# Patient Record
Sex: Male | Born: 1965 | Race: Black or African American | Hispanic: No | Marital: Single | Smoking: Former smoker
Health system: Southern US, Community
[De-identification: ages and names within clinical notes are randomized; demographics above are authoritative.]

## PROBLEM LIST (undated history)

## (undated) DIAGNOSIS — N189 Chronic kidney disease, unspecified: Secondary | ICD-10-CM

## (undated) DIAGNOSIS — F329 Major depressive disorder, single episode, unspecified: Secondary | ICD-10-CM

## (undated) DIAGNOSIS — I499 Cardiac arrhythmia, unspecified: Secondary | ICD-10-CM

## (undated) DIAGNOSIS — F419 Anxiety disorder, unspecified: Secondary | ICD-10-CM

## (undated) DIAGNOSIS — E349 Endocrine disorder, unspecified: Secondary | ICD-10-CM

## (undated) DIAGNOSIS — IMO0001 Reserved for inherently not codable concepts without codable children: Secondary | ICD-10-CM

## (undated) DIAGNOSIS — M1990 Unspecified osteoarthritis, unspecified site: Secondary | ICD-10-CM

## (undated) DIAGNOSIS — I519 Heart disease, unspecified: Secondary | ICD-10-CM

## (undated) DIAGNOSIS — K759 Inflammatory liver disease, unspecified: Secondary | ICD-10-CM

## (undated) DIAGNOSIS — G4730 Sleep apnea, unspecified: Secondary | ICD-10-CM

## (undated) DIAGNOSIS — R569 Unspecified convulsions: Secondary | ICD-10-CM

## (undated) DIAGNOSIS — G5600 Carpal tunnel syndrome, unspecified upper limb: Secondary | ICD-10-CM

## (undated) DIAGNOSIS — F32A Depression, unspecified: Secondary | ICD-10-CM

## (undated) DIAGNOSIS — E785 Hyperlipidemia, unspecified: Secondary | ICD-10-CM

## (undated) DIAGNOSIS — Z973 Presence of spectacles and contact lenses: Secondary | ICD-10-CM

## (undated) DIAGNOSIS — K219 Gastro-esophageal reflux disease without esophagitis: Secondary | ICD-10-CM

## (undated) DIAGNOSIS — G63 Polyneuropathy in diseases classified elsewhere: Secondary | ICD-10-CM

## (undated) DIAGNOSIS — K5900 Constipation, unspecified: Secondary | ICD-10-CM

## (undated) DIAGNOSIS — R011 Cardiac murmur, unspecified: Secondary | ICD-10-CM

## (undated) DIAGNOSIS — I10 Essential (primary) hypertension: Secondary | ICD-10-CM

## (undated) HISTORY — DX: Hyperlipidemia, unspecified: E78.5

## (undated) HISTORY — PX: COLONOSCOPY: SHX174

## (undated) HISTORY — PX: CATARACT EXTRACTION: SUR2

## (undated) HISTORY — PX: PROSTATE BIOPSY: SHX241

## (undated) HISTORY — DX: Chronic kidney disease, unspecified: N18.9

---

## 1998-04-06 HISTORY — PX: KNEE ARTHROSCOPY W/ MENISCAL REPAIR: SHX1877

## 1999-08-25 ENCOUNTER — Ambulatory Visit (HOSPITAL_COMMUNITY): Admission: RE | Admit: 1999-08-25 | Discharge: 1999-08-25 | Payer: Self-pay | Admitting: Neurosurgery

## 1999-08-25 ENCOUNTER — Encounter: Payer: Self-pay | Admitting: Neurosurgery

## 1999-09-08 ENCOUNTER — Encounter: Payer: Self-pay | Admitting: Neurosurgery

## 1999-09-08 ENCOUNTER — Ambulatory Visit (HOSPITAL_COMMUNITY): Admission: RE | Admit: 1999-09-08 | Discharge: 1999-09-08 | Payer: Self-pay | Admitting: Neurosurgery

## 1999-11-10 ENCOUNTER — Encounter: Payer: Self-pay | Admitting: Neurosurgery

## 1999-11-12 ENCOUNTER — Observation Stay (HOSPITAL_COMMUNITY): Admission: RE | Admit: 1999-11-12 | Discharge: 1999-11-13 | Payer: Self-pay | Admitting: Neurosurgery

## 1999-11-12 ENCOUNTER — Encounter: Payer: Self-pay | Admitting: Neurosurgery

## 2000-04-06 HISTORY — PX: LUMBAR LAMINECTOMY: SHX95

## 2002-12-04 ENCOUNTER — Encounter: Admission: RE | Admit: 2002-12-04 | Discharge: 2003-03-04 | Payer: Self-pay | Admitting: Family Medicine

## 2003-04-04 ENCOUNTER — Encounter (INDEPENDENT_AMBULATORY_CARE_PROVIDER_SITE_OTHER): Payer: Self-pay | Admitting: Specialist

## 2003-04-04 ENCOUNTER — Ambulatory Visit (HOSPITAL_COMMUNITY): Admission: RE | Admit: 2003-04-04 | Discharge: 2003-04-04 | Payer: Self-pay | Admitting: Internal Medicine

## 2003-12-28 ENCOUNTER — Ambulatory Visit: Payer: Self-pay | Admitting: *Deleted

## 2007-04-18 ENCOUNTER — Ambulatory Visit: Payer: Self-pay | Admitting: Gastroenterology

## 2007-06-02 ENCOUNTER — Encounter: Admission: RE | Admit: 2007-06-02 | Discharge: 2007-06-02 | Payer: Self-pay | Admitting: Orthopedic Surgery

## 2007-06-22 ENCOUNTER — Ambulatory Visit (HOSPITAL_COMMUNITY): Admission: RE | Admit: 2007-06-22 | Discharge: 2007-06-22 | Payer: Self-pay | Admitting: Orthopedic Surgery

## 2007-07-12 ENCOUNTER — Encounter: Admission: RE | Admit: 2007-07-12 | Discharge: 2007-07-28 | Payer: Self-pay | Admitting: Orthopedic Surgery

## 2008-11-08 ENCOUNTER — Ambulatory Visit: Payer: Self-pay | Admitting: Gastroenterology

## 2008-11-14 ENCOUNTER — Ambulatory Visit (HOSPITAL_COMMUNITY): Admission: RE | Admit: 2008-11-14 | Discharge: 2008-11-14 | Payer: Self-pay | Admitting: Gastroenterology

## 2008-12-11 ENCOUNTER — Ambulatory Visit: Payer: Self-pay | Admitting: Gastroenterology

## 2009-05-14 ENCOUNTER — Ambulatory Visit: Payer: Self-pay | Admitting: Psychiatry

## 2009-09-17 ENCOUNTER — Encounter: Admission: RE | Admit: 2009-09-17 | Discharge: 2009-09-17 | Payer: Self-pay | Admitting: Gastroenterology

## 2010-07-12 LAB — CREATININE, SERUM
Creatinine, Ser: 0.66 mg/dL (ref 0.4–1.5)
GFR calc Af Amer: >60 mL/min (ref >60)
GFR calc non Af Amer: >60 mL/min (ref >60)

## 2010-07-12 LAB — BUN: BUN: 8 mg/dL (ref 6–23)

## 2010-08-19 NOTE — Op Note (Signed)
NAME:  [name], [name]               ACCOUNT NO.:  [id number]   MEDICAL RECORD NO.:  [id number]          PATIENT TYPE:  AMB   LOCATION:  SDS                          FACILITY:  [facility]   PHYSICIAN:  Marily Memos, MD      DATE OF BIRTH:  June 08, 1965   DATE OF PROCEDURE:  06/22/2007  DATE OF DISCHARGE:                               OPERATIVE REPORT   PREOPERATIVE DIAGNOSIS:  Internal derangement left knee.   POSTOPERATIVE DIAGNOSIS:  Loose bodies left knee, chondral defect medial  and lateral femoral condyle, fraying of lateral meniscus and chronic  synovitis with a patella plica.   SURGEON:  Marily Memos, MD   ANESTHESIA:  General.   PROCEDURE:  Arthroscopic complete synovectomy and excision of plica,  chondroplasty of the lateral femoral condyle and medial femoral condyle  and removal of loose bodies.   PROCEDURE IN DETAIL:  The patient was taken to the operating room after  given adequate preop medications and given general anesthesia,  intubated.  The left knee was prepped with DuraPrep and draped in a  sterile manner.  Tourniquet used for hemostasis.  One half inch puncture  wound made along the anterior medial and lateral joint lines.  Inflow  was through the medial and suprapatellar pouch area.  Inspection of the  joint revealed a hyperemic synovial lining both medial and lateral  compartment with large plica anterior.  Chondral defect involving the  medial border to medial femoral condyle and the patellofemoral surface  of the femur about the lateral condyle.  The common defect in the  lateral femoral condyle was approximately the size of a 25 cent piece.  With the synovial shaver complete synovectomy was done with excision of  the patellar plica.  Loose bodies about the lateral compartment was also  identified and removed.  Inspection of the medial meniscus revealed it  was well-preserved.  There was a tear of the medial border of the medial  femoral condyle.  This was  shaved and then smoothed down.  Inspection of  the lateral meniscus only revealed fraying about the peripheral edge of  the medial border of the lateral meniscus.  ACL was intact.  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  Chondroplasty was done of the lateral femoral condylar defect as  well as the medial femoral condylar defect.  Further inspection did not  reveal any other loose fragments.  The joint was then closed with 4-0  nylon and 20 mL of quarter percent Marcaine was placed into the joint.  Compressive dressing was applied.  The patient tolerated the procedure  quite well and went to the recovery room on Percocet one to two q. 4  p.r.n. for pain and partial weightbearing left knee with use of  crutches, ice packs and elevation.  Return to the office in 1 week.  The  patient is being discharged in stable and satisfactory condition.      Marily Memos, MD  Electronically Signed     AC/MEDQ  D:  06/22/2007  T:  06/22/2007  Job:  [id number] 

## 2010-08-22 NOTE — H&P (Signed)
[place]. [facility]  Patient:    [name], [name]                      MRN: [id number] Adm. Date:  [id number] Attending:  Ashok Pall                         History and Physical  ADMISSION DIAGNOSIS:  Left lateral disk herniation L5-S1.  HISTORY OF PRESENT ILLNESS:  [name] is a 45 year old gentleman who while bending over the cash register July 14, 1999, had extreme and acute pain in the left lower extremity and buttocks. The pain is still quite significant. He initially presented to my office Aug 07, 1999. The pain feels more like a cramp. He had already been treated with a prednisone taper, hydrocodone, and Skelaxin. An MRI was done which showed a lateral disk herniation at L5-S1 on the left. [name] decided at that point in time to undergo epidural steroid injections as a first attempt to treat the pain. They, unfortunately, were of no benefit. He was taking 30 mg of OxyContin a day, and I felt that that was not a tenable solution. At that point in time, I recommended and he agreed to undergo a lumbar laminectomy and diskectomy at L5-S1 for a lateral disk.  PAST MEDICAL HISTORY:  Excellent.  REVIEW OF SYSTEMS:  Positive for eye glasses, leg pain, constipation, leg weakness, difficulty concentrating. He denies constitutional, cardiovascular, respiratory, gastrointestinal, genitourinary, skin, neurological, psychiatric, endocrine, or hematologic problems.  FAMILY HISTORY:  Father age 35 is deceased. He did have heart disease, emphysema, asthma, an enlarged liver, and an enlarged heart. Mother has diabetes and Addisons disease.  SOCIAL HISTORY:  He smokes one pack of cigarettes a day. Drinks alcohol socially.  PHYSICAL EXAMINATION:  GENERAL:  He is alert and oriented x 4 and answers all questions appropriately.  VITAL SIGNS:  He is 5 feet 11 inches tall, weighs 290 pounds.  NEUROLOGICAL:  Deep tendon reflexes 2+ at the knees, 1+ at the  ankles bilaterally. Positive straight leg raising on the left side. Negative Patricks maneuver. Pinprick decreased on the left L5 distribution.  EXTREMITIES:  Good strength in the lower extremities. He could toe walk, heel walk, do deep knee bends.  IMPRESSION:  Displaced disk L5-S1.  PLAN:  Risks of the procedure including bleeding, infection, no pain relief, worsened pain, need for fusion, leg weakness, CSF leak, recurrent disk work were explained. He understands and wishes to proceed.DD:  11/12/99 TD:  11/12/99 Job: [id number]

## 2010-08-22 NOTE — Op Note (Signed)
[place]. [facility], [place]  Patient:    JAHMAIR, CARRAHER                      MRN: [id number] Proc. Date: 11/12/99 Adm. Date:  [id number] Attending:  Ashok Pall                           Operative Report  PREOPERATIVE DIAGNOSES: 1. Lateral displaced disk, left, L5-S1. 2. Left L5 radiculopathy.  POSTOPERATIVE DIAGNOSES: 1. Lateral displaced disk, left, L5-S1. 2. Left L5 radiculopathy.  PROCEDURE:  Far lateral diskectomy L5-S1 with superior facetectomy L5-S1 and microdissection.  SURGEON:  Kyle L. Christella Noa, M.D.  ASSISTANT:  Hosie Spangle, M.D.  COMPLICATIONS:  None.  ANESTHESIA:  General endotracheal.  INDICATIONS FOR PROCEDURE:  Dwayne Faye is a 45 year old gentleman who has had pain in his left lower extremity since July 14, 1999.  The pain did not subside with conservative treatment, including epidural steroids.  I recommended and he agreed to undergo lumbar laminectomy, diskectomy at L5-S1 on the left side for far lateral disk herniation.  DESCRIPTION OF PROCEDURE:  Mr. Hepner is brought to the operating room, intubated, placed under general anesthesia without difficulty.  I placed a needle prior to prepping to localize my skin incision with an x-ray.  Using that as a guide, the patient was then prepped and draped in a sterile fashion.  I made a skin incision with a #10 blade and took this down through the subcutaneous fascia, and a significant amount of subcutaneous fat.  Then proceeded to identify the spinous processes and reflected the paraspinous musculature off of what I believed to be the L5 lamina.  I took another x-ray after placing a double-ended ganglion knife inferior to the lamina of L5. X-ray showed I was at the correct position.  I then moved just anterior to the facet of L5-S1, and identified the pars interarticularis.  Doing that, I was able to place a retractor and drill out the lateral aspect of the pars.  Did not broach  the pedicle, and performed a superior facectomy at L5-S1.  I was able to then get through the soft tissue and identify the left L5 nerve root. With Dr. Carrie Mew assistance we were able to remove more bone on the facet and found a large piece of disk which was medial, which was inferior to the nerve root.  We were able to remove that without difficulty.  I then inspected the disk, the area around the nerve root, and felt that there were no more free pieces.  Did not explore the disk space with the pituitary rongeurs.  The disk was desiccated and this was in the far lateral position, and did not want to place the rongeur beyond our line of vision.  Therefore, irrigated the wound.  Placed Depo-Medrol over the resection site. Then closed the wound in layer fashion, reapproximated the thoracolumbar fascia initially.  Then the subcutaneous tissue, then finally the skin. Placed Steri-Strips, sterile dressing.  The patient was then rolled supine and was extubated, moving all extremities. DD:  11/12/99 TD:  11/13/99 Job: [id number]

## 2010-10-17 ENCOUNTER — Encounter: Payer: Managed Care, Other (non HMO) | Attending: Neurosurgery | Admitting: Neurosurgery

## 2010-10-17 DIAGNOSIS — G8929 Other chronic pain: Secondary | ICD-10-CM | POA: Insufficient documentation

## 2010-10-17 DIAGNOSIS — M545 Low back pain, unspecified: Secondary | ICD-10-CM | POA: Insufficient documentation

## 2010-10-17 DIAGNOSIS — E119 Type 2 diabetes mellitus without complications: Secondary | ICD-10-CM | POA: Insufficient documentation

## 2010-10-17 DIAGNOSIS — I10 Essential (primary) hypertension: Secondary | ICD-10-CM | POA: Insufficient documentation

## 2010-10-17 DIAGNOSIS — M961 Postlaminectomy syndrome, not elsewhere classified: Secondary | ICD-10-CM

## 2010-10-17 DIAGNOSIS — Z79899 Other long term (current) drug therapy: Secondary | ICD-10-CM | POA: Insufficient documentation

## 2010-10-17 DIAGNOSIS — M129 Arthropathy, unspecified: Secondary | ICD-10-CM | POA: Insufficient documentation

## 2010-10-17 DIAGNOSIS — R52 Pain, unspecified: Secondary | ICD-10-CM | POA: Insufficient documentation

## 2010-10-17 DIAGNOSIS — M5430 Sciatica, unspecified side: Secondary | ICD-10-CM

## 2010-10-18 NOTE — Progress Notes (Signed)
Account [id number].  [name] is the patient referred here by his primary care doctor, Dr. Iona Beard.  The patient has complaints mainly of low back pain with some left radicular symptoms.  He states that he had back surgery with Dr. Ashok Pall in 2002, he had an L3 herniated disk at that time and has had little problems with it until late February, he was moving and he feels like at that time he might have injured himself and the pain has progressively gotten worse.  He rates his pain as 7, it is a sharp pain with stabbing sensation.  He also has a paresthesia-like feeling in his left leg.  General activity is 5-8, and pain is worse at night, sleep patterns are poor.  Sitting and inactivity aggravate.  Medication tends to help.  He can walk without assistance and he can walk about 15 minutes and he climb steps, drives.  He does work 40 hours a week in [employer], children[employer].  REVIEW OF SYSTEMS:  Notable for those difficulties described above as well as some dizziness, anxiety, depression.  No suicidal thoughts. Night sweats, blood sugar fluctuations, nausea, constipation, abdominal pain, poor appetite.  He does see Dr. Iona Beard as well as Dr. Carol Ada.  PAST MEDICAL HISTORY:  Significant for hypertension, arthritis, diabetes, liver disease, stomach, and intestinal problems.  He had knee surgery in 2008 and back surgery in 2002.  SOCIAL HISTORY:  He is single, lives with his partner and 2 sons.  FAMILY HISTORY:  Significant heart disease, lung disease, diabetes, hypertension, alcohol abuse, drug abuse, and disability.  PHYSICAL EXAMINATION:  VITAL SIGNS:  His blood pressure is 98/58, pulse 135, respirations 20, O2 sats 98 on room air.  His strength 5/5 in the upper and lower extremities including deltoid, biceps, triceps, intrinsics, grip, iliopsoas, quadriceps, dorsiflexors, and plantar flexors.  His sensation is intact to pinprick in the upper and lower extremities.  He has positive  straight leg raise on the left. His deep tendon reflexes are minimal in the upper extremities including the deltoid, biceps, triceps, biceps, and brachioradialis.  He has trace at the quadriceps, absent at the gastrocs.  MEDICATIONS:  His current medication list: 1. Diovan 1 mL/12.5 daily. 2. Gabapentin 100 mg daily. 3. Tramadol 50 mg twice a day. 4. Metformin 1000 mg twice a day. 5. Cymbalta 30 mg daily. 6. Fish oil 1000 units twice daily.  ASSESSMENT: 1. Acute on chronic low back pain. 2. Postlaminectomy syndrome. 3. Question herniated nucleus pulposus in the L3-4 dermatome, possibly     L5-S1.  He cannot really isolate the pain in his leg to one     particular area.  PLAN:  I am going to place him on Mobic 15 mg a day and a Medrol Dosepak 6-day dose pack 10 mg.  He is unable to urinate and he did not complete his urine drug screen and he will return Monday first thing in the morning to pick up prescriptions and do the drug screen.  We are going to obtain an MRI of the lumbar spine with and without gadolinium to rule out the need for possible referral back to Dr. Christella Noa.  He is in agreement with the plan.  His questions were encouraged and answered.  We will see him Monday morning for his drug screen.     Anija Brickner L. Blenda Nicely Electronically Signed    RLW/MedQ D:  10/17/2010 15:15:10  T:  10/18/2010 02:51:51  Job #:  [id number]

## 2010-10-20 ENCOUNTER — Other Ambulatory Visit: Payer: Self-pay | Admitting: Physical Medicine & Rehabilitation

## 2010-10-20 DIAGNOSIS — M545 Low back pain, unspecified: Secondary | ICD-10-CM

## 2010-10-20 DIAGNOSIS — M5410 Radiculopathy, site unspecified: Secondary | ICD-10-CM

## 2010-10-21 ENCOUNTER — Inpatient Hospital Stay (HOSPITAL_COMMUNITY)
Admission: EM | Admit: 2010-10-21 | Discharge: 2010-10-24 | DRG: 312 | Disposition: A | Discharge status: Home / Self Care | Payer: Managed Care, Other (non HMO) | Attending: Internal Medicine | Admitting: Internal Medicine

## 2010-10-21 ENCOUNTER — Encounter (HOSPITAL_COMMUNITY): Payer: Self-pay | Admitting: Radiology

## 2010-10-21 ENCOUNTER — Emergency Department (HOSPITAL_COMMUNITY): Payer: Managed Care, Other (non HMO)

## 2010-10-21 DIAGNOSIS — E872 Acidosis, unspecified: Secondary | ICD-10-CM | POA: Diagnosis present

## 2010-10-21 DIAGNOSIS — I951 Orthostatic hypotension: Principal | ICD-10-CM | POA: Diagnosis present

## 2010-10-21 DIAGNOSIS — E1149 Type 2 diabetes mellitus with other diabetic neurological complication: Secondary | ICD-10-CM | POA: Diagnosis present

## 2010-10-21 DIAGNOSIS — E785 Hyperlipidemia, unspecified: Secondary | ICD-10-CM | POA: Diagnosis present

## 2010-10-21 DIAGNOSIS — Z794 Long term (current) use of insulin: Secondary | ICD-10-CM

## 2010-10-21 DIAGNOSIS — Z79899 Other long term (current) drug therapy: Secondary | ICD-10-CM

## 2010-10-21 DIAGNOSIS — I10 Essential (primary) hypertension: Secondary | ICD-10-CM | POA: Diagnosis present

## 2010-10-21 DIAGNOSIS — E860 Dehydration: Secondary | ICD-10-CM | POA: Diagnosis present

## 2010-10-21 DIAGNOSIS — I498 Other specified cardiac arrhythmias: Secondary | ICD-10-CM | POA: Diagnosis present

## 2010-10-21 DIAGNOSIS — E1142 Type 2 diabetes mellitus with diabetic polyneuropathy: Secondary | ICD-10-CM | POA: Diagnosis present

## 2010-10-21 HISTORY — DX: Essential (primary) hypertension: I10

## 2010-10-21 LAB — URINALYSIS, ROUTINE W REFLEX MICROSCOPIC
Hgb urine dipstick: NEGATIVE
Ketones, ur: 15 mg/dL — AB
Specific Gravity, Urine: 1.017 (ref 1.005–1.030)
Urobilinogen, UA: 1 mg/dL (ref 0.0–1.0)
pH: 6 (ref 5.0–8.0)

## 2010-10-21 LAB — COMPREHENSIVE METABOLIC PANEL
AST: 27 U/L (ref 0–37)
Albumin: 3.5 g/dL (ref 3.5–5.2)
Alkaline Phosphatase: 58 U/L (ref 39–117)
Calcium: 9.4 mg/dL (ref 8.4–10.5)
Creatinine, Ser: 1.07 mg/dL (ref 0.50–1.35)
GFR calc non Af Amer: >60 mL/min (ref >60)
Glucose, Bld: 251 mg/dL — ABNORMAL HIGH (ref 70–99)
Potassium: 4.7 mEq/L (ref 3.5–5.1)
Sodium: 131 mEq/L — ABNORMAL LOW (ref 135–145)
Total Protein: 8.1 g/dL (ref 6.0–8.3)

## 2010-10-21 LAB — DIFFERENTIAL
Basophils Absolute: 0 10*3/uL (ref 0.0–0.1)
Basophils Relative: 0 % (ref 0–1)
Eosinophils Absolute: 0 10*3/uL (ref 0.0–0.7)
Eosinophils Relative: 0 % (ref 0–5)
Lymphocytes Relative: 58 % — ABNORMAL HIGH (ref 12–46)
Lymphs Abs: 7 10*3/uL — ABNORMAL HIGH (ref 0.7–4.0)
Monocytes Absolute: 0.7 10*3/uL (ref 0.1–1.0)
Monocytes Relative: 6 % (ref 3–12)
Neutrophils Relative %: 36 % — ABNORMAL LOW (ref 43–77)

## 2010-10-21 LAB — CBC
HCT: 48.5 % (ref 39.0–52.0)
MCHC: 36.5 g/dL — ABNORMAL HIGH (ref 30.0–36.0)
MCV: 82.5 fL (ref 78.0–100.0)
Platelets: 191 10*3/uL (ref 150–400)
RBC: 5.88 MIL/uL — ABNORMAL HIGH (ref 4.22–5.81)
RDW: 12.5 % (ref 11.5–15.5)
WBC: 12 10*3/uL — ABNORMAL HIGH (ref 4.0–10.5)

## 2010-10-21 LAB — TYPE AND SCREEN: ABO/RH(D): A POS

## 2010-10-21 LAB — TROPONIN I: Troponin I: <0.3 ng/mL (ref <0.30)

## 2010-10-21 LAB — CK TOTAL AND CKMB (NOT AT ARMC)
CK, MB: 1.5 ng/mL (ref 0.3–4.0)
Relative Index: INVALID (ref 0.0–2.5)
Total CK: 39 U/L (ref 7–232)

## 2010-10-21 LAB — OCCULT BLOOD, POC DEVICE: Fecal Occult Bld: NEGATIVE

## 2010-10-21 LAB — GLUCOSE, CAPILLARY: Glucose-Capillary: 267 mg/dL — ABNORMAL HIGH (ref 70–99)

## 2010-10-21 LAB — ABO/RH: ABO/RH(D): A POS

## 2010-10-21 MED ORDER — IOHEXOL 350 MG/ML SOLN
150.0000 mL | Freq: Once | INTRAVENOUS | Status: AC | PRN
  Administered 2010-10-21: 150 mL via INTRAVENOUS

## 2010-10-22 DIAGNOSIS — I517 Cardiomegaly: Secondary | ICD-10-CM

## 2010-10-22 LAB — T4, FREE: Free T4: 1.4 ng/dL (ref 0.80–1.80)

## 2010-10-22 LAB — GLUCOSE, CAPILLARY: Glucose-Capillary: 218 mg/dL — ABNORMAL HIGH (ref 70–99)

## 2010-10-22 LAB — CBC
HCT: 42.6 % (ref 39.0–52.0)
Hemoglobin: 15.4 g/dL (ref 13.0–17.0)
MCH: 29.7 pg (ref 26.0–34.0)
MCHC: 36.2 g/dL — ABNORMAL HIGH (ref 30.0–36.0)
MCV: 82.2 fL (ref 78.0–100.0)

## 2010-10-22 LAB — CARDIAC PANEL(CRET KIN+CKTOT+MB+TROPI)
CK, MB: 1.9 ng/mL (ref 0.3–4.0)
CK, MB: 1.9 ng/mL (ref 0.3–4.0)
Relative Index: INVALID (ref 0.0–2.5)
Total CK: 55 U/L (ref 7–232)
Total CK: 59 U/L (ref 7–232)
Troponin I: <0.3 ng/mL (ref <0.30)

## 2010-10-22 LAB — CORTISOL: Cortisol, Plasma: 5.6 ug/dL

## 2010-10-22 LAB — LACTIC ACID, PLASMA: Lactic Acid, Venous: 2.2 mmol/L (ref 0.5–2.2)

## 2010-10-22 LAB — LIPID PANEL
Total CHOL/HDL Ratio: 4.9 RATIO
VLDL: 15 mg/dL (ref 0–40)

## 2010-10-22 LAB — DIFFERENTIAL
Basophils Absolute: 0 10*3/uL (ref 0.0–0.1)
Basophils Relative: 0 % (ref 0–1)
Eosinophils Relative: 0 % (ref 0–5)
Lymphocytes Relative: 50 % — ABNORMAL HIGH (ref 12–46)
Monocytes Absolute: 0.5 10*3/uL (ref 0.1–1.0)
Neutro Abs: 4 10*3/uL (ref 1.7–7.7)

## 2010-10-22 LAB — VITAMIN B12: Vitamin B-12: 1631 pg/mL — ABNORMAL HIGH (ref 211–911)

## 2010-10-22 LAB — FOLATE: Folate: 13.8 ng/mL

## 2010-10-22 LAB — TSH: TSH: 1.16 u[IU]/mL (ref 0.350–4.500)

## 2010-10-23 ENCOUNTER — Inpatient Hospital Stay (HOSPITAL_COMMUNITY): Payer: Managed Care, Other (non HMO)

## 2010-10-23 LAB — GLUCOSE, CAPILLARY: Glucose-Capillary: 241 mg/dL — ABNORMAL HIGH (ref 70–99)

## 2010-10-23 LAB — CBC
HCT: 38.5 % — ABNORMAL LOW (ref 39.0–52.0)
MCHC: 35.3 g/dL (ref 30.0–36.0)
RDW: 12.1 % (ref 11.5–15.5)

## 2010-10-23 LAB — BASIC METABOLIC PANEL
CO2: 26 mEq/L (ref 19–32)
Calcium: 9 mg/dL (ref 8.4–10.5)
Creatinine, Ser: 0.5 mg/dL (ref 0.50–1.35)
Potassium: 3.3 mEq/L — ABNORMAL LOW (ref 3.5–5.1)
Sodium: 132 mEq/L — ABNORMAL LOW (ref 135–145)

## 2010-10-23 LAB — C-REACTIVE PROTEIN: CRP: 0.6 mg/dL — ABNORMAL HIGH (ref <0.6)

## 2010-10-23 NOTE — H&P (Signed)
[name], [name]               ACCOUNT NO.:  [id number]  MEDICAL RECORD NO.:  [id number]  LOCATION:  [facility]                         FACILITY:  [facility]  PHYSICIAN:  Thornton Dales, MD   DATE OF BIRTH:  [birth year]  DATE OF ADMISSION:  10/21/2010 DATE OF DISCHARGE:                             HISTORY & PHYSICAL   PRIMARY CARE PHYSICIAN:  Dr. Kennon Holter.  CHIEF COMPLAINT:  Dizziness and weakness.  HISTORY OF PRESENT ILLNESS:  This is a 45 year old African American gentleman who has been feeling dizzy and weak for the past one week accompanied with nausea and generalized malaise.  He denied any fever, chest pain, cough, or shortness of breath.  He had some palpitations accompanying with his dizziness prompting him to go to his primary care physician's office today.  While at his doctor's office, he was found to be tachycardic with heart rate of about 140 and also borderline hypertensive with systolic blood pressure 0000000.  The patient was subsequently sent to the emergency room where on arrival his blood pressure was 90/57.  His heart rate was, however, 138.  He got some fluids which improved his blood pressure, but he remained tachycardic. The patient does not have any abdominal pain or urinary symptoms.  His workup in the emergency room showed mild leukocytosis with mild lactic acidosis of 2.9, otherwise workup was unremarkable.  PAST MEDICAL HISTORY:  High blood pressure, type 2 diabetes mellitus, also has a history of neuropathy.  MEDICATION:  Metformin, gabapentin, tramadol, Diovan, and Cymbalta.  ALLERGIES:  None.  SOCIAL HISTORY:  No smoking, alcohol, or drugs.  FAMILY HISTORY:  Mother died of pancreatic cancer 2 years ago.  REVIEW OF SYSTEMS:  Ten-point review of systems negative except as described above.  PHYSICAL EXAMINATION:  GENERAL:  The patient is awake, alert, oriented, in no distress. VITAL SIGNS:  Blood pressure currently 140/86, pulse 120-126, respirations  20, and temperature 98.6. HEAD:  Atraumatic. NECK:  Supple.  No JVD, adenopathy, or thyromegaly. MOUTH:  Moist. LUNGS:  Clear breath sounds bilaterally to auscultation.  No wheezing or crackles. HEART:  S1 and S2.  No murmurs, rubs, or gallops. ABDOMEN:  Full, soft, nontender.  Bowel sounds present.  No masses. EXTREMITIES:  No edema, clubbing, or cyanosis. LYMPHATICS:  No lingular swelling. PSYCHIATRIC:  Normal mood and affect. SKIN:  No rash or lesion.  LABORATORY:  White count 12.2, troponin 0 with no left shift, hemoglobin 17.7, and platelet count is 191.  Chemistry, sodium is 131, potassium 4.7, BUN 18, and creatinine of 1.07.  Liver enzymes are normal.  Cardiac enzymes x1 is normal.  Urinalysis unremarkable.  The patient had imaging workup including CT angiography of the chest with contrast which showed no evidence of pulmonary embolism.  Evidence of LAD, calcified coronary arteriosclerosis to the left upper lobe.  CT of the abdomen was negative for any acute findings.  EKG shows sinus tachycardia about _________  ASSESSMENT:  This is a 45 year old man presenting with weakness, dizziness, borderline hypotension, sinus tachycardia.  In view of borderline leukocytosis, mild lactic acidosis, low-grade sepsis is a possible differential. 1. Wishes to rule out thyroid disease. 2. Palpation with sinus tachycardia. 3. Diabetes  mellitus, currently controlled.  PLAN:  Admit as observation to telemetry.  Send blood cultures.  Start empiric antibiotics.  Followup lactic acidosis.  Check hemoglobin A1c. Give gentle IV fluids.  Check thyroid function tests including TSH and total T4.  The patient can be discharged once vital signs remain stable and cultures are negative.     Thornton Dales, MD     FA/MEDQ  D:  10/22/2010  T:  10/22/2010  Job:  [id number]  Electronically Signed by Thornton Dales  on 10/23/2010 02:46:07 AM

## 2010-10-24 ENCOUNTER — Other Ambulatory Visit: Payer: Managed Care, Other (non HMO)

## 2010-10-24 LAB — CBC
HCT: 37.5 % — ABNORMAL LOW (ref 39.0–52.0)
Hemoglobin: 13.1 g/dL (ref 13.0–17.0)
MCH: 29 pg (ref 26.0–34.0)
MCV: 83 fL (ref 78.0–100.0)
Platelets: 112 10*3/uL — ABNORMAL LOW (ref 150–400)
RBC: 4.52 MIL/uL (ref 4.22–5.81)
WBC: 6.4 10*3/uL (ref 4.0–10.5)

## 2010-10-24 LAB — BASIC METABOLIC PANEL
BUN: 9 mg/dL (ref 6–23)
CO2: 24 mEq/L (ref 19–32)
Chloride: 102 mEq/L (ref 96–112)
Creatinine, Ser: 0.6 mg/dL (ref 0.50–1.35)
GFR calc Af Amer: >60 mL/min (ref >60)
Glucose, Bld: 228 mg/dL — ABNORMAL HIGH (ref 70–99)
Potassium: 4.3 mEq/L (ref 3.5–5.1)

## 2010-10-24 LAB — GLUCOSE, CAPILLARY: Glucose-Capillary: 181 mg/dL — ABNORMAL HIGH (ref 70–99)

## 2010-10-28 LAB — CULTURE, BLOOD (ROUTINE X 2)
Culture  Setup Time: 201207181252
Culture: NO GROWTH
Culture: NO GROWTH

## 2010-10-29 NOTE — Discharge Summary (Signed)
[name], [name]               ACCOUNT NO.:  [id number]  MEDICAL RECORD NO.:  [id number]  LOCATION:  [id number]                         FACILITY:  [facility]  PHYSICIAN:  Estill Cotta, MD       DATE OF BIRTH:  April 14, 1965  DATE OF ADMISSION:  10/21/2010 DATE OF DISCHARGE:                        DISCHARGE SUMMARY - REFERRING   PRIMARY CARE PHYSICIAN:  Barrie Folk. Hill, MD  DISCHARGE DIAGNOSES: 1. Near syncope likely secondary to orthostatic hypotension. 2. Sinus tachycardia likely due to orthostasis. 3. Diabetes mellitus, uncontrolled. 4. Hypertension. 5. Diabetic neuropathy.  CONSULTATIONS:  Cardiology Service, [facility] Dr. Quay Burow.  DISCHARGE MEDICATIONS: 1. Diovan 160 mg p.o. daily. 2. Lantus 33 units subcu daily at bedtime. 3. Metoprolol 25 mg p.o. b.i.d. 4. Crestor 20 mg p.o. daily. 5. Gabapentin 300 mg p.o. at bedtime. 6. Cymbalta 30 mg p.o. daily. 7. Fish oil 1000 mg p.o. b.i.d. 8. Metformin 1000 mg p.o. b.i.d. 9. Tramadol 50 mg 2 tablets p.o. three times daily as needed for pain.  BRIEF HISTORY OF PRESENT ILLNESS:  At the time of admission, [name] is a 45 year old male with history of hypertension, diabetes, history of neuropathy who was feeling dizzy and weak for past one week accompanying with nausea and generalized malaise.  The patient otherwise denied any fever, chest pain, cough or shortness of breath.  He had some palpitations, accompanying with his dizziness, which prompted him to go to his primary care physician.  He was found to have a systolic blood pressure in 85s to 90s and heart rate of 140s in his doctor's office. The patient was subsequently sent to the emergency room where blood pressure was 90/57 and heart rate was 138.  The patient also had some mild lactic acidosis of 2.9.  The patient was admitted for observation and further workup.  RADIOLOGICAL DATA:  Chest x-ray on October 21, 2010, no active cardiopulmonary disease.  CT abdomen and  pelvis with contrast on October 21, 2010 once suboptimal pulmonary artery contrast follows timing despite two attempts, no central or lumbar or pulmonary embolus. 1. More distal branches appeared clear, but are not optimally     evaluated if suspicion persists to be V/Q scan recommended. 2. Evidence of LAD calcified coronary atherosclerosis, negative aorta. 3. No acute pulmonary process, 2 mm left upper lobe subpleural nodule     likely benign and no follow up necessity unless the patient is a     smoker or has other risk factors following cancer. Abdomen and pelvis findings below.  CT of the abdomen and pelvis was negative.  MRI showed no acute abnormality.  MRA of the head showed atherosclerotic disease in the cavernous carotid left greater than right without significant intracranial stenosis.  A 2-D echo showed EF of 60% to 65% normal wall motion.  No regional wall motion abnormalities, great on diastolic dysfunction.  PERTINENT LAB DIAGNOSTIC DATA:  Occult blood negative.  CBC showed white count of 12.0, hemoglobin of 17.7, hematocrit 48.5, platelets 191 at the time of admission, lactic acid 2.9, troponin is negative for any acute ACS.  UA negative for any UTI.  TSH 1.16.  Repeat lactic acid on October 22, 2010 was 2.2  within normal limits.  Lipid profile cholesterol 201, LDL 145, HDL 41.  ESR 45.  HbA1c 13.2 with a mean plasma glucose of 332. Random cortisol level 5.6.  Blood cultures remained negative till date.  BRIEF HOSPITALIZATION COURSE:  [name] is a 45 year old male with history of hypertension, diabetes, poorly controlled presented with weakness, dizziness, hypotension with orthostasis.  The patient was found to have sinus tachycardia in 120s to 130s with hypotension at the time of admission.  Initially, the patient was thought to have SIRS. However, blood cultures and urine cultures remained negative.  He did not have any fevers or infectious source. 1. Orthostatic  hypotension, likely due to dehydration.  The patient's     hemoglobin was also 17 at the time of the admission.  He was     hydrated with IV fluids.  The patient was on Diovan and HCTZ.  HCTZ     was discontinued from his medication profile.  He underwent MRI,     MRA which did not show any stroke.  A 2-D echo was essentially     unremarkable as well.  Due to the patient's continued symptoms,     Cardiology Service was also consulted.  Per Cardiology most likely     his hypotension and tachycardia is related to orthostasis with     dehydration.  He is cleared for discharge to home and recommended     outpatient Myoview stress test.  The patient will follow up with     Dr. Quay Burow in next 1 to 2 weeks of discharge, this was     clearly explained to the patient in great detail. 2. Hyperlipidemia.  The patient was started on Crestor for     hyperlipidemia.  His LDL goal is less than 100. 3. Diabetes mellitus, uncontrolled.  The patient states his HbA1c is     actually better than previously with his primary care physician.     He was already started on Lantus insulin by his primary care     physician which he did not relay at the time of admission.  The     patient will continue Lantus insulin with metformin and follow up     with Dr. Berdine Addison within next week for further adjustments.  PHYSICAL EXAMINATION AT THE TIME OF DISCHARGE:  VITAL SIGNS: Temperature 97.9, pulse 74, respirations 18, blood pressure 113/68, O2 sats 98% on room air. GENERAL:  The patient is alert, awake and oriented x3, not in acute distress. Anicteric sclerae.  Conjunctivae clear.  Pupils are reactive to light and accommodation.  EOMI. NECK:  Supple without lymphopathy. CVS:  S1, S2 clear.  Regular rate and rhythm. CHEST:  Clear to auscultation bilaterally. ABDOMEN:  Soft, nontender, nondistended.  Normal bowel sounds. EXTREMITIES:  No cyanosis, clubbing or edema noted in upper or lower extremities  bilaterally.  DISCHARGE FOLLOW-UP:  With Dr. Quay Burow in 1-2 weeks.  The patient will call for an appointment with Dr. Iona Beard in next 7 to 10 days for hospital followup.  DISCHARGE TIME:  35 minutes.     Estill Cotta, MD     RR/MEDQ  D:  10/24/2010  T:  10/24/2010  Job:  [id number]  cc:   Quay Burow, M.D. Barrie Folk. Berdine Addison, MD  Electronically Signed by Nira Conn Itzae Mccurdy  on 10/29/2010 12:59:18 PM

## 2010-11-21 ENCOUNTER — Encounter
Payer: Managed Care, Other (non HMO) | Attending: Physical Medicine & Rehabilitation | Admitting: Physical Medicine & Rehabilitation

## 2010-11-21 DIAGNOSIS — G8929 Other chronic pain: Secondary | ICD-10-CM | POA: Insufficient documentation

## 2010-11-21 DIAGNOSIS — E860 Dehydration: Secondary | ICD-10-CM | POA: Insufficient documentation

## 2010-11-21 DIAGNOSIS — M545 Low back pain, unspecified: Secondary | ICD-10-CM | POA: Insufficient documentation

## 2010-11-21 DIAGNOSIS — E119 Type 2 diabetes mellitus without complications: Secondary | ICD-10-CM | POA: Insufficient documentation

## 2010-11-21 NOTE — Assessment & Plan Note (Signed)
[name] is back regarding his low back pain.  My nurse practitioner saw him a month ago.  He ordered an MRI, but the patient was admitted to the hospital with tachycardia and low blood pressures.  Apparently, he was found to have some autonomic dysfunction.  He is on a beta-blocker now and doing better.  He is also a bit dehydrated.  His urine drug screen came back positive for marijuana and no further medications were prescribed from our office until follow up here other than Mobic and a steroid Dosepak.  His Neurontin was recently increased to 300 mg at bedtime and he found some relief with this.  Pain averages about 5-6/10 and described as sharp, stabbing, tingling, and aching.  Pain seems to be most prominent over the anterior medial thigh into the leg but also he has some symptoms over to the top of the foot.  Symptoms are less prominent on the posterior leg.  Sleep is poor.  He has some back tightness but not significant pain.  REVIEW OF SYSTEMS:  Notable for depression, spasms, numbness, tingling, and weakness.  Other pertinent positives above and full 12-point review is in the written health and history section of the chart.  SOCIAL HISTORY:  Unchanged.  His is single, living with partner and two teens.  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  PHYSICAL EXAMINATION:  VITAL SIGNS:  Blood pressure is 103/69, pulse 77, respiratory rate 14, and he is saturating 100% on room air. GENERAL:  The patient is pleasant, alert, and oriented x3. NEUROLOGIC:  He is sitting with good posture.  He ambulated for me today and has fair gait with no limp or foot drop that I could see.  Sensory exam was diminished over the L3 and L4 dermatomes of the left leg but inconsistently.  On manual muscle testing, he had some mild weakness which I graded at 4/5 at the left hip abductors, 4-/5 with knee extension, and 4+/5 with ankle dorsiflexion.  Remainder of his  lower extremities exam was 5/5.  I ranged his back today and he had some mild tenderness over the mid lumbar spine, worse slightly with flexion than extension but not severe.  Straight leg testing was equivocal to negative on the left side except for some hamstring tightness. Cognitively, he is alert and appropriate.  Behavior was appropriate. HEART:  Regular. CHEST:  Clear. ABDOMEN:  Soft and nontender.  ASSESSMENT: 1. Chronic low back pain with likely left L3-L4 radiculopathy.  The     patient has a history of a L3 diskectomy by Dr. Christella Noa in 2002.  I     am concerned that he is having further problems in the same area     especially with his weakness. 2. History of diabetes and autonomic dysfunction. 3. Marijuana use.  The patient states that this was only secondhand     smoke and his test was only minimally positive, so this may     corroborate that story.  PLAN: 1. The patient will go for his MRI this week.  He will call the     facility today and have this done as soon as possible.  Once we     have the results, we will discuss future planning. 2. In the meantime, we will increase Neurontin to 300 mg t.i.d. over     the next 4 days.  He will continue with his Mobic for now as well. 3. I told  the patient give him a month to clear out from respect of     his urine test and we will recheck his urine in     about a month.  Any further positive test with marijuana will mean     non-medicinal treatment here in this office.  He claims that he     does not smoke marijuana and this is only secondhand.     Meredith Staggers, M.D. Electronically Signed    ZTS/MedQ D:  11/21/2010 11:15:00  T:  11/21/2010 12:55:11  Job #:  [id number]  cc:   Barrie Folk. Berdine Addison, MD Fax: [phone]

## 2010-12-29 LAB — URINALYSIS, ROUTINE W REFLEX MICROSCOPIC
Hgb urine dipstick: NEGATIVE
Ketones, ur: NEGATIVE
Nitrite: NEGATIVE
Protein, ur: NEGATIVE
Specific Gravity, Urine: 1.022
Urobilinogen, UA: 1

## 2010-12-29 LAB — COMPREHENSIVE METABOLIC PANEL
AST: 111 — ABNORMAL HIGH
CO2: 27
Calcium: 9.5
Chloride: 104
Creatinine, Ser: 0.86
GFR calc Af Amer: >60
GFR calc non Af Amer: >60
Glucose, Bld: 58 — ABNORMAL LOW
Total Bilirubin: 0.9

## 2010-12-29 LAB — CBC
HCT: 48.2
Hemoglobin: 16.6
MCHC: 34.4
MCV: 91.5
Platelets: 128 — ABNORMAL LOW
RBC: 5.26
RDW: 13.6
WBC: 4.9

## 2012-09-19 IMAGING — CT CT ANGIO CHEST
3 of 16 series · 13 of 37 positions shown · IV contrast (CONTRAST)
Comparison: Chest radiograph from the same day and earlier.
Abdominal ultrasound 09/17/2009.

CT ANGIOGRAPHY CHEST WITH CONTRAST

CLINICAL DATA: 45-year-old male with dizziness, weakness
palpitations, shortness of breath.

Contrast:  150 ml Omnipaque (to attempt at PE chest contrast bolus
timing)
TECHNIQUE: Multidetector CT imaging of the chest was performed
using the standard protocol during bolus administration of
intravenous contrast.  Multiplanar CT image reconstructions
including MIPs were obtained to evaluate the vascular anatomy.
TECHNIQUE: Multidetector CT imaging of the abdomen and pelvis was
performed using the standard protocol following bolus
administration of intravenous contrast.

[Series 5: pe thins · axial · 0.80mm/px · z∈[-247,-44]mm · 5 of 305 slices shown (1 of 2)]
[im 51/305  lung]
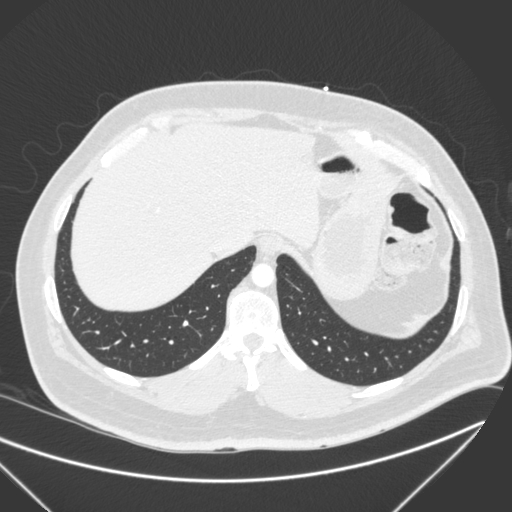
[im 102/305  mediastinal]
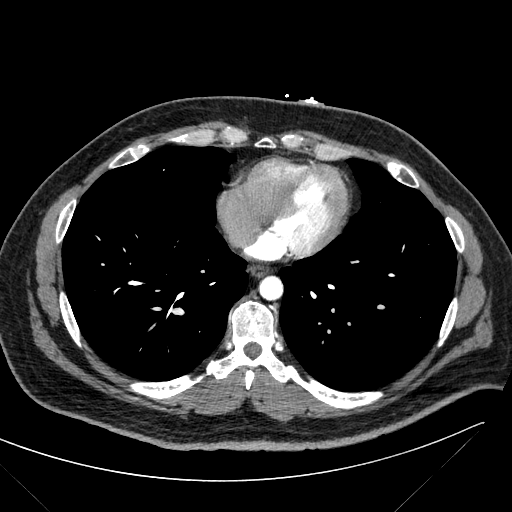
[im 153/305  lung]
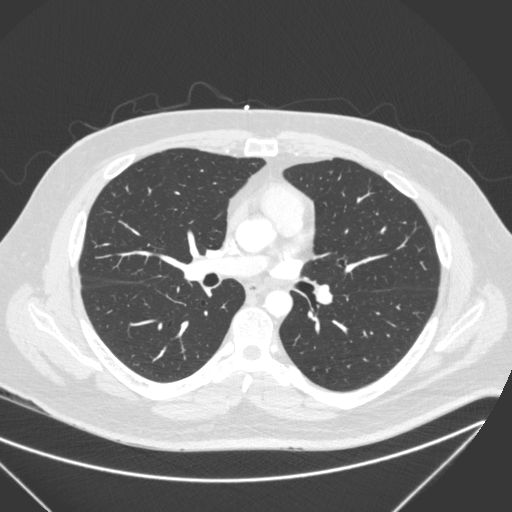
[im 203/305  mediastinal]
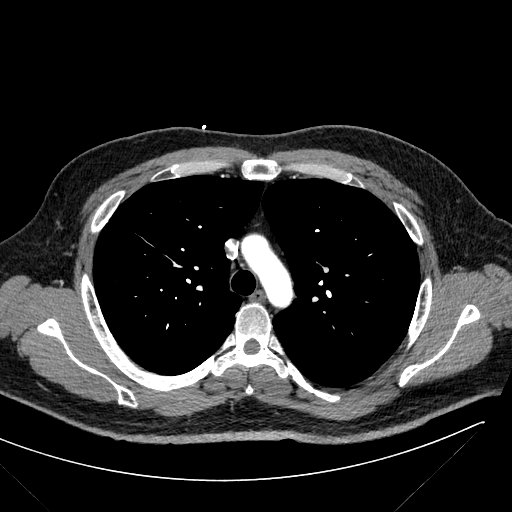
[im 254/305  lung]
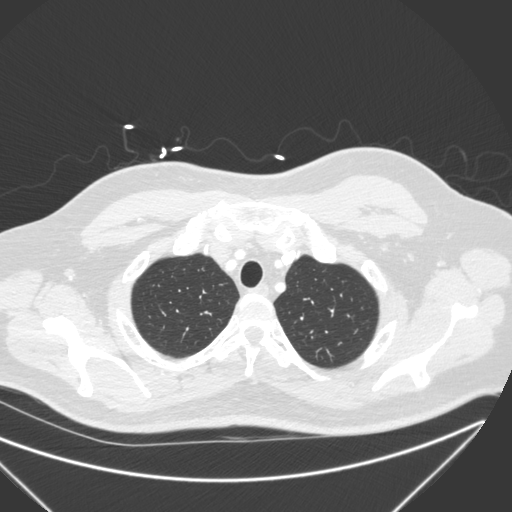

[Series 16: pe · axial · 0.82mm/px · z∈[-193,-97]mm · 2 of 146 slices shown]
[im 49/146  lung]
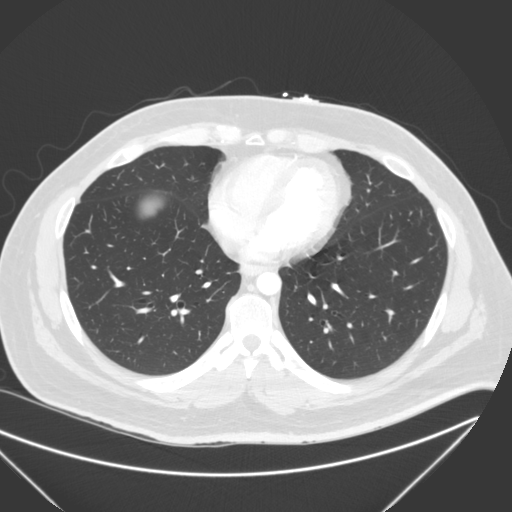
[im 97/146  lung]
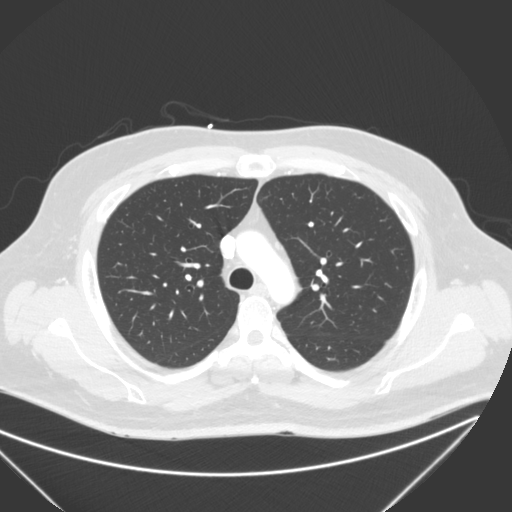

[Series 17: pe thins · axial · 0.82mm/px · z∈[-248,-41]mm · 6 of 291 slices shown (2 of 2)]
[im 42/291  lung]
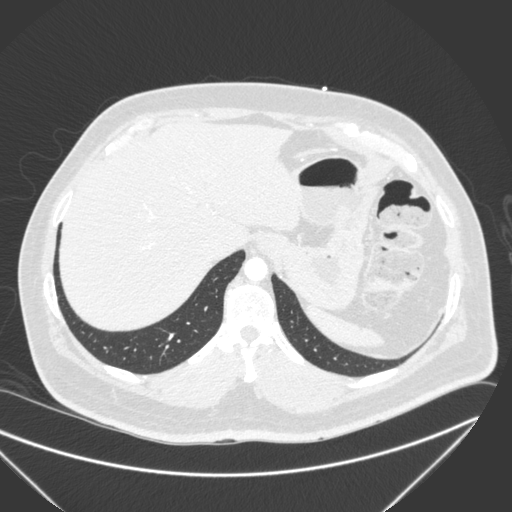
[im 83/291  lung]
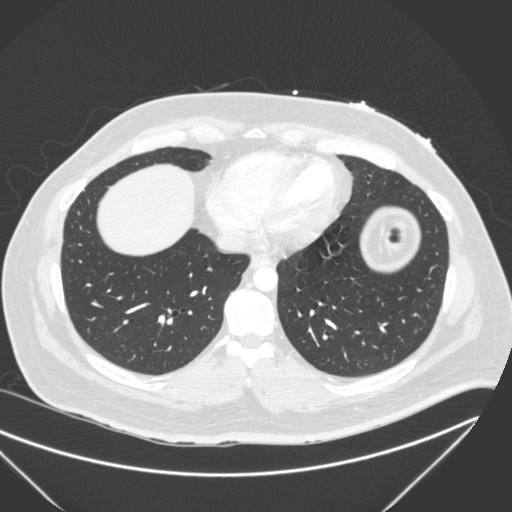
[im 125/291  lung]
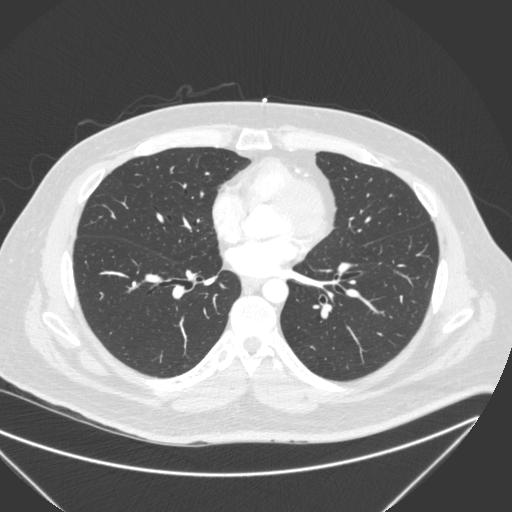
[im 166/291  lung]
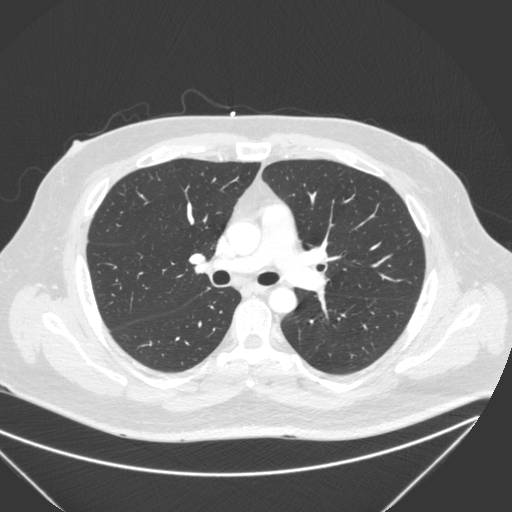
[im 208/291  lung]
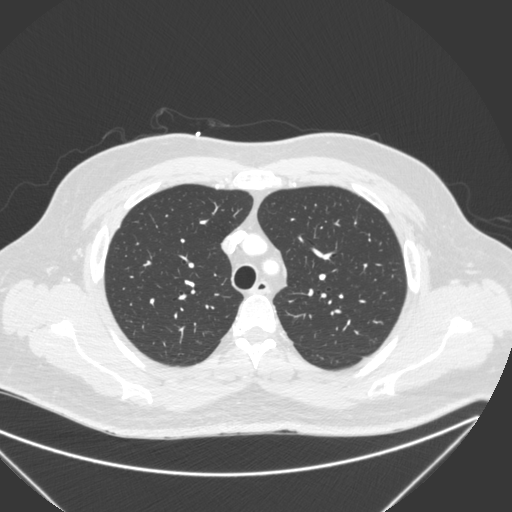
[im 249/291  lung]
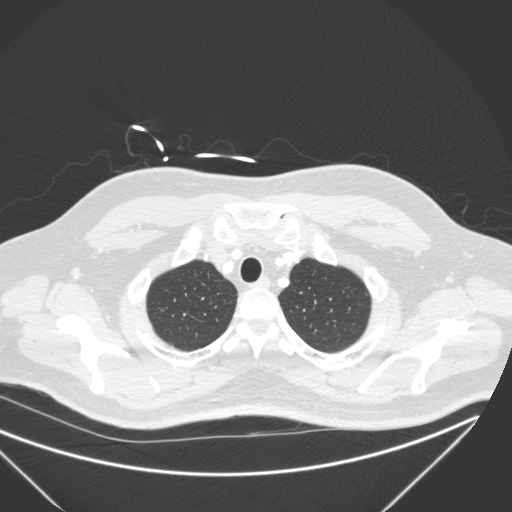

[13 of 37 positions shown; findings below may reference images not displayed]

FINDINGS: Suboptimal contrast bolus timing in the pulmonary
arterial tree, despite to injection attempts.  Central pulmonary
Hounsfield units of 150 or so.  No central pulmonary embolus.  No
main or lobar are pulmonary artery filling defect.  Enhancement of
the more distal pulmonary tree appears within normal limits, but
sensitivity is diminished.

Normal great vessels and visualized aorta.  Suggestion of LAD
coronary atherosclerosis (series 4 image 80).  No cardiomegaly.  No
pericardial or pleural effusion.  No mediastinal lymphadenopathy.
Negative thoracic inlet.  Abdomen and pelvis are described below.

Major airways are patent.  2 mm subpleural nodule in the left upper
lobe on series 6 image 86.  Lung parenchyma otherwise clear.

No acute osseous abnormality identified.

Review of the MIP images confirms the above findings.
IMPRESSION: 1.  Suboptimal pulmonary artery contrast bolus timing despite two
attempts.  No central or lumbar pulmonary embolus.  More distal
branches appear clear but are not optimally evaluated.  If
suspicion persists, VQ scan recommended.
2.  Evidence of LAD calcified coronary atherosclerosis. Negative
aorta.
3.  No acute pulmonary process.
2 mm left upper lobe subpleural nodule, likely benign and no follow-
up necessary unless the patient is a smoker or has other risk
factors for lung cancer (1 year follow up indicated in that case).
4.  Abdomen and pelvis findings below.

CT ABDOMEN AND PELVIS WITH CONTRAST
FINDINGS: Lower lumbar disc bulges. No acute osseous abnormality
identified.  No pelvic free fluid.  Gas in the rectosigmoid colon.
Bladder unremarkable.  Retained stool throughout the remaining
colon.  Normal appendix.  Distal small bowel within normal limits.
No dilated small bowel.  Stomach, duodenum, liver, gallbladder,
spleen, pancreas, adrenal glands, kidneys, and portal venous system
are within normal limits.  Major arterial structures are patent.
There is mild calcified atherosclerosis at the aorto iliac
bifurcation and extending into the iliac arteries bilaterally.  No
lymphadenopathy.  No abdominal free fluid.
IMPRESSION: Negative CT of the abdomen and pelvis.

## 2013-10-26 ENCOUNTER — Other Ambulatory Visit: Payer: Self-pay | Admitting: Orthopedic Surgery

## 2013-10-27 ENCOUNTER — Encounter (HOSPITAL_BASED_OUTPATIENT_CLINIC_OR_DEPARTMENT_OTHER): Payer: Self-pay | Admitting: *Deleted

## 2013-10-27 NOTE — Progress Notes (Signed)
10/27/13 1318  OBSTRUCTIVE SLEEP APNEA  Have you ever been diagnosed with sleep apnea through a sleep study? Yes  If yes, do you have and use a CPAP or BPAP machine every night? 0 (lost 100 lb)  Do you snore loudly (loud enough to be heard through closed doors)?  1  Do you often feel tired, fatigued, or sleepy during the daytime? 0  Has anyone observed you stop breathing during your sleep? 0  Do you have, or are you being treated for high blood pressure? 1  BMI more than 35 kg/m2? 1  Age over 20 years old? 0  Neck circumference greater than 40 cm/16 inches? 1  Gender: 1  Obstructive Sleep Apnea Score 5  Score 4 or greater  Results sent to PCP

## 2013-10-27 NOTE — Progress Notes (Signed)
Pt to come in for bmet-ekg-sees dr berry for tachy hr-diabetic-better controlled-lost 100lb-does not need cpap anymore

## 2013-11-01 ENCOUNTER — Encounter (HOSPITAL_BASED_OUTPATIENT_CLINIC_OR_DEPARTMENT_OTHER): Payer: Self-pay | Admitting: Orthopedic Surgery

## 2013-11-01 ENCOUNTER — Ambulatory Visit (HOSPITAL_BASED_OUTPATIENT_CLINIC_OR_DEPARTMENT_OTHER)
Admission: RE | Admit: 2013-11-01 | Payer: Managed Care, Other (non HMO) | Source: Ambulatory Visit | Admitting: Orthopedic Surgery

## 2013-11-01 ENCOUNTER — Encounter (HOSPITAL_BASED_OUTPATIENT_CLINIC_OR_DEPARTMENT_OTHER): Payer: Self-pay | Admitting: Certified Registered"

## 2013-11-01 HISTORY — DX: Carpal tunnel syndrome, unspecified upper limb: G56.00

## 2013-11-01 HISTORY — DX: Presence of spectacles and contact lenses: Z97.3

## 2013-11-01 HISTORY — DX: Cardiac arrhythmia, unspecified: I49.9

## 2013-11-01 HISTORY — DX: Sleep apnea, unspecified: G47.30

## 2013-11-01 SURGERY — CARPAL TUNNEL RELEASE
Anesthesia: Choice | Site: Wrist | Laterality: Bilateral

## 2013-11-01 MED ORDER — MIDAZOLAM HCL 2 MG/2ML IJ SOLN
INTRAMUSCULAR | Status: AC
  Filled 2013-11-01: qty 2

## 2013-11-01 MED ORDER — BUPIVACAINE HCL (PF) 0.25 % IJ SOLN
INTRAMUSCULAR | Status: AC
  Filled 2013-11-01: qty 30

## 2013-11-01 MED ORDER — FENTANYL CITRATE 0.05 MG/ML IJ SOLN
INTRAMUSCULAR | Status: AC
  Filled 2013-11-01: qty 6

## 2014-07-27 ENCOUNTER — Other Ambulatory Visit (HOSPITAL_COMMUNITY): Payer: Self-pay | Admitting: Neurosurgery

## 2014-08-14 ENCOUNTER — Other Ambulatory Visit (HOSPITAL_COMMUNITY): Payer: Self-pay | Admitting: *Deleted

## 2014-08-14 ENCOUNTER — Encounter (HOSPITAL_COMMUNITY)
Admission: RE | Admit: 2014-08-14 | Discharge: 2014-08-14 | Disposition: A | Discharge status: Home / Self Care | Payer: BLUE CROSS/BLUE SHIELD | Source: Ambulatory Visit | Attending: Neurosurgery | Admitting: Neurosurgery

## 2014-08-14 ENCOUNTER — Encounter (HOSPITAL_COMMUNITY): Payer: Self-pay

## 2014-08-14 DIAGNOSIS — E119 Type 2 diabetes mellitus without complications: Secondary | ICD-10-CM | POA: Insufficient documentation

## 2014-08-14 DIAGNOSIS — Z01810 Encounter for preprocedural cardiovascular examination: Secondary | ICD-10-CM | POA: Insufficient documentation

## 2014-08-14 DIAGNOSIS — Z01812 Encounter for preprocedural laboratory examination: Secondary | ICD-10-CM | POA: Insufficient documentation

## 2014-08-14 DIAGNOSIS — I10 Essential (primary) hypertension: Secondary | ICD-10-CM | POA: Insufficient documentation

## 2014-08-14 HISTORY — DX: Polyneuropathy in diseases classified elsewhere: G63

## 2014-08-14 HISTORY — DX: Depression, unspecified: F32.A

## 2014-08-14 HISTORY — DX: Anxiety disorder, unspecified: F41.9

## 2014-08-14 HISTORY — DX: Reserved for inherently not codable concepts without codable children: IMO0001

## 2014-08-14 HISTORY — DX: Constipation, unspecified: K59.00

## 2014-08-14 HISTORY — DX: Unspecified osteoarthritis, unspecified site: M19.90

## 2014-08-14 HISTORY — DX: Unspecified convulsions: R56.9

## 2014-08-14 HISTORY — DX: Inflammatory liver disease, unspecified: K75.9

## 2014-08-14 HISTORY — DX: Endocrine disorder, unspecified: E34.9

## 2014-08-14 HISTORY — DX: Major depressive disorder, single episode, unspecified: F32.9

## 2014-08-14 HISTORY — DX: Gastro-esophageal reflux disease without esophagitis: K21.9

## 2014-08-14 LAB — SURGICAL PCR SCREEN
MRSA, PCR: NEGATIVE
STAPHYLOCOCCUS AUREUS: NEGATIVE

## 2014-08-14 NOTE — Pre-Procedure Instructions (Signed)
[name]  08/14/2014   Your procedure is scheduled on:  Wednesday, May 25.  Report to [facility] Admitting at 9:15AM.   Call this number if you have problems the morning of surgery: [phone]                For any other questions, please call [phone], Monday - Friday 8 AM - 4 PM.   Remember:   Do not eat food or drink liquids after midnight Tuesday, May 24.   Take these medicines the morning of surgery with A SIP OF WATER: DULoxetine (CYMBALTA), gabapentin (NEURONTIN),  metoprolol tartrate (LOPRESSOR).               May take HYDROcodone-acetaminophen [facility]) if needed.               Stop taking Aspirin, Coumadin, Plavix, Effient and Herbal medications.  Don not take any NSAIDs ie: Ibuprofen,  Advil,Naproxen or any medication containing Aspirin.   Do not wear jewelry, make-up or nail polish.  Do not wear lotions, powders, or perfumes.              Men may shave face and neck.  Do not bring valuables to the hospital.              [facility] is not responsible for any belongings or valuables.               Contacts, dentures or bridgework may not be worn into surgery.  Leave suitcase in the car. After surgery it may be brought to your room.  For patients admitted to the hospital, discharge time is determined by your treatment team.        Special Instructions: Review  [facility] - Preparing For Surgery.   Please read over the following fact sheets that you were given: Pain Booklet, Coughing and Deep Breathing, Blood Transfusion Information and Surgical Site Infection Prevention

## 2014-08-14 NOTE — Pre-Procedure Instructions (Signed)
[name]  08/14/2014   Your procedure is scheduled on:  Wednesday, May 25.  Report to [facility] Admitting at 10:00AM.   Call this number if you have problems the morning of surgery: [phone]                For any other questions, please call [phone], Monday - Friday 8 AM - 4 PM.   Remember:   Do not eat food or drink liquids after midnight Tuesday, May 24.   Take these medicines the morning of surgery with A SIP OF WATER: DULoxetine (CYMBALTA), gabapentin (NEURONTIN),  metoprolol tartrate (LOPRESSOR).               May take HYDROcodone-acetaminophen [facility]) if needed.               Stop taking Aspirin, Coumadin, Plavix, Effient and Herbal medications.  Don not take any NSAIDs ie: Ibuprofen,  Advil,Naproxen or any medication containing Aspirin.   Do not wear jewelry, make-up or nail polish.  Do not wear lotions, powders, or perfumes.              Men may shave face and neck.  Do not bring valuables to the hospital.              [facility] is not responsible for any belongings or valuables.               Contacts, dentures or bridgework may not be worn into surgery.  Leave suitcase in the car. After surgery it may be brought to your room.  For patients admitted to the hospital, discharge time is determined by your treatment team.        Special Instructions: Review  [facility] - Preparing For Surgery.   Please read over the following fact sheets that you were given: Pain Booklet, Coughing and Deep Breathing, Blood Transfusion Information and Surgical Site Infection Prevention

## 2014-08-14 NOTE — Pre-Procedure Instructions (Signed)
[name]  08/14/2014   Your procedure is scheduled on:  Wednesday, May 25.  Report to [facility] Admitting at 10:00AM.   Call this number if you have problems the morning of surgery: [phone]                For any other questions, please call [phone], Monday - Friday 8 AM - 4 PM.   Remember:   Do not eat food or drink liquids after midnight Tuesday, May 24.   Take these medicines the morning of surgery with A SIP OF WATER: DULoxetine (CYMBALTA), gabapentin (NEURONTIN),  metoprolol tartrate (LOPRESSOR).               May take HYDROcodone-acetaminophen [facility]) if needed.                Keep Insulin Pump at Basal Rate               Stop taking Aspirin, Coumadin, Plavix, Effient and Herbal medications.  Don not take any NSAIDs ie: Ibuprofen,  Advil,Naproxen or any medication containing Aspirin.   Do not wear jewelry, make-up or nail polish.  Do not wear lotions, powders, or perfumes.              Men may shave face and neck.  Do not bring valuables to the hospital.              [facility] is not responsible for any belongings or valuables.               Contacts, dentures or bridgework may not be worn into surgery.  Leave suitcase in the car. After surgery it may be brought to your room.  For patients admitted to the hospital, discharge time is determined by your treatment team.        Special Instructions: Review  [place] [facility] - Preparing For Surgery.   Please read over the following fact sheets that you were given: Pain Booklet, Coughing and Deep Breathing, Blood Transfusion Information and Surgical Site Infection Prevention

## 2014-08-14 NOTE — Progress Notes (Addendum)
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  Patient started with Insulin pump about a month ago.. "CBG has dropped form 300's to 89 in am. "  "Last A1C was 10 something." A1C has not been rechecked since being on Insulin pump.  Patients diabetes is managed by [facility].  I have requested labs and last office notes.    [name] repeated to me that Insulin Pump will remain on basal rate after mn on 08/28/14 anto bring equipment to hospital with him.  I informed patient that Pump will be stopped during surgery, but when he is able to manage pump that it can be continued after surgery.  Patient denies chest pain or shortness of breath. Patient states that he was diagnosed with Sleep apnea"years ago', but since he lost weight (down from 350) that he does not have it.  Stop bang score 4.  Patient's surgery is 08/29/14, he will return on 08/26/24 for lab draw.

## 2014-08-27 ENCOUNTER — Encounter (HOSPITAL_COMMUNITY)
Admission: RE | Admit: 2014-08-27 | Discharge: 2014-08-27 | Disposition: A | Discharge status: Home / Self Care | Payer: BLUE CROSS/BLUE SHIELD | Source: Ambulatory Visit | Attending: Neurosurgery | Admitting: Neurosurgery

## 2014-08-27 LAB — COMPREHENSIVE METABOLIC PANEL
ALT: 62 U/L (ref 17–63)
AST: 49 U/L — ABNORMAL HIGH (ref 15–41)
Albumin: 3.3 g/dL — ABNORMAL LOW (ref 3.5–5.0)
Alkaline Phosphatase: 53 U/L (ref 38–126)
Anion gap: 6 (ref 5–15)
BILIRUBIN TOTAL: 0.5 mg/dL (ref 0.3–1.2)
BUN: 7 mg/dL (ref 6–20)
CALCIUM: 8.8 mg/dL — AB (ref 8.9–10.3)
CO2: 30 mmol/L (ref 22–32)
Chloride: 102 mmol/L (ref 101–111)
Creatinine, Ser: 0.67 mg/dL (ref 0.61–1.24)
GFR calc Af Amer: >60 mL/min (ref >60)
GFR calc non Af Amer: >60 mL/min (ref >60)
Glucose, Bld: 66 mg/dL (ref 65–99)
Potassium: 4.3 mmol/L (ref 3.5–5.1)
Sodium: 138 mmol/L (ref 135–145)
Total Protein: 7.4 g/dL (ref 6.5–8.1)

## 2014-08-27 LAB — CBC
HCT: 43.6 % (ref 39.0–52.0)
HEMOGLOBIN: 14.3 g/dL (ref 13.0–17.0)
MCH: 28.3 pg (ref 26.0–34.0)
MCHC: 32.8 g/dL (ref 30.0–36.0)
MCV: 86.2 fL (ref 78.0–100.0)
Platelets: 157 10*3/uL (ref 150–400)
RBC: 5.06 MIL/uL (ref 4.22–5.81)
RDW: 15 % (ref 11.5–15.5)
WBC: 5.1 10*3/uL (ref 4.0–10.5)

## 2014-08-27 LAB — TYPE AND SCREEN
ABO/RH(D): A POS
Antibody Screen: NEGATIVE

## 2014-08-27 LAB — GLUCOSE, CAPILLARY
GLUCOSE-CAPILLARY: 68 mg/dL (ref 65–99)
GLUCOSE-CAPILLARY: 75 mg/dL (ref 65–99)
Glucose-Capillary: 60 mg/dL — ABNORMAL LOW (ref 65–99)

## 2014-08-27 NOTE — Progress Notes (Addendum)
CBG on arrival for blood draw for surgery was 68.  Patient drank 186ml of gingerale. Patient reported that he did not really eat today. "I picked up as small snack on the way out the door, a few wheat crackers."  Patient also reported that he had administered 75unit bolus, CBG prior to bolus was "144".  Patient drank an additional 120 ml of gingerale. At 1400, patient's CBG was 75.  Patient states he feels good.  Patient going home to eat lunch.

## 2014-08-28 LAB — HEMOGLOBIN A1C
HEMOGLOBIN A1C: 8.3 % — AB (ref 4.8–5.6)
MEAN PLASMA GLUCOSE: 192 mg/dL

## 2014-08-28 MED ORDER — DEXTROSE 5 % IV SOLN
3.0000 g | INTRAVENOUS | Status: DC
  Filled 2014-08-28: qty 3000

## 2014-08-28 MED ORDER — DEXTROSE 5 % IV SOLN: 3.0000 g | INTRAVENOUS | Status: DC

## 2014-08-28 NOTE — Anesthesia Preprocedure Evaluation (Signed)
Anesthesia Evaluation  Patient identified by MRN, date of birth, ID band Patient awake    Reviewed: Allergy & Precautions, NPO status , Patient's Chart, lab work & pertinent test results  History of Anesthesia Complications Negative for: history of anesthetic complications  Airway Mallampati: II  TM Distance: >3 FB Neck ROM: Full    Dental no notable dental hx. (+) Dental Advisory Given   Pulmonary sleep apnea , former smoker,  breath sounds clear to auscultation  Pulmonary exam normal       Cardiovascular hypertension, Pt. on medications and Pt. on home beta blockers Normal cardiovascular examRhythm:Regular Rate:Normal     Neuro/Psych Seizures -,  PSYCHIATRIC DISORDERS Anxiety Depression    GI/Hepatic Neg liver ROS, GERD-  Medicated and Controlled,  Endo/Other  diabetes, Type 2, Oral Hypoglycemic AgentsMorbid obesity  Renal/GU negative Renal ROS  negative genitourinary   Musculoskeletal  (+) Arthritis -, Osteoarthritis,    Abdominal   Peds negative pediatric ROS (+)  Hematology negative hematology ROS (+)   Anesthesia Other Findings   Reproductive/Obstetrics negative OB ROS                             Anesthesia Physical Anesthesia Plan  ASA: III  Anesthesia Plan: General   Post-op Pain Management:    Induction: Intravenous  Airway Management Planned: Oral ETT  Additional Equipment:   Intra-op Plan:   Post-operative Plan: Extubation in OR  Informed Consent: I have reviewed the patients History and Physical, chart, labs and discussed the procedure including the risks, benefits and alternatives for the proposed anesthesia with the patient or authorized representative who has indicated his/her understanding and acceptance.   Dental advisory given  Plan Discussed with: CRNA  Anesthesia Plan Comments:         Anesthesia Quick Evaluation

## 2014-08-29 ENCOUNTER — Encounter (HOSPITAL_COMMUNITY): Payer: Self-pay | Admitting: *Deleted

## 2014-08-29 ENCOUNTER — Inpatient Hospital Stay (HOSPITAL_COMMUNITY): Payer: BLUE CROSS/BLUE SHIELD

## 2014-08-29 ENCOUNTER — Inpatient Hospital Stay (HOSPITAL_COMMUNITY): Payer: BLUE CROSS/BLUE SHIELD | Admitting: Anesthesiology

## 2014-08-29 ENCOUNTER — Inpatient Hospital Stay (HOSPITAL_COMMUNITY)
Admission: RE | Admit: 2014-08-29 | Discharge: 2014-09-01 | DRG: 460 | Disposition: A | Discharge status: Home Health | Payer: BLUE CROSS/BLUE SHIELD | Source: Ambulatory Visit | Attending: Neurosurgery | Admitting: Neurosurgery

## 2014-08-29 ENCOUNTER — Encounter (HOSPITAL_COMMUNITY)
Admission: RE | Disposition: A | Discharge status: Home Health | Payer: Self-pay | Source: Ambulatory Visit | Attending: Neurosurgery

## 2014-08-29 DIAGNOSIS — I10 Essential (primary) hypertension: Secondary | ICD-10-CM | POA: Diagnosis present

## 2014-08-29 DIAGNOSIS — E119 Type 2 diabetes mellitus without complications: Secondary | ICD-10-CM | POA: Diagnosis present

## 2014-08-29 DIAGNOSIS — M4726 Other spondylosis with radiculopathy, lumbar region: Principal | ICD-10-CM | POA: Diagnosis present

## 2014-08-29 DIAGNOSIS — M5126 Other intervertebral disc displacement, lumbar region: Secondary | ICD-10-CM

## 2014-08-29 DIAGNOSIS — M47816 Spondylosis without myelopathy or radiculopathy, lumbar region: Secondary | ICD-10-CM | POA: Diagnosis present

## 2014-08-29 DIAGNOSIS — Z981 Arthrodesis status: Secondary | ICD-10-CM

## 2014-08-29 DIAGNOSIS — M549 Dorsalgia, unspecified: Secondary | ICD-10-CM | POA: Diagnosis present

## 2014-08-29 LAB — GLUCOSE, CAPILLARY
GLUCOSE-CAPILLARY: 119 mg/dL — AB (ref 65–99)
GLUCOSE-CAPILLARY: 114 mg/dL — AB (ref 65–99)
Glucose-Capillary: 97 mg/dL (ref 65–99)
Glucose-Capillary: 99 mg/dL (ref 65–99)
Glucose-Capillary: 92 mg/dL (ref 65–99)
Glucose-Capillary: 78 mg/dL (ref 65–99)
Glucose-Capillary: 81 mg/dL (ref 65–99)
Glucose-Capillary: 110 mg/dL — ABNORMAL HIGH (ref 65–99)
Glucose-Capillary: 103 mg/dL — ABNORMAL HIGH (ref 65–99)
Glucose-Capillary: 103 mg/dL — ABNORMAL HIGH (ref 65–99)

## 2014-08-29 SURGERY — POSTERIOR LUMBAR FUSION 1 LEVEL
Anesthesia: General | Laterality: Left

## 2014-08-29 MED ORDER — MAGNESIUM CITRATE PO SOLN
1.0000 | Freq: Once | ORAL | Status: AC | PRN
  Filled 2014-08-29: qty 296

## 2014-08-29 MED ORDER — INSULIN PUMP: 75.0000 | Freq: Three times a day (TID) | SUBCUTANEOUS | Status: DC

## 2014-08-29 MED ORDER — ACETAMINOPHEN 650 MG RE SUPP: 650.0000 mg | RECTAL | Status: DC | PRN

## 2014-08-29 MED ORDER — PROPOFOL 10 MG/ML IV BOLUS
INTRAVENOUS | Status: AC
  Filled 2014-08-29: qty 20

## 2014-08-29 MED ORDER — POTASSIUM CHLORIDE IN NACL 20-0.9 MEQ/L-% IV SOLN
INTRAVENOUS | Status: DC
  Administered 2014-08-29: 21:00:00 via INTRAVENOUS
  Filled 2014-08-29: qty 1000

## 2014-08-29 MED ORDER — SODIUM CHLORIDE 0.9 % IJ SOLN: 3.0000 mL | INTRAMUSCULAR | Status: DC | PRN

## 2014-08-29 MED ORDER — THROMBIN 20000 UNITS EX SOLR
CUTANEOUS | Status: DC | PRN
  Administered 2014-08-29: 20 mL via TOPICAL

## 2014-08-29 MED ORDER — FENTANYL CITRATE (PF) 100 MCG/2ML IJ SOLN
INTRAMUSCULAR | Status: AC
  Filled 2014-08-29: qty 2

## 2014-08-29 MED ORDER — ONDANSETRON HCL 4 MG/2ML IJ SOLN
INTRAMUSCULAR | Status: AC
  Filled 2014-08-29: qty 2

## 2014-08-29 MED ORDER — PHENOL 1.4 % MT LIQD: 1.0000 | OROMUCOSAL | Status: DC | PRN

## 2014-08-29 MED ORDER — SODIUM CHLORIDE 0.9 % IV SOLN: 250.0000 mL | INTRAVENOUS | Status: DC

## 2014-08-29 MED ORDER — SITAGLIPTIN PHOS-METFORMIN HCL 50-1000 MG PO TABS: 1.0000 | ORAL_TABLET | ORAL | Status: DC

## 2014-08-29 MED ORDER — VECURONIUM BROMIDE 10 MG IV SOLR
INTRAVENOUS | Status: DC | PRN
  Administered 2014-08-29: 2 mg via INTRAVENOUS
  Administered 2014-08-29 (×3): 1 mg via INTRAVENOUS

## 2014-08-29 MED ORDER — METOPROLOL TARTRATE 25 MG PO TABS
25.0000 mg | ORAL_TABLET | Freq: Two times a day (BID) | ORAL | Status: DC
  Administered 2014-08-29 – 2014-08-31 (×5): 25 mg via ORAL
  Filled 2014-08-29 (×6): qty 1

## 2014-08-29 MED ORDER — MENTHOL 3 MG MT LOZG: 1.0000 | LOZENGE | OROMUCOSAL | Status: DC | PRN

## 2014-08-29 MED ORDER — DIAZEPAM 5 MG PO TABS
5.0000 mg | ORAL_TABLET | Freq: Four times a day (QID) | ORAL | Status: DC | PRN
  Administered 2014-08-31 – 2014-09-01 (×4): 5 mg via ORAL
  Filled 2014-08-29 (×4): qty 1

## 2014-08-29 MED ORDER — ROCURONIUM BROMIDE 50 MG/5ML IV SOLN
INTRAVENOUS | Status: AC
Start: 2014-08-29 — End: 2014-08-29
  Filled 2014-08-29: qty 2

## 2014-08-29 MED ORDER — PNEUMOCOCCAL VAC POLYVALENT 25 MCG/0.5ML IJ INJ
0.5000 mL | INJECTION | INTRAMUSCULAR | Status: AC
  Administered 2014-08-30: 0.5 mL via INTRAMUSCULAR

## 2014-08-29 MED ORDER — PHENYLEPHRINE 40 MCG/ML (10ML) SYRINGE FOR IV PUSH (FOR BLOOD PRESSURE SUPPORT)
PREFILLED_SYRINGE | INTRAVENOUS | Status: AC
  Filled 2014-08-29: qty 20

## 2014-08-29 MED ORDER — ONDANSETRON HCL 4 MG/2ML IJ SOLN: 4.0000 mg | Freq: Once | INTRAMUSCULAR | Status: DC | PRN

## 2014-08-29 MED ORDER — SODIUM CHLORIDE 0.9 % IJ SOLN
3.0000 mL | Freq: Two times a day (BID) | INTRAMUSCULAR | Status: DC
  Administered 2014-08-29 – 2014-08-31 (×4): 3 mL via INTRAVENOUS

## 2014-08-29 MED ORDER — LIDOCAINE-EPINEPHRINE 0.5 %-1:200000 IJ SOLN
INTRAMUSCULAR | Status: DC | PRN
  Administered 2014-08-29: 10 mL

## 2014-08-29 MED ORDER — MIDAZOLAM HCL 5 MG/5ML IJ SOLN
INTRAMUSCULAR | Status: DC | PRN
  Administered 2014-08-29: 2 mg via INTRAVENOUS

## 2014-08-29 MED ORDER — HYDROMORPHONE HCL 1 MG/ML IJ SOLN
0.5000 mg | INTRAMUSCULAR | Status: DC | PRN
  Administered 2014-08-29 – 2014-08-31 (×7): 1 mg via INTRAVENOUS
  Filled 2014-08-29 (×6): qty 1

## 2014-08-29 MED ORDER — ROCURONIUM BROMIDE 50 MG/5ML IV SOLN
INTRAVENOUS | Status: AC
  Filled 2014-08-29: qty 1

## 2014-08-29 MED ORDER — OXYCODONE-ACETAMINOPHEN 5-325 MG PO TABS
1.0000 | ORAL_TABLET | ORAL | Status: DC | PRN
  Administered 2014-08-29 – 2014-09-01 (×12): 2 via ORAL
  Filled 2014-08-29 (×12): qty 2

## 2014-08-29 MED ORDER — ONDANSETRON HCL 4 MG/2ML IJ SOLN
INTRAMUSCULAR | Status: DC | PRN
  Administered 2014-08-29: 4 mg via INTRAVENOUS

## 2014-08-29 MED ORDER — SUCCINYLCHOLINE CHLORIDE 20 MG/ML IJ SOLN
INTRAMUSCULAR | Status: AC
  Filled 2014-08-29: qty 1

## 2014-08-29 MED ORDER — LIDOCAINE HCL (CARDIAC) 20 MG/ML IV SOLN
INTRAVENOUS | Status: DC | PRN
  Administered 2014-08-29: 100 mg via INTRAVENOUS

## 2014-08-29 MED ORDER — NEOSTIGMINE METHYLSULFATE 10 MG/10ML IV SOLN
INTRAVENOUS | Status: DC | PRN
  Administered 2014-08-29: 5 mg via INTRAVENOUS

## 2014-08-29 MED ORDER — EPHEDRINE SULFATE 50 MG/ML IJ SOLN
INTRAMUSCULAR | Status: AC
  Filled 2014-08-29: qty 2

## 2014-08-29 MED ORDER — MIDAZOLAM HCL 2 MG/2ML IJ SOLN
INTRAMUSCULAR | Status: AC
  Filled 2014-08-29: qty 2

## 2014-08-29 MED ORDER — FENTANYL CITRATE (PF) 250 MCG/5ML IJ SOLN
INTRAMUSCULAR | Status: AC
  Filled 2014-08-29: qty 5

## 2014-08-29 MED ORDER — SENNOSIDES-DOCUSATE SODIUM 8.6-50 MG PO TABS: 1.0000 | ORAL_TABLET | Freq: Every evening | ORAL | Status: DC | PRN

## 2014-08-29 MED ORDER — GABAPENTIN 300 MG PO CAPS
300.0000 mg | ORAL_CAPSULE | Freq: Four times a day (QID) | ORAL | Status: DC
  Administered 2014-08-29 – 2014-09-01 (×10): 300 mg via ORAL
  Filled 2014-08-29 (×10): qty 1

## 2014-08-29 MED ORDER — LIDOCAINE HCL (CARDIAC) 20 MG/ML IV SOLN
INTRAVENOUS | Status: AC
  Filled 2014-08-29: qty 5

## 2014-08-29 MED ORDER — ONDANSETRON HCL 4 MG/2ML IJ SOLN: 4.0000 mg | INTRAMUSCULAR | Status: DC | PRN

## 2014-08-29 MED ORDER — BISACODYL 5 MG PO TBEC: 5.0000 mg | DELAYED_RELEASE_TABLET | Freq: Every day | ORAL | Status: DC | PRN

## 2014-08-29 MED ORDER — 0.9 % SODIUM CHLORIDE (POUR BTL) OPTIME
TOPICAL | Status: DC | PRN
  Administered 2014-08-29: 1000 mL

## 2014-08-29 MED ORDER — GLYCOPYRROLATE 0.2 MG/ML IJ SOLN
INTRAMUSCULAR | Status: DC | PRN
  Administered 2014-08-29: .8 mg via INTRAVENOUS

## 2014-08-29 MED ORDER — STERILE WATER FOR INJECTION IJ SOLN
INTRAMUSCULAR | Status: AC
  Filled 2014-08-29: qty 10

## 2014-08-29 MED ORDER — IRBESARTAN 150 MG PO TABS
150.0000 mg | ORAL_TABLET | Freq: Every day | ORAL | Status: DC
  Administered 2014-08-29 – 2014-09-01 (×4): 150 mg via ORAL
  Filled 2014-08-29 (×4): qty 1

## 2014-08-29 MED ORDER — HYDROMORPHONE HCL 1 MG/ML IJ SOLN
INTRAMUSCULAR | Status: AC
  Filled 2014-08-29: qty 1

## 2014-08-29 MED ORDER — METFORMIN HCL ER 500 MG PO TB24
1000.0000 mg | ORAL_TABLET | Freq: Every day | ORAL | Status: DC
  Administered 2014-08-29 – 2014-08-31 (×3): 1000 mg via ORAL
  Filled 2014-08-29 (×3): qty 2

## 2014-08-29 MED ORDER — HYDROCODONE-ACETAMINOPHEN 5-325 MG PO TABS: 1.0000 | ORAL_TABLET | ORAL | Status: DC | PRN

## 2014-08-29 MED ORDER — CLONAZEPAM 1 MG PO TABS
2.0000 mg | ORAL_TABLET | Freq: Every day | ORAL | Status: DC
  Administered 2014-08-29 – 2014-08-31 (×3): 2 mg via ORAL
  Filled 2014-08-29 (×3): qty 2

## 2014-08-29 MED ORDER — DULOXETINE HCL 60 MG PO CPEP
60.0000 mg | ORAL_CAPSULE | Freq: Every day | ORAL | Status: DC
  Administered 2014-08-30 – 2014-09-01 (×3): 60 mg via ORAL
  Filled 2014-08-29 (×3): qty 1

## 2014-08-29 MED ORDER — SODIUM CHLORIDE 0.9 % IV SOLN
INTRAVENOUS | Status: DC
  Filled 2014-08-29: qty 2.5

## 2014-08-29 MED ORDER — DOCUSATE SODIUM 100 MG PO CAPS
100.0000 mg | ORAL_CAPSULE | Freq: Two times a day (BID) | ORAL | Status: DC
  Administered 2014-08-29 – 2014-09-01 (×6): 100 mg via ORAL
  Filled 2014-08-29 (×6): qty 1

## 2014-08-29 MED ORDER — VECURONIUM BROMIDE 10 MG IV SOLR
INTRAVENOUS | Status: AC
  Filled 2014-08-29: qty 20

## 2014-08-29 MED ORDER — LINAGLIPTIN 5 MG PO TABS
5.0000 mg | ORAL_TABLET | Freq: Every day | ORAL | Status: DC
  Administered 2014-08-30 – 2014-09-01 (×3): 5 mg via ORAL
  Filled 2014-08-29 (×3): qty 1

## 2014-08-29 MED ORDER — GLYCOPYRROLATE 0.2 MG/ML IJ SOLN
INTRAMUSCULAR | Status: AC
  Filled 2014-08-29: qty 7

## 2014-08-29 MED ORDER — FENTANYL CITRATE (PF) 100 MCG/2ML IJ SOLN
25.0000 ug | INTRAMUSCULAR | Status: DC | PRN
  Administered 2014-08-29 (×2): 50 ug via INTRAVENOUS

## 2014-08-29 MED ORDER — ALUM & MAG HYDROXIDE-SIMETH 200-200-20 MG/5ML PO SUSP: 30.0000 mL | Freq: Four times a day (QID) | ORAL | Status: DC | PRN

## 2014-08-29 MED ORDER — SODIUM CHLORIDE 0.9 % IJ SOLN
INTRAMUSCULAR | Status: AC
  Filled 2014-08-29: qty 20

## 2014-08-29 MED ORDER — ACETAMINOPHEN 325 MG PO TABS: 650.0000 mg | ORAL_TABLET | ORAL | Status: DC | PRN

## 2014-08-29 MED ORDER — DEXTROSE 5 % IV SOLN
3.0000 g | INTRAVENOUS | Status: DC | PRN
  Administered 2014-08-29: 3 g via INTRAVENOUS

## 2014-08-29 MED ORDER — PROPOFOL 10 MG/ML IV BOLUS
INTRAVENOUS | Status: DC | PRN
Start: 2014-08-29 — End: 2014-08-29
  Administered 2014-08-29: 270 mg via INTRAVENOUS

## 2014-08-29 MED ORDER — INSULIN REGULAR HUMAN 100 UNIT/ML IJ SOLN
250.0000 [IU] | INTRAMUSCULAR | Status: DC | PRN
  Administered 2014-08-29: .6 [IU]/h via INTRAVENOUS

## 2014-08-29 MED ORDER — ROCURONIUM BROMIDE 100 MG/10ML IV SOLN
INTRAVENOUS | Status: DC | PRN
  Administered 2014-08-29: 50 mg via INTRAVENOUS

## 2014-08-29 MED ORDER — METFORMIN HCL 500 MG PO TABS
1000.0000 mg | ORAL_TABLET | Freq: Every day | ORAL | Status: DC
  Administered 2014-08-30 – 2014-09-01 (×3): 1000 mg via ORAL
  Filled 2014-08-29 (×3): qty 2

## 2014-08-29 MED ORDER — LACTATED RINGERS IV SOLN
INTRAVENOUS | Status: DC
  Administered 2014-08-29 (×3): via INTRAVENOUS

## 2014-08-29 MED ORDER — INSULIN ASPART 100 UNIT/ML ~~LOC~~ SOLN
0.0000 [IU] | Freq: Three times a day (TID) | SUBCUTANEOUS | Status: DC
  Administered 2014-08-30 – 2014-09-01 (×6): 3 [IU] via SUBCUTANEOUS

## 2014-08-29 MED ORDER — FENTANYL CITRATE (PF) 100 MCG/2ML IJ SOLN
INTRAMUSCULAR | Status: DC | PRN
  Administered 2014-08-29: 150 ug via INTRAVENOUS
  Administered 2014-08-29 (×5): 50 ug via INTRAVENOUS

## 2014-08-29 SURGICAL SUPPLY — 67 items
BENZOIN TINCTURE PRP APPL 2/3 (GAUZE/BANDAGES/DRESSINGS) IMPLANT
BLADE CLIPPER SURG (BLADE) ×3 IMPLANT
BUR MATCHSTICK NEURO 3.0 LAGG (BURR) ×3 IMPLANT
CANISTER SUCT 3000ML PPV (MISCELLANEOUS) ×3 IMPLANT
CLOSURE WOUND 1/2 X4 (GAUZE/BANDAGES/DRESSINGS)
CONT SPEC 4OZ CLIKSEAL STRL BL (MISCELLANEOUS) ×3 IMPLANT
COVER BACK TABLE 60X90IN (DRAPES) ×3 IMPLANT
DECANTER SPIKE VIAL GLASS SM (MISCELLANEOUS) ×3 IMPLANT
DERMABOND ADVANCED (GAUZE/BANDAGES/DRESSINGS) ×2
DERMABOND ADVANCED .7 DNX12 (GAUZE/BANDAGES/DRESSINGS) ×1 IMPLANT
DRAPE C-ARM 42X72 X-RAY (DRAPES) ×3 IMPLANT
DRAPE C-ARMOR (DRAPES) ×3 IMPLANT
DRAPE LAPAROTOMY 100X72X124 (DRAPES) ×3 IMPLANT
DRAPE POUCH INSTRU U-SHP 10X18 (DRAPES) ×3 IMPLANT
DRAPE SURG 17X23 STRL (DRAPES) ×3 IMPLANT
DRSG OPSITE POSTOP 4X6 (GAUZE/BANDAGES/DRESSINGS) ×3 IMPLANT
DRSG TELFA 3X8 NADH (GAUZE/BANDAGES/DRESSINGS) IMPLANT
DURAPREP 26ML APPLICATOR (WOUND CARE) ×3 IMPLANT
ELECT BLADE 6.5 EXT (BLADE) ×3 IMPLANT
ELECT REM PT RETURN 9FT ADLT (ELECTROSURGICAL) ×3
ELECTRODE REM PT RTRN 9FT ADLT (ELECTROSURGICAL) ×1 IMPLANT
GAUZE SPONGE 4X4 12PLY STRL (GAUZE/BANDAGES/DRESSINGS) IMPLANT
GAUZE SPONGE 4X4 16PLY XRAY LF (GAUZE/BANDAGES/DRESSINGS) IMPLANT
GLOVE BIO SURGEON STRL SZ7 (GLOVE) ×6 IMPLANT
GLOVE BIOGEL PI IND STRL 7.5 (GLOVE) ×2 IMPLANT
GLOVE BIOGEL PI INDICATOR 7.5 (GLOVE) ×4
GLOVE ECLIPSE 6.5 STRL STRAW (GLOVE) ×3 IMPLANT
GLOVE EXAM NITRILE LRG STRL (GLOVE) IMPLANT
GLOVE EXAM NITRILE MD LF STRL (GLOVE) IMPLANT
GLOVE EXAM NITRILE XL STR (GLOVE) IMPLANT
GLOVE EXAM NITRILE XS STR PU (GLOVE) IMPLANT
GOWN STRL REUS W/ TWL LRG LVL3 (GOWN DISPOSABLE) ×2 IMPLANT
GOWN STRL REUS W/ TWL XL LVL3 (GOWN DISPOSABLE) ×1 IMPLANT
GOWN STRL REUS W/TWL 2XL LVL3 (GOWN DISPOSABLE) IMPLANT
GOWN STRL REUS W/TWL LRG LVL3 (GOWN DISPOSABLE) ×4
GOWN STRL REUS W/TWL XL LVL3 (GOWN DISPOSABLE) ×2
GRAFT BN 5X1XSPNE CVD POST DBM (Bone Implant) ×1 IMPLANT
GRAFT BONE MAGNIFUSE 1X5CM (Bone Implant) ×2 IMPLANT
KIT BASIN OR (CUSTOM PROCEDURE TRAY) ×3 IMPLANT
KIT POSITION SURG JACKSON T1 (MISCELLANEOUS) ×3 IMPLANT
KIT ROOM TURNOVER OR (KITS) ×3 IMPLANT
LIQUID BAND (GAUZE/BANDAGES/DRESSINGS) IMPLANT
NEEDLE HYPO 25X1 1.5 SAFETY (NEEDLE) ×3 IMPLANT
NEEDLE SPNL 18GX3.5 QUINCKE PK (NEEDLE) IMPLANT
NS IRRIG 1000ML POUR BTL (IV SOLUTION) ×3 IMPLANT
PACK LAMINECTOMY NEURO (CUSTOM PROCEDURE TRAY) ×3 IMPLANT
PAD ARMBOARD 7.5X6 YLW CONV (MISCELLANEOUS) ×6 IMPLANT
SCREW 5.0X30 (Screw) ×3 IMPLANT
SCREW LOCK (Screw) ×4 IMPLANT
SCREW LOCK FXNS SPNE MAS PL (Screw) ×2 IMPLANT
SCREW SHANK 6.5X65 (Screw) ×3 IMPLANT
SCREW SHANKS 5.5X35 (Screw) ×3 IMPLANT
SCREW TULIP 5.5 (Screw) ×6 IMPLANT
SPACER PLIF 12MM (Bone Implant) ×3 IMPLANT
SPONGE LAP 4X18 X RAY DECT (DISPOSABLE) IMPLANT
SPONGE SURGIFOAM ABS GEL 100 (HEMOSTASIS) ×3 IMPLANT
STRIP CLOSURE SKIN 1/2X4 (GAUZE/BANDAGES/DRESSINGS) IMPLANT
SUT PROLENE 6 0 BV (SUTURE) IMPLANT
SUT VIC AB 0 CT1 18XCR BRD8 (SUTURE) ×1 IMPLANT
SUT VIC AB 0 CT1 8-18 (SUTURE) ×2
SUT VIC AB 2-0 CT1 18 (SUTURE) ×3 IMPLANT
SUT VIC AB 3-0 SH 8-18 (SUTURE) ×3 IMPLANT
SYR 20ML ECCENTRIC (SYRINGE) ×3 IMPLANT
TOWEL OR 17X24 6PK STRL BLUE (TOWEL DISPOSABLE) ×3 IMPLANT
TOWEL OR 17X26 10 PK STRL BLUE (TOWEL DISPOSABLE) ×3 IMPLANT
TRAY FOLEY W/METER SILVER 14FR (SET/KITS/TRAYS/PACK) ×3 IMPLANT
WATER STERILE IRR 1000ML POUR (IV SOLUTION) ×3 IMPLANT

## 2014-08-29 NOTE — Anesthesia Postprocedure Evaluation (Signed)
  Anesthesia Post-op Note  Patient: [name]  Procedure(s) Performed: Procedure(s) (LRB): POSTERIOR LUMBAR FUSION 1 LEVEL (Left)  Patient Location: PACU  Anesthesia Type: General  Level of Consciousness: awake and alert   Airway and Oxygen Therapy: Patient Spontanous Breathing  Post-op Pain: mild  Post-op Assessment: Post-op Vital signs reviewed, Patient's Cardiovascular Status Stable, Respiratory Function Stable, Patent Airway and No signs of Nausea or vomiting  Last Vitals:  Filed Vitals:   08/29/14 1715  BP: 136/70  Pulse: 86  Temp:   Resp: 13    Post-op Vital Signs: stable   Complications: No apparent anesthesia complications

## 2014-08-29 NOTE — Progress Notes (Signed)
Pt admitted from PACU at [facility], c/o of slight pain, pt settled in bed, alert and oriented,call light at bedside, ortho tech paged for brace order, will however continue to monitor. [name], [name]

## 2014-08-29 NOTE — H&P (Signed)
BP 160/100 mmHg  Pulse 72  Temp(Src) 98.1 F (36.7 C) (Oral)  Resp 20  Ht 5\' 10"  (1.778 m)  Wt 126.1 kg (278 lb)  BMI 39.89 kg/m2  SpO2 100% [name] returns today. He is a patient of mine in 2001 in August where he underwent far lateral lumbar discectomy on the left side at L5-S1. He had done well since that time with regards to his lumbar region until approximately two to three weeks ago when he was bending over, picking something up and he felt an immediate pain. Pain then started to radiate into and down the left lower extremity and he felt very much like he had done for 10 years prior. He is 49 years of age. 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 REVIEW OF SYSTEMS: Positive for back pain, left lower extremity pain, leg pain with walking and at rest, arthritis, neck pain, leg weakness, night sweats, eye glasses, hypertension, arrhythmia, hypercholesterolemia, liver disease, change in his bowel habits, seizure disorder, memory problems, difficulty with speech, difficulty concentrating, anxiety, depression, and diabetes. He denies allergic, hematologic, skin, respiratory, ears, nose, throat and mouth problems. On his pain chart, [name] lists pain only in his left lower extremity along with some numbness there. It extends across the top of the buttock on the left side. He said lying down will sometimes alleviate the pain. PAST MEDICAL HISTORY:   Current Medical Conditions: Significant for hypertension and diabetes. [name] weakness in his legs and his back and the pain is in his left lower extremity.  Prior Operations: He had knee surgery also in 2008.  Medications and Allergies: No known drug allergies. He is taking clonazepam, Cymbalta, Diovan, gabapentin, hydroxyzine, Janumet, Lantus Solostar, metformin, metoprolol, tramadol, valsartan. FAMILY HISTORY: Mother is deceased. She had pancreatic cancer. Father died at age 5 secondary to a myocardial  infarction. Diabetes, hypertension, colon cancer, stomach cancer, prostate cancer, degenerative spine disorder has been present in the family history. SOCIAL HISTORY: He does not smoke. He does drink alcohol socially. He does not use illicit drugs. PHYSICAL EXAMINATION: Vital signs today, height is 71 inches, weight 277.2 pounds, and BMI is 38.66. Blood pressure 129/78, pulse is 83, respiratory rate is 16, and temperature is 96.3 Fahrenheit. Pain is [name] as being 6/10. On examination, he is alert and oriented x4 and answers all questions appropriately. Memory, language, attention span and fund of knowledge is normal. Pupils are equal, round and reactive to light. Full extraocular movements. Full visual fields. He has 5/5 strength in both upper and lower extremities. He can toe walk and heel walk. There was some difficulty on his left side. Reflexes are 2+ biceps, triceps, brachioradialis, 2+ at the knees and 1+ at the ankles. Proprioception is normal in the lower extremities. Toes are downgoing to plantar stimulation. No clonus is appreciated. DIAGNOSTIC STUDIES: MRI of the lumbar spine is reviewed. It was done without contrast. What it shows is a hump eccentric to the left side at L5-S1. I looked back at a CT of the abdomen done in 2011 and it appears that theirs certainly some calcium where this lesion is, because it doesn't appear to be an acute disc at all. [name] returns today and he has undergone the epidural injections. He has had no improvement. At this point in time he has stated that he is just going to have to figure out what he can do. His brother recently died within the last month, and  he says that is a very much a shock to the system. So for right now, he is going to ponder the thought.   IMPRESSION/PLAN: I will proceed with a decompression of the L5, and S1 roots. would be a left-sided fusion at L5-S1 with a take-down of the left 5-1 facet, and removal of that large osteophyte.

## 2014-08-29 NOTE — Transfer of Care (Signed)
Immediate Anesthesia Transfer of Care Note  Patient: [name]  Procedure(s) Performed: Procedure(s) with comments: POSTERIOR LUMBAR FUSION 1 LEVEL (Left) - Left L5S1 posterior lumbar interbody fusion with interbody prosthesis posterior lateral arthrodesis and posterior nonsegmental instrumentation  Patient Location: PACU  Anesthesia Type:General  Level of Consciousness: awake, alert  and oriented  Airway & Oxygen Therapy: Patient Spontanous Breathing and Patient connected to face mask oxygen  Post-op Assessment: Report given to RN and Post -op Vital signs reviewed and stable  Post vital signs: Reviewed and stable  Last Vitals:  Filed Vitals:   08/29/14 1021  BP: 160/100  Pulse: 72  Temp: 36.7 C  Resp: 20    Complications: No apparent anesthesia complications

## 2014-08-29 NOTE — Anesthesia Procedure Notes (Signed)
Procedure Name: Intubation Date/Time: 08/29/2014 12:51 PM Performed by: Talbot Grumbling Pre-anesthesia Checklist: Patient identified, Emergency Drugs available, Suction available and Patient being monitored Patient Re-evaluated:Patient Re-evaluated prior to inductionOxygen Delivery Method: Circle system utilized Preoxygenation: Pre-oxygenation with 100% oxygen Intubation Type: IV induction Ventilation: Oral airway inserted - appropriate to patient size and Mask ventilation without difficulty Laryngoscope Size: Mac and 4 Grade View: Grade II Tube type: Oral Tube size: 7.5 mm Number of attempts: 1 Airway Equipment and Method: Stylet Placement Confirmation: ETT inserted through vocal cords under direct vision,  positive ETCO2 and breath sounds checked- equal and bilateral Secured at: 23 cm Tube secured with: Tape Dental Injury: Teeth and Oropharynx as per pre-operative assessment

## 2014-08-29 NOTE — Op Note (Addendum)
08/29/2014  5:18 PM  PATIENT:  [name]  49 y.o. male with pain in the left lower extremity and facet arthropathy causing left lower extremity pain.  PRE-OPERATIVE DIAGNOSIS:  Lumbar osteoarthritis with radiculopathy, L5 left  POST-OPERATIVE DIAGNOSIS: Lumbar osteoarthritis with radiculopathy, L5 left  PROCEDURE:  Procedure(s): POSTERIOR LUMBAR interbody arthrodesis, structural allograft 36mm(synthes)  Posterolateral arthrodesis L5/S1 morselized allograft and autograft Non segmental pedicle screw fixAtion on the left Nuvasive hardware  SURGEON:  Surgeon(s): Ashok Pall, MD Eustace Moore, MD  ASSISTANTS:Jones, Shanon Brow  ANESTHESIA:   general  EBL:  Total I/O In: 1500 [I.V.:1500] Out: 500 [Urine:300; Blood:200]  BLOOD ADMINISTERED:none  CELL SAVER GIVEN:none  COUNT:per nursing  DRAINS: none   SPECIMEN:  No Specimen  DICTATION: [name] is a 49 y.o. male whom was taken to the operating room intubated, and placed under a general anesthetic without difficulty. A foley catheter was placed under sterile conditions. He was positioned prone on a Jackson stable with all pressure points properly padded.  His lumbar region was prepped and draped in a sterile manner. I infiltrated 10cc's 1/2%lidocaine/1:2000,000 strength epinephrine into the planned incision. I opened the skin with a 10 blade and took the incision down to the thoracolumbar fascia. I exposed the lamina of L4,5, and S1 in a subperiosteal fashion bilaterally. I confirmed my location with an intraoperative xray.  I placed self retaining retractors and started the decompression.  I decompressed the spinal canal and the L5 root on the left via an inferior facetectomy of L5 on the left, and partial superior facetectomy of the sacrum on the left. The nerve root had free egress with removal of the ligament and bone.  A PLIF was performed at L5/S1 in the same fashion. I opened the disc space with a 15 blade then used a  variety of instruments to remove the disc and prepare the space for the arthrodesis. I used curettes, rongeurs, punches, shavers for the disc space, and rasps in the discetomy. I measured the disc space and placed one 89mm structural allograft(Synthes) i nto the disc space(s).  I decorticated the lateral bone at L5.and the sacrum. I then placed morselized allograft and autograft on the decorticated surfaces to complete the posterolateral arthrodesis. I also placed bone on the lamina of L5 and the sacrum on the right side.  I placed pedicle screws at L5, and S1, using fluoroscopic guidance. I drilled a pilot hole, then cannulated the pedicle with a drill, and bone probe at each site. I then tapped each pedicle, assessing each site for pedicle violations. No cutouts were appreciated. Screws Harlin Heys) were then placed at each site without difficulty. Final films were performed and all screws appeared to be in good position.  We closed the wound in a layered fashion. We approximated the thoracolumbar fascia, subcutaneous, and subcuticular planes with vicryl sutures. I used dermabond, and an occlusive bandage for a sterile dressing.     PLAN OF CARE: Admit to inpatient   PATIENT DISPOSITION:  PACU - hemodynamically stable.   Delay start of Pharmacological VTE agent (>24hrs) due to surgical blood loss or risk of bleeding:  yes

## 2014-08-29 NOTE — Progress Notes (Signed)
Insulin pump stopped 11:15.  Blood sugar @ 11:30am is  99.  Insulin to be sent up to neuro.  Have been touching base with Dr. Jillyn Hidden.  DA

## 2014-08-30 LAB — GLUCOSE, CAPILLARY
GLUCOSE-CAPILLARY: 132 mg/dL — AB (ref 65–99)
GLUCOSE-CAPILLARY: 117 mg/dL — AB (ref 65–99)
Glucose-Capillary: 139 mg/dL — ABNORMAL HIGH (ref 65–99)
Glucose-Capillary: 144 mg/dL — ABNORMAL HIGH (ref 65–99)

## 2014-08-30 LAB — HEMOGLOBIN A1C
Hgb A1c MFr Bld: 7.9 % — ABNORMAL HIGH (ref 4.8–5.6)
Mean Plasma Glucose: 180 mg/dL

## 2014-08-30 MED ORDER — INSULIN PUMP
Freq: Three times a day (TID) | SUBCUTANEOUS | Status: DC
  Filled 2014-08-30: qty 1

## 2014-08-30 NOTE — Progress Notes (Signed)
Orthopedic Tech Progress Note Patient Details:  [name] [birth year] [id number] [employer] called to place brace order Patient ID: [name], male   DOB: [birth year], 49 y.o.   MRN: [id number]   Fenton Foy 08/30/2014, 9:16 AM

## 2014-08-30 NOTE — Evaluation (Signed)
Physical Therapy Evaluation Patient Details Name: [name] MRN: [id number] DOB: Sep 08, 1965 Today's Date: 08/30/2014   History of Present Illness  pt presents with L5-S1 PLIF.    Clinical Impression  Pt very motivated to improve overall mobility and activity.  Pt demonstrates good recall and follow-through on back precautions and safety education.  Pt will have good support from friends and family at D/C and anticipate he will be able to return to home with HHPT.  Will continue to follow while on acute.      Follow Up Recommendations Home health PT;Supervision - Intermittent    Equipment Recommendations  Rolling walker with 5" wheels    Recommendations for Other Services       Precautions / Restrictions Precautions Precautions: Fall;Back Precaution Booklet Issued: Yes (comment) Required Braces or Orthoses: Spinal Brace Spinal Brace: Lumbar corset;Applied in sitting position Restrictions Weight Bearing Restrictions: No      Mobility  Bed Mobility Overal bed mobility: Needs Assistance Bed Mobility: Rolling;Sidelying to Sit Rolling: Supervision Sidelying to sit: Min guard       General bed mobility comments: cues for log roll technique and sequencing with coming to sitting.  No physical A needed.    Transfers Overall transfer level: Needs assistance Equipment used: Rolling walker (2 wheeled) Transfers: Sit to/from Stand Sit to Stand: Min guard         General transfer comment: cues for UE use and scooting to EOB prior to coming to stand.    Ambulation/Gait Ambulation/Gait assistance: Min guard Ambulation Distance (Feet): 300 Feet Assistive device: Rolling walker (2 wheeled) Gait Pattern/deviations: Step-through pattern;Decreased stride length     General Gait Details: cues for more upright psoture, decreased reliance on UEs, and more relaxed posture.    Stairs            Wheelchair Mobility    Modified Rankin (Stroke Patients Only)        Balance Overall balance assessment: No apparent balance deficits (not formally assessed)                                           Pertinent Vitals/Pain Pain Assessment: 0-10 Pain Score: 6  Pain Location: Back Pain Descriptors / Indicators: Aching Pain Intervention(s): Monitored during session;Premedicated before session;Repositioned    Home Living Family/patient expects to be discharged to:: Private residence Living Arrangements: Alone Available Help at Discharge: Friend(s);Available 24 hours/day Type of Home: House Home Access: Level entry     Home Layout: Two level;1/2 bath on main level;Able to live on main level with bedroom/bathroom Home Equipment: None      Prior Function Level of Independence: Independent               Hand Dominance   Dominant Hand: Right    Extremity/Trunk Assessment   Upper Extremity Assessment: Defer to OT evaluation           Lower Extremity Assessment: Overall WFL for tasks assessed      Cervical / Trunk Assessment: Normal  Communication   Communication: No difficulties  Cognition Arousal/Alertness: Awake/alert Behavior During Therapy: WFL for tasks assessed/performed Overall Cognitive Status: Within Functional Limits for tasks assessed                      General Comments      Exercises        Assessment/Plan  PT Assessment Patient needs continued PT services  PT Diagnosis Difficulty walking   PT Problem List Decreased activity tolerance;Decreased balance;Decreased mobility;Decreased knowledge of use of DME;Decreased knowledge of precautions;Pain  PT Treatment Interventions DME instruction;Gait training;Functional mobility training;Therapeutic activities;Therapeutic exercise;Balance training;Patient/family education   PT Goals (Current goals can be found in the Care Plan section) Acute Rehab PT Goals Patient Stated Goal: Walk normal again.   PT Goal Formulation: With  patient Time For Goal Achievement: 09/06/14 Potential to Achieve Goals: Good    Frequency Min 5X/week   Barriers to discharge        Co-evaluation               End of Session Equipment Utilized During Treatment: Gait belt;Back brace Activity Tolerance: Patient tolerated treatment well Patient left:  (Up with OT.) Nurse Communication: Mobility status         Time: [id number] PT Time Calculation (min) (ACUTE ONLY): 24 min   Charges:   PT Evaluation $Initial PT Evaluation Tier I: 1 Procedure PT Treatments $Gait Training: 8-22 mins   PT G CodesCatarina Hartshorn, [place] 08/30/2014, 1:58 PM

## 2014-08-30 NOTE — Progress Notes (Signed)
OT NOTE  Order in order set for brace currently. NO brace present in room. Please follow order set instructions to call ortho tech [phone])  to order brace. OT to return to complete evaluation upon brace arrival.  Ot provided back handout pending brace arrival   Jeri Modena   OTR/L Pager: [phone] Office: [phone] .  \

## 2014-08-30 NOTE — Evaluation (Signed)
Occupational Therapy Evaluation Patient Details Name: [name] MRN: [id number] DOB: Aug 21, 1965 Today's Date: 08/30/2014    History of Present Illness pt presents with L5-S1 PLIF.     Clinical Impression   Patient is s/p L5-S1 PLIF surgery resulting in functional limitations due to the deficits listed below (see OT problem list). Education provided on precautions and maintaining precautions during ADLs. Pt. Reports friend will A with ADLs upon return home. Pt. Able to perform grooming activities standing at sink with cuing to maintain precautions. Pt. requires A with LB dressing and bathing, would benefit from AE for LB dressing. Patient will benefit from skilled OT acutely to increase independence and safety with ADLS to allow discharge home.     Follow Up Recommendations  Home health OT    Equipment Recommendations  3 in 1 bedside comode    Recommendations for Other Services       Precautions / Restrictions Precautions Precautions: Fall;Back Precaution Booklet Issued: Yes (comment) Required Braces or Orthoses: Spinal Brace Spinal Brace: Lumbar corset;Applied in sitting position Restrictions Weight Bearing Restrictions: No      Mobility Bed Mobility Overal bed mobility: Needs Assistance Bed Mobility: Rolling;Sidelying to Sit Rolling: Supervision Sidelying to sit: Min guard       General bed mobility comments: cues for log roll technique and sequencing with coming to sitting.  No physical A needed.    Transfers Overall transfer level: Needs assistance Equipment used: Rolling walker (2 wheeled) Transfers: Sit to/from Stand Sit to Stand: Min guard         General transfer comment: cues for UE use and scooting to EOB prior to coming to stand.      Balance Overall balance assessment: No apparent balance deficits (not formally assessed)                                          ADL Overall ADL's : Needs assistance/impaired Eating/Feeding:  Independent   Grooming: Wash/dry hands;Wash/dry face;Oral care;Supervision/safety;Cueing for safety;Standing   Upper Body Bathing: Minimal assitance;Sitting Upper Body Bathing Details (indicate cue type and reason): pt. reports they will sponge bathe upon d/c due to full bath location on second floor of home Lower Body Bathing: Minimal assistance;With adaptive equipment;Adhering to back precautions;Sit to/from stand Lower Body Bathing Details (indicate cue type and reason): pt. reports friend will A with LB bathing Upper Body Dressing : Supervision/safety;Sitting (don brace)   Lower Body Dressing: Minimal assistance;With adaptive equipment;Sit to/from stand;Cueing for safety   Toilet Transfer: Min guard;BSC;RW   Toileting- Clothing Manipulation and Hygiene: Supervision/safety;Sit to/from stand   Tub/ Shower Transfer: Tub transfer;Adhering to back precautions;Rolling walker;Ambulation;Shower Scientist, research (medical) Details (indicate cue type and reason): tub/shower located on second floor of pt's home, pt reports will not use at this time Functional mobility during ADLs: Min guard;Rolling walker General ADL Comments: Pt. lives alone, but reports friend will move in to assist with ADLs at this time. Pt. reports they will use half bath on main level and sponge bathe upon return home.     Vision     Perception     Praxis      Pertinent Vitals/Pain Pain Assessment: 0-10 Pain Score: 6  Pain Location: Back Pain Descriptors / Indicators: Aching Pain Intervention(s): Monitored during session;Premedicated before session;Repositioned     Hand Dominance Right   Extremity/Trunk Assessment Upper Extremity Assessment Upper Extremity Assessment: Defer to  OT evaluation   Lower Extremity Assessment Lower Extremity Assessment: Overall WFL for tasks assessed   Cervical / Trunk Assessment Cervical / Trunk Assessment: Normal   Communication Communication Communication: No difficulties    Cognition Arousal/Alertness: Awake/alert Behavior During Therapy: WFL for tasks assessed/performed Overall Cognitive Status: Within Functional Limits for tasks assessed                     General Comments       Exercises       Shoulder Instructions      Home Living Family/patient expects to be discharged to:: Private residence Living Arrangements: Alone Available Help at Discharge: Friend(s);Available 24 hours/day Type of Home: House Home Access: Level entry     Home Layout: Two level;1/2 bath on main level;Able to live on main level with bedroom/bathroom     Bathroom Shower/Tub: Tub/shower unit;Curtain (pt. reports plan to use half bath on main level upon d/c) Shower/tub characteristics: Curtain Biochemist, clinical: Standard (pt. reports toilet is low)     Home Equipment: None          Prior Functioning/Environment Level of Independence: Independent             OT Diagnosis: Acute pain;Generalized weakness   OT Problem List: Decreased strength;Decreased knowledge of use of DME or AE;Decreased knowledge of precautions;Obesity;Pain   OT Treatment/Interventions: Self-care/ADL training;Therapeutic exercise;DME and/or AE instruction;Therapeutic activities;Patient/family education    OT Goals(Current goals can be found in the care plan section) Acute Rehab OT Goals Patient Stated Goal: to sit in chair OT Goal Formulation: With patient Time For Goal Achievement: 09/13/14 Potential to Achieve Goals: Good ADL Goals Pt Will Perform Lower Body Dressing: with min assist;with adaptive equipment;sit to/from stand Pt Will Transfer to Toilet: with modified independence;ambulating;bedside commode Additional ADL Goal #1: Pt. will verbalize 3/3 precautions in preparation for ADLs  OT Frequency: Min 2X/week   Barriers to D/C:            Co-evaluation              End of Session Equipment Utilized During Treatment: Gait belt;Rolling walker;Back  brace  Activity Tolerance: Patient tolerated treatment well;No increased pain Patient left: in chair;with call bell/phone within reach   Time: 1130-1200 OT Time Calculation (min): 30 min Charges:  OT General Charges $OT Visit: 1 Procedure OT Evaluation $Initial OT Evaluation Tier I: 1 Procedure OT Treatments $Self Care/Home Management : 8-22 mins G-Codes:    Forest Gleason 08/30/2014, 1:57 PM

## 2014-08-30 NOTE — Progress Notes (Signed)
Patient ID: [name], male   DOB: June 29, 1965, 49 y.o.   MRN: [id number] BP 133/64 mmHg  Pulse 99  Temp(Src) 99.8 F (37.7 C) (Oral)  Resp 18  Ht 5\' 11"  (1.803 m)  Wt 126.1 kg (278 lb)  BMI 38.79 kg/m2  SpO2 98% Alert and oriented x 4 Moving all extremities well Wound is clean, dry, and without signs of infection Doing very well.  Continue PT

## 2014-08-30 NOTE — Progress Notes (Signed)
PT Cancellation Note  Patient Details Name: [name] MRN: [id number] DOB: 1965-05-30   Cancelled Treatment:    Reason Eval/Treat Not Completed: Other (comment)  Awaiting brace to be delivered this am.  Will f/u once pt has brace.     [name], Thornton Papas 08/30/2014, 8:10 AM

## 2014-08-30 NOTE — Progress Notes (Signed)
Inpatient Diabetes Program Recommendations  AACE/ADA: New Consensus Statement on Inpatient Glycemic Control (2013)  Target Ranges:  Prepandial:   less than 140 mg/dL      Peak postprandial:   less than 180 mg/dL (1-2 hours)      Critically ill patients:  140 - 180 mg/dL   Reason for Visit: Diabetes Consult  Diabetes history: DM2  Outpatient Diabetes medications: insulin pump Current orders for Inpatient glycemic control: insulin pump + Novolog resistant tidwc  49 year old male admitted for spinal fusion. Recently started on insulin pump per Dr. Criss Rosales. States blood sugars are much improved in the past 6 weeks. CBGs ranging from 69-120s. Has pump on but in suspend mode. Blood sugars very well controlled with Novolog resistant correction. Pt agrees to keep pump in suspend and continue with correction insulin since blood sugars are so well controlled. Continues to work on diet, exercise and stress management.  Very motivated to make lifestyle changes to control blood sugars. Sees Dr. Criss Rosales every 3 months for diabetes management.   Results for [name], [name] (MRN [id number]) as of 08/30/2014 16:43  Ref. Range 08/29/2014 19:22 08/29/2014 23:03 08/30/2014 06:50 08/30/2014 12:07 08/30/2014 16:31  Glucose-Capillary Latest Ref Range: 65-99 mg/dL 103 (H) 114 (H) 132 (H) 139 (H) 144 (H)   Results for [name], [name] (MRN [id number]) as of 08/30/2014 16:43  Ref. Range 08/29/2014 20:41  Hemoglobin A1C Latest Ref Range: 4.8-5.6 % 7.9 (H)   Improving HgbA1C. Expect good compliance with blood sugar control.  Thank you. Lorenda Peck, RD, LDN, CDE Inpatient Diabetes Coordinator [phone]

## 2014-08-30 NOTE — Clinical Social Work Note (Addendum)
CSW Consult Acknowledged:   CSW received a consult for SNF placement. CSW awaiting PT/OT evaluation to determine the appropriate level of care.    Addendum: Per PT's note the appropriate level of care is [place] will sign off.     [place], MSW, [place]

## 2014-08-31 LAB — GLUCOSE, CAPILLARY
GLUCOSE-CAPILLARY: 135 mg/dL — AB (ref 65–99)
Glucose-Capillary: 149 mg/dL — ABNORMAL HIGH (ref 65–99)
Glucose-Capillary: 117 mg/dL — ABNORMAL HIGH (ref 65–99)
Glucose-Capillary: 145 mg/dL — ABNORMAL HIGH (ref 65–99)

## 2014-08-31 NOTE — Progress Notes (Signed)
Patient ID: [name], male   DOB: [birth year], 49 y.o.   MRN: [id number] BP 119/58 mmHg  Pulse 95  Temp(Src) 99.5 F (37.5 C) (Oral)  Resp 20  Ht 5\' 11"  (1.803 m)  Wt 126.1 kg (278 lb)  BMI 38.79 kg/m2  SpO2 98% Alert and oriented x 4, speech is clear and fluent Moving all extremities well Possible discharge tomorrow.

## 2014-08-31 NOTE — Progress Notes (Signed)
Occupational Therapy Treatment Patient Details Name: [name] MRN: [id number] DOB: [birth year] Today's Date: 08/31/2014    History of present illness pt presents with L5-S1 PLIF.     OT comments  Pt. Complains of feeling slower today with increased pain, but is doing well maintaining precautions during functional activities.  Education provided on use of AE for LB dressing, and bed mobility to decrease pain when rolling.  Pt. Asking about shower precautions. Shower at home is located on second floor, with narrow steps. Pt would benefit from practicing tub/shower transfer is able to maneuver steps with PT.  Follow Up Recommendations  Home health OT    Equipment Recommendations  3 in 1 bedside comode    Recommendations for Other Services      Precautions / Restrictions Precautions Precautions: Fall;Back Precaution Comments: pt verbalized 3/3 precautions Required Braces or Orthoses: Spinal Brace Spinal Brace: Lumbar corset;Applied in sitting position Restrictions Weight Bearing Restrictions: No       Mobility Bed Mobility Overal bed mobility: Modified Independent Bed Mobility: Rolling;Sidelying to Sit Rolling: Modified independent (Device/Increase time) Sidelying to sit: Modified independent (Device/Increase time)       General bed mobility comments: pt. reports pain when rolling to side.  education and practice with rolling completed during session.  Transfers Overall transfer level: Needs assistance Equipment used: Rolling walker (2 wheeled) Transfers: Sit to/from Stand Sit to Stand: Min guard              Balance Overall balance assessment: No apparent balance deficits (not formally assessed)                                 ADL Overall ADL's : Needs assistance/impaired     Grooming: Wash/dry hands;Wash/dry face;Oral care;Supervision/safety;Standing Grooming Details (indicate cue type and reason): pt maintained precautions during task          Upper Body Dressing : Supervision/safety;Sitting (don brace)   Lower Body Dressing: With adaptive equipment;Adhering to back precautions;Sit to/from stand;Min guard Management consultant)   Toilet Transfer: Min guard;BSC;RW   Toileting- Clothing Manipulation and Hygiene: Supervision/safety;Sit to/from stand       Functional mobility during ADLs: Min guard;Rolling walker General ADL Comments: Pt. educated on AE for LB dressing. Pt. asking about shower precautions. Shower at home is upstairs, will ask PT about stair practice.       Vision                     Perception     Praxis      Cognition   Behavior During Therapy: WFL for tasks assessed/performed Overall Cognitive Status: Within Functional Limits for tasks assessed                       Extremity/Trunk Assessment               Exercises     Shoulder Instructions       General Comments      Pertinent Vitals/ Pain       Pain Assessment: 0-10 Pain Score: 10-Worst pain ever Pain Location: back Pain Descriptors / Indicators: Aching Pain Intervention(s): Monitored during session;Repositioned;RN gave pain meds during session  Home Living  Prior Functioning/Environment              Frequency Min 2X/week     Progress Toward Goals  OT Goals(current goals can now be found in the care plan section)  Progress towards OT goals: Progressing toward goals  Acute Rehab OT Goals Patient Stated Goal: to go home OT Goal Formulation: With patient Potential to Achieve Goals: Good ADL Goals Pt Will Perform Lower Body Dressing: with min assist;with adaptive equipment;sit to/from stand Pt Will Transfer to Toilet: with modified independence;ambulating;bedside commode Additional ADL Goal #1: Pt. will verbalize 3/3 precautions in preparation for ADLs  Plan Discharge plan remains appropriate    Co-evaluation                 End of  Session Equipment Utilized During Treatment: Gait belt;Rolling walker;Back brace   Activity Tolerance Patient tolerated treatment well;No increased pain   Patient Left in chair;with call bell/phone within reach   Nurse Communication          Time: [id number] OT Time Calculation (min): 37 min  Charges: OT General Charges $OT Visit: 1 Procedure OT Treatments $Self Care/Home Management : 23-37 mins  Forest Gleason 08/31/2014, 9:16 AM

## 2014-08-31 NOTE — Progress Notes (Signed)
Physical Therapy Treatment Patient Details Name: [name] MRN: [id number] DOB: 11-25-1965 Today's Date: 08/31/2014    History of Present Illness pt presents with L5-S1 PLIF.      PT Comments    Pt moving well today and without need for A.  Pt able to recall back precautions and education given to him during previous sessions.  Pt ed on car transfer and progressing mobility at home, as well as encouraging more ambulation with Nsg staff while on acute.  Will continue to follow.    Follow Up Recommendations  Home health PT;Supervision - Intermittent     Equipment Recommendations  Rolling walker with 5" wheels    Recommendations for Other Services       Precautions / Restrictions Precautions Precautions: Fall;Back Precaution Comments: pt verbalized 3/3 precautions Required Braces or Orthoses: Spinal Brace Spinal Brace: Lumbar corset;Applied in sitting position Restrictions Weight Bearing Restrictions: No    Mobility  Bed Mobility               General bed mobility comments: pt sitting in recliner.  Transfers Overall transfer level: Needs assistance Equipment used: Rolling walker (2 wheeled) Transfers: Sit to/from Stand Sit to Stand: Supervision         General transfer comment: pt demonstrates good return on education from previous sessions for use of UEs.    Ambulation/Gait Ambulation/Gait assistance: Supervision Ambulation Distance (Feet): 300 Feet Assistive device: Rolling walker (2 wheeled) Gait Pattern/deviations: Step-through pattern;Decreased stride length     General Gait Details: Continued cues to relax UEs and decrease reliance on RW.     Stairs            Wheelchair Mobility    Modified Rankin (Stroke Patients Only)       Balance Overall balance assessment: No apparent balance deficits (not formally assessed)                                  Cognition Arousal/Alertness: Awake/alert Behavior During Therapy:  WFL for tasks assessed/performed Overall Cognitive Status: Within Functional Limits for tasks assessed                      Exercises      General Comments        Pertinent Vitals/Pain Pain Assessment: 0-10 Pain Score: 5  Pain Location: Back Pain Descriptors / Indicators: Aching Pain Intervention(s): Monitored during session;Premedicated before session;Repositioned    Home Living                      Prior Function            PT Goals (current goals can now be found in the care plan section) Acute Rehab PT Goals Patient Stated Goal: to go home PT Goal Formulation: With patient Time For Goal Achievement: 09/06/14 Potential to Achieve Goals: Good Progress towards PT goals: Progressing toward goals    Frequency  Min 5X/week    PT Plan Current plan remains appropriate    Co-evaluation             End of Session Equipment Utilized During Treatment: Gait belt;Back brace Activity Tolerance: Patient tolerated treatment well Patient left: in chair;with call bell/phone within reach     Time: [id number] PT Time Calculation (min) (ACUTE ONLY): 15 min  Charges:  $Gait Training: 8-22 mins  G CodesCatarina Hartshorn, [place] 08/31/2014, 12:18 PM

## 2014-09-01 LAB — GLUCOSE, CAPILLARY
Glucose-Capillary: 136 mg/dL — ABNORMAL HIGH (ref 65–99)
Glucose-Capillary: 142 mg/dL — ABNORMAL HIGH (ref 65–99)

## 2014-09-01 MED ORDER — CYCLOBENZAPRINE HCL 10 MG PO TABS: 10.0000 mg | ORAL_TABLET | Freq: Three times a day (TID) | ORAL | Status: DC | PRN

## 2014-09-01 MED ORDER — OXYCODONE-ACETAMINOPHEN 5-325 MG PO TABS: 1.0000 | ORAL_TABLET | Freq: Four times a day (QID) | ORAL | Status: DC | PRN

## 2014-09-01 NOTE — Progress Notes (Signed)
Occupational Therapy Treatment Patient Details Name: [name] MRN: [id number] DOB: 1965-06-04 Today's Date: 09/01/2014    History of present illness pt presents with L5-S1 PLIF.     OT comments  Pt able to perform tub transfer with supervision.  He demonstrates good understanding of back precautions.   Follow Up Recommendations  Home health OT    Equipment Recommendations  3 in 1 bedside comode    Recommendations for Other Services      Precautions / Restrictions Precautions Precautions: Fall;Back Precaution Comments: pt verbalized 3/3 precautions Required Braces or Orthoses: Spinal Brace Spinal Brace: Lumbar corset;Applied in sitting position       Mobility Bed Mobility                  Transfers Overall transfer level: Needs assistance Equipment used: Rolling walker (2 wheeled) Transfers: Sit to/from [facility] Sit to Stand: Supervision Stand pivot transfers: Supervision            Balance                                   ADL Overall ADL's : Needs assistance/impaired               Lower Body Bathing Details (indicate cue type and reason): Discussed use of LH to bath LB.  Pt reports he plans to buy AE in gift shop                  Tub/ Shower Transfer: Tub transfer;Supervision/safety;Ambulation;Rolling walker Tub/Shower Transfer Details (indicate cue type and reason): Pt demonstrates good technique  Functional mobility during ADLs: Supervision/safety;Rolling walker General ADL Comments: Pt reports he feels confident with ADLs and back precautions       Vision                     Perception     Praxis      Cognition   Behavior During Therapy: WFL for tasks assessed/performed Overall Cognitive Status: Within Functional Limits for tasks assessed                       Extremity/Trunk Assessment               Exercises     Shoulder Instructions       General  Comments      Pertinent Vitals/ Pain       Pain Assessment: Faces Faces Pain Scale: Hurts little more Pain Location: back  Pain Descriptors / Indicators: Aching;Guarding Pain Intervention(s): Monitored during session  Home Living                                          Prior Functioning/Environment              Frequency Min 2X/week     Progress Toward Goals  OT Goals(current goals can now be found in the care plan section)  Progress towards OT goals: Progressing toward goals  ADL Goals Pt Will Perform Lower Body Dressing: with min assist;with adaptive equipment;sit to/from stand Pt Will Transfer to Toilet: with modified independence;ambulating;bedside commode Additional ADL Goal #1: Pt. will verbalize 3/3 precautions in preparation for ADLs  Plan Discharge plan remains appropriate    Co-evaluation  End of Session Equipment Utilized During Treatment: Rolling walker;Back brace   Activity Tolerance Patient tolerated treatment well   Patient Left in chair;with call bell/phone within reach;with family/visitor present   Nurse Communication          Time: [id number] OT Time Calculation (min): 18 min  Charges: OT General Charges $OT Visit: 1 Procedure OT Treatments $Self Care/Home Management : 8-22 mins  [name] M 09/01/2014, 12:07 PM

## 2014-09-01 NOTE — Care Management Note (Addendum)
Case Management Note  Patient Details  Name: [name] MRN: [id number] Date of Birth: [birth year]  Subjective/Objective:  49 yo M underwent L5-S1 PLIF.                 Action/Plan: received referral to assist with HHPT and DME ([facility], 3 in 1 [facility])   Expected Discharge Date:  09/01/14                Expected Discharge Plan:  [place] (admitted from home alone)  In-House Referral:     Discharge planning Services  CM Consult  Post Acute Care Choice:  Durable Medical Equipment, Home Health Choice offered to:  Patient  DME Arranged:  3-N-1, Walker rolling DME Agency:  [place]:  PT [place]:  [place]  Status of Service:  Completed, signed off  Medicare Important Message Given:    Date Medicare IM Given:    Medicare IM give by:    Date Additional Medicare IM Given:    Additional Medicare Important Message give by:     If discussed at [place] of Stay Meetings, dates discussed:    Additional Comments: met with pt and friend to discuss D/C plan and referral for HHPT and DME ([facility], 3 in 1 [facility]). He plans to return home with the support of his friend. He doesn't have a preference for a [place] agency. Provided pt with a list of [place] agencies. He prefers to use [facility] for HHPT and DME. He stated that his mother had used [facility] in the past. Shireen Quan and Jeneen Rinks at [facility] for referral.  Norina Buzzard, RN 09/01/2014, 11:07 AM

## 2014-09-01 NOTE — Discharge Summary (Signed)
  Physician Discharge Summary  Patient ID: [name] MRN: [id number] DOB/AGE: 47-Apr-1967 47 y.o.  Admit date: 08/29/2014 Discharge date: 09/01/2014  Admission Diagnoses:lumbar facet arthropathy  Discharge Diagnoses:  Active Problems:   Lumbar facet arthropathy   Discharged Condition: good  Hospital Course: [name] is a 49 y.o. male Whom was admitted and taken to the operating room for an uncomplicated lumbar decompression and interbody arthrodesis for a large arthritic facet causing L5, and S1 compression on the left side. I used structural allograft and Nuvasive mas plif hardware. His wound at discharge is clean, dry, and without signs of infection. He is ambulating, voiding, and tolerating a regular diet at discharge. He is moving all extremities normally.   Treatments: surgery: POSTERIOR LUMBAR interbody arthrodesis, structural allograft 48mm(synthes)   Posterolateral arthrodesis L5/S1 morselized allograft and autograft Non segmental pedicle screw fixAtion on the left Nuvasive hardware   Discharge Exam: Blood pressure 96/47, pulse 106, temperature 98 F (36.7 C), temperature source Oral, resp. rate 18, height 5\' 11"  (1.803 m), weight 126.1 kg (278 lb), SpO2 96 %. General appearance: alert, cooperative and appears stated age Neurologic: Alert and oriented X 3, normal strength and tone. Normal symmetric reflexes. Normal coordination and gait  Disposition: 01-Home or Self Care lumbar herniated disc    Medication List    TAKE these medications        clonazePAM 2 MG tablet  Commonly known as:  KLONOPIN  Take 2 mg by mouth at bedtime.     cyclobenzaprine 10 MG tablet  Commonly known as:  FLEXERIL  Take 1 tablet (10 mg total) by mouth 3 (three) times daily as needed for muscle spasms.     DULoxetine 60 MG capsule  Commonly known as:  CYMBALTA  Take 60 mg by mouth daily.     gabapentin 300 MG capsule  Commonly known as:  NEURONTIN  Take 300 mg by mouth 4  (four) times daily.     HYDROcodone-acetaminophen 7.5-325 MG per tablet  Commonly known as:  NORCO  Take 1 tablet by mouth every 4 (four) hours as needed for moderate pain.     insulin pump Soln  Inject 75 each into the skin 3 times daily with meals, bedtime and 2 AM. Novolog     metformin 1000 MG (OSM) 24 hr tablet  Commonly known as:  FORTAMET  Take 1,000 mg by mouth at bedtime.     metoprolol tartrate 25 MG tablet  Commonly known as:  LOPRESSOR  Take 25 mg by mouth 2 (two) times daily.     oxyCODONE-acetaminophen 5-325 MG per tablet  Commonly known as:  ROXICET  Take 1 tablet by mouth every 6 (six) hours as needed for severe pain.     sitaGLIPtin-metformin 50-1000 MG per tablet  Commonly known as:  JANUMET  Take 1 tablet by mouth every morning.     valsartan 160 MG tablet  Commonly known as:  DIOVAN  Take 160 mg by mouth daily.           Follow-up Information    Follow up with Hanan Moen L, MD In 3 weeks.   Specialty:  Neurosurgery   Why:  call to make an appointment   Contact information:   [address] [place] [address] [phone]       Signed: Winfield Cunas 09/01/2014, 9:36 AM

## 2014-09-01 NOTE — Progress Notes (Signed)
Physical Therapy Treatment Patient Details Name: [name] MRN: [id number] DOB: [birth year] Today's Date: 09/19/14    History of Present Illness pt presents with L5-S1 PLIF.      PT Comments    Patient making good gains with functional mobility and gait.  Able to maintain back precautions.  Patient to have f/u HHPT at d/c.  Follow Up Recommendations  Home health PT;Supervision - Intermittent     Equipment Recommendations  Rolling walker with 5" wheels    Recommendations for Other Services       Precautions / Restrictions Precautions Precautions: Fall;Back Precaution Comments: pt verbalized 3/3 precautions Required Braces or Orthoses: Spinal Brace Spinal Brace: Lumbar corset;Applied in sitting position Restrictions Weight Bearing Restrictions: No    Mobility  Bed Mobility               General bed mobility comments: Patient in chair as PT entered room  Transfers Overall transfer level: Needs assistance Equipment used: Rolling walker (2 wheeled) Transfers: Sit to/from Stand Sit to Stand: Supervision         General transfer comment: Patient using proper technique for transfers.  Assist for safety only.  Ambulation/Gait Ambulation/Gait assistance: Supervision Ambulation Distance (Feet): 400 Feet Assistive device: Rolling walker (2 wheeled) Gait Pattern/deviations: Step-through pattern;Decreased stride length     General Gait Details: Demonstrates safe use of RW.  Less dependence on RW.   Stairs            Wheelchair Mobility    Modified Rankin (Stroke Patients Only)       Balance                                    Cognition Arousal/Alertness: Awake/alert Behavior During Therapy: WFL for tasks assessed/performed Overall Cognitive Status: Within Functional Limits for tasks assessed                      Exercises      General Comments        Pertinent Vitals/Pain Pain Assessment: 0-10 Pain Score: 3   Pain Location: back Pain Descriptors / Indicators: Aching;Sore Pain Intervention(s): Monitored during session;Repositioned    Home Living                      Prior Function            PT Goals (current goals can now be found in the care plan section) Progress towards PT goals: Progressing toward goals    Frequency  Min 5X/week    PT Plan Current plan remains appropriate    Co-evaluation             End of Session Equipment Utilized During Treatment: Gait belt;Back brace Activity Tolerance: Patient tolerated treatment well Patient left: in chair;with call bell/phone within reach;with family/visitor present     Time: [id number] PT Time Calculation (min) (ACUTE ONLY): 19 min  Charges:  $Gait Training: 8-22 mins                    G Codes:      Despina Pole 19-Sep-2014, 4:00 PM Carita Pian. Sanjuana Kava, [place] Pager [phone]

## 2014-09-01 NOTE — Discharge Instructions (Signed)

## 2014-09-01 NOTE — Progress Notes (Signed)
DC instructions given to patient and friend. All questions answered. Patient in [facility] to be escorted to lobby by staff. Friend to transport home in private vehicle.

## 2014-09-14 ENCOUNTER — Other Ambulatory Visit: Payer: Self-pay | Admitting: Neurosurgery

## 2014-09-14 DIAGNOSIS — M5417 Radiculopathy, lumbosacral region: Secondary | ICD-10-CM

## 2014-10-09 ENCOUNTER — Ambulatory Visit (INDEPENDENT_AMBULATORY_CARE_PROVIDER_SITE_OTHER): Payer: BLUE CROSS/BLUE SHIELD

## 2014-10-09 DIAGNOSIS — M545 Low back pain: Secondary | ICD-10-CM

## 2014-10-09 DIAGNOSIS — M5417 Radiculopathy, lumbosacral region: Secondary | ICD-10-CM

## 2015-02-07 ENCOUNTER — Ambulatory Visit: Payer: BLUE CROSS/BLUE SHIELD | Attending: Family Medicine

## 2015-02-11 ENCOUNTER — Ambulatory Visit: Payer: BLUE CROSS/BLUE SHIELD

## 2016-01-29 ENCOUNTER — Encounter: Payer: Self-pay | Admitting: Family Medicine

## 2016-01-29 ENCOUNTER — Ambulatory Visit (INDEPENDENT_AMBULATORY_CARE_PROVIDER_SITE_OTHER): Payer: Self-pay | Admitting: Family Medicine

## 2016-01-29 VITALS — BP 199/108 | HR 98 | Temp 98.5°F | Resp 16 | Ht 70.0 in | Wt 245.0 lb

## 2016-01-29 DIAGNOSIS — Z23 Encounter for immunization: Secondary | ICD-10-CM

## 2016-01-29 DIAGNOSIS — E118 Type 2 diabetes mellitus with unspecified complications: Secondary | ICD-10-CM

## 2016-01-29 DIAGNOSIS — E1165 Type 2 diabetes mellitus with hyperglycemia: Secondary | ICD-10-CM | POA: Insufficient documentation

## 2016-01-29 DIAGNOSIS — E119 Type 2 diabetes mellitus without complications: Secondary | ICD-10-CM | POA: Insufficient documentation

## 2016-01-29 DIAGNOSIS — I10 Essential (primary) hypertension: Secondary | ICD-10-CM | POA: Insufficient documentation

## 2016-01-29 LAB — CBC WITH DIFFERENTIAL/PLATELET
Basophils Absolute: 43 cells/uL (ref 0–200)
Basophils Relative: 1 %
EOS PCT: 1 %
Eosinophils Absolute: 43 cells/uL (ref 15–500)
HCT: 48.5 % (ref 38.5–50.0)
HEMOGLOBIN: 15.7 g/dL (ref 13.2–17.1)
Lymphocytes Relative: 38 %
Lymphs Abs: 1634 cells/uL (ref 850–3900)
MCH: 30.1 pg (ref 27.0–33.0)
MCHC: 32.4 g/dL (ref 32.0–36.0)
MCV: 93.1 fL (ref 80.0–100.0)
MPV: 12 fL (ref 7.5–12.5)
Monocytes Absolute: 473 cells/uL (ref 200–950)
Monocytes Relative: 11 %
NEUTROS ABS: 2107 {cells}/uL (ref 1500–7800)
Neutrophils Relative %: 49 %
PLATELETS: 124 10*3/uL — AB (ref 140–400)
RBC: 5.21 MIL/uL (ref 4.20–5.80)
RDW: 13.6 % (ref 11.0–15.0)
WBC: 4.3 10*3/uL (ref 3.8–10.8)

## 2016-01-29 LAB — POCT GLYCOSYLATED HEMOGLOBIN (HGB A1C): Hemoglobin A1C: 13.1

## 2016-01-29 LAB — LIPID PANEL
Cholesterol: 240 mg/dL — ABNORMAL HIGH (ref 125–200)
HDL: 48 mg/dL (ref >=40)
LDL CALC: 144 mg/dL — AB (ref <130)
Total CHOL/HDL Ratio: 5 Ratio (ref <=5.0)
Triglycerides: 241 mg/dL — ABNORMAL HIGH (ref <150)
VLDL: 48 mg/dL — ABNORMAL HIGH (ref <30)

## 2016-01-29 LAB — COMPLETE METABOLIC PANEL WITH GFR
ALBUMIN: 3.7 g/dL (ref 3.6–5.1)
ALK PHOS: 68 U/L (ref 40–115)
ALT: 173 U/L — ABNORMAL HIGH (ref 9–46)
AST: 130 U/L — ABNORMAL HIGH (ref 10–40)
BILIRUBIN TOTAL: 0.5 mg/dL (ref 0.2–1.2)
BUN: 11 mg/dL (ref 7–25)
CO2: 27 mmol/L (ref 20–31)
Calcium: 9.4 mg/dL (ref 8.6–10.3)
Chloride: 97 mmol/L — ABNORMAL LOW (ref 98–110)
Creat: 0.73 mg/dL (ref 0.60–1.35)
GFR, Est African American: >89 mL/min (ref >=60)
GFR, Est Non African American: >89 mL/min (ref >=60)
Glucose, Bld: 358 mg/dL — ABNORMAL HIGH (ref 65–99)
Potassium: 4.4 mmol/L (ref 3.5–5.3)
SODIUM: 134 mmol/L — AB (ref 135–146)
TOTAL PROTEIN: 7.7 g/dL (ref 6.1–8.1)

## 2016-01-29 LAB — GLUCOSE, CAPILLARY: GLUCOSE-CAPILLARY: 309 mg/dL — AB (ref 65–99)

## 2016-01-29 MED ORDER — METOPROLOL TARTRATE 25 MG PO TABS: 25.0000 mg | ORAL_TABLET | Freq: Two times a day (BID) | ORAL | 1 refills | Status: DC

## 2016-01-29 MED ORDER — LISINOPRIL 20 MG PO TABS: 20.0000 mg | ORAL_TABLET | Freq: Every day | ORAL | 1 refills | Status: DC

## 2016-01-29 MED ORDER — GLIMEPIRIDE 4 MG PO TABS
4.0000 mg | ORAL_TABLET | Freq: Every day | ORAL | 1 refills | Status: DC
Start: 2016-01-29 — End: 2019-05-10

## 2016-01-29 MED ORDER — METFORMIN HCL 1000 MG PO TABS
1000.0000 mg | ORAL_TABLET | Freq: Two times a day (BID) | ORAL | 3 refills | Status: DC
Start: 2016-01-29 — End: 2019-08-14

## 2016-01-29 NOTE — Patient Instructions (Signed)
For now am starting metformin 1000 twice a day and glimeperide 4 mg once a day for diabetes. Will recheck and make adjustments in 3 months.  Am changing diovan to lisinopril and keeping metoprolol for BP. I need for you to come in for nurse visit to check BP in 2 weeks.

## 2016-01-29 NOTE — Progress Notes (Signed)
[name], is a 50 y.o. male  [id number]  [id number]  DOB - 07/07/1965  CC:  Chief Complaint  Patient presents with  . Establish Care  . Hypertension  . Diabetes       HPI: [name] is a 49 y.o. male here to establish care and receive treatment for diabetes and hypertension. He has had no medications for these conditions for about a year due to lack of funds. He has recently started a new job and should have insurance soon. He was previously on an insulin pump, most recently on Metformin 1000 extended release. He has been on Diovan and Bystolic for blood pressure.  His A1C today is 13+ and his BP is 199/108. His Blood sugar today is 309 non-fasting. He reports when he checks at home they are generally in high 200's to mid 300's. He also has a history of a dysrhythmia, GERD, Sleep Apnea, Hepatitis. Other chronic conditions are listed in PMH. He reports following a diabetic diet and walking 30-60 minutes a day. He has not smoked in 7 years. He drinks 6 beers a week.   Health Maintenance: To receive flu shot today. He reports Tdap is current.   No Known Allergies Past Medical History:  Diagnosis Date  . Anxiety    Panic Atacks  . Arthritis   . Carpal tunnel syndrome   . Constipation   . Depression   . Diabetes mellitus    DMII  . Dysrhythmia    hx tachy hr  . GERD (gastroesophageal reflux disease)    no problem in past few years.  . Hepatitis   . Hypertension   . Neuropathy associated with endocrine disorder ([place])   . Seizures ([place])    "stress related" no more since taking Klonopin 2 years- See Neurologist  [facility]  . Shortness of breath dyspnea    with exertion  . Sleep apnea    used a cpap when he was 350lb-does not need one now 260lb  . Wears contact lenses    Current Outpatient Prescriptions on File Prior to Visit  Medication Sig Dispense Refill  . clonazePAM (KLONOPIN) 2 MG tablet Take 2 mg by mouth at bedtime.    . cyclobenzaprine (FLEXERIL)  10 MG tablet Take 1 tablet (10 mg total) by mouth 3 (three) times daily as needed for muscle spasms. (Patient not taking: Reported on 01/29/2016) 60 tablet 0  . DULoxetine (CYMBALTA) 60 MG capsule Take 60 mg by mouth daily.    Marland Kitchen gabapentin (NEURONTIN) 300 MG capsule Take 300 mg by mouth 4 (four) times daily.    Marland Kitchen HYDROcodone-acetaminophen (NORCO) 7.5-325 MG per tablet Take 1 tablet by mouth every 4 (four) hours as needed for moderate pain.     . Insulin Human (INSULIN PUMP) SOLN Inject 75 each into the skin 3 times daily with meals, bedtime and 2 AM. Novolog    . oxyCODONE-acetaminophen (ROXICET) 5-325 MG per tablet Take 1 tablet by mouth every 6 (six) hours as needed for severe pain. (Patient not taking: Reported on 01/29/2016) 80 tablet 0  . sitaGLIPtin-metformin (JANUMET) 50-1000 MG per tablet Take 1 tablet by mouth every morning.     No current facility-administered medications on file prior to visit.    Family History  Problem Relation Age of Onset  . Cancer Mother     pancres  . Diabetes Mother   . Hypertension Brother   . Renal cancer Brother   . Cancer Maternal Uncle    Social History  Social History  . Marital status: Single    Spouse name: N/A  . Number of children: N/A  . Years of education: N/A   Occupational History  . Not on file.   Social History Main Topics  . Smoking status: Former Smoker    Quit date: 10/27/2008  . Smokeless tobacco: Never Used  . Alcohol use 1.2 oz/week    2 Shots of liquor per week     Comment: occ2-3 x week  . Drug use: No     Comment: Last time 08/14/14-   . Sexual activity: No   Other Topics Concern  . Not on file   Social History Narrative  . No narrative on file    Review of Systems: Constitutional: Negative Skin:  + healing wound on left shin.  HENT: Negative  Eyes: Negative  (wears glasses) Neck: Negative Respiratory: Negative Cardiovascular: Negative Gastrointestinal: Negative Genitourinary: + for  frequency Musculoskeletal: + for knee pain   Neurological: Negative  Hematological: Negative  Psychiatric/Behavioral: Negative    Objective:   Vitals:   01/29/16 0850  BP: (!) 199/108  Pulse: 98  Resp: 16  Temp: 98.5 F (36.9 C)    Physical Exam: Constitutional: Patient appears well-developed and well-nourished. No distress. HENT: Normocephalic, atraumatic, External right and left ear normal. Oropharynx is clear and moist.  Eyes: Conjunctivae and EOM are normal. PERRLA, no scleral icterus. Neck: Normal ROM. Neck supple. No lymphadenopathy, No thyromegaly. CVS: RRR, S1/S2 +, no murmurs, no gallops, no rubs Pulmonary: Effort and breath sounds normal, no stridor, rhonchi, wheezes, rales.  Abdominal: Soft. Normoactive BS,, no distension, tenderness, rebound or guarding.  Musculoskeletal: Normal range of motion. No edema and no tenderness.  Neuro: Alert.Normal muscle tone coordination. Non-focal Skin: Skin is warm and dry. No rash noted. Not diaphoretic. No erythema. No pallor.dry patch with 2 small open area on left anterior lower leg. Psychiatric: Normal mood and affect. Behavior, judgment, thought content normal.  Lab Results  Component Value Date   WBC 5.1 08/27/2014   HGB 14.3 08/27/2014   HCT 43.6 08/27/2014   MCV 86.2 08/27/2014   PLT 157 08/27/2014   Lab Results  Component Value Date   CREATININE 0.67 08/27/2014   BUN 7 08/27/2014   NA 138 08/27/2014   K 4.3 08/27/2014   CL 102 08/27/2014   CO2 30 08/27/2014    Lab Results  Component Value Date   HGBA1C 13.1 01/29/2016   Lipid Panel     Component Value Date/Time   CHOL 201 (H) 10/22/2010 0740   TRIG 75 10/22/2010 0740   HDL 41 10/22/2010 0740   CHOLHDL 4.9 10/22/2010 0740   VLDL 15 10/22/2010 0740   LDLCALC 145 (H) 10/22/2010 0740       Assessment and plan:   1. Type 2 diabetes mellitus without complication, unspecified long term insulin use status (HCC)  - POCT glycosylated hemoglobin (Hb  A1C)  2. Type 2 diabetes mellitus with complication, without long-term current use of insulin (HCC)  - Microalbumin, urine - CBC with Differential - COMPLETE METABOLIC PANEL WITH GFR - Lipid panel  3. Need for prophylactic vaccination and inoculation against influenza  - Flu Vaccine QUAD 36+ mos PF IM (Fluarix & Fluzone Quad PF)   Return in about 2 weeks (around 02/12/2016) for for nurse visit  and 3 months with me. .  The patient was given clear instructions to go to ER or return to medical center if symptoms don't improve, worsen or new problems  develop. The patient verbalized understanding.    Micheline Chapman FNP  01/29/2016, 11:04 AM

## 2016-01-30 ENCOUNTER — Other Ambulatory Visit: Payer: Self-pay | Admitting: Family Medicine

## 2016-01-30 LAB — MICROALBUMIN, URINE: Microalb, Ur: 25.4 mg/dL

## 2016-01-31 MED ORDER — PRAVASTATIN SODIUM 40 MG PO TABS: 40.0000 mg | ORAL_TABLET | Freq: Every day | ORAL | 3 refills | Status: DC

## 2016-02-12 ENCOUNTER — Other Ambulatory Visit: Payer: Self-pay

## 2016-02-14 ENCOUNTER — Ambulatory Visit (INDEPENDENT_AMBULATORY_CARE_PROVIDER_SITE_OTHER): Payer: Self-pay | Admitting: *Deleted

## 2016-02-14 ENCOUNTER — Telehealth: Payer: Self-pay | Admitting: *Deleted

## 2016-02-14 VITALS — BP 135/85 | HR 71 | Temp 98.3°F | Resp 18 | Ht 70.0 in | Wt 251.0 lb

## 2016-02-14 DIAGNOSIS — I10 Essential (primary) hypertension: Secondary | ICD-10-CM

## 2016-02-14 NOTE — Telephone Encounter (Signed)
Medical Assistant left message on patient's home and cell voicemail. Voicemail states to give a call back to [place] with [facility] at [phone].  !!!Please inform patient of paperwork being completed. Patient may pick up. Please inform patient of PCP being willing to fill out another form in January with better numbers once patient has showed compliance and the numbers reflect him taking his medications as prescribed!!!

## 2016-02-14 NOTE — Progress Notes (Signed)
Patient is here for BP check  Patient has taken medication today, Patient has not eaten today.

## 2016-04-30 ENCOUNTER — Ambulatory Visit: Payer: Self-pay | Admitting: Family Medicine

## 2018-09-30 ENCOUNTER — Other Ambulatory Visit: Payer: Self-pay | Admitting: Family Medicine

## 2018-09-30 DIAGNOSIS — L97501 Non-pressure chronic ulcer of other part of unspecified foot limited to breakdown of skin: Secondary | ICD-10-CM

## 2018-10-26 ENCOUNTER — Inpatient Hospital Stay: Admission: RE | Admit: 2018-10-26 | Payer: Self-pay | Source: Ambulatory Visit

## 2019-05-02 ENCOUNTER — Ambulatory Visit: Payer: Self-pay | Attending: Internal Medicine

## 2019-05-02 DIAGNOSIS — Z20822 Contact with and (suspected) exposure to covid-19: Secondary | ICD-10-CM | POA: Insufficient documentation

## 2019-05-03 LAB — NOVEL CORONAVIRUS, NAA: SARS-CoV-2, NAA: NOT DETECTED

## 2019-05-10 ENCOUNTER — Encounter: Payer: Self-pay | Admitting: Internal Medicine

## 2019-05-10 ENCOUNTER — Other Ambulatory Visit: Payer: Self-pay

## 2019-05-10 ENCOUNTER — Ambulatory Visit: Payer: Self-pay | Admitting: Internal Medicine

## 2019-05-10 VITALS — BP 130/83 | HR 73 | Temp 99.1°F | Ht 70.0 in | Wt 288.9 lb

## 2019-05-10 DIAGNOSIS — I10 Essential (primary) hypertension: Secondary | ICD-10-CM

## 2019-05-10 DIAGNOSIS — Z79899 Other long term (current) drug therapy: Secondary | ICD-10-CM

## 2019-05-10 DIAGNOSIS — Z20822 Contact with and (suspected) exposure to covid-19: Secondary | ICD-10-CM

## 2019-05-10 DIAGNOSIS — R053 Chronic cough: Secondary | ICD-10-CM | POA: Insufficient documentation

## 2019-05-10 DIAGNOSIS — Z794 Long term (current) use of insulin: Secondary | ICD-10-CM

## 2019-05-10 DIAGNOSIS — E119 Type 2 diabetes mellitus without complications: Secondary | ICD-10-CM

## 2019-05-10 DIAGNOSIS — Z8619 Personal history of other infectious and parasitic diseases: Secondary | ICD-10-CM

## 2019-05-10 DIAGNOSIS — R05 Cough: Secondary | ICD-10-CM

## 2019-05-10 DIAGNOSIS — B182 Chronic viral hepatitis C: Secondary | ICD-10-CM

## 2019-05-10 DIAGNOSIS — M1710 Unilateral primary osteoarthritis, unspecified knee: Secondary | ICD-10-CM

## 2019-05-10 DIAGNOSIS — R918 Other nonspecific abnormal finding of lung field: Secondary | ICD-10-CM

## 2019-05-10 DIAGNOSIS — B1920 Unspecified viral hepatitis C without hepatic coma: Secondary | ICD-10-CM

## 2019-05-10 DIAGNOSIS — Z6841 Body Mass Index (BMI) 40.0 and over, adult: Secondary | ICD-10-CM

## 2019-05-10 DIAGNOSIS — G4733 Obstructive sleep apnea (adult) (pediatric): Secondary | ICD-10-CM

## 2019-05-10 DIAGNOSIS — E11610 Type 2 diabetes mellitus with diabetic neuropathic arthropathy: Secondary | ICD-10-CM

## 2019-05-10 DIAGNOSIS — E669 Obesity, unspecified: Secondary | ICD-10-CM

## 2019-05-10 DIAGNOSIS — M1711 Unilateral primary osteoarthritis, right knee: Secondary | ICD-10-CM

## 2019-05-10 DIAGNOSIS — Z03818 Encounter for observation for suspected exposure to other biological agents ruled out: Secondary | ICD-10-CM

## 2019-05-10 DIAGNOSIS — R911 Solitary pulmonary nodule: Secondary | ICD-10-CM

## 2019-05-10 DIAGNOSIS — Z87891 Personal history of nicotine dependence: Secondary | ICD-10-CM

## 2019-05-10 DIAGNOSIS — E785 Hyperlipidemia, unspecified: Secondary | ICD-10-CM | POA: Insufficient documentation

## 2019-05-10 LAB — POCT GLYCOSYLATED HEMOGLOBIN (HGB A1C): Hemoglobin A1C: 8.1 % — AB (ref 4.0–5.6)

## 2019-05-10 LAB — GLUCOSE, CAPILLARY: Glucose-Capillary: 216 mg/dL — ABNORMAL HIGH (ref 70–99)

## 2019-05-10 NOTE — Assessment & Plan Note (Addendum)
Hyperlipidemia: His last lipid panel in 2017 showed LDL 144, total cholesterol 240, triglycerides 241.  Given that he is a gentleman with diabetes over the age of 69, he requires a high intensity statin.  Plan: -Start high intensity statin once we obtain financial assistance. -Follow-up lipid panel -Consider starting combination Atorvastatin-Amlodipine

## 2019-05-10 NOTE — Assessment & Plan Note (Signed)
Chronic cough: He states that for about 2 months now he has been experiencing dry cough with intermittent wheezing at night.  He does have a prior history of cigarette smoking but states he quit about 10 years ago.  He started smoking in his early 13s.  He currently lives with a roommate he does smoke.  Assessment: Differential diagnosis includes GERD versus obstructive or restrictive lung disease  Plan: -He already takes Tums for acid reflux. -Will require PFTs once financially stable

## 2019-05-10 NOTE — Addendum Note (Signed)
Addended by: Jean Rosenthal on: 05/10/2019 11:52 AM   Modules accepted: Level of Service

## 2019-05-10 NOTE — Assessment & Plan Note (Signed)
Type 2 diabetes mellitus: Unfortunately, we do not have records from [facility].  However per chart review, his last known laboratory work-up was in 2017 which showed hemoglobin A1c of 13.1%.  Today, his A1c is 8.1%.  His current diabetes regimen are Metformin 1000 mg twice daily Humalog 30 units twice daily though he does tell me that he gets assistance from his church and sometimes take whatever he can get his hands on.  Plan: -Continue current regiment of Metformin 1000 mg twice daily, Humalog 30 units twice daily for now -Will have to establish care with PCP -In the future, he can be started on Invokamet or a GLP-1 agonist through the [facility] outpatient pharmacy for cheaper price

## 2019-05-10 NOTE — Assessment & Plan Note (Signed)
Abnormal lung finding: CT angiography chest performed in 2012 revealed a 2 mm subpleural nodule in the left upper lobe that has not been followed up since.  Plan: -Requires repeat CT of the chest given prior history of tobacco use

## 2019-05-10 NOTE — Patient Instructions (Signed)
Mr. Wilhelmi,   Thanks for coming in to see [place] today. I am ordering blood work on you today. I will call you with the results. Depending on the results, we will make some medication changes.   Take care! Dr. Eileen Stanford  Please call the internal medicine center clinic if you have any questions or concerns, we may be able to help and keep you from a long and expensive emergency room wait. Our clinic and after hours phone number is [phone], the best time to call is Monday through Friday 9 am to 4 pm but there is always someone available 24/7 if you have an emergency. If you need medication refills please notify your pharmacy one week in advance and they will send [place] a request.

## 2019-05-10 NOTE — Assessment & Plan Note (Addendum)
Hypertension: At goal on current regimen  BP Readings from Last 3 Encounters:  05/10/19 130/83  02/14/16 135/85  01/29/16 (!) 199/108   Plan: -Continue clonidine 0.1 mg twice daily Lipitor 10 mg daily, metoprolol 100 mg twice daily -In the future, consideration should be given to either start a calcium channel blocker, thiazide diuretic, ACE inhibitor or ARB OR combination Amlodipine-Atorvastatin  -Follow-up CMP

## 2019-05-10 NOTE — Assessment & Plan Note (Addendum)
History of chronic hepatitis C: He tells me today that he has a history of hepatitis C and only received "1 shot "all the medicine that he does not recall the name.  LFTs in 2017 showed AST 130, ALT 173.  Plan: -Follow-up hepatitis C viral load  ADDENDUM: HCV RNA: >16 million -Refer to ID -Abdominal ultrasound

## 2019-05-10 NOTE — Progress Notes (Addendum)
   CC: Establish care, follow-up hypertension, type 2 diabetes mellitus  HPI:  [name] a 54 y.o. significant for hypertension, type 2 diabetes mellitus, obesity, obstructive sleep apnea, chronic hepatitis C, Charcot foot osteoarthritis of the knee who presents to establish care.  He was following up with Dr. Cira Servant at [facility] family practice however he recently lost his insurance and establish care with [place].  Please see problem based charting for further details.  Past Medical History:  Diagnosis Date  . Anxiety    Panic Atacks  . Arthritis   . Carpal tunnel syndrome   . Constipation   . Depression   . Diabetes mellitus    DMII  . Dysrhythmia    hx tachy hr  . GERD (gastroesophageal reflux disease)    no problem in past few years.  . Hepatitis   . Hypertension   . Neuropathy associated with endocrine disorder ([place])   . Seizures ([place])    "stress related" no more since taking Klonopin 2 years- See Neurologist  [facility]  . Shortness of breath dyspnea    with exertion  . Sleep apnea    used a cpap when he was 350lb-does not need one now 260lb  . Wears contact lenses    Family history: Mother with diabetes, deceased at 26.  Brother with CVA and on hemodialysis, father had a myocardial infarction at 44, also with COPD and "enlarged heart  Social history: Used to work for [employer], quit alcohol consumption in June, prior tobacco use since early 20s.  Quit smoking cigarettes about 10 years ago  Review of Systems:  As per HPI  Physical Exam:  Vitals:   05/10/19 1018 05/10/19 1022 05/10/19 1100  BP:  (!) 156/102 130/83  Pulse:  85 73  Temp:  99.1 F (37.3 C)   TempSrc:  Oral   SpO2:  100% 100%  Weight: 288 lb 14.4 oz (131 kg)    Height: 5\' 10"  (1.778 m)     Physical Exam  Constitutional: He is well-developed, well-nourished, and in no distress. No distress.  HENT:  Head: Normocephalic and atraumatic.  Eyes: Conjunctivae are normal.  Cardiovascular:  Normal rate, regular rhythm and normal heart sounds. Exam reveals no friction rub.  No murmur heard. Pulmonary/Chest: Effort normal and breath sounds normal. No respiratory distress. He has no wheezes.  Abdominal: Soft. Bowel sounds are normal. Distension: Due to body habitus.  Musculoskeletal:        General: No edema. Deformity: Mild Charcot deformity on the right foot.  Neurological: He is alert. Gait normal.  Skin: Skin is warm. He is not diaphoretic.  Psychiatric: Mood, memory, affect and judgment normal.    Assessment & Plan:   See Encounters Tab for problem based charting.  Patient discussed with Dr. Lynnae January

## 2019-05-11 LAB — CMP14 + ANION GAP
ALT: 63 IU/L — ABNORMAL HIGH (ref 0–44)
AST: 54 IU/L — ABNORMAL HIGH (ref 0–40)
Albumin/Globulin Ratio: 0.9 — ABNORMAL LOW (ref 1.2–2.2)
Albumin: 3.3 g/dL — ABNORMAL LOW (ref 3.8–4.9)
Alkaline Phosphatase: 86 IU/L (ref 39–117)
Anion Gap: 14 mmol/L (ref 10.0–18.0)
BUN/Creatinine Ratio: 22 — ABNORMAL HIGH (ref 9–20)
BUN: 24 mg/dL (ref 6–24)
Bilirubin Total: 0.2 mg/dL (ref 0.0–1.2)
CO2: 22 mmol/L (ref 20–29)
Calcium: 9.1 mg/dL (ref 8.7–10.2)
Chloride: 103 mmol/L (ref 96–106)
Creatinine, Ser: 1.1 mg/dL (ref 0.76–1.27)
GFR calc Af Amer: 89 mL/min/{1.73_m2} (ref >59)
GFR calc non Af Amer: 77 mL/min/{1.73_m2} (ref >59)
Globulin, Total: 3.8 g/dL (ref 1.5–4.5)
Glucose: 220 mg/dL — ABNORMAL HIGH (ref 65–99)
Potassium: 5.1 mmol/L (ref 3.5–5.2)
Sodium: 139 mmol/L (ref 134–144)
Total Protein: 7.1 g/dL (ref 6.0–8.5)

## 2019-05-11 LAB — CBC WITH DIFFERENTIAL/PLATELET
Basophils Absolute: 0.1 10*3/uL (ref 0.0–0.2)
Basos: 1 %
EOS (ABSOLUTE): 0.3 10*3/uL (ref 0.0–0.4)
Eos: 5 %
Hematocrit: 41.4 % (ref 37.5–51.0)
Hemoglobin: 13.9 g/dL (ref 13.0–17.7)
Immature Grans (Abs): 0 10*3/uL (ref 0.0–0.1)
Immature Granulocytes: 0 %
Lymphocytes Absolute: 2.6 10*3/uL (ref 0.7–3.1)
Lymphs: 40 %
MCH: 29 pg (ref 26.6–33.0)
MCHC: 33.6 g/dL (ref 31.5–35.7)
MCV: 86 fL (ref 79–97)
Monocytes Absolute: 0.4 10*3/uL (ref 0.1–0.9)
Monocytes: 6 %
Neutrophils Absolute: 3 10*3/uL (ref 1.4–7.0)
Neutrophils: 48 %
Platelets: 173 10*3/uL (ref 150–450)
RBC: 4.8 x10E6/uL (ref 4.14–5.80)
RDW: 12.7 % (ref 11.6–15.4)
WBC: 6.4 10*3/uL (ref 3.4–10.8)

## 2019-05-11 LAB — LIPID PANEL
Chol/HDL Ratio: 5.2 ratio — ABNORMAL HIGH (ref 0.0–5.0)
Cholesterol, Total: 290 mg/dL — ABNORMAL HIGH (ref 100–199)
HDL: 56 mg/dL (ref >39)
LDL Chol Calc (NIH): 200 mg/dL — ABNORMAL HIGH (ref 0–99)
Triglycerides: 182 mg/dL — ABNORMAL HIGH (ref 0–149)
VLDL Cholesterol Cal: 34 mg/dL (ref 5–40)

## 2019-05-11 NOTE — Progress Notes (Signed)
Internal Medicine Clinic Attending  Case discussed with Dr. Agyei at the time of the visit.  We reviewed the resident's history and exam and pertinent patient test results.  I agree with the assessment, diagnosis, and plan of care documented in the resident's note.    

## 2019-05-12 ENCOUNTER — Encounter (INDEPENDENT_AMBULATORY_CARE_PROVIDER_SITE_OTHER): Payer: Self-pay

## 2019-05-12 LAB — HCV RNA QUANT

## 2019-05-12 LAB — SPECIMEN STATUS REPORT

## 2019-05-12 LAB — HCV RNA (INTERNATIONAL UNITS)
HCV RNA (International Units): 16500000 IU/mL
HCV log10: 7.217 log10 IU/mL

## 2019-05-13 ENCOUNTER — Telehealth: Payer: Self-pay | Admitting: *Deleted

## 2019-05-13 ENCOUNTER — Encounter (INDEPENDENT_AMBULATORY_CARE_PROVIDER_SITE_OTHER): Payer: Self-pay

## 2019-05-13 NOTE — Telephone Encounter (Signed)
Pt filled out Covid questionnaire and had a bpa fire for increased weakness. Spoke with pt and he said that he really did not mean more weak, really meant more tired. Required more sleep today than before. Reassured that tiredness is a common symptom and he should follow what his body needs and rest. Pt encouraged to call for medical assistance if his symptoms increase to that point. Verbalized understanding.

## 2019-05-14 ENCOUNTER — Encounter (INDEPENDENT_AMBULATORY_CARE_PROVIDER_SITE_OTHER): Payer: Self-pay

## 2019-05-15 ENCOUNTER — Ambulatory Visit (INDEPENDENT_AMBULATORY_CARE_PROVIDER_SITE_OTHER): Payer: Self-pay | Admitting: Internal Medicine

## 2019-05-15 ENCOUNTER — Other Ambulatory Visit: Payer: Self-pay

## 2019-05-15 ENCOUNTER — Encounter (INDEPENDENT_AMBULATORY_CARE_PROVIDER_SITE_OTHER): Payer: Self-pay

## 2019-05-15 ENCOUNTER — Encounter: Payer: Self-pay | Admitting: Internal Medicine

## 2019-05-15 DIAGNOSIS — Z598 Other problems related to housing and economic circumstances: Secondary | ICD-10-CM | POA: Insufficient documentation

## 2019-05-15 DIAGNOSIS — E785 Hyperlipidemia, unspecified: Secondary | ICD-10-CM

## 2019-05-15 DIAGNOSIS — B182 Chronic viral hepatitis C: Secondary | ICD-10-CM

## 2019-05-15 DIAGNOSIS — Z79899 Other long term (current) drug therapy: Secondary | ICD-10-CM

## 2019-05-15 DIAGNOSIS — Z5989 Other problems related to housing and economic circumstances: Secondary | ICD-10-CM | POA: Insufficient documentation

## 2019-05-15 MED ORDER — ROSUVASTATIN CALCIUM 40 MG PO TABS: 40.0000 mg | ORAL_TABLET | Freq: Every day | ORAL | 3 refills | Status: DC

## 2019-05-15 NOTE — Progress Notes (Signed)
Letter sent.

## 2019-05-15 NOTE — Progress Notes (Addendum)
  [facility] Telephone Encounter Continuity Care Appointment  HPI:   This telephone encounter was created for [name] on 05/15/2019 for the following purpose/cc Follow up Hyperlipidemia, Chronic Hep C.  [name] a 54 year old very pleasant African gentleman who I had the pleasure of speaking to on the phone today to follow-up on recent clinic visit after he established care with [place].  He states that he [name] doing well today and denies any symptoms whatsoever.   Please see problem based charting for further details.    Past Medical History:  Past Medical History:  Diagnosis Date  . Anxiety    Panic Atacks  . Arthritis   . Carpal tunnel syndrome   . Constipation   . Depression   . Diabetes mellitus    DMII  . Dysrhythmia    hx tachy hr  . GERD (gastroesophageal reflux disease)    no problem in past few years.  . Hepatitis   . Hypertension   . Neuropathy associated with endocrine disorder ([place])   . Seizures ([place])    "stress related" no more since taking Klonopin 2 years- See Neurologist  [facility]  . Shortness of breath dyspnea    with exertion  . Sleep apnea    used a cpap when he was 350lb-does not need one now 260lb  . Wears contact lenses       ROS:   AS per HPI   Assessment / Plan / Recommendations:   Please see A&P under problem oriented charting for assessment of the patient's acute and chronic medical conditions.   As always, pt [name] advised that if symptoms worsen or new symptoms arise, they should go to an urgent care facility or to to ER for further evaluation.   Consent and Medical Decision Making:   Patient discussed with Dr. [name]  This [name] a telephone encounter between [place] on 05/15/2019 for following up on hyperlipidemia, lab results. The visit was conducted with the patient located at home and Jean Rosenthal at [facility]. The patient's identity was confirmed using their DOB and current  address. The patient has consented to being evaluated through a telephone encounter and understands the associated risks (an examination cannot be done and the patient may need to come in for an appointment) / benefits (allows the patient to remain at home, decreasing exposure to coronavirus). I personally spent 3 minutes on medical discussion.

## 2019-05-15 NOTE — Assessment & Plan Note (Signed)
As indicated in my addendum plan his last visit, he was found to have hepatitis C virus RNA level greater than 16 million.  I made him aware that I have placed a referral for him to see infectious disease as well as an abdominal ultrasound.  He is currently uninsured, he will need financial assistance for continue medical management of his chronic diseases.

## 2019-05-15 NOTE — Assessment & Plan Note (Signed)
ASCVD of 12.6%. Will switch pravastatin to rosuvastatin 40 mg daily.

## 2019-05-15 NOTE — Progress Notes (Signed)
Patient Name: [name]  Date of Birth: 04-24-1965  MRN: [id number]  PCP: Patient, No Pcp Per  Referring Provider: Bartholomew Crews, MD, Ph#: [phone]   Patient Active Problem List   Diagnosis Date Noted  . Underinsured 05/15/2019  . Hyperlipidemia 05/10/2019  . Chronic hepatitis C ([place]) 05/10/2019  . Chronic cough 05/10/2019  . Abnormal CT scan of lung 05/10/2019  . Diabetes ([place]) 01/29/2016  . Essential hypertension 01/29/2016  . Lumbar facet arthropathy 08/29/2014    CC:  New patient - initial evaluation and management of chronic hepatitis C infection.    HPI/ROS:  [name] is a 54 y.o. male is a 54 y.o.   He first learned he had Hepatitis C in 2005 and was working with liver team locally at that time. He underwent liver biopsy which revealed stage 2 disease; this was done about 10 years ago. He has never had treatment before as he was not far along enough in fibrosis to be considered for treatment previously.   He is now uninsured and will need some assistance obtaining medications and financial assistance for care. He used to drink alcohol, where a 12 pack would last him about 3-4 days or so. He quit altogether over a year ago now. He has a pmhx including diabetes, insensate neuropathy, hyperlipidemia, hypertension and recently with diagnosis of Charcot arthropathy of the right foot. Previously had an ulcer on the right plantar surface however this has since healed. He was previously working as a Engineer, drilling.   Risk factors for Hepatitis C infection are unclear. No history of IVDU, intranasal drug use, sexual encounters with multiple partners/known hepatic patient, no dialysis or unregulated tattoos. No ongoing risk behaviors identified today.    Constitutional: negative for fevers, fatigue and anorexia Cardiovascular: positive for lower extremity edema, negative for chest pain, orthopnea Gastrointestinal: negative for nausea, diarrhea, abdominal pain  and jaundice Genitourinary: negative for hematuria Musculoskeletal: negative for stiff joints and muscle weakness Neurological: positive for paresthesia/neuropathy, negative for headaches, dizziness, coordination problems, gait problems and tremor Behavioral/Psych: negative for excessive alcohol consumption and illegal drug usage All other systems reviewed and are negative      Past Medical History:  Diagnosis Date  . Anxiety    Panic Atacks  . Arthritis   . Carpal tunnel syndrome   . Constipation   . Depression   . Diabetes mellitus    DMII  . Dysrhythmia    hx tachy hr  . GERD (gastroesophageal reflux disease)    no problem in past few years.  . Hepatitis   . Hypertension   . Neuropathy associated with endocrine disorder ([place])   . Seizures ([place])    "stress related" no more since taking Klonopin 2 years- See Neurologist  [facility]  . Shortness of breath dyspnea    with exertion  . Sleep apnea    used a cpap when he was 350lb-does not need one now 260lb  . Wears contact lenses     Prior to Admission medications   Medication Sig Start Date End Date Taking? Authorizing Provider  amLODipine (NORVASC) 10 MG tablet Take 10 mg by mouth daily.    [provider]  cloNIDine (CATAPRES) 0.1 MG tablet Take 0.1 mg by mouth 2 (two) times daily.    [provider]  cyclobenzaprine (FLEXERIL) 10 MG tablet Take 10 mg by mouth 3 (three) times daily as needed.    [provider]  diclofenac (VOLTAREN) 50 MG EC  tablet Take 50 mg by mouth 2 (two) times daily.    [provider]  gabapentin (NEURONTIN) 300 MG capsule Take 300 mg by mouth 3 (three) times daily.    [provider]  insulin lispro (HUMALOG) 100 UNIT/ML injection Inject 30 Units into the skin 2 (two) times daily.    [provider]  metFORMIN (GLUCOPHAGE) 1000 MG tablet Take 1 tablet (1,000 mg total) by mouth 2 (two) times daily with a meal. 01/29/16   Micheline Chapman, NP    metoprolol tartrate (LOPRESSOR) 100 MG tablet Take 100 mg by mouth 2 (two) times daily.    [provider]  rosuvastatin (CRESTOR) 40 MG tablet Take 1 tablet (40 mg total) by mouth daily. 05/15/19   Jean Rosenthal, MD    No Known Allergies  Social History   Tobacco Use  . Smoking status: Former Smoker    Quit date: 10/27/2008    Years since quitting: 10.5  . Smokeless tobacco: Never Used  Substance Use Topics  . Alcohol use: Not Currently    Alcohol/week: 2.0 standard drinks    Types: 2 Shots of liquor per week    Comment: occ2-3 x week  . Drug use: No    Types: Marijuana    Comment: Last time 08/14/14-     Family History  Problem Relation Age of Onset  . Cancer Mother        pancres  . Diabetes Mother   . Hypertension Brother   . Renal cancer Brother   . Cancer Maternal Uncle       Objective:   Vitals:   05/16/19 0918  BP: (!) 170/103  Pulse: 72  Temp: 98.4 F (36.9 C)  SpO2: 100%   Constitutional: well developed and well nourished and oriented times 3 Eyes: anicteric Cardiovascular: Cor RRR and No murmurs Respiratory: clear Gastrointestinal: Bowel sounds are normal, liver is not enlarged, spleen is not enlarged Musculoskeletal: peripheral pulses normal, no pedal edema, no clubbing or cyanosis, no pedal edema noted Skin: negative for - jaundice, spider hemangioma, telangiectasia, palmar erythema, ecchymosis and atrophy; no porphyria cutanea tarda Lymphatic: no cervical lymphadenopathy   Laboratory: Genotype: No results found for: HCVGENOTYPE HCV viral load: No results found for: HCVQUANT Lab Results  Component Value Date   WBC 6.4 05/10/2019   HGB 13.9 05/10/2019   HCT 41.4 05/10/2019   MCV 86 05/10/2019   PLT 173 05/10/2019    Lab Results  Component Value Date   CREATININE 1.10 05/10/2019   BUN 24 05/10/2019   NA 139 05/10/2019   K 5.1 05/10/2019   CL 103 05/10/2019   CO2 22 05/10/2019    Lab Results  Component Value Date   ALT 63  (H) 05/10/2019   AST 54 (H) 05/10/2019   ALKPHOS 86 05/10/2019    Lab Results  Component Value Date   BILITOT 0.2 05/10/2019   ALBUMIN 3.3 (L) 05/10/2019    Imaging:  None on file     Assessment & Plan:   Problem List Items Addressed This Visit      Unprioritized   Underinsured    Information given today regarding [place] application; completed [facility] paperwork today and patient assistance for medication acquisition.       Hyperlipidemia    Unable to afford rosuvastatin ($233). This is primary prevention in diabetic patient with last LDL 200. There is a drug interaction with both Mavyret and Epclusa - will message his PCP to inform  of financial trouble and make recommendation to lower dose to 10 mg QD vs hold off x 8 weeks while he undergoes tx for hep C.   Lab Results  Component Value Date   LDLCALC 200 (H) 05/10/2019         Chronic hepatitis C ([place]) - Primary    New Patient with Chronic Hepatitis C genotype unknown, treatment naive. Recently hep C RNA > 16 million copies. 10 years ago it sounds like his liver biopsy was scored at [facility]. He has in the past had more elevated LFTs with thrombocytopenia in the setting of excessive alcohol use; LFTs now only mildly elevated and platelets normal. Will check PT/INR as well as Fibrotest. He should get an abdominal ultrasound with elastography to get updated fibrosis risk given his long standing history of infection.   I discussed with the patient the lab findings that confirm chronic hepatitis C as well as the natural history and progression of disease including about 30% of people who develop cirrhosis of the liver if left untreated and once cirrhosis is established there is a 2-7% risk per year of liver cancer and liver failure.  I discussed the importance of treatment and benefits in reducing the risk, even if significant liver fibrosis exists. I also discussed risk for re-infection following treatment should  he not continue to modify risk factors.    Patient counseled extensively on limiting acetaminophen to no more than 2 grams daily, avoidance of alcohol.  Transmission discussed with patient including sexual transmission, sharing razors and toothbrush.   Will need referral to gastroenterology if concern for cirrhosis  Will prescribe appropriate medication based on genotype and coverage   Hepatitis A and B titers to be drawn today with appropriate vaccinations as needed   Pneumovax vaccine has already been received.   Further work up to include liver staging through non-invasive serum analysis; Liver Fibrosis panel; elastography to be scheduled.   Will call [name] back once all results are in and counsel on medication over the phone. He will return 4 weeks after starting to meet with pharmacy team and check RNA at that time.        Relevant Orders   Hepatitis C genotype   Protime-INR   Liver Fibrosis, FibroTest-ActiTest   US ABDOMEN COMPLETE W/ELASTOGRAPHY   Hepatitis B surface antibody,qualitative      I spent 45 minutes with the patient including greater than 70% of time in face to face counsel of the patient re hepatitis c and the details described above and in coordination of their care.  Janene Madeira, MSN, NP-C [facility] [email or web address] Pager: [phone] Office: [place]: [phone]

## 2019-05-15 NOTE — Addendum Note (Signed)
Addended by: Jean Rosenthal on: 05/15/2019 11:29 AM   Modules accepted: Orders

## 2019-05-16 ENCOUNTER — Other Ambulatory Visit: Payer: Self-pay

## 2019-05-16 ENCOUNTER — Encounter (INDEPENDENT_AMBULATORY_CARE_PROVIDER_SITE_OTHER): Payer: Self-pay

## 2019-05-16 ENCOUNTER — Ambulatory Visit (INDEPENDENT_AMBULATORY_CARE_PROVIDER_SITE_OTHER): Payer: Self-pay | Admitting: Infectious Diseases

## 2019-05-16 ENCOUNTER — Encounter: Payer: Self-pay | Admitting: Infectious Diseases

## 2019-05-16 ENCOUNTER — Telehealth: Payer: Self-pay | Admitting: Pharmacy Technician

## 2019-05-16 VITALS — BP 170/103 | HR 72 | Temp 98.4°F | Ht 70.0 in | Wt 290.0 lb

## 2019-05-16 DIAGNOSIS — B182 Chronic viral hepatitis C: Secondary | ICD-10-CM

## 2019-05-16 DIAGNOSIS — Z598 Other problems related to housing and economic circumstances: Secondary | ICD-10-CM

## 2019-05-16 DIAGNOSIS — Z5989 Other problems related to housing and economic circumstances: Secondary | ICD-10-CM

## 2019-05-16 DIAGNOSIS — E785 Hyperlipidemia, unspecified: Secondary | ICD-10-CM

## 2019-05-16 NOTE — Assessment & Plan Note (Signed)
Information given today regarding [employer] application; completed [facility] paperwork today and patient assistance for medication acquisition.

## 2019-05-16 NOTE — Telephone Encounter (Signed)
[facility] Patient Advocate Encounter    Findings of the benefits investigation conducted this morning via test claims for the patient's upcoming appointment on 02/09 are as follows:   Insurance: uninsured  [facility] Patient Advocate will follow up once patient arrives for their appointment and verify that there is no insurance coverage. Applications will be filled out for manufacturer assistance.  Bartholomew Crews, CPhT Specialty Pharmacy Patient [facility] Phone: [phone] Fax: [phone] 05/16/2019 9:00 AM

## 2019-05-16 NOTE — Assessment & Plan Note (Signed)
New Patient with Chronic Hepatitis C genotype unknown, treatment naive. Recently hep C RNA > 16 million copies. 10 years ago it sounds like his liver biopsy was scored at [facility] He has in the past had more elevated LFTs with thrombocytopenia in the setting of excessive alcohol use; LFTs now only mildly elevated and platelets normal. Will check PT/INR as well as Fibrotest. He should get an abdominal ultrasound with elastography to get updated fibrosis risk given his long standing history of infection.   I discussed with the patient the lab findings that confirm chronic hepatitis C as well as the natural history and progression of disease including about 30% of people who develop cirrhosis of the liver if left untreated and once cirrhosis is established there is a 2-7% risk per year of liver cancer and liver failure.  I discussed the importance of treatment and benefits in reducing the risk, even if significant liver fibrosis exists. I also discussed risk for re-infection following treatment should he not continue to modify risk factors.    Patient counseled extensively on limiting acetaminophen to no more than 2 grams daily, avoidance of alcohol.  Transmission discussed with patient including sexual transmission, sharing razors and toothbrush.   Will need referral to gastroenterology if concern for cirrhosis  Will prescribe appropriate medication based on genotype and coverage   Hepatitis A and B titers to be drawn today with appropriate vaccinations as needed   Pneumovax vaccine has already been received.   Further work up to include liver staging through non-invasive serum analysis; Liver Fibrosis panel; elastography to be scheduled.   Will call Cherie Khera Porcelli back once all results are in and counsel on medication over the phone. He will return 4 weeks after starting to meet with pharmacy team and check RNA at that time.

## 2019-05-16 NOTE — Assessment & Plan Note (Addendum)
Unable to afford rosuvastatin ($233). This is primary prevention in diabetic patient with last LDL 200. There is a drug interaction with both Mavyret and Epclusa - will message his PCP to inform of financial trouble and make recommendation to lower dose to 10 mg QD vs hold off x 8 weeks while he undergoes tx for hep C.   Lab Results  Component Value Date   LDLCALC 200 (H) 05/10/2019

## 2019-05-16 NOTE — Patient Instructions (Signed)
Nice to meet you today!    We need to get a little more information about your hepatitis c infection before we start your treatment. I anticipate that we can get you started in a few weeks after we submit approval to your insurance to ensure payment. We may need to place referral for an ultrasound and/or gastroenterology if your blood work indicates more damage to the liver than expected.     ABOUT HEPATITIS C VIRUS:   Chronic Hepatitis C is the most common blood-borne infection in the [place], affecting approximately 3 million people.   It is the leading cause of cirrhosis, liver cancer, and end stage liver disease requiring transplantation when this infection goes untreated for many years   The majority of people who are infected are unaware because there are not many early symptoms that are specific to this and often go undiagnosed until a specific blood test is drawn.    The hepatitis c virus is passed primarily through direct exposure of contaminated blood or body fluids. It is most efficiently transmitted through repeated exposure to infected blood.   Risk for sexual transmission is very low but is possible if there is high frequency of unprotected sexual activity with known hepatitis c partner or multiple partners of known status.   Over time, approximately 60-70% of people can develop some degree of liver disease. Cirrhosis occurs in 10-20% of those with chronic infection. 1-5% will get liver cancer, which has a very high rate of death.    Approximately 15-25% clear the infection without medication (usually in the first 6 months of becoming exposed to virus)   Newer medications provide over 95% cure rate when taken as prescribed    IN GENERAL ABOUT DIET  . Persons living with chronic hepatitis c infection should eat a diet to maintain a healthy weight and avoid nutritional deficiencies.   . Completely avoiding alcohol is the best decision for your liver health. If  unable to do so please limit alcohol to as little as possible to less than 1 standard drink a day - this is very irritating to your liver.  . Limit tylenol use to less than 2,000 mg daily (two extra strength tablets only twice a day)  . If you have cirrhosis of the liver please take no more than 1,000 mg tylenol a day  . Patients with cirrhosis should not have protein restriction; we recommend a protein intake of approximately 1.2-1.5 g/kg/day.   . For patients with cirrhosis and hepatic encephalopathy, the American Association for the Study of Liver Diseases (AASLD) recommended protein intake is 1.2-1.5 g/kg/day.  . If you experience ascites (fluid accumulation in the abdomen associated with severe liver damage / cirrhosis) please limit sodium intake to < 2000 mg a day    UNTIL YOU HAVE BEEN TREATED AND CURED:  . Use condoms with all sexual encounters or practice abstinence to avoid sexual transmission   . No sharing of razors, toothbrushes, nail clippers or anything that could potentially have blood on it.   . If you cut yourself please clean and cover any wounds or open sores to others do not come into contact with your blood.   . If blood spills onto item/surface please clean with 1:10 bleach solution and allow to dry, EVEN if it is dried blood.    GENERAL HELPFUL HINTS ON HCV THERAPY:  1. Stay well-hydrated.  2. Notify the [facility] of any changes in your other over-the-counter/herbal or prescription medications.    3. If you miss a dose of your medication, take the missed dose as soon as you remember. Return to your regular time/dose schedule the next day.   4.  Do not stop taking your medications without first talking with your healthcare provider.  5.  You will see our pharmacist-specialist within the first 2 weeks of starting your medication to monitor for any possible side effects.  6.  You will have blood work once during treatment 4 weeks after your first pill. Again  soon after treatment is completed and one final lab 3 months after your last pill to ensure cure!   TIPS TO BE SUCCESSFUL WITH DAILY MEDICATION USE:  1. Set a reminder on your phone  2. Try filling out a pill box for the week - pick a day and put one pill for every day during the week so you know right away if you missed a pill.   3. Have a trusted family member ask you about your medications.   4. Smartphone app    Medication we would like to use for you will be : Mavyret   Mavyret Instructions:  1. Take Mavyret, three tablets (at the same time) daily with food. Please take ALL THREE PILLS AT ONCE. You should take it at approximately the same time every day. Treatment will be for 8 weeks. Do not miss a dose.    2. Do not run out of [place]! If you are down to one week of medication left and have not heard about your next shipment, please let us know as soon as possible. You will be given 28 days of treatment at a time and will receive one refill.   3. If you need to start a new medication, prescription from your doctor or over the counter medication, you need to contact us to make sure it does not interfere with [place]. There are several medications that can interfere with Mavyret and can make you sick or make the medication not work.  4. If you need to take a medication for acid reflux, you can take omeprazole 20mg  daily.     5. Tylenol (acetominophen) and Advil (ibuprofen) are safe to take with [place] if needed for headache, fever, pain.   6. IF YOU ARE ON BIRTH CONTROL PILLS, YOU RECEIVE A SHOT FOR BIRTH CONTROL, OR YOU HAVE AN IUD, please notify your provider to make sure it is safe with Mavyret.   7. DO NOT stop Mavyret unless instructed to by your provider. If you are hospitalized while taking this medication please bring it with you to the hospital to avoid interruption of therapy. Every pill is important!  8. The most common side effects associated with Mavyret include:   o Fatigue o Headache o Nausea o Diarrhea o Insomnia

## 2019-05-17 ENCOUNTER — Encounter (INDEPENDENT_AMBULATORY_CARE_PROVIDER_SITE_OTHER): Payer: Self-pay

## 2019-05-17 ENCOUNTER — Other Ambulatory Visit: Payer: Self-pay | Admitting: Internal Medicine

## 2019-05-17 DIAGNOSIS — E785 Hyperlipidemia, unspecified: Secondary | ICD-10-CM

## 2019-05-17 MED ORDER — ROSUVASTATIN CALCIUM 10 MG PO TABS: 10.0000 mg | ORAL_TABLET | Freq: Every day | ORAL | 3 refills | Status: DC

## 2019-05-17 MED FILL — ROSUVASTATIN CALCIUM 10 MG: 10 | 30 days supply | Qty: 30 | Fill #0

## 2019-05-17 NOTE — Progress Notes (Signed)
Internal Medicine Clinic Attending  Case discussed with Dr. Agyei at the time of the visit.  We reviewed the resident's history and exam and pertinent patient test results.  I agree with the assessment, diagnosis, and plan of care documented in the resident's note.  Alexander Raines, M.D., Ph.D.  

## 2019-05-18 ENCOUNTER — Other Ambulatory Visit: Payer: Self-pay

## 2019-05-18 ENCOUNTER — Encounter (INDEPENDENT_AMBULATORY_CARE_PROVIDER_SITE_OTHER): Payer: Self-pay

## 2019-05-18 ENCOUNTER — Ambulatory Visit (HOSPITAL_COMMUNITY)
Admission: RE | Admit: 2019-05-18 | Discharge: 2019-05-18 | Disposition: A | Discharge status: Home / Self Care | Payer: Self-pay | Source: Ambulatory Visit | Attending: Infectious Diseases | Admitting: Infectious Diseases

## 2019-05-18 DIAGNOSIS — B182 Chronic viral hepatitis C: Secondary | ICD-10-CM | POA: Insufficient documentation

## 2019-05-19 ENCOUNTER — Encounter (INDEPENDENT_AMBULATORY_CARE_PROVIDER_SITE_OTHER): Payer: Self-pay

## 2019-05-20 LAB — LIVER FIBROSIS, FIBROTEST-ACTITEST
ALT: 57 U/L — ABNORMAL HIGH (ref 9–46)
Alpha-2-Macroglobulin: 600 mg/dL — ABNORMAL HIGH (ref 106–279)
Apolipoprotein A1: 179 mg/dL — ABNORMAL HIGH (ref 94–176)
Bilirubin: 0.2 mg/dL (ref 0.2–1.2)
GGT: 75 U/L (ref 3–95)
Haptoglobin: 27 mg/dL — ABNORMAL LOW (ref 43–212)

## 2019-05-20 LAB — PROTIME-INR
INR: 1
Prothrombin Time: 10.8 s (ref 9.0–11.5)

## 2019-05-20 LAB — HEPATITIS C GENOTYPE

## 2019-05-20 LAB — HEPATITIS B SURFACE ANTIBODY,QUALITATIVE: Hep B S Ab: NONREACTIVE

## 2019-05-21 ENCOUNTER — Encounter (INDEPENDENT_AMBULATORY_CARE_PROVIDER_SITE_OTHER): Payer: Self-pay

## 2019-05-22 ENCOUNTER — Encounter: Payer: Self-pay | Admitting: Gastroenterology

## 2019-05-22 ENCOUNTER — Other Ambulatory Visit: Payer: Self-pay | Admitting: Infectious Diseases

## 2019-05-22 ENCOUNTER — Encounter (INDEPENDENT_AMBULATORY_CARE_PROVIDER_SITE_OTHER): Payer: Self-pay

## 2019-05-22 DIAGNOSIS — B182 Chronic viral hepatitis C: Secondary | ICD-10-CM

## 2019-05-22 DIAGNOSIS — K7400 Hepatic fibrosis, unspecified: Secondary | ICD-10-CM

## 2019-05-22 NOTE — Progress Notes (Signed)
Fibrotest unable to correctly identify liver staging with significantly elevated alpha-2 macroglobulin. Fibroscan indicates advanced but compensated liver disease concerning for possible cirrhosis. FIB4 is 2.01, which is indeterminate.  Will go forward with 12 weeks Epclusa for treatment of compensated advanced liver disease genotype 1a.  Will refer to GI for evaluation for consideration of pre-treatment cirrhosis.

## 2019-05-23 ENCOUNTER — Encounter (INDEPENDENT_AMBULATORY_CARE_PROVIDER_SITE_OTHER): Payer: Self-pay

## 2019-05-23 ENCOUNTER — Other Ambulatory Visit: Payer: Self-pay | Admitting: Pharmacist

## 2019-05-23 ENCOUNTER — Telehealth: Payer: Self-pay | Admitting: Pharmacy Technician

## 2019-05-23 DIAGNOSIS — B182 Chronic viral hepatitis C: Secondary | ICD-10-CM

## 2019-05-23 MED ORDER — SOFOSBUVIR-VELPATASVIR 400-100 MG PO TABS: 1.0000 | ORAL_TABLET | Freq: Every day | ORAL | 2 refills | Status: DC

## 2019-05-23 NOTE — Telephone Encounter (Addendum)
[facility] Patient Advocate Encounter  Completed and sent Support Path application for Epclusa for this patient who is uninsured.    Patient is approved 05/23/2019 through 08/15/2019.  [facility] pharmacy will schedule delivery to patient's home.  [name]. Nadara Mustard [place] Patient [facility] Phone: [phone] Fax:  [phone]

## 2019-05-23 NOTE — Progress Notes (Signed)
Dietitian Rx for patient assistance.

## 2019-05-26 MED FILL — ROSUVASTATIN CALCIUM 10 MG: 10 | 30 days supply | Qty: 30 | Fill #0

## 2019-06-07 ENCOUNTER — Encounter: Payer: Self-pay | Admitting: Pharmacy Technician

## 2019-06-07 ENCOUNTER — Telehealth: Payer: Self-pay | Admitting: Pharmacist

## 2019-06-07 NOTE — Telephone Encounter (Signed)
Spoke to patient for [facility] counseling. He started last Friday on 2/26.  He takes it at night every day around 7pm.  He had a headache the other day after taking it but it was mild and went away without medication.  I encouraged him to take it every day without missing doses. Also encouraged him to stay away from Tums or anything for heartburn but if he needs to take something, to be sure and separate it from the [place].  Answered all questions. I will f/u with him in the pharmacy clinic on 3/31.

## 2019-06-14 ENCOUNTER — Ambulatory Visit (INDEPENDENT_AMBULATORY_CARE_PROVIDER_SITE_OTHER): Payer: Self-pay | Admitting: Gastroenterology

## 2019-06-14 ENCOUNTER — Encounter: Payer: Self-pay | Admitting: Gastroenterology

## 2019-06-14 VITALS — BP 184/102 | HR 64 | Temp 98.0°F | Ht 70.0 in | Wt 284.2 lb

## 2019-06-14 DIAGNOSIS — Z1211 Encounter for screening for malignant neoplasm of colon: Secondary | ICD-10-CM

## 2019-06-14 DIAGNOSIS — Z01818 Encounter for other preprocedural examination: Secondary | ICD-10-CM

## 2019-06-14 MED ORDER — SUPREP BOWEL PREP KIT 17.5-3.13-1.6 GM/177ML PO SOLN: 1.0000 | ORAL | 0 refills | Status: DC

## 2019-06-14 NOTE — Progress Notes (Signed)
HPI: This is a very pleasant 54 year old man who was referred by the [facility] for chronic hepatitis C, possible underlying cirrhosis  He was diagnosed with hepatitis C 15 to 20 years ago.  He is not sure how he caught it but he did have a tattoo in 2001 from a place that he is not so sure how to clean it was.  He was being worked up for this and considering interferon based therapy 15 years ago.  This work-up included a liver biopsy.  See those results summarized below.  He never started the therapy.  He recently established care with [facility] for his chronic hepatitis C and started Epclusa 2 weeks ago.  He is planning a 12-week course.  During [facility] work-up he had ultrasound with elastography that was suggestive but not conclusive for underlying fibrosis, cirrhosis  Used to drink a fair amount.  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  He stopped drinking completely about a year ago when he had a poorly healing ulcer on his foot.  His weight is up 40 pounds in the past year.  He has poorly controlled diabetes with a recent hemoglobin A1c of 8.1  Colon cancer does not run in his family.  His mother died of pancreatic cancer 10 years ago  Old Data Reviewed:  He underwent an abdominal ultrasound with elastography as work-up for his chronic hep C.  His liver was described as "coarsened increased hepatic echogenicity" however no obvious nodularity or masses were found.  Blood flow was normal through the liver.  Elastography median kPa was 10.  The radiology report states that at this level was  "suggestive of chronic liver disease, but needs further testing"  Blood work 2021 hepatitis C genotype 1a positive, INR 1.0, blood glucose 220, total bilirubin 0.2, AST 54, ALT 63.  Platelets 173.   I was able to see a liver biopsy report from December 2004 that reads "the findings are consistent with chronic moderately  active hepatitis C, inflammatory grade 3.  The majority of the biopsy showed fibrosis stage consistent with stage II with a rare focus of bridging fibrosis consistent with focal early stage III"   Review of systems: Pertinent positive and negative review of systems were noted in the above HPI section. All other review negative.   Past Medical History:  Diagnosis Date  . Anxiety    Panic Atacks  . Arthritis   . Carpal tunnel syndrome   . Constipation   . Depression   . Diabetes mellitus    DMII  . Dysrhythmia    hx tachy hr  . GERD (gastroesophageal reflux disease)    no problem in past few years.  . Hepatitis   . Hypertension   . Neuropathy associated with endocrine disorder ([place])   . Seizures ([place])    "stress related" no more since taking Klonopin 2 years- See Neurologist  [place]  . Shortness of breath dyspnea    with exertion  . Sleep apnea    used a cpap when he was 350lb-does not need one now 260lb  . Wears contact lenses     Past Surgical History:  Procedure Laterality Date  . COLONOSCOPY    . KNEE ARTHROSCOPY W/ MENISCAL REPAIR  2000   left  . LUMBAR LAMINECTOMY  2002    Current Outpatient Medications  Medication Sig Dispense Refill  . amLODipine (NORVASC) 10 MG tablet Take 10 mg by mouth daily.    Marland Kitchen  cloNIDine (CATAPRES) 0.1 MG tablet Take 0.1 mg by mouth 2 (two) times daily.    . cyclobenzaprine (FLEXERIL) 10 MG tablet Take 10 mg by mouth 3 (three) times daily as needed.    . diclofenac (VOLTAREN) 50 MG EC tablet Take 50 mg by mouth 2 (two) times daily.    Marland Kitchen gabapentin (NEURONTIN) 300 MG capsule Take 300 mg by mouth 3 (three) times daily.    . insulin lispro (HUMALOG) 100 UNIT/ML injection Inject 30 Units into the skin 2 (two) times daily.    . metFORMIN (GLUCOPHAGE) 1000 MG tablet Take 1 tablet (1,000 mg total) by mouth 2 (two) times daily with a meal. 180 tablet 3  . metoprolol tartrate (LOPRESSOR) 100 MG tablet Take 100 mg by mouth 2 (two) times daily.     . rosuvastatin (CRESTOR) 10 MG tablet Take 1 tablet (10 mg total) by mouth daily. IM PROGRAM 30 tablet 3  . Sofosbuvir-Velpatasvir (EPCLUSA) 400-100 MG TABS Take 1 tablet by mouth daily. 28 tablet 2   No current facility-administered medications for this visit.    Allergies as of 06/14/2019  . (No Known Allergies)    Family History  Problem Relation Age of Onset  . Cancer Mother        pancres  . Diabetes Mother   . Hypertension Brother   . Renal cancer Brother   . Cancer Maternal Uncle   . Colon cancer Maternal Uncle   . Colon cancer Maternal Uncle     Social History   Socioeconomic History  . Marital status: Single    Spouse name: Not on file  . Number of children: Not on file  . Years of education: Not on file  . Highest education level: Not on file  Occupational History  . Not on file  Tobacco Use  . Smoking status: Former Smoker    Quit date: 10/27/2008    Years since quitting: 10.6  . Smokeless tobacco: Never Used  Substance and Sexual Activity  . Alcohol use: Not Currently    Alcohol/week: 2.0 standard drinks    Types: 2 Shots of liquor per week    Comment: occ2-3 x week  . Drug use: No    Types: Marijuana    Comment: Last time 08/14/14-   . Sexual activity: Never  Other Topics Concern  . Not on file  Social History Narrative  . Not on file   Social Determinants of Health   Financial Resource Strain:   . Difficulty of Paying Living Expenses: Not on file  Food Insecurity:   . Worried About Charity fundraiser in the Last Year: Not on file  . Ran Out of Food in the Last Year: Not on file  Transportation Needs:   . Lack of Transportation (Medical): Not on file  . Lack of Transportation (Non-Medical): Not on file  Physical Activity:   . Days of Exercise per Week: Not on file  . Minutes of Exercise per Session: Not on file  Stress:   . Feeling of Stress : Not on file  Social Connections:   . Frequency of Communication with Friends and Family: Not  on file  . Frequency of Social Gatherings with Friends and Family: Not on file  . Attends Religious Services: Not on file  . Active Member of Clubs or Organizations: Not on file  . Attends Archivist Meetings: Not on file  . Marital Status: Not on file  Intimate Partner Violence:   . Fear of  Current or Ex-Partner: Not on file  . Emotionally Abused: Not on file  . Physically Abused: Not on file  . Sexually Abused: Not on file     Physical Exam: BP (!) 184/102   Pulse 64   Temp 98 F (36.7 C)   Ht 5\' 10"  (1.778 m)   Wt 284 lb 3.2 oz (128.9 kg)   BMI 40.78 kg/m  Constitutional: generally well-appearing Psychiatric: alert and oriented x3 Eyes: extraocular movements intact Mouth: oral pharynx moist, no lesions Neck: supple no lymphadenopathy Cardiovascular: heart regular rate and rhythm Lungs: clear to auscultation bilaterally Abdomen: soft, nontender, nondistended, no obvious ascites, no peritoneal signs, normal bowel sounds Extremities: no lower extremity edema bilaterally Skin: no lesions on visible extremities   Assessment and plan: 54 y.o. male with obesity, chronic hepatitis C, routine risk for colon cancer, possible underlying cirrhosis  His ultrasound with elastography are suggestive but not diagnostic of underlying cirrhosis, fibrosis.  Biochemically and clinically he has no signs of liver dysfunction.  He understands that probably the best way to answer the question of whether he has underlying cirrhosis is by repeating a liver biopsy.  He did not have a great experience with the first 1 as most people do not, he understands there is also risk of causing bleeding, infection and other troubles with liver biopsy.  We decided to proceed with colonoscopy for routine colon cancer screening in the same time an upper endoscopy looking for surrogate markers of cirrhosis such as portal gastropathy, gastric or esophageal varices.  I see no reason for any further blood  tests or imaging studies prior to then.  Please see the "Patient Instructions" section for addition details about the plan.   Owens Loffler, MD [facility] 06/14/2019, 10:16 AM  Cc: Rubin Payor Kuppleweiser from ID, Genelle Bal, ID NP in [place].  Total time on date of encounter was 45  minutes (this included time spent preparing to see the patient reviewing records; obtaining and/or reviewing separately obtained history; performing a medically appropriate exam and/or evaluation; counseling and educating the patient and family if present; ordering medications, tests or procedures if applicable; and documenting clinical information in the health record).

## 2019-06-14 NOTE — Patient Instructions (Signed)
If you are age 53 or older, your body mass index should be between 23-30. Your Body mass index is 40.78 kg/m. If this is out of the aforementioned range listed, please consider follow up with your Primary Care Provider.  If you are age 56 or younger, your body mass index should be between 19-25. Your Body mass index is 40.78 kg/m. If this is out of the aformentioned range listed, please consider follow up with your Primary Care Provider.   You have been scheduled for an endoscopy and colonoscopy. Please follow the written instructions given to you at your visit today. Please pick up your prep supplies at the pharmacy within the next 1-3 days. If you use inhalers (even only as needed), please bring them with you on the day of your procedure.  Thank you for entrusting me with your care and choosing [facility].  Dr Ardis Hughs

## 2019-06-16 ENCOUNTER — Telehealth: Payer: Self-pay | Admitting: Gastroenterology

## 2019-06-16 NOTE — Telephone Encounter (Signed)
Left message for patient to return call.  Will continue efforts.  

## 2019-06-20 NOTE — Telephone Encounter (Signed)
I have a sample to give him but I am unable to reach him by phone and voicemail is full so I cannot leave a message. Will try again tomorrow.

## 2019-06-21 NOTE — Telephone Encounter (Signed)
Tried to reach patient again and says voicemail full unable to leave a message.

## 2019-06-22 MED FILL — ROSUVASTATIN CALCIUM 10 MG: 10 | 30 days supply | Qty: 30 | Fill #1

## 2019-06-22 NOTE — Telephone Encounter (Signed)
Tried once again to reach patient and voicemail still full, unable to leave message!

## 2019-06-23 NOTE — Telephone Encounter (Signed)
I have been unable to reach patient , voicemail still full. I have sent him a [facility] message in hopes he will see it.

## 2019-06-29 ENCOUNTER — Telehealth: Payer: Self-pay | Admitting: Pharmacy Technician

## 2019-06-29 NOTE — Telephone Encounter (Signed)
[facility] Patient Advocate Encounter  Received a fax notification from [facility] that they are unable to reach the patient to set up his second shipment.   I called the patient to provide him the phone number to the pharmacy and he stated that he received the second bottle on Monday.  1st shipment: 06/02/2019 2nd shipment: 06/26/2019 He will call [facility] when he has 7 days remaining [phone])  He was aware of his appointment on 03/31 for 4 week f/u

## 2019-07-04 ENCOUNTER — Telehealth: Payer: Self-pay

## 2019-07-04 NOTE — Telephone Encounter (Signed)
COVID-19 Pre-Screening Questions:07/04/19  Do you currently have a fever (>100 F), chills or unexplained body aches? NO   Are you currently experiencing new cough, shortness of breath, sore throat, runny nose? NO .  Have you recently travelled outside the state of [place] in the last 14 days? NO .  Have you been in contact with someone that is currently pending confirmation of Covid19 testing or has been confirmed to have the [place] virus?  NO  **If the patient answers NO to ALL questions -  advise the patient to please call the clinic before coming to the office should any symptoms develop.

## 2019-07-05 ENCOUNTER — Ambulatory Visit (INDEPENDENT_AMBULATORY_CARE_PROVIDER_SITE_OTHER): Payer: Self-pay | Admitting: Pharmacist

## 2019-07-05 ENCOUNTER — Other Ambulatory Visit: Payer: Self-pay

## 2019-07-05 DIAGNOSIS — B182 Chronic viral hepatitis C: Secondary | ICD-10-CM

## 2019-07-05 NOTE — Progress Notes (Signed)
HPI: [name] is a 54 y.o. male who presents to the [facility] clinic for Hepatitis C follow-up.  Medication: Epclusa x 12 weeks  Start Date: 06/02/19  Hepatitis C Genotype: 1a  Fibrosis Score: non-cirrhotic  Hepatitis C RNA: 16.5 million on 05/10/19  Patient Active Problem List   Diagnosis Date Noted  . Underinsured 05/15/2019  . Hyperlipidemia 05/10/2019  . Chronic hepatitis C ([place]) 05/10/2019  . Chronic cough 05/10/2019  . Abnormal CT scan of lung 05/10/2019  . Diabetes ([place]) 01/29/2016  . Essential hypertension 01/29/2016  . Lumbar facet arthropathy 08/29/2014    Patient's Medications  New Prescriptions   No medications on file  Previous Medications   AMLODIPINE (NORVASC) 10 MG TABLET    Take 10 mg by mouth daily.   CLONIDINE (CATAPRES) 0.1 MG TABLET    Take 0.1 mg by mouth 2 (two) times daily.   CYCLOBENZAPRINE (FLEXERIL) 10 MG TABLET    Take 10 mg by mouth 3 (three) times daily as needed.   DICLOFENAC (VOLTAREN) 50 MG EC TABLET    Take 50 mg by mouth 2 (two) times daily.   GABAPENTIN (NEURONTIN) 300 MG CAPSULE    Take 300 mg by mouth 3 (three) times daily.   INSULIN LISPRO (HUMALOG) 100 UNIT/ML INJECTION    Inject 30 Units into the skin 2 (two) times daily.   METFORMIN (GLUCOPHAGE) 1000 MG TABLET    Take 1 tablet (1,000 mg total) by mouth 2 (two) times daily with a meal.   METOPROLOL TARTRATE (LOPRESSOR) 100 MG TABLET    Take 100 mg by mouth 2 (two) times daily.   NA SULFATE-K SULFATE-MG SULF (SUPREP BOWEL PREP KIT) 17.5-3.13-1.6 GM/177ML SOLN    Take 1 kit by mouth as directed.   ROSUVASTATIN (CRESTOR) 10 MG TABLET    Take 1 tablet (10 mg total) by mouth daily. IM PROGRAM   SOFOSBUVIR-VELPATASVIR (EPCLUSA) 400-100 MG TABS    Take 1 tablet by mouth daily.  Modified Medications   No medications on file  Discontinued Medications   No medications on file    Allergies: No Known Allergies  Past Medical History: Past Medical History:  Diagnosis Date  .  Anxiety    Panic Atacks  . Arthritis   . Carpal tunnel syndrome   . Constipation   . Depression   . Diabetes mellitus    DMII  . Dysrhythmia    hx tachy hr  . GERD (gastroesophageal reflux disease)    no problem in past few years.  . Hepatitis   . Hypertension   . Neuropathy associated with endocrine disorder ([place])   . Seizures ([place])    "stress related" no more since taking Klonopin 2 years- See Neurologist  [facility]  . Shortness of breath dyspnea    with exertion  . Sleep apnea    used a cpap when he was 350lb-does not need one now 260lb  . Wears contact lenses     Social History: Social History   Socioeconomic History  . Marital status: Single    Spouse name: Not on file  . Number of children: Not on file  . Years of education: Not on file  . Highest education level: Not on file  Occupational History  . Not on file  Tobacco Use  . Smoking status: Former Smoker    Quit date: 10/27/2008    Years since quitting: 10.6  . Smokeless tobacco: Never Used  Substance and Sexual Activity  . Alcohol use:  Not Currently    Alcohol/week: 2.0 standard drinks    Types: 2 Shots of liquor per week    Comment: occ2-3 x week  . Drug use: No    Types: Marijuana    Comment: Last time 08/14/14-   . Sexual activity: Never  Other Topics Concern  . Not on file  Social History Narrative  . Not on file   Social Determinants of Health   Financial Resource Strain:   . Difficulty of Paying Living Expenses:   Food Insecurity:   . Worried About Charity fundraiser in the Last Year:   . Arboriculturist in the Last Year:   Transportation Needs:   . Film/video editor (Medical):   Marland Kitchen Lack of Transportation (Non-Medical):   Physical Activity:   . Days of Exercise per Week:   . Minutes of Exercise per Session:   Stress:   . Feeling of Stress :   Social Connections:   . Frequency of Communication with Friends and Family:   . Frequency of Social Gatherings with Friends and Family:    . Attends Religious Services:   . Active Member of Clubs or Organizations:   . Attends Archivist Meetings:   Marland Kitchen Marital Status:     Labs: Hepatitis C Lab Results  Component Value Date   HCVGENOTYPE 1a 05/16/2019   Hepatitis B Lab Results  Component Value Date   HEPBSAB NON-REACTIVE 05/16/2019   Hepatitis A No results found for: HAV HIV No results found for: HIV Lab Results  Component Value Date   CREATININE 1.10 05/10/2019   CREATININE 0.73 01/29/2016   CREATININE 0.67 08/27/2014   CREATININE 0.60 10/24/2010   CREATININE 0.50 10/23/2010   Lab Results  Component Value Date   AST 54 (H) 05/10/2019   AST 130 (H) 01/29/2016   AST 49 (H) 08/27/2014   ALT 57 (H) 05/16/2019   ALT 63 (H) 05/10/2019   ALT 173 (H) 01/29/2016   INR 1.0 05/16/2019    Assessment: [name] is here today to follow up for his chronic Hepatitis C infection.  He started 12 weeks of Ecplusa at the end of February. He is tolerating it very well with some occasional fatigue but nothing he cannot handle. He takes it every evening around dinner time at 7pm. He has missed no doses and has no issues getting it from the pharmacy. He has started his 2nd month already. Congratulated him on his perfect adherence and encouraged continued compliance. Reminded him that he has one refill left. He started taking Crestor recently but at a reduced dose as not to interact with Ecplusa.  He separates the two medications. Will check labs today and see him back when he has completed treatment.  Plan: - Continue Epclusa x 12 weeks - Hep C RNA + CMET today - F/u with me again at end of treatment  Carly Sabo L. Estelle Skibicki, PharmD, BCIDP, AAHIVP, CPP Clinical Pharmacist Practitioner [facility] 07/05/2019, 1:53 PM

## 2019-07-08 LAB — COMPREHENSIVE METABOLIC PANEL
AG Ratio: 0.7 (calc) — ABNORMAL LOW (ref 1.0–2.5)
ALT: 20 U/L (ref 9–46)
AST: 25 U/L (ref 10–35)
Albumin: 3 g/dL — ABNORMAL LOW (ref 3.6–5.1)
Alkaline phosphatase (APISO): 76 U/L (ref 35–144)
BUN: 23 mg/dL (ref 7–25)
CO2: 31 mmol/L (ref 20–32)
Calcium: 9.4 mg/dL (ref 8.6–10.3)
Chloride: 100 mmol/L (ref 98–110)
Creat: 1.31 mg/dL (ref 0.70–1.33)
Globulin: 4.1 g/dL (calc) — ABNORMAL HIGH (ref 1.9–3.7)
Glucose, Bld: 358 mg/dL — ABNORMAL HIGH (ref 65–99)
Potassium: 5.2 mmol/L (ref 3.5–5.3)
Sodium: 135 mmol/L (ref 135–146)
Total Bilirubin: 0.4 mg/dL (ref 0.2–1.2)
Total Protein: 7.1 g/dL (ref 6.1–8.1)

## 2019-07-08 LAB — HEPATITIS C RNA QUANTITATIVE
HCV Quantitative Log: <1.18 Log IU/mL
HCV RNA, PCR, QN: <15 IU/mL

## 2019-07-13 ENCOUNTER — Other Ambulatory Visit: Payer: Self-pay

## 2019-07-13 ENCOUNTER — Other Ambulatory Visit: Payer: Self-pay | Admitting: Gastroenterology

## 2019-07-13 ENCOUNTER — Ambulatory Visit (INDEPENDENT_AMBULATORY_CARE_PROVIDER_SITE_OTHER): Payer: Self-pay

## 2019-07-13 DIAGNOSIS — Z1159 Encounter for screening for other viral diseases: Secondary | ICD-10-CM

## 2019-07-14 LAB — SARS CORONAVIRUS 2 (TAT 6-24 HRS): SARS Coronavirus 2: NEGATIVE

## 2019-07-17 ENCOUNTER — Other Ambulatory Visit: Payer: Self-pay

## 2019-07-17 ENCOUNTER — Ambulatory Visit (AMBULATORY_SURGERY_CENTER): Payer: Self-pay | Admitting: Gastroenterology

## 2019-07-17 ENCOUNTER — Encounter: Payer: Self-pay | Admitting: Gastroenterology

## 2019-07-17 VITALS — BP 150/90 | HR 85 | Temp 93.2°F | Resp 18 | Ht 70.0 in | Wt 284.0 lb

## 2019-07-17 DIAGNOSIS — B182 Chronic viral hepatitis C: Secondary | ICD-10-CM

## 2019-07-17 DIAGNOSIS — Z1211 Encounter for screening for malignant neoplasm of colon: Secondary | ICD-10-CM

## 2019-07-17 MED ORDER — SODIUM CHLORIDE 0.9 % IV SOLN: 500.0000 mL | Freq: Once | INTRAVENOUS | Status: DC

## 2019-07-17 NOTE — Op Note (Signed)
[facility] Patient Name: [name] Procedure Date: 07/17/2019 2:42 PM MRN: [id number] Endoscopist: Milus Banister , MD Age: 54 Referring MD:  Date of Birth: [birth year] Gender: Male Account #: [id number] Procedure:                Colonoscopy Indications:              Screening for colorectal malignant neoplasm Medicines:                Monitored Anesthesia Care Procedure:                Pre-Anesthesia Assessment:                           - Prior to the procedure, a History and Physical                            was performed, and patient medications and                            allergies were reviewed. The patient's tolerance of                            previous anesthesia was also reviewed. The risks                            and benefits of the procedure and the sedation                            options and risks were discussed with the patient.                            All questions were answered, and informed consent                            was obtained. Prior Anticoagulants: The patient has                            taken no previous anticoagulant or antiplatelet                            agents. ASA Grade Assessment: II - A patient with                            mild systemic disease. After reviewing the risks                            and benefits, the patient was deemed in                            satisfactory condition to undergo the procedure.                           After obtaining informed consent, the colonoscope  was passed under direct vision. Throughout the                            procedure, the patient's blood pressure, pulse, and                            oxygen saturations were monitored continuously. The                            Colonoscope was introduced through the anus and                            advanced to the the cecum, identified by                            appendiceal orifice and  ileocecal valve. The                            colonoscopy was performed without difficulty. The                            patient tolerated the procedure well. The quality                            of the bowel preparation was good. The ileocecal                            valve, appendiceal orifice, and rectum were                            photographed. Scope In: 2:44:45 PM Scope Out: 3:05:33 PM Scope Withdrawal Time: 0 hours 17 minutes 9 seconds  Total Procedure Duration: 0 hours 20 minutes 48 seconds  Findings:                 The entire examined colon appeared normal on direct                            and retroflexion views. Complications:            No immediate complications. Estimated blood loss:                            None. Estimated Blood Loss:     Estimated blood loss: none. Impression:               - The entire examined colon is normal on direct and                            retroflexion views.                           - No polyps or cancers. Recommendation:           - Patient has a contact number available for  emergencies. The signs and symptoms of potential                            delayed complications were discussed with the                            patient. Return to normal activities tomorrow.                            Written discharge instructions were provided to the                            patient.                           - Resume previous diet.                           - Continue present medications.                           - Repeat colonoscopy in 10 years for screening. Milus Banister, MD 07/17/2019 3:07:35 PM This report has been signed electronically.

## 2019-07-17 NOTE — Patient Instructions (Signed)
YOU HAD AN ENDOSCOPIC PROCEDURE TODAY AT THE [facility]:   Refer to the procedure report that was given to you for any specific questions about what was found during the examination.  If the procedure report does not answer your questions, please call your gastroenterologist to clarify.  If you requested that your care partner not be given the details of your procedure findings, then the procedure report has been included in a sealed envelope for you to review at your convenience later.  YOU SHOULD EXPECT: Some feelings of bloating in the abdomen. Passage of more gas than usual.  Walking can help get rid of the air that was put into your GI tract during the procedure and reduce the bloating. If you had a lower endoscopy (such as a colonoscopy or flexible sigmoidoscopy) you may notice spotting of blood in your stool or on the toilet paper. If you underwent a bowel prep for your procedure, you may not have a normal bowel movement for a few days.  Please Note:  You might notice some irritation and congestion in your nose or some drainage.  This is from the oxygen used during your procedure.  There is no need for concern and it should clear up in a day or so.  SYMPTOMS TO REPORT IMMEDIATELY:   Following lower endoscopy (colonoscopy or flexible sigmoidoscopy):  Excessive amounts of blood in the stool  Significant tenderness or worsening of abdominal pains  Swelling of the abdomen that is new, acute  Fever of 100F or higher   Following upper endoscopy (EGD)  Vomiting of blood or coffee ground material  New chest pain or pain under the shoulder blades  Painful or persistently difficult swallowing  New shortness of breath  Fever of 100F or higher  Black, tarry-looking stools  For urgent or emergent issues, a gastroenterologist can be reached at any hour by calling [phone]. Do not use MyChart messaging for urgent concerns.    DIET:  We do recommend a small meal at first, but  then you may proceed to your regular diet.  Drink plenty of fluids but you should avoid alcoholic beverages for 24 hours.  ACTIVITY:  You should plan to take it easy for the rest of today and you should NOT DRIVE or use heavy machinery until tomorrow (because of the sedation medicines used during the test).    FOLLOW UP: Our staff will call the number listed on your records 48-72 hours following your procedure to check on you and address any questions or concerns that you may have regarding the information given to you following your procedure. If we do not reach you, we will leave a message.  We will attempt to reach you two times.  During this call, we will ask if you have developed any symptoms of COVID 19. If you develop any symptoms (ie: fever, flu-like symptoms, shortness of breath, cough etc.) before then, please call [phone].  If you test positive for Covid 19 in the 2 weeks post procedure, please call and report this information to us.    If any biopsies were taken you will be contacted by phone or by letter within the next 1-3 weeks.  Please call us at [phone] if you have not heard about the biopsies in 3 weeks.    SIGNATURES/CONFIDENTIALITY: You and/or your care partner have signed paperwork which will be entered into your electronic medical record.  These signatures attest to the fact that that the information above on   your After Visit Summary has been reviewed and is understood.  Full responsibility of the confidentiality of this discharge information lies with you and/or your care-partner. 

## 2019-07-17 NOTE — Progress Notes (Signed)
Report to PACU, RN, vss, BBS= Clear.  

## 2019-07-17 NOTE — Progress Notes (Signed)
Temp by ADB. VS by DT

## 2019-07-17 NOTE — Op Note (Signed)
[facility] Patient Name: [name] Procedure Date: 07/17/2019 2:41 PM MRN: [id number] Endoscopist: Milus Banister , MD Age: 54 Referring MD:  Date of Birth: July 03, 1965 Gender: Male Account #: [id number] Procedure:                Upper GI endoscopy Indications:              Possible cirrhosis based on [place] with elastography,                            Hep C Medicines:                Monitored Anesthesia Care Procedure:                Pre-Anesthesia Assessment:                           - Prior to the procedure, a History and Physical                            was performed, and patient medications and                            allergies were reviewed. The patient's tolerance of                            previous anesthesia was also reviewed. The risks                            and benefits of the procedure and the sedation                            options and risks were discussed with the patient.                            All questions were answered, and informed consent                            was obtained. Prior Anticoagulants: The patient has                            taken no previous anticoagulant or antiplatelet                            agents. ASA Grade Assessment: II - A patient with                            mild systemic disease. After reviewing the risks                            and benefits, the patient was deemed in                            satisfactory condition to undergo the procedure.  After obtaining informed consent, the endoscope was                            passed under direct vision. Throughout the                            procedure, the patient's blood pressure, pulse, and                            oxygen saturations were monitored continuously. The                            Endoscope was introduced through the mouth, and                            advanced to the second part of duodenum. The upper                            GI endoscopy was accomplished without difficulty.                            The patient tolerated the procedure well. Scope In: Scope Out: Findings:                 The esophagus was normal.                           The stomach was normal.                           The examined duodenum was normal. Complications:            No immediate complications. Estimated blood loss:                            None. Estimated Blood Loss:     Estimated blood loss: none. Impression:               - Normal UGI tract.                           - No signs of portal hypertension.                           - Overall, it is very unlikely that you have                            cirrhosis given normal lab tests, normal UGI tract                            on this endoscopic examination. Recommendation:           - Patient has a contact number available for                            emergencies. The signs and symptoms of potential  delayed complications were discussed with the                            patient. Return to normal activities tomorrow.                            Written discharge instructions were provided to the                            patient.                           - Resume previous diet.                           - Continue present medications. Milus Banister, MD 07/17/2019 3:15:20 PM This report has been signed electronically.

## 2019-07-19 ENCOUNTER — Telehealth: Payer: Self-pay | Admitting: *Deleted

## 2019-07-19 ENCOUNTER — Telehealth: Payer: Self-pay

## 2019-07-19 NOTE — Telephone Encounter (Signed)
  Follow up Call-  Call back number 07/17/2019  Post procedure Call Back phone  # [phone]  Permission to leave phone message Yes  Some recent data might be hidden     Patient questions:  Do you have a fever, pain , or abdominal swelling? No. Pain Score  0 *  Have you tolerated food without any problems? Yes.    Have you been able to return to your normal activities? Yes.    Do you have any questions about your discharge instructions: Diet   No. Medications  No. Follow up visit  No.  Do you have questions or concerns about your Care? No.  Actions: * If pain score is 4 or above: No action needed, pain <4.  1. Have you developed a fever since your procedure? no  2.   Have you had an respiratory symptoms (SOB or cough) since your procedure? no  3.   Have you tested positive for COVID 19 since your procedure no  4.   Have you had any family members/close contacts diagnosed with the COVID 19 since your procedure?  no   If yes to any of these questions please route to Joylene John, RN and Erenest Rasher, RN

## 2019-07-19 NOTE — Telephone Encounter (Signed)
First attempt follow up call to pt, lm on vm 

## 2019-07-25 MED FILL — ROSUVASTATIN CALCIUM 10 MG: 10 | 30 days supply | Qty: 30 | Fill #2

## 2019-08-14 ENCOUNTER — Encounter: Payer: Self-pay | Admitting: Internal Medicine

## 2019-08-14 ENCOUNTER — Other Ambulatory Visit: Payer: Self-pay | Admitting: Internal Medicine

## 2019-08-14 ENCOUNTER — Ambulatory Visit (INDEPENDENT_AMBULATORY_CARE_PROVIDER_SITE_OTHER): Payer: Self-pay | Admitting: Internal Medicine

## 2019-08-14 ENCOUNTER — Other Ambulatory Visit: Payer: Self-pay

## 2019-08-14 VITALS — BP 189/116 | HR 108 | Temp 98.9°F | Ht 70.0 in | Wt 278.8 lb

## 2019-08-14 DIAGNOSIS — E785 Hyperlipidemia, unspecified: Secondary | ICD-10-CM

## 2019-08-14 DIAGNOSIS — E119 Type 2 diabetes mellitus without complications: Secondary | ICD-10-CM

## 2019-08-14 DIAGNOSIS — Z794 Long term (current) use of insulin: Secondary | ICD-10-CM

## 2019-08-14 DIAGNOSIS — Z79899 Other long term (current) drug therapy: Secondary | ICD-10-CM

## 2019-08-14 DIAGNOSIS — I10 Essential (primary) hypertension: Secondary | ICD-10-CM

## 2019-08-14 LAB — POCT GLYCOSYLATED HEMOGLOBIN (HGB A1C): Hemoglobin A1C: 12.9 % — AB (ref 4.0–5.6)

## 2019-08-14 LAB — GLUCOSE, CAPILLARY: Glucose-Capillary: 264 mg/dL — ABNORMAL HIGH (ref 70–99)

## 2019-08-14 MED ORDER — "INSULIN SYRINGE 31G X 5/16"" 0.3 ML MISC": 3 refills | Status: DC

## 2019-08-14 MED ORDER — CLONIDINE HCL 0.1 MG PO TABS: 0.1000 mg | ORAL_TABLET | Freq: Two times a day (BID) | ORAL | 3 refills | Status: DC

## 2019-08-14 MED ORDER — METFORMIN HCL 1000 MG PO TABS: 1000.0000 mg | ORAL_TABLET | Freq: Two times a day (BID) | ORAL | 3 refills | Status: DC

## 2019-08-14 MED ORDER — AMLODIPINE BESYLATE 10 MG PO TABS: 10.0000 mg | ORAL_TABLET | Freq: Every day | ORAL | 3 refills | Status: DC

## 2019-08-14 MED ORDER — METOPROLOL TARTRATE 100 MG PO TABS: 100.0000 mg | ORAL_TABLET | Freq: Two times a day (BID) | ORAL | 3 refills | Status: DC

## 2019-08-14 MED ORDER — INSULIN LISPRO 100 UNIT/ML ~~LOC~~ SOLN: 30.0000 [IU] | Freq: Two times a day (BID) | SUBCUTANEOUS | 3 refills | Status: DC

## 2019-08-14 NOTE — Patient Instructions (Signed)
[name],  It was a pleasure meeting you today. Today we discussed your hypertension and diabetes medications. We will reorder your medications today. Please continue them as they are prescribed. We will have you follow up in our clinic once your orange card is processed to continue your medical work up. Have a good day!  Sincerely,  Maudie Mercury, MD

## 2019-08-14 NOTE — Assessment & Plan Note (Addendum)
Patient following up for his Diabetes Mellitis. Patient states he is typically on Humalog 30U BID and Metformin 1000 BID. He states that he has not been able to buy his diabetic medications after losing his job in December of 2020. He has a friend from church who is buying him a insulin for 3 dollars, but does not know the name of the insulin. He states that his blood sugars, when he checks, have been sitting in the mid 250s.   Additionally, patient states that he was being seen by a podiatrist for his diabetic R foot, which he had an ulceration a year ago. States that the ulcer was treated and resolved. But is having difficulty walking with his foot.   Assessment:  Patient with DMT2 presents to the clinic off his medications due to financial barriers. A1c 12.9 today.    On inspection of his foot, the arch has collapsed, but no lacerations, erythema, drainage, or ulcerations. No indication to start antibiotics at this time.    Patient is filling out for orange card. We will place refills for his medications and have a him follow up in a month.   Plan:  - Metformin 1000 mg BID reordered - Humalog 30U BID reordered

## 2019-08-14 NOTE — Progress Notes (Signed)
   CC: Diabetes  HPI:  [name] a 54 y.o. male, with a PMH below, who presents to the clinic for follow up for his diabetes. To see the management of his acute and chronic conditions, please see the separate Assessment and Plan note under the encounter tab.   Past Medical History:  Diagnosis Date  . Anxiety    Panic Atacks  . Arthritis   . Carpal tunnel syndrome   . Constipation   . Depression   . Diabetes mellitus    DMII  . Dysrhythmia    hx tachy hr  . GERD (gastroesophageal reflux disease)    no problem in past few years.  . Hepatitis   . Hyperlipidemia   . Hypertension   . Neuropathy associated with endocrine disorder ([place])   . Seizures ([place])    "stress related" no more since taking Klonopin 2 years- See Neurologist  [facility]  . Shortness of breath dyspnea    with exertion  . Sleep apnea    used a cpap when he was 350lb-does not need one now 260lb  . Wears contact lenses    Review of Systems:   Review of Systems  Constitutional: Negative for chills and fever.  Eyes: Negative for blurred vision and double vision.  Cardiovascular: Positive for palpitations. Negative for chest pain, orthopnea and leg swelling.  Neurological: Negative for tingling and headaches.    Physical Exam:  Vitals:   08/14/19 1317  BP: (!) 189/116  Pulse: (!) 108  Temp: 98.9 F (37.2 C)  TempSrc: Oral  SpO2: 100%  Weight: 278 lb 12.8 oz (126.5 kg)  Height: 5\' 10"  (1.778 m)   Physical Exam Constitutional:      General: He is not in acute distress.    Appearance: He is obese. He is not ill-appearing or diaphoretic.  Cardiovascular:     Rate and Rhythm: Regular rhythm. Tachycardia present.     Pulses: Normal pulses.     Heart sounds: Normal heart sounds. No murmur. No friction rub. No gallop.   Pulmonary:     Effort: Pulmonary effort is normal.     Breath sounds: Normal breath sounds. No wheezing, rhonchi or rales.  Abdominal:     General: Abdomen is flat. Bowel  sounds are normal.     Tenderness: There is no abdominal tenderness. There is no guarding.  Musculoskeletal:     Right lower leg: No edema.     Left lower leg: No edema.     Comments: RLE foot with collapsed arch, no lacerations, edema, ulcerations appreciated on physical examination. Sensation intact, pulses intact.   LLE foot arch intact, no lacerations, edema, ulcerations appreciated. Sensation intact, Pulses intact.   Skin:    General: Skin is warm.  Neurological:     Mental Status: He is alert and oriented to person, place, and time.  Psychiatric:        Mood and Affect: Mood normal.        Behavior: Behavior normal.     Assessment & Plan:   See Encounters Tab for problem based charting.  Patient discussed with Dr. [name]

## 2019-08-15 ENCOUNTER — Encounter: Payer: Self-pay | Admitting: Internal Medicine

## 2019-08-15 MED ORDER — "INSULIN SYRINGE 31G X 5/16"" 0.3 ML MISC": 3 refills | Status: DC

## 2019-08-15 NOTE — Assessment & Plan Note (Signed)
Patient presents to the clinic with a BP of 189/116 with a HR of 108. He states that since last being seen by Dr. Eileen Stanford, he has run out of all of his medications for several months, due to lapse in insurance and loss of his job in December of 2020. He states that he typically takes Clonidine 0.1 mg BID, Metoprolol 100 mg BID, and Amlodipine 10 mg daily. He endorses feeling heart palpitations throughout the day, which he states was well controlled on his previous regimen.   Patient presenting with elevated pressures. During his last meeting his HTN was well managed with reading of 130/83. Patient having difficulty fulfilling prescriptions due to lack of insurance. Patient applying for orange card to help with paying for medications.   Plan:  Refill clonidine 0.1 mg BID, Metoprolol 100 mg BID, and amlodipine 10 mg QD.  - Reassess in one month, hopefully patient will qualify for orange card to continue workup.  - CMP Next visit.

## 2019-08-15 NOTE — Assessment & Plan Note (Signed)
Patient on Crestor 10 mg QD. Originally on Crestor 40 mg, but due to financial constraints and interaction with patient's Hep C medications, dose lowered to 10 mg QD.   Plan:  - Continue Crestor 10 mg QD.

## 2019-08-16 NOTE — Progress Notes (Signed)
Internal Medicine Clinic Attending  Case discussed with Dr. Winters at the time of the visit.  We reviewed the resident's history and exam and pertinent patient test results.  I agree with the assessment, diagnosis, and plan of care documented in the resident's note.  Lyndon Chenoweth, M.D., Ph.D.  

## 2019-08-29 ENCOUNTER — Ambulatory Visit (INDEPENDENT_AMBULATORY_CARE_PROVIDER_SITE_OTHER): Payer: Self-pay | Admitting: Pharmacist

## 2019-08-29 ENCOUNTER — Other Ambulatory Visit: Payer: Self-pay

## 2019-08-29 DIAGNOSIS — B182 Chronic viral hepatitis C: Secondary | ICD-10-CM

## 2019-08-29 NOTE — Progress Notes (Signed)
HPI: [name] is a 54 y.o. male who presents to the [facility] clinic for Hepatitis C follow-up.  Medication: Epclusa x 12 weeks  Start Date: 06/02/19  Hepatitis C Genotype: 1a  Fibrosis Score: non-cirrhotic  Hepatitis C RNA: 16.5 million on 05/10/19  Patient Active Problem List   Diagnosis Date Noted  . Underinsured 05/15/2019  . Hyperlipidemia 05/10/2019  . Chronic hepatitis C ([place]) 05/10/2019  . Chronic cough 05/10/2019  . Abnormal CT scan of lung 05/10/2019  . Diabetes ([place]) 01/29/2016  . Essential hypertension 01/29/2016  . Lumbar facet arthropathy 08/29/2014    Patient's Medications  New Prescriptions   No medications on file  Previous Medications   AMLODIPINE (NORVASC) 10 MG TABLET    Take 1 tablet (10 mg total) by mouth daily.   CLONIDINE (CATAPRES) 0.1 MG TABLET    Take 1 tablet (0.1 mg total) by mouth 2 (two) times daily.   CYCLOBENZAPRINE (FLEXERIL) 10 MG TABLET    Take 10 mg by mouth 3 (three) times daily as needed.   DICLOFENAC (VOLTAREN) 50 MG EC TABLET    Take 50 mg by mouth 2 (two) times daily.   GABAPENTIN (NEURONTIN) 300 MG CAPSULE    Take 300 mg by mouth 3 (three) times daily.   INSULIN LISPRO (HUMALOG) 100 UNIT/ML INJECTION    Inject 0.3 mLs (30 Units total) into the skin 2 (two) times daily.   INSULIN SYRINGE-NEEDLE U-100 (INSULIN SYRINGE .3CC/31GX5/16") 31G X 5/16" 0.3 ML MISC    The patient is insulin requiring, ICD 10 code E11.9. The uses insulin 2 times per day.   METFORMIN (GLUCOPHAGE) 1000 MG TABLET    Take 1 tablet (1,000 mg total) by mouth 2 (two) times daily with a meal.   METOPROLOL TARTRATE (LOPRESSOR) 100 MG TABLET    Take 1 tablet (100 mg total) by mouth 2 (two) times daily.   ROSUVASTATIN (CRESTOR) 10 MG TABLET    Take 1 tablet (10 mg total) by mouth daily. IM PROGRAM   SOFOSBUVIR-VELPATASVIR (EPCLUSA) 400-100 MG TABS    Take 1 tablet by mouth daily.  Modified Medications   No medications on file  Discontinued Medications   No medications on file    Allergies: No Known Allergies  Past Medical History: Past Medical History:  Diagnosis Date  . Anxiety    Panic Atacks  . Arthritis   . Carpal tunnel syndrome   . Constipation   . Depression   . Diabetes mellitus    DMII  . Dysrhythmia    hx tachy hr  . GERD (gastroesophageal reflux disease)    no problem in past few years.  . Hepatitis   . Hyperlipidemia   . Hypertension   . Neuropathy associated with endocrine disorder ([place])   . Seizures ([place])    "stress related" no more since taking Klonopin 2 years- See Neurologist  [facility]  . Shortness of breath dyspnea    with exertion  . Sleep apnea    used a cpap when he was 350lb-does not need one now 260lb  . Wears contact lenses     Social History: Social History   Socioeconomic History  . Marital status: Single    Spouse name: Not on file  . Number of children: Not on file  . Years of education: Not on file  . Highest education level: Not on file  Occupational History  . Not on file  Tobacco Use  . Smoking status: Former Smoker  Quit date: 10/27/2008    Years since quitting: 10.8  . Smokeless tobacco: Never Used  . Tobacco comment: Quit x 10 yrs.  Substance and Sexual Activity  . Alcohol use: Not Currently    Alcohol/week: 2.0 standard drinks    Types: 2 Shots of liquor per week    Comment: x 1 year.  . Drug use: No    Types: Marijuana    Comment: Last time 08/14/14-   . Sexual activity: Never  Other Topics Concern  . Not on file  Social History Narrative  . Not on file   Social Determinants of Health   Financial Resource Strain:   . Difficulty of Paying Living Expenses:   Food Insecurity:   . Worried About Charity fundraiser in the Last Year:   . Arboriculturist in the Last Year:   Transportation Needs:   . Film/video editor (Medical):   Marland Kitchen Lack of Transportation (Non-Medical):   Physical Activity:   . Days of Exercise per Week:   . Minutes of Exercise per  Session:   Stress:   . Feeling of Stress :   Social Connections:   . Frequency of Communication with Friends and Family:   . Frequency of Social Gatherings with Friends and Family:   . Attends Religious Services:   . Active Member of Clubs or Organizations:   . Attends Archivist Meetings:   Marland Kitchen Marital Status:     Labs: Hepatitis C Lab Results  Component Value Date   HCVGENOTYPE 1a 05/16/2019   HCVRNAPCRQN <15 NOT DETECTED 07/05/2019   Hepatitis B Lab Results  Component Value Date   HEPBSAB NON-REACTIVE 05/16/2019   Hepatitis A No results found for: HAV HIV No results found for: HIV Lab Results  Component Value Date   CREATININE 1.31 07/05/2019   CREATININE 1.10 05/10/2019   CREATININE 0.73 01/29/2016   CREATININE 0.67 08/27/2014   CREATININE 0.60 10/24/2010   Lab Results  Component Value Date   AST 25 07/05/2019   AST 54 (H) 05/10/2019   AST 130 (H) 01/29/2016   ALT 20 07/05/2019   ALT 57 (H) 05/16/2019   ALT 63 (H) 05/10/2019   INR 1.0 05/16/2019    Assessment: Rowe presents today for his end of treatment follow-up appointment after completing Epclusa x 12 weeks for Hepatitis C management. He was completely adherent to Epclusa 1 tablet daily with food, did not missed any doses and denied any side effects. His last dose was Wednesday, May 19. Current viral load as of 07/05/19 is undetectable and LFTs wnl. He was seen by gastroenterology on 4/12 and Dr. Ardis Hughs indicated that its very unlikely he has cirrhosis given normal lab tests, normal UGI tract on his endoscopic procedure.    Discussed the importance of limitingrisky behavior as he can still be reinfected with HepC even though he has completed treatment. Also explained that he will always have positive Hepatitis C antibodies and should notify future healthcare providers that he has completed treatment. Explained that he can return to his normal dose of Rosuvastatin since he has completed HepC  treatment. Patient verbalized understanding. Will check HCV viral load today.   Plan: - Labs: Hepatitis C RNA viral load - F/u with Cassie for 12-week SVR visit on 8/17  Lorel Monaco, PharmD PGY1 [facility] 08/29/2019, 2:47 PM

## 2019-08-31 LAB — HEPATITIS C RNA QUANTITATIVE
HCV Quantitative Log: <1.18 Log IU/mL
HCV RNA, PCR, QN: <15 IU/mL

## 2019-09-06 ENCOUNTER — Ambulatory Visit: Payer: Self-pay

## 2019-09-18 ENCOUNTER — Ambulatory Visit: Payer: Self-pay | Admitting: Internal Medicine

## 2019-09-18 ENCOUNTER — Other Ambulatory Visit: Payer: Self-pay

## 2019-09-18 ENCOUNTER — Encounter: Payer: Self-pay | Admitting: Internal Medicine

## 2019-09-18 DIAGNOSIS — I10 Essential (primary) hypertension: Secondary | ICD-10-CM

## 2019-09-18 DIAGNOSIS — E119 Type 2 diabetes mellitus without complications: Secondary | ICD-10-CM

## 2019-09-18 DIAGNOSIS — E1142 Type 2 diabetes mellitus with diabetic polyneuropathy: Secondary | ICD-10-CM

## 2019-09-18 DIAGNOSIS — E785 Hyperlipidemia, unspecified: Secondary | ICD-10-CM

## 2019-09-18 MED ORDER — ROSUVASTATIN CALCIUM 20 MG PO TABS
40.0000 mg | ORAL_TABLET | Freq: Every day | ORAL | 1 refills | Status: DC
Start: 2019-09-18 — End: 2020-01-22

## 2019-09-18 MED ORDER — GABAPENTIN 300 MG PO CAPS: 300.0000 mg | ORAL_CAPSULE | Freq: Three times a day (TID) | ORAL | 3 refills | Status: DC

## 2019-09-18 MED FILL — GABAPENTIN 300 MG CAPSULE: 300 | 30 days supply | Qty: 90 | Fill #0

## 2019-09-18 MED FILL — ROSUVASTATIN CALCIUM 20 MG: 20 | 30 days supply | Qty: 60 | Fill #0

## 2019-09-18 NOTE — Patient Instructions (Addendum)
To Mr. Gaut,  It was a pleasure meeting you today. Today we discussed your hypertension and diabetes. For your hypertension, continue taking the medications you are currently on. Pending your orange card application, we can optimize your medications and get blood work. Additionally, for your diabetes medications, please hold your Humalog at this time. You will begin a medication called lantus and we will start you on 25 units once daily for your diabetes. We will follow up in two month's time and readjust. Have a good day!  Sincerely, Maudie Mercury, MD

## 2019-09-18 NOTE — Progress Notes (Deleted)
   CC: ***  HPI:  Mr.Hristopher E Paletta is a 54 y.o.   Past Medical History:  Diagnosis Date  . Anxiety    Panic Atacks  . Arthritis   . Carpal tunnel syndrome   . Constipation   . Depression   . Diabetes mellitus    DMII  . Dysrhythmia    hx tachy hr  . GERD (gastroesophageal reflux disease)    no problem in past few years.  . Hepatitis   . Hyperlipidemia   . Hypertension   . Neuropathy associated with endocrine disorder ([place])   . Seizures ([place])    "stress related" no more since taking Klonopin 2 years- See Neurologist  [facility]  . Shortness of breath dyspnea    with exertion  . Sleep apnea    used a cpap when he was 350lb-does not need one now 260lb  . Wears contact lenses    Review of Systems:  ***  Physical Exam:  Vitals:   09/18/19 1540  BP: (!) 147/72  Pulse: 66  Temp: 98.6 F (37 C)  TempSrc: Oral  SpO2: 100%  Weight: 276 lb 12.8 oz (125.6 kg)  Height: 5\' 10"  (1.778 m)   ***  Assessment & Plan:   See Encounters Tab for problem based charting.  Patient {GC/GE:[id number]::"discussed with","seen with"} Dr. {NAMES:[id number]::"Butcher","Guilloud","Hoffman","Mullen","Narendra","Raines","Vincent"}

## 2019-09-19 ENCOUNTER — Encounter: Payer: Self-pay | Admitting: Internal Medicine

## 2019-09-19 DIAGNOSIS — E1140 Type 2 diabetes mellitus with diabetic neuropathy, unspecified: Secondary | ICD-10-CM | POA: Insufficient documentation

## 2019-09-19 MED ORDER — INSULIN GLARGINE 100 UNITS/ML SOLOSTAR PEN: 25.0000 [IU] | PEN_INJECTOR | Freq: Every day | SUBCUTANEOUS | 4 refills | Status: DC

## 2019-09-19 NOTE — Assessment & Plan Note (Signed)
Patient with history of Hep C infection recently completed his 12 week medical course of Epclusa and his viral load is undetectable. He was recently on Crestor 10 mg, but has been cleared by infectious disease to continue Crestor 40 mg.   Plan:  -Increase Crestor to 40 mg.

## 2019-09-19 NOTE — Assessment & Plan Note (Signed)
Patient presents with history of diabetes and diabetic neuropathy, coming for refill on his gabapentin.   Plan:  - Refill Gabapentin 300 mg TID

## 2019-09-19 NOTE — Assessment & Plan Note (Signed)
Patient presenting with pressures of 147/72 with a pulse of 66. Patient is on clonidine 0.1 mg BID, Metoprolol 100 mg BID, and amlodipine 10 mg. He states that his palpitations have resolved since taking his medications, and is not experiencing side effects. We discussed additional labs and if he has qualified for the orange card, but he is gathering paperwork.   Assessment:  Patient has improved blood pressure control, but do to not having insurance, we will wait on labs and optimizing his blood pressure medications. To begin, if patient qualifies for the orange card, changing his clonidine to an ACE inhibitor would be ideal considering his diabetes.   Plan:  - Continue current regimen  - Complete orange card application - BMP next visit - Consider switching clonidine to ACE inhibitor if patient qualifies for orange card.

## 2019-09-19 NOTE — Progress Notes (Signed)
   CC: Hypertension   HPI:  [name] is a 54 y.o. with a PMH noted below, who presents to the clinic for a follow up for his hypertension. To see the acute and chronic management of his conditions, please see the separate A&P note under the encounter tab.   Past Medical History:  Diagnosis Date  . Anxiety    Panic Atacks  . Arthritis   . Carpal tunnel syndrome   . Constipation   . Depression   . Diabetes mellitus    DMII  . Dysrhythmia    hx tachy hr  . GERD (gastroesophageal reflux disease)    no problem in past few years.  . Hepatitis   . Hyperlipidemia   . Hypertension   . Neuropathy associated with endocrine disorder ([place])   . Seizures ([place])    "stress related" no more since taking Klonopin 2 years- See Neurologist  [facility]  . Shortness of breath dyspnea    with exertion  . Sleep apnea    used a cpap when he was 350lb-does not need one now 260lb  . Wears contact lenses    Review of Systems:  Review of Systems  Constitutional: Negative for chills, fever and weight loss.  Eyes: Negative for blurred vision, double vision and photophobia.  Respiratory: Negative for cough, hemoptysis and sputum production.   Cardiovascular: Negative for chest pain, palpitations, orthopnea and claudication.  Gastrointestinal: Negative for abdominal pain, constipation, diarrhea, nausea and vomiting.  Neurological: Negative for dizziness, tingling, tremors and headaches.    Physical Exam:  Vitals:   09/18/19 1540  BP: (!) 147/72  Pulse: 66  Temp: 98.6 F (37 C)  TempSrc: Oral  SpO2: 100%  Weight: 276 lb 12.8 oz (125.6 kg)  Height: 5\' 10"  (1.778 m)   Physical Exam Vitals reviewed.  Constitutional:      General: He is not in acute distress.    Appearance: Normal appearance. He is not ill-appearing or toxic-appearing.  HENT:     Head: Normocephalic and atraumatic.  Cardiovascular:     Rate and Rhythm: Normal rate and regular rhythm.     Pulses: Normal pulses.      Heart sounds: Normal heart sounds. No murmur heard.  No friction rub. No gallop.   Pulmonary:     Effort: Pulmonary effort is normal.     Breath sounds: Normal breath sounds. No wheezing, rhonchi or rales.  Abdominal:     General: Bowel sounds are normal.     Palpations: Abdomen is soft.     Tenderness: There is no abdominal tenderness. There is no guarding.  Musculoskeletal:        General: No swelling.     Right lower leg: No edema.     Left lower leg: No edema.  Neurological:     Mental Status: He is alert.     Assessment & Plan:   See Encounters Tab for problem based charting.  Patient discussed with Dr. Heber Denham

## 2019-09-19 NOTE — Assessment & Plan Note (Signed)
Patient with DM on humalog 30u BID and Metformin 1000 mg BID presents to the clinic. States that he is doing well on his medications, but did has had an episode of hypoglycemia where he was feeling "jittery" and his sugar was in the low 60s. He states that he did not eat that morning. But his glucose did improve after drinking juice.   Assessment:  With recent clearance of his Hep C, and his large dose of short acting insulin, we discussed that he may be more sensitive to insulin as his Hep C has cleared, we may lower his dose of insulin. We spoke about discontinuing his Humalog and starting a long acting insulin to which the patient is agreeable. Will readjust medications at next visit in 2 months time.   Plan:  - Start Lantus 25U  - Hold Humalog - Continue Metformin 1000 mg BID

## 2019-09-20 NOTE — Progress Notes (Signed)
Internal Medicine Clinic Attending  Case discussed with Dr. Gilford Rile at the time of the visit.  We reviewed the resident's history and exam and pertinent patient test results.  I agree with the assessment, diagnosis, and plan of care documented in the resident's note.    As noted this patient comes to [place] on an odd regimen of short acting insulin only with Humalog.  And have rather large dose twice a day.  His hypoglycemia appears to be occurring after meals which I suspect is due to being over bolused.  We will however change his regimen drastically by discontinuing the Humalog and starting him on 25 units of Lantus with close follow-up.

## 2019-09-21 ENCOUNTER — Encounter: Payer: Self-pay | Admitting: Internal Medicine

## 2019-10-02 ENCOUNTER — Other Ambulatory Visit: Payer: Self-pay

## 2019-10-02 ENCOUNTER — Encounter: Payer: Self-pay | Admitting: Internal Medicine

## 2019-10-02 ENCOUNTER — Ambulatory Visit (INDEPENDENT_AMBULATORY_CARE_PROVIDER_SITE_OTHER): Payer: Self-pay | Admitting: Internal Medicine

## 2019-10-02 DIAGNOSIS — M7918 Myalgia, other site: Secondary | ICD-10-CM | POA: Insufficient documentation

## 2019-10-02 DIAGNOSIS — F32A Depression, unspecified: Secondary | ICD-10-CM | POA: Insufficient documentation

## 2019-10-02 DIAGNOSIS — F329 Major depressive disorder, single episode, unspecified: Secondary | ICD-10-CM

## 2019-10-02 NOTE — Assessment & Plan Note (Signed)
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 He has tried tylenol, advil, and ibuprofen with little relief.   Assessment:  Patient with complaints of pain after long work periods carrying loads of various sizes. Pain sounds msk in nature. On review of his previous labs, his March 2021 Cr is 1.31, making it less likely to prescribe NSAID. Due to patient's Hx of DM and HTN, will continue have patient come to be evaluated in the clinic. Patient agreeable.   Plan:  - Patient to come to [facility].for evaluation of pain - BMP assess creatinine function.

## 2019-10-02 NOTE — Assessment & Plan Note (Signed)
Patient with a Hx of depression presented with post call PHQ-9 score of 14, quantifying a moderate score. Patient states that he feels depressed, and some days does not feel like getting out of bed. He would appreciate a IBH consultation to help with his depression. Likely 2/2 to lapse of insurance, working multiple side jobs to help pay his monthly bills. Plan:  - Ambulatory referral to [facility]

## 2019-10-02 NOTE — Progress Notes (Signed)
  [facility] Telephone Encounter Continuity Care Appointment  HPI:   This telephone encounter was created for [name] on 10/02/2019 for the following purpose/cc musculoskeletal pain.   Past Medical History:  Past Medical History:  Diagnosis Date  . Anxiety    Panic Atacks  . Arthritis   . Carpal tunnel syndrome   . Constipation   . Depression   . Diabetes mellitus    DMII  . Dysrhythmia    hx tachy hr  . GERD (gastroesophageal reflux disease)    no problem in past few years.  . Hepatitis   . Hyperlipidemia   . Hypertension   . Neuropathy associated with endocrine disorder ([place])   . Seizures ([place])    "stress related" no more since taking Klonopin 2 years- See Neurologist  [facility]  . Shortness of breath dyspnea    with exertion  . Sleep apnea    used a cpap when he was 350lb-does not need one now 260lb  . Wears contact lenses       ROS:  Review of Systems  Constitutional: Negative for chills, fever, malaise/fatigue and weight loss.  Gastrointestinal: Negative for abdominal pain, nausea and vomiting.  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       Assessment / Plan / Recommendations:   Please see A&P under problem oriented charting for assessment of the patient's acute and chronic medical conditions.   As always, pt is advised that if symptoms worsen or new symptoms arise, they should go to an urgent care facility or to to ER for further evaluation.   Consent and Medical Decision Making:   Patient discussed with Dr. [name]  This is a telephone encounter between [place] and Maudie Mercury on 10/02/2019 for Pain management. The visit was conducted with the patient located at home and Maudie Mercury at [facility]. The patient's identity was confirmed using their DOB and current address. The patient has consented  to being evaluated through a telephone encounter and understands the associated risks (an examination cannot be done and the patient may need to come in for an appointment) / benefits (allows the patient to remain at home, decreasing exposure to coronavirus). I personally spent 12 minutes on medical discussion.

## 2019-10-03 ENCOUNTER — Other Ambulatory Visit: Payer: Self-pay

## 2019-10-04 ENCOUNTER — Other Ambulatory Visit: Payer: Self-pay

## 2019-10-04 DIAGNOSIS — E119 Type 2 diabetes mellitus without complications: Secondary | ICD-10-CM

## 2019-10-05 ENCOUNTER — Encounter: Payer: Self-pay | Admitting: Internal Medicine

## 2019-10-05 ENCOUNTER — Encounter (INDEPENDENT_AMBULATORY_CARE_PROVIDER_SITE_OTHER): Payer: Self-pay

## 2019-10-05 LAB — BMP8+ANION GAP
Anion Gap: 15 mmol/L (ref 10.0–18.0)
BUN/Creatinine Ratio: 9 (ref 9–20)
BUN: 14 mg/dL (ref 6–24)
CO2: 22 mmol/L (ref 20–29)
Calcium: 8.9 mg/dL (ref 8.7–10.2)
Chloride: 101 mmol/L (ref 96–106)
Creatinine, Ser: 1.56 mg/dL — ABNORMAL HIGH (ref 0.76–1.27)
GFR calc Af Amer: 59 mL/min/1.73 — ABNORMAL LOW
GFR calc non Af Amer: 51 mL/min/1.73 — ABNORMAL LOW
Glucose: 252 mg/dL — ABNORMAL HIGH (ref 65–99)
Potassium: 4.2 mmol/L (ref 3.5–5.2)
Sodium: 138 mmol/L (ref 134–144)

## 2019-10-05 NOTE — Telephone Encounter (Signed)
Patient call regarding his most recent BMP showing elevated Cr level of approximately 1.5 from 1.3, which was done three months ago. Patient denies any worrying signs and does not need to be seen emergently, but I counseled him getting a follow up appointment in out clinic within the week to further work up his worsening kidney function. He was agreeable stating that the has had family members with ESRD and wants to take this seriously. I will send a message to the [facility] clinic front desk to schedule an appointment for next week.   Marianna Payment, D.O. [facility], PGY-2 Pager: [phone], Phone: [phone] Date 10/05/2019 Time 10:09 AM

## 2019-10-05 NOTE — Telephone Encounter (Signed)
Called patient and will leave a note in chart regarding my discussion.

## 2019-10-05 NOTE — Progress Notes (Signed)
Internal Medicine Clinic Attending  Case discussed with Dr. Winters at the time of the visit.  We reviewed the resident's history and exam and pertinent patient test results.  I agree with the assessment, diagnosis, and plan of care documented in the resident's note.  Seiya Silsby, M.D., Ph.D.  

## 2019-10-10 ENCOUNTER — Telehealth: Payer: Self-pay | Admitting: Licensed Clinical Social Worker

## 2019-10-10 NOTE — Telephone Encounter (Signed)
Patient was called to discuss a referral from his doctor. Patient agreed to schedule an appointment, and will be added to my calendar for 7/20 @ 1:00 via phone.

## 2019-10-13 ENCOUNTER — Other Ambulatory Visit: Payer: Self-pay

## 2019-10-13 ENCOUNTER — Ambulatory Visit (INDEPENDENT_AMBULATORY_CARE_PROVIDER_SITE_OTHER): Payer: Self-pay | Admitting: Internal Medicine

## 2019-10-13 ENCOUNTER — Encounter: Payer: Self-pay | Admitting: Internal Medicine

## 2019-10-13 VITALS — BP 124/73 | HR 58 | Temp 99.3°F | Ht 70.0 in | Wt 276.2 lb

## 2019-10-13 DIAGNOSIS — E119 Type 2 diabetes mellitus without complications: Secondary | ICD-10-CM

## 2019-10-13 DIAGNOSIS — M47816 Spondylosis without myelopathy or radiculopathy, lumbar region: Secondary | ICD-10-CM

## 2019-10-13 DIAGNOSIS — I10 Essential (primary) hypertension: Secondary | ICD-10-CM

## 2019-10-13 MED ORDER — INSULIN GLARGINE 100 UNITS/ML SOLOSTAR PEN: 30.0000 [IU] | PEN_INJECTOR | Freq: Every day | SUBCUTANEOUS | 4 refills | Status: DC

## 2019-10-13 NOTE — Patient Instructions (Addendum)
Thank you, Mr.Deanna E Wacker for allowing [place] to provide your care today. Today we discussed Diabetes, Kidney Function.    I have ordered the following labs for you:  Lab Orders     BMP8+Anion Gap     Microalbumin / Creatinine Urine Ratio     Urinalysis, Reflex Microscopic   I will call if any are abnormal. All of your labs can be accessed through "My Chart".   I have ordered the following medication/changed the following medications:  - No changes  Please follow-up in 1 month.  Should you have any questions or concerns please call the internal medicine clinic at [phone].    Marianna Payment, D.O. [facility]   My Chart Access: [email or web address]   If you have not already done so, please get your COVID 19 vaccine  To schedule an appointment for a COVID vaccine choice any of the following: Go to [email or web address]   Go to [email or web address]                  Call [phone]                                     Call [phone] and select Option 2

## 2019-10-13 NOTE — Progress Notes (Addendum)
CC: Diabetes mellitus  HPI:  [name] a 54 y.o. male with a past medical history stated below and presents today for diabetes. Please see problem based assessment and plan for additional details.  Past Medical History:  Diagnosis Date  . Anxiety    Panic Atacks  . Arthritis   . Carpal tunnel syndrome   . Constipation   . Depression   . Diabetes mellitus    DMII  . Dysrhythmia    hx tachy hr  . GERD (gastroesophageal reflux disease)    no problem in past few years.  . Hepatitis   . Hyperlipidemia   . Hypertension   . Neuropathy associated with endocrine disorder ([place])   . Seizures ([place])    "stress related" no more since taking Klonopin 2 years- See Neurologist  [facility]  . Shortness of breath dyspnea    with exertion  . Sleep apnea    used a cpap when he was 350lb-does not need one now 260lb  . Wears contact lenses     Current Outpatient Medications on File Prior to Visit  Medication Sig Dispense Refill  . amLODipine (NORVASC) 10 MG tablet Take 1 tablet (10 mg total) by mouth daily. 90 tablet 3  . cloNIDine (CATAPRES) 0.1 MG tablet Take 1 tablet (0.1 mg total) by mouth 2 (two) times daily. 120 tablet 3  . cyclobenzaprine (FLEXERIL) 10 MG tablet Take 10 mg by mouth 3 (three) times daily as needed.    . diclofenac (VOLTAREN) 50 MG EC tablet Take 50 mg by mouth 2 (two) times daily.    Marland Kitchen gabapentin (NEURONTIN) 300 MG capsule Take 1 capsule (300 mg total) by mouth 3 (three) times daily. 360 capsule 3  . insulin glargine (LANTUS) 100 unit/mL SOPN Inject 0.25 mLs (25 Units total) into the skin daily. 15 mL 4  . insulin lispro (HUMALOG) 100 UNIT/ML injection Inject 0.3 mLs (30 Units total) into the skin 2 (two) times daily. 10 mL 3  . Insulin Syringe-Needle U-100 (INSULIN SYRINGE .3CC/31GX5/16") 31G X 5/16" 0.3 ML MISC The patient is insulin requiring, ICD 10 code E11.9. The uses insulin 2 times per day. 100 each 3  . metFORMIN (GLUCOPHAGE) 1000 MG tablet Take 1  tablet (1,000 mg total) by mouth 2 (two) times daily with a meal. 180 tablet 3  . metoprolol tartrate (LOPRESSOR) 100 MG tablet Take 1 tablet (100 mg total) by mouth 2 (two) times daily. 120 tablet 3  . rosuvastatin (CRESTOR) 20 MG tablet Take 2 tablets (40 mg total) by mouth daily. 360 tablet 1  . Sofosbuvir-Velpatasvir (EPCLUSA) 400-100 MG TABS Take 1 tablet by mouth daily. 28 tablet 2   No current facility-administered medications on file prior to visit.    Family History  Problem Relation Age of Onset  . Cancer Mother        pancres  . Diabetes Mother   . Hypertension Brother   . Renal cancer Brother   . Cancer Maternal Uncle   . Colon cancer Maternal Uncle   . Colon cancer Maternal Uncle   . Stomach cancer Maternal Uncle   . Esophageal cancer Neg Hx   . Rectal cancer Neg Hx     Social History   Socioeconomic History  . Marital status: Single    Spouse name: Not on file  . Number of children: Not on file  . Years of education: Not on file  . Highest education level: Not on file  Occupational History  .  Not on file  Tobacco Use  . Smoking status: Former Smoker    Quit date: 10/27/2008    Years since quitting: 10.9  . Smokeless tobacco: Never Used  . Tobacco comment: Quit x 10 yrs.  Vaping Use  . Vaping Use: Never used  Substance and Sexual Activity  . Alcohol use: Yes    Comment: Beer sometimes.  . Drug use: No    Types: Marijuana  . Sexual activity: Never  Other Topics Concern  . Not on file  Social History Narrative  . Not on file   Social Determinants of Health   Financial Resource Strain:   . Difficulty of Paying Living Expenses:   Food Insecurity:   . Worried About Charity fundraiser in the Last Year:   . Arboriculturist in the Last Year:   Transportation Needs:   . Film/video editor (Medical):   Marland Kitchen Lack of Transportation (Non-Medical):   Physical Activity:   . Days of Exercise per Week:   . Minutes of Exercise per Session:   Stress:   .  Feeling of Stress :   Social Connections:   . Frequency of Communication with Friends and Family:   . Frequency of Social Gatherings with Friends and Family:   . Attends Religious Services:   . Active Member of Clubs or Organizations:   . Attends Archivist Meetings:   Marland Kitchen Marital Status:   Intimate Partner Violence:   . Fear of Current or Ex-Partner:   . Emotionally Abused:   Marland Kitchen Physically Abused:   . Sexually Abused:     Review of Systems: ROS negative except for what is noted on the assessment and plan.  Vitals:   10/13/19 1005 10/13/19 1006  BP:  124/73  Pulse:  (!) 58  Temp:  99.3 F (37.4 C)  TempSrc:  Oral  SpO2:  100%  Weight: 276 lb 3.2 oz (125.3 kg)   Height: 5\' 10"  (1.778 m)      Physical Exam: Physical Exam Constitutional:      Appearance: Normal appearance.  HENT:     Head: Normocephalic and atraumatic.  Eyes:     Extraocular Movements: Extraocular movements intact.  Cardiovascular:     Rate and Rhythm: Normal rate.     Pulses: Normal pulses.     Heart sounds: Normal heart sounds.  Pulmonary:     Effort: Pulmonary effort is normal.     Breath sounds: Normal breath sounds.  Abdominal:     General: Bowel sounds are normal.     Palpations: Abdomen is soft.     Tenderness: There is no abdominal tenderness.  Musculoskeletal:        General: Normal range of motion.     Cervical back: Normal range of motion.     Right lower leg: No edema.     Left lower leg: No edema.  Skin:    General: Skin is warm and dry.  Neurological:     Mental Status: He is alert and oriented to person, place, and time. Mental status is at baseline.  Psychiatric:        Mood and Affect: Mood normal.      Assessment & Plan:   See Encounters Tab for problem based charting.  Patient discussed with Dr. Dr. Hilbert Odor, D.O. [facility], PGY-2 Pager: [phone], Phone: [phone] Date 10/13/2019 Time 10:16 AM

## 2019-10-14 ENCOUNTER — Encounter: Payer: Self-pay | Admitting: Internal Medicine

## 2019-10-14 LAB — URINALYSIS, ROUTINE W REFLEX MICROSCOPIC
Bilirubin, UA: NEGATIVE
Glucose, UA: NEGATIVE
Ketones, UA: NEGATIVE
Leukocytes,UA: NEGATIVE
Nitrite, UA: NEGATIVE
Specific Gravity, UA: 1.018 (ref 1.005–1.030)
Urobilinogen, Ur: 1 mg/dL (ref 0.2–1.0)
pH, UA: 6 (ref 5.0–7.5)

## 2019-10-14 LAB — BMP8+ANION GAP
Anion Gap: 14 mmol/L (ref 10.0–18.0)
BUN/Creatinine Ratio: 15 (ref 9–20)
BUN: 19 mg/dL (ref 6–24)
CO2: 23 mmol/L (ref 20–29)
Calcium: 9.3 mg/dL (ref 8.7–10.2)
Chloride: 101 mmol/L (ref 96–106)
Creatinine, Ser: 1.29 mg/dL — ABNORMAL HIGH (ref 0.76–1.27)
GFR calc Af Amer: 73 mL/min/{1.73_m2} (ref >59)
GFR calc non Af Amer: 63 mL/min/{1.73_m2} (ref >59)
Glucose: 115 mg/dL — ABNORMAL HIGH (ref 65–99)
Potassium: 3.9 mmol/L (ref 3.5–5.2)
Sodium: 138 mmol/L (ref 134–144)

## 2019-10-14 LAB — MICROALBUMIN / CREATININE URINE RATIO
Creatinine, Urine: 96.5 mg/dL
Microalb/Creat Ratio: 3449 mg/g creat — ABNORMAL HIGH (ref 0–29)
Microalbumin, Urine: 3328.4 ug/mL

## 2019-10-14 LAB — MICROSCOPIC EXAMINATION
Bacteria, UA: NONE SEEN
Casts: NONE SEEN /lpf
RBC, Urine: NONE SEEN /hpf (ref 0–2)
WBC, UA: NONE SEEN /hpf (ref 0–5)

## 2019-10-14 NOTE — Assessment & Plan Note (Addendum)
Patient presents for a follow up appointment for his diabetes. During his last visit he was found to have a A1c of 12.9 with an elevation in his Cr in the last 5 months form 1.10 > 1.31 > 1.56. The only changes in his medications are that he is no longer taking diclofenac and that he just completed his course of antiviral therapy for HCV. He admits to not having his insulin for several weeks leading up to his last A1c check but it is diffcult to know when this occurred, as he expressed symptoms of hypoglycemia at his last clinic visit, and was subsequently taken off short acting insulin. He is recently started back on Lantus 30 units daily and continues to take metformin 1000 mg daily. He check his blood sugar several times a day and states that it has been running around 160-180. He denies any recent symptoms of low blood sugar since being taken off of humolog at his last visit. I will recheck his kidney function today and put in UA and microalbumin labs to see if he he is having proteinuria.   Plan: - Ordred BMP, Microalbumin/reatinine ratio, UA - Follow up in 1 month for A1c recheck. - Will likely need titration of his insulin and possible 2 week CGM to improve his DM managment.    **ADDENDUM**  BMP shows improvement of kidney function with Cr of 1.29 and GFR of 73.

## 2019-10-16 ENCOUNTER — Telehealth: Payer: Self-pay | Admitting: Internal Medicine

## 2019-10-16 DIAGNOSIS — M47816 Spondylosis without myelopathy or radiculopathy, lumbar region: Secondary | ICD-10-CM

## 2019-10-16 MED FILL — METOPROLOL TARTRATE 100 MG: 100 | 30 days supply | Qty: 60 | Fill #2

## 2019-10-16 MED FILL — CloNIDine HCL 0.1 MG TAB: 0.1 | 30 days supply | Qty: 60 | Fill #2

## 2019-10-16 MED FILL — AMLODIPINE BESYLATE 10 MG T: 10 | 30 days supply | Qty: 30 | Fill #2

## 2019-10-16 MED FILL — GABAPENTIN 300 MG CAPSULE: 300 | 30 days supply | Qty: 90 | Fill #1

## 2019-10-16 NOTE — Progress Notes (Signed)
Internal Medicine Clinic Attending  Case discussed with Dr. Marianna Payment  at the time of the visit.  We reviewed the resident's history and exam and pertinent patient test results.  I agree with the assessment, diagnosis, and plan of care documented in the resident's note.  Lab results reveal significant proteinuria, for which ongoing effort to control hyperglycemia and hypertension will continue, along with addition of RAS inhibitor.  He has recently resumed his maintenance medications.

## 2019-10-16 NOTE — Telephone Encounter (Signed)
Called patient regarding his most recent message regarding lumbar back pain with sciatica. I counseled him on the use of pain medication in the setting of kidney and liver disease. We discussed using OTC lidocaine patches to hold him over while doing the prior authorization for prescription strength patches. He agrees with this plan.   I discussed his microalbumin results and recommended starting low dose lisinopril. His most recent blood pressure was well controlled at 124/73 so he would likely need to decrease one of his other medication prior to starting lisinopril. He admits to understanding.   Marianna Payment, D.O. [facility], PGY-2 Pager: [phone], Phone: [phone] Date 10/16/2019 Time 5:34 PM

## 2019-10-17 ENCOUNTER — Telehealth: Payer: Self-pay | Admitting: *Deleted

## 2019-10-17 MED ORDER — LISINOPRIL 10 MG PO TABS: 10.0000 mg | ORAL_TABLET | Freq: Every day | ORAL | 2 refills | Status: DC

## 2019-10-17 MED ORDER — LIDOCAINE 5 % EX PTCH: 1.0000 | MEDICATED_PATCH | Freq: Two times a day (BID) | CUTANEOUS | 0 refills | Status: DC

## 2019-10-17 MED ORDER — LIDOCAINE 5 % EX PTCH: 1.0000 | MEDICATED_PATCH | Freq: Two times a day (BID) | CUTANEOUS | 0 refills | Status: AC

## 2019-10-17 MED ORDER — CLONIDINE HCL 0.1 MG PO TABS: 0.1000 mg | ORAL_TABLET | Freq: Every day | ORAL | 3 refills | Status: DC

## 2019-10-17 MED FILL — LISINOPRIL 10 MG TABS: 10 | 30 days supply | Qty: 30 | Fill #0

## 2019-10-17 MED FILL — metFORMIN HCL 1000 MG TABS: 1000 | 30 days supply | Qty: 60 | Fill #2

## 2019-10-17 NOTE — Telephone Encounter (Signed)
[place]

## 2019-10-17 NOTE — Addendum Note (Signed)
Addended by: Lawerance Cruel on: 10/17/2019 04:04 PM   Modules accepted: Orders

## 2019-10-17 NOTE — Assessment & Plan Note (Signed)
Patient has significant back pain and is unable to tolerate NSAIDs due to worsening kidney function and will not tolerate acetaminophen due to history of chronic HCV with compensated cirrhosis.   Plan: - Lidocaine 5% patches BID

## 2019-10-17 NOTE — Assessment & Plan Note (Addendum)
**  ADDENDEUM**  Patient was found to have significant proteinuria and will be started on lisinopril 10 mg daily. I will titrate his clonidine off, starting with a dose reduction of 0.1 then wait a week and stop taking this medication,.

## 2019-10-23 ENCOUNTER — Ambulatory Visit: Payer: Self-pay

## 2019-10-23 MED FILL — ROSUVASTATIN CALCIUM 20 MG: 20 | 30 days supply | Qty: 60 | Fill #1

## 2019-10-23 NOTE — Progress Notes (Signed)
I have reviewed the Infectious Disease Clinical Pharmacist's note. I agree with the assessment and plan as outlined by Cassie.

## 2019-10-24 ENCOUNTER — Other Ambulatory Visit: Payer: Self-pay

## 2019-10-24 ENCOUNTER — Ambulatory Visit (INDEPENDENT_AMBULATORY_CARE_PROVIDER_SITE_OTHER): Payer: Self-pay | Admitting: Licensed Clinical Social Worker

## 2019-10-24 ENCOUNTER — Encounter: Payer: Self-pay | Admitting: Licensed Clinical Social Worker

## 2019-10-24 DIAGNOSIS — F329 Major depressive disorder, single episode, unspecified: Secondary | ICD-10-CM

## 2019-10-24 DIAGNOSIS — F32A Depression, unspecified: Secondary | ICD-10-CM

## 2019-10-24 NOTE — BH Specialist Note (Signed)
Integrated Behavioral Health Visit via Telemedicine (Telephone)  10/24/2019 [name] [id number]   Session Start time: 1:00  Session End time: 1:30 Total time: 30 minutes  Referring Provider: Dr.Winters Type of Visit: Telephonic Patient location: Home [facility] Provider location: Office All persons participating in visit: Patient and [facility]  Confirmed patient's address: Yes  Confirmed patient's phone number: Yes  Any changes to demographics: No   Discussed confidentiality: Yes    The following statements were read to the patient and/or legal guardian that are established with the [facility] Provider.  "The purpose of this phone visit is to provide behavioral health care while limiting exposure to the coronavirus (COVID19).  There is a possibility of technology failure and discussed alternative modes of communication if that failure occurs."  "By engaging in this telephone visit, you consent to the provision of healthcare.  Additionally, you authorize for your insurance to be billed for the services provided during this telephone visit."   Patient and/or legal guardian consented to telephone visit: Yes   PRESENTING CONCERNS: Patient and/or family reports the following symptoms/concerns: physical pain, depression, anxiety, and financial challenges. Duration of problem: around one year ago; Severity of problem: moderate  GOALS ADDRESSED: Patient will: 1.  Reduce symptoms of: anxiety, depression and stress  2.  Increase knowledge and/or ability of: coping skills, healthy habits and stress reduction  3.  Demonstrate ability to: Increase healthy adjustment to current life circumstances and Increase adequate support systems for patient/family  INTERVENTIONS: Interventions utilized:  Motivational Interviewing, Brief CBT and Supportive Counseling Standardized Assessments completed: assessed for SI, HI, and self-harm.  ASSESSMENT: Patient currently experiencing moderate levels  of depression and anxiety. Patient reported he developed depression after issues with his feet and health. Patient is unable to work now due to his feet. Patient reported challenges getting out of the bed and isolation. Patient reported that he did attempt suicide when he was 62, but currently has not thoughts of SI, HI, and self-harm.    Patient has fallen over the past year, and he is scared of falling again. Patient lives alone. Patient has filed for disability. Patient is worried about how he will financially pay his rent. Patient was provided [employer], and encouraged to complete an application.   Patient may benefit from counseling.  PLAN: 1. Follow up with behavioral health clinician on : three weeks.   Dessie Coma, [facility], [place]

## 2019-11-10 ENCOUNTER — Encounter: Payer: Self-pay | Admitting: Internal Medicine

## 2019-11-10 ENCOUNTER — Other Ambulatory Visit: Payer: Self-pay

## 2019-11-10 ENCOUNTER — Ambulatory Visit (INDEPENDENT_AMBULATORY_CARE_PROVIDER_SITE_OTHER): Payer: Self-pay | Admitting: Internal Medicine

## 2019-11-10 VITALS — BP 124/74 | HR 65 | Temp 99.5°F | Ht 70.0 in | Wt 275.3 lb

## 2019-11-10 DIAGNOSIS — I10 Essential (primary) hypertension: Secondary | ICD-10-CM

## 2019-11-10 DIAGNOSIS — E119 Type 2 diabetes mellitus without complications: Secondary | ICD-10-CM

## 2019-11-10 DIAGNOSIS — F329 Major depressive disorder, single episode, unspecified: Secondary | ICD-10-CM

## 2019-11-10 LAB — HM DIABETES EYE EXAM

## 2019-11-10 MED ORDER — DULOXETINE HCL 30 MG PO CPEP
ORAL_CAPSULE | ORAL | 1 refills | Status: DC
Start: 2019-11-10 — End: 2019-12-06

## 2019-11-10 MED ORDER — DULOXETINE HCL 30 MG PO CPEP
ORAL_CAPSULE | ORAL | 1 refills | Status: DC
Start: 2019-11-10 — End: 2019-11-10

## 2019-11-10 MED FILL — DULoxetine HCL 30 MG CPEP: 30 | 30 days supply | Qty: 60 | Fill #0

## 2019-11-10 NOTE — Assessment & Plan Note (Signed)
Patient reports that he has history of depression and had previously been on medication years ago.  He states that he has been having decreased interest, feeling more depressed, trouble falling asleep, feeling tired, poor appetite, and feeling down about himself.  He denies any current suicidal ideations.  He does endorse a suicidal attempt at age 54 and however states that this may have been due to trying to get more attention.  His PHQ-9 score is 21.  He has been meeting with Ms. Ishmael Holter which has been helping.  He is open to starting any medication.  Does have a history of polyneuropathy and had been on Cymbalta in the past.  We discussed restarting this medication and he is agreeable.  -Start Cymbalta -RTC in 1 month -Continue following with Ms. Ishmael Holter

## 2019-11-10 NOTE — Assessment & Plan Note (Addendum)
Patient is currently on Metformin 1000 mg twice daily, Lantus 30 units nightly.  He does not have his meter with him however states that his CBGs have been around mid 100s, the one this morning was 119.  He did have an episode of hypoglycemia a few nights ago when he took his old insulin which was a short acting insulin, he had woken up feeling very sweaty and noted to have a CBG of 50, he went and ate some food and his CBGs improved.  He has not had this happen when he takes the Lantus.  He denies any other issues.  He urinates about 3 times per night, this is down from about 10 times per night previously.  He also urinates about 3 times per day, is trying to drink more water to stay well-hydrated.  His last A1c was 12.9.  Overall he appears to be doing well when he takes the Lantus, advised to discard his old insulin.  -Continue Metformin 1000 mg twice daily -Continue Lantus 30 units daily -Repeat A1c next visit -Eye exam done today

## 2019-11-10 NOTE — Assessment & Plan Note (Signed)
Patient is currently on lisinopril 10 mg daily, metoprolol 100 mg twice daily and amlodipine 10 mg daily.  He denies any issues taking this medication.  He was started on the lisinopril on his last visit due to his proteinuria.  Blood pressure today is well controlled at 124/74.  No changes at this time.  We will repeat BMP today.  -Continue lisinopril 10 mg daily -Continue metoprolol 100 mg twice daily -Continue amlodipine 10 mg daily -BMP

## 2019-11-10 NOTE — Progress Notes (Signed)
   CC: Hypertension, diabetes, depression  HPI:  [name] a 54 y.o. with a history listed below presenting for follow-up of his hypertension and diabetes, noted to have worsening depression.  Past Medical History:  Diagnosis Date  . Anxiety    Panic Atacks  . Arthritis   . Carpal tunnel syndrome   . Constipation   . Depression   . Diabetes mellitus    DMII  . Dysrhythmia    hx tachy hr  . GERD (gastroesophageal reflux disease)    no problem in past few years.  . Hepatitis   . Hyperlipidemia   . Hypertension   . Neuropathy associated with endocrine disorder ([place])   . Seizures ([place])    "stress related" no more since taking Klonopin 2 years- See Neurologist  [facility]  . Shortness of breath dyspnea    with exertion  . Sleep apnea    used a cpap when he was 350lb-does not need one now 260lb  . Wears contact lenses    Review of Systems:   Constitutional: Negative for chills and fever.  Respiratory: Negative for shortness of breath.   Cardiovascular: Negative for chest pain and leg swelling.  Gastrointestinal: Negative for abdominal pain, nausea and vomiting.  Neurological: Negative for dizziness and headaches.  Psych: Positive for depression.   Physical Exam:  Vitals:   11/10/19 1339  BP: 124/74  Pulse: 65  Temp: 99.5 F (37.5 C)  TempSrc: Oral  SpO2: 100%  Weight: 275 lb 4.8 oz (124.9 kg)  Height: 5\' 10"  (1.778 m)   Physical Exam Cardiovascular:     Rate and Rhythm: Normal rate and regular rhythm.     Pulses: Normal pulses.     Heart sounds: Normal heart sounds.  Pulmonary:     Effort: Pulmonary effort is normal.     Breath sounds: Normal breath sounds.  Abdominal:     General: Abdomen is flat. Bowel sounds are normal.     Palpations: Abdomen is soft.  Skin:    Capillary Refill: Capillary refill takes less than 2 seconds.  Neurological:     General: No focal deficit present.     Mental Status: He is oriented to person, place, and time.   Psychiatric:        Mood and Affect: Mood normal.        Behavior: Behavior normal.      Assessment & Plan:   See Encounters Tab for problem based charting.  Patient discussed with Dr. Dareen Piano

## 2019-11-10 NOTE — Patient Instructions (Addendum)
[name],  It was a pleasure to see you today. Thank you for coming in.   Today we discussed your diabetes. In regards to this please continue taking the Lantus and metformin. Please continue checking your blood sugars at home. Bring in your meter on your next visit.    We also discussed your blood pressure. This looks good today. Continue taking your current medications. We are checking some labs and will contact you if they are abnormal.    We also dicussed your depression. We started you on a medication called Duloxetine. Please start taking 30 mg daily (1 tablet) for 1 week, then increase it to 60 mg daily (2 tablets) after that. Please continue to follow up with [name].    Please return to clinic in 1 month or sooner if needed.   Thank you again for coming in.   [name].D.

## 2019-11-11 LAB — BMP8+ANION GAP
Anion Gap: 14 mmol/L (ref 10.0–18.0)
BUN/Creatinine Ratio: 14 (ref 9–20)
BUN: 21 mg/dL (ref 6–24)
CO2: 24 mmol/L (ref 20–29)
Calcium: 10 mg/dL (ref 8.7–10.2)
Chloride: 102 mmol/L (ref 96–106)
Creatinine, Ser: 1.54 mg/dL — ABNORMAL HIGH (ref 0.76–1.27)
GFR calc Af Amer: 59 mL/min/{1.73_m2} — ABNORMAL LOW (ref >59)
GFR calc non Af Amer: 51 mL/min/{1.73_m2} — ABNORMAL LOW (ref >59)
Glucose: 117 mg/dL — ABNORMAL HIGH (ref 65–99)
Potassium: 4.8 mmol/L (ref 3.5–5.2)
Sodium: 140 mmol/L (ref 134–144)

## 2019-11-15 ENCOUNTER — Other Ambulatory Visit: Payer: Self-pay

## 2019-11-15 ENCOUNTER — Encounter: Payer: Self-pay | Admitting: Licensed Clinical Social Worker

## 2019-11-15 ENCOUNTER — Ambulatory Visit (INDEPENDENT_AMBULATORY_CARE_PROVIDER_SITE_OTHER): Payer: Self-pay | Admitting: Licensed Clinical Social Worker

## 2019-11-15 DIAGNOSIS — F329 Major depressive disorder, single episode, unspecified: Secondary | ICD-10-CM

## 2019-11-15 DIAGNOSIS — F32A Depression, unspecified: Secondary | ICD-10-CM

## 2019-11-15 NOTE — BH Specialist Note (Signed)
Integrated Behavioral Health Visit via Telemedicine (Telephone)  11/15/2019 [name] [id number]   Session Start time: 12:30  Session End time: 1:00 Total time: 30 minutes  Referring Provider: Dr. Gilford Rile Type of Visit: Telephonic Patient location: Home [facility] Provider location: Remote All persons participating in visit: Patient and [facility]  Confirmed patient's address: Yes  Confirmed patient's phone number: Yes  Any changes to demographics: No   The following statements were read to the patient and/or legal guardian that are established with the [facility] Provider.  "The purpose of this phone visit is to provide behavioral health care while limiting exposure to the coronavirus (COVID19).  There is a possibility of technology failure and discussed alternative modes of communication if that failure occurs."  Discussed confidentiality: Yes   "By engaging in this telephone visit, you consent to the provision of healthcare.  Additionally, you authorize for your insurance to be billed for the services provided during this telephone visit."   Patient and/or legal guardian consented to telephone visit: Yes   PRESENTING CONCERNS: Patient and/or family reports the following symptoms/concerns:  physical pain, depression, anxiety, and financial challenges. Duration of problem: around one year ago; Severity of problem: moderate  GOALS ADDRESSED: Patient will: 1.  Reduce symptoms of: anxiety, depression and stress  2.  Increase knowledge and/or ability of: coping skills, healthy habits and stress reduction  3.  Demonstrate ability to: Increase healthy adjustment to current life circumstances, Increase adequate support systems for patient/family and Increase motivation to adhere to plan of care  INTERVENTIONS: Interventions utilized:  Motivational Interviewing, Mindfulness or Relaxation Training and Supportive Counseling Standardized Assessments completed: Not  Needed  ASSESSMENT: Patient currently experiencing mild to moderate levels of depression. Patient reported he applied for the "hope project" to assist with housing. Patient was provided another resource for housing to apply for. Patient reported that he has been having stomach pains (no identified cause). Patient is continuing to isolate and lay in his bed due to depression.  Patient has forced himself to cook and try to do activities out of his norm.   Patient continues to have pain in his feet, and fears that he will fall.   Patient processed that he is trying to be present. Patient reported that he "needs to be needed", and since he has not been able to work he cannot fill that void.   Patient may benefit from counseling and begin taking Cymbalta.Marland Kitchen  PLAN: 1. Follow up with behavioral health clinician on : one month. Dessie Coma, [facility], [place]

## 2019-11-16 MED FILL — GABAPENTIN 300 MG CAPSULE: 300 | 30 days supply | Qty: 90 | Fill #2

## 2019-11-16 MED FILL — metFORMIN HCL 1000 MG TABS: 1000 | 30 days supply | Qty: 60 | Fill #3

## 2019-11-16 MED FILL — AMLODIPINE BESYLATE 10 MG T: 10 | 30 days supply | Qty: 30 | Fill #3

## 2019-11-16 MED FILL — METOPROLOL TARTRATE 100 MG: 100 | 30 days supply | Qty: 60 | Fill #3

## 2019-11-16 MED FILL — LISINOPRIL 10 MG TABS: 10 | 30 days supply | Qty: 30 | Fill #1

## 2019-11-17 NOTE — Progress Notes (Signed)
Internal Medicine Clinic Attending ° °Case discussed with Dr. Krienke  At the time of the visit.  We reviewed the resident’s history and exam and pertinent patient test results.  I agree with the assessment, diagnosis, and plan of care documented in the resident’s note.  °

## 2019-11-20 ENCOUNTER — Telehealth: Payer: Self-pay | Admitting: Internal Medicine

## 2019-11-20 NOTE — Telephone Encounter (Signed)
Entered in error

## 2019-11-21 ENCOUNTER — Ambulatory Visit (INDEPENDENT_AMBULATORY_CARE_PROVIDER_SITE_OTHER): Payer: Self-pay | Admitting: Pharmacist

## 2019-11-21 ENCOUNTER — Other Ambulatory Visit: Payer: Self-pay

## 2019-11-21 ENCOUNTER — Encounter (INDEPENDENT_AMBULATORY_CARE_PROVIDER_SITE_OTHER): Payer: Self-pay | Admitting: Dietician

## 2019-11-21 DIAGNOSIS — B182 Chronic viral hepatitis C: Secondary | ICD-10-CM

## 2019-11-21 DIAGNOSIS — K7400 Hepatic fibrosis, unspecified: Secondary | ICD-10-CM

## 2019-11-21 DIAGNOSIS — E119 Type 2 diabetes mellitus without complications: Secondary | ICD-10-CM

## 2019-11-21 NOTE — Progress Notes (Signed)
HPI: [name] is a 54 y.o. male who presents to the [facility] for Hepatitis C follow-up.  Medication:Epclusa x 12 weeks  Start Date:06/02/19  Hepatitis C Genotype:1a  Fibrosis Score:non-cirrhotic  Hepatitis C RNA:16.5 million on 05/10/19; <15 on 07/05/19 and 08/29/19  Patient Active Problem List   Diagnosis Date Noted  . Musculoskeletal pain 10/02/2019  . Depression   . Diabetic neuropathy ([place]) 09/19/2019  . Underinsured 05/15/2019  . Hyperlipidemia 05/10/2019  . Chronic hepatitis C ([place]) 05/10/2019  . Chronic cough 05/10/2019  . Abnormal CT scan of lung 05/10/2019  . Diabetes ([place]) 01/29/2016  . Essential hypertension 01/29/2016  . Lumbar facet arthropathy 08/29/2014    Patient's Medications  New Prescriptions   No medications on file  Previous Medications   AMLODIPINE (NORVASC) 10 MG TABLET    Take 1 tablet (10 mg total) by mouth daily.   CYCLOBENZAPRINE (FLEXERIL) 10 MG TABLET    Take 10 mg by mouth 3 (three) times daily as needed.   DULOXETINE (CYMBALTA) 30 MG CAPSULE    Take 1 capsule (30 mg total) by mouth daily for 7 days, THEN 2 capsules (60 mg total) daily.   GABAPENTIN (NEURONTIN) 300 MG CAPSULE    Take 1 capsule (300 mg total) by mouth 3 (three) times daily.   INSULIN GLARGINE (LANTUS) 100 UNIT/ML SOPN    Inject 0.3 mLs (30 Units total) into the skin daily.   INSULIN SYRINGE-NEEDLE U-100 (INSULIN SYRINGE .3CC/31GX5/16") 31G X 5/16" 0.3 ML MISC    The patient is insulin requiring, ICD 10 code E11.9. The uses insulin 2 times per day.   LISINOPRIL (ZESTRIL) 10 MG TABLET    Take 1 tablet (10 mg total) by mouth daily.   METFORMIN (GLUCOPHAGE) 1000 MG TABLET    Take 1 tablet (1,000 mg total) by mouth 2 (two) times daily with a meal.   METOPROLOL TARTRATE (LOPRESSOR) 100 MG TABLET    Take 1 tablet (100 mg total) by mouth 2 (two) times daily.   ROSUVASTATIN (CRESTOR) 20 MG TABLET    Take 2 tablets (40 mg total) by mouth daily.  Modified  Medications   No medications on file  Discontinued Medications   SOFOSBUVIR-VELPATASVIR (EPCLUSA) 400-100 MG TABS    Take 1 tablet by mouth daily.    Allergies: No Known Allergies  Past Medical History: Past Medical History:  Diagnosis Date  . Anxiety    Panic Atacks  . Arthritis   . Carpal tunnel syndrome   . Constipation   . Depression   . Diabetes mellitus    DMII  . Dysrhythmia    hx tachy hr  . GERD (gastroesophageal reflux disease)    no problem in past few years.  . Hepatitis   . Hyperlipidemia   . Hypertension   . Neuropathy associated with endocrine disorder ([place])   . Seizures ([place])    "stress related" no more since taking Klonopin 2 years- See Neurologist  [facility]  . Shortness of breath dyspnea    with exertion  . Sleep apnea    used a cpap when he was 350lb-does not need one now 260lb  . Wears contact lenses     Social History: Social History   Socioeconomic History  . Marital status: Single    Spouse name: Not on file  . Number of children: Not on file  . Years of education: Not on file  . Highest education level: Not on file  Occupational History  . Not  on file  Tobacco Use  . Smoking status: Former Smoker    Quit date: 10/27/2008    Years since quitting: 11.0  . Smokeless tobacco: Never Used  . Tobacco comment: Quit x 10 yrs.  Vaping Use  . Vaping Use: Never used  Substance and Sexual Activity  . Alcohol use: Yes    Comment: Beer sometimes.  . Drug use: No    Types: Marijuana  . Sexual activity: Never  Other Topics Concern  . Not on file  Social History Narrative  . Not on file   Social Determinants of Health   Financial Resource Strain:   . Difficulty of Paying Living Expenses:   Food Insecurity:   . Worried About Charity fundraiser in the Last Year:   . Arboriculturist in the Last Year:   Transportation Needs:   . Film/video editor (Medical):   Marland Kitchen Lack of Transportation (Non-Medical):   Physical Activity:   . Days  of Exercise per Week:   . Minutes of Exercise per Session:   Stress:   . Feeling of Stress :   Social Connections:   . Frequency of Communication with Friends and Family:   . Frequency of Social Gatherings with Friends and Family:   . Attends Religious Services:   . Active Member of Clubs or Organizations:   . Attends Archivist Meetings:   Marland Kitchen Marital Status:     Labs: Hepatitis C Lab Results  Component Value Date   HCVGENOTYPE 1a 05/16/2019   HCVRNAPCRQN <15 NOT DETECTED 08/29/2019   HCVRNAPCRQN <15 NOT DETECTED 07/05/2019   Hepatitis B Lab Results  Component Value Date   HEPBSAB NON-REACTIVE 05/16/2019   Hepatitis A No results found for: HAV HIV No results found for: HIV Lab Results  Component Value Date   CREATININE 1.54 (H) 11/10/2019   CREATININE 1.29 (H) 10/13/2019   CREATININE 1.56 (H) 10/04/2019   CREATININE 1.31 07/05/2019   CREATININE 1.10 05/10/2019   Lab Results  Component Value Date   AST 25 07/05/2019   AST 54 (H) 05/10/2019   AST 130 (H) 01/29/2016   ALT 20 07/05/2019   ALT 57 (H) 05/16/2019   ALT 63 (H) 05/10/2019   INR 1.0 05/16/2019    Assessment: [name] is here today for his SVR12 Hepatitis C cure visit. He completed 12 weeks of Epclusa back in May and had an undetectable HCV RNA at the end of treatment. Discussed reinfection risk and that his Hepatitis C antibody will always be positive for lifelong. Will check final labs today and call him with the results.   Plan: - Hepatitis C RNA today for cure  [name] L. Saydi Kobel, PharmD, BCIDP, AAHIVP, CPP Clinical Pharmacist Practitioner [facility] 11/21/2019, 4:30 PM

## 2019-11-23 ENCOUNTER — Other Ambulatory Visit: Payer: Self-pay | Admitting: Internal Medicine

## 2019-11-23 DIAGNOSIS — E119 Type 2 diabetes mellitus without complications: Secondary | ICD-10-CM

## 2019-11-23 LAB — COMPREHENSIVE METABOLIC PANEL
AG Ratio: 1 (calc) (ref 1.0–2.5)
ALT: 23 U/L (ref 9–46)
AST: 26 U/L (ref 10–35)
Albumin: 3.7 g/dL (ref 3.6–5.1)
Alkaline phosphatase (APISO): 68 U/L (ref 35–144)
BUN: 25 mg/dL (ref 7–25)
CO2: 29 mmol/L (ref 20–32)
Calcium: 9.3 mg/dL (ref 8.6–10.3)
Chloride: 100 mmol/L (ref 98–110)
Creat: 1.31 mg/dL (ref 0.70–1.33)
Globulin: 3.6 g/dL (calc) (ref 1.9–3.7)
Glucose, Bld: 214 mg/dL — ABNORMAL HIGH (ref 65–99)
Potassium: 4.6 mmol/L (ref 3.5–5.3)
Sodium: 135 mmol/L (ref 135–146)
Total Bilirubin: 0.4 mg/dL (ref 0.2–1.2)
Total Protein: 7.3 g/dL (ref 6.1–8.1)

## 2019-11-23 LAB — HEPATITIS C RNA QUANTITATIVE
HCV RNA, PCR, QN (Log): <1.18 log IU/mL
HCV RNA, PCR, QN: <15 IU/mL

## 2019-11-24 ENCOUNTER — Encounter: Payer: Self-pay | Admitting: Pharmacist

## 2019-12-06 ENCOUNTER — Ambulatory Visit (INDEPENDENT_AMBULATORY_CARE_PROVIDER_SITE_OTHER): Payer: Self-pay | Admitting: Internal Medicine

## 2019-12-06 ENCOUNTER — Encounter: Payer: Self-pay | Admitting: Internal Medicine

## 2019-12-06 ENCOUNTER — Other Ambulatory Visit: Payer: Self-pay | Admitting: Internal Medicine

## 2019-12-06 ENCOUNTER — Other Ambulatory Visit: Payer: Self-pay

## 2019-12-06 VITALS — BP 130/84 | HR 57 | Temp 98.9°F | Ht 70.0 in | Wt 269.6 lb

## 2019-12-06 DIAGNOSIS — F32A Depression, unspecified: Secondary | ICD-10-CM

## 2019-12-06 DIAGNOSIS — Z23 Encounter for immunization: Secondary | ICD-10-CM

## 2019-12-06 DIAGNOSIS — F329 Major depressive disorder, single episode, unspecified: Secondary | ICD-10-CM

## 2019-12-06 DIAGNOSIS — E119 Type 2 diabetes mellitus without complications: Secondary | ICD-10-CM

## 2019-12-06 LAB — POCT GLYCOSYLATED HEMOGLOBIN (HGB A1C): Hemoglobin A1C: 8.9 % — AB (ref 4.0–5.6)

## 2019-12-06 LAB — GLUCOSE, CAPILLARY: Glucose-Capillary: 133 mg/dL — ABNORMAL HIGH (ref 70–99)

## 2019-12-06 MED ORDER — DULOXETINE HCL 60 MG PO CPEP: 60.0000 mg | ORAL_CAPSULE | Freq: Every day | ORAL | 2 refills | Status: DC

## 2019-12-06 NOTE — Patient Instructions (Addendum)
[name],  It was a pleasure to see you again today. Today we discussed your diabetes, hypertension, and depression. Please continue taking your diabetes medications and hypertension medications. We will double your dose of Cymbalta from 30 mg (1 pill) to 60 mg (2 pills). I will also send this medication to your pharmacy. Continue to follow up with behavioral health on 12/12/2019. I will see you in three months time. Have a good day!  Sincerely,  Maudie Mercury, MD

## 2019-12-06 NOTE — Progress Notes (Signed)
   CC: Diabetes  HPI:  [name] a 54 y.o. is here for a follow up on his diabetes. To see the management of his acute and chronic conditions, please see the separate A&P note under the encounters tab.   Past Medical History:  Diagnosis Date  . Anxiety    Panic Atacks  . Arthritis   . Carpal tunnel syndrome   . Constipation   . Depression   . Diabetes mellitus    DMII  . Dysrhythmia    hx tachy hr  . GERD (gastroesophageal reflux disease)    no problem in past few years.  . Hepatitis   . Hyperlipidemia   . Hypertension   . Neuropathy associated with endocrine disorder ([place])   . Seizures ([place])    "stress related" no more since taking Klonopin 2 years- See Neurologist  [facility]  . Shortness of breath dyspnea    with exertion  . Sleep apnea    used a cpap when he was 350lb-does not need one now 260lb  . Wears contact lenses    Review of Systems:   Review of Systems  Constitutional: Negative for chills, fever, malaise/fatigue and weight loss.  Respiratory: Negative for cough, hemoptysis and sputum production.   Cardiovascular: Negative for chest pain and palpitations.  Gastrointestinal: Negative for abdominal pain, diarrhea, nausea and vomiting.  Genitourinary: Negative for frequency.     Physical Exam:  Vitals:   12/06/19 1315  BP: 130/84  Pulse: (!) 57  Temp: 98.9 F (37.2 C)  TempSrc: Oral  SpO2: 100%  Weight: 269 lb 9.6 oz (122.3 kg)  Height: 5\' 10"  (1.778 m)   Physical Exam Constitutional:      General: He is not in acute distress.    Appearance: Normal appearance. He is obese. He is not ill-appearing, toxic-appearing or diaphoretic.  HENT:     Head: Normocephalic and atraumatic.  Eyes:     General: No scleral icterus.       Right eye: No discharge.        Left eye: No discharge.     Conjunctiva/sclera: Conjunctivae normal.  Cardiovascular:     Rate and Rhythm: Normal rate and regular rhythm.     Pulses: Normal pulses.     Heart  sounds: Normal heart sounds.  Pulmonary:     Effort: Pulmonary effort is normal. No respiratory distress.     Breath sounds: Normal breath sounds.  Neurological:     Mental Status: He is alert and oriented to person, place, and time.     Assessment & Plan:   See Encounters Tab for problem based charting.  Patient discussed with Dr. Dareen Piano.

## 2019-12-07 ENCOUNTER — Encounter: Payer: Self-pay | Admitting: Internal Medicine

## 2019-12-07 LAB — BMP8+ANION GAP
Anion Gap: 16 mmol/L (ref 10.0–18.0)
BUN/Creatinine Ratio: 20 (ref 9–20)
BUN: 20 mg/dL (ref 6–24)
CO2: 19 mmol/L — ABNORMAL LOW (ref 20–29)
Calcium: 9.3 mg/dL (ref 8.7–10.2)
Chloride: 101 mmol/L (ref 96–106)
Creatinine, Ser: 0.99 mg/dL (ref 0.76–1.27)
GFR calc Af Amer: 101 mL/min/{1.73_m2} (ref >59)
GFR calc non Af Amer: 87 mL/min/{1.73_m2} (ref >59)
Glucose: 116 mg/dL — ABNORMAL HIGH (ref 65–99)
Potassium: 5.1 mmol/L (ref 3.5–5.2)
Sodium: 136 mmol/L (ref 134–144)

## 2019-12-07 NOTE — Progress Notes (Signed)
Internal Medicine Clinic Attending ? ?Case discussed with Dr. Winters  At the time of the visit.  We reviewed the resident?s history and exam and pertinent patient test results.  I agree with the assessment, diagnosis, and plan of care documented in the resident?s note.  ?

## 2019-12-07 NOTE — Assessment & Plan Note (Signed)
[name] presents to the clinic today for a follow up on his diabetes. Today's A1c is 8.9 which is much improved over 12.9 from his last check up. He states that he is doing well on his medication regimen of metformin and Lantus. He has had no hypoglycemic events since his last visit.  - Continue Lantus 30U nightly - Continue Metformin 1000 mg BID - Repeat A1c in 3 months

## 2019-12-07 NOTE — Assessment & Plan Note (Signed)
Patient states that he feels more improved after restarting his Cymbalta, states he has more energy than previously but is still fatigued. He has been following with [name]. He states that she has given him very good advice and he finds the sessions to help him. His PHQ-9 today is 23 from 21 at last visit. Suggested increasing his dose of Cymbalta today. Patient agreeable.  - Increase Cymbalta to 60 mg QD  - Continue to follow with [name].

## 2019-12-12 ENCOUNTER — Ambulatory Visit: Payer: Self-pay | Admitting: Licensed Clinical Social Worker

## 2019-12-12 ENCOUNTER — Telehealth: Payer: Self-pay | Admitting: Licensed Clinical Social Worker

## 2019-12-12 ENCOUNTER — Other Ambulatory Visit: Payer: Self-pay

## 2019-12-12 NOTE — Telephone Encounter (Signed)
Patient was called for his scheduled visit. Patient reported he was driving and requested to be called back in 15 minutes. Patient was called back, and he did not answer the phone. A vm was left for the patient.

## 2019-12-18 MED FILL — LISINOPRIL 10 MG TABS: 10 | 30 days supply | Qty: 30 | Fill #2

## 2019-12-18 MED FILL — metFORMIN HCL 1000 MG TABS: 1000 | 30 days supply | Qty: 60 | Fill #4

## 2019-12-18 MED FILL — AMLODIPINE BESYLATE 10 MG T: 10 | 30 days supply | Qty: 30 | Fill #4

## 2019-12-18 MED FILL — GABAPENTIN 300 MG CAPSULE: 300 | 30 days supply | Qty: 90 | Fill #3

## 2019-12-18 MED FILL — METOPROLOL TARTRATE 100 MG: 100 | 30 days supply | Qty: 60 | Fill #4

## 2019-12-18 MED FILL — DULoxetine HCL 30 MG CPEP: 30 | 30 days supply | Qty: 60 | Fill #1

## 2019-12-26 ENCOUNTER — Other Ambulatory Visit: Payer: Self-pay

## 2019-12-26 ENCOUNTER — Ambulatory Visit (INDEPENDENT_AMBULATORY_CARE_PROVIDER_SITE_OTHER): Payer: Self-pay | Admitting: Internal Medicine

## 2019-12-26 ENCOUNTER — Encounter: Payer: Self-pay | Admitting: Internal Medicine

## 2019-12-26 VITALS — BP 134/86 | HR 63 | Temp 99.0°F | Ht 70.0 in | Wt 268.8 lb

## 2019-12-26 DIAGNOSIS — R1084 Generalized abdominal pain: Secondary | ICD-10-CM

## 2019-12-26 DIAGNOSIS — R109 Unspecified abdominal pain: Secondary | ICD-10-CM

## 2019-12-26 DIAGNOSIS — R110 Nausea: Secondary | ICD-10-CM

## 2019-12-26 MED ORDER — ONDANSETRON HCL 4 MG PO TABS: 4.0000 mg | ORAL_TABLET | Freq: Every day | ORAL | 1 refills | Status: DC | PRN

## 2019-12-26 MED ORDER — OMEPRAZOLE 20 MG PO CPDR: 20.0000 mg | DELAYED_RELEASE_CAPSULE | Freq: Every day | ORAL | 1 refills | Status: DC

## 2019-12-26 MED FILL — OMEPRAZOLE DR 20 MG CAPSULE: 20 | 30 days supply | Qty: 30 | Fill #0

## 2019-12-26 MED FILL — ONDANSETRON HCL 4 MG TABLET: 4 | 30 days supply | Qty: 30 | Fill #0

## 2019-12-26 NOTE — Progress Notes (Signed)
   CC: Nausea, Abdominal Pain  HPI:  [name] a 54 y.o. male, with a PMH noted below, who presents for nausea and abdominal pain. To see the management of his acute and chronic conditions, please see the attached A&P under the encounters tab  Past Medical History:  Diagnosis Date  . Anxiety    Panic Atacks  . Arthritis   . Carpal tunnel syndrome   . Constipation   . Depression   . Diabetes mellitus    DMII  . Dysrhythmia    hx tachy hr  . GERD (gastroesophageal reflux disease)    no problem in past few years.  . Hepatitis   . Hyperlipidemia   . Hypertension   . Neuropathy associated with endocrine disorder ([place])   . Seizures ([place])    "stress related" no more since taking Klonopin 2 years- See Neurologist  [facility]  . Shortness of breath dyspnea    with exertion  . Sleep apnea    used a cpap when he was 350lb-does not need one now 260lb  . Wears contact lenses    Review of Systems:   Review of Systems  Constitutional: Negative for chills, fever, malaise/fatigue and weight loss.  Gastrointestinal: Positive for abdominal pain, nausea and vomiting. Negative for blood in stool, constipation, diarrhea and melena.  Genitourinary: Negative for dysuria, flank pain, frequency, hematuria and urgency.  Musculoskeletal: Negative for back pain, myalgias and neck pain.  Neurological: Negative for dizziness and headaches.     Physical Exam:  Vitals:   12/26/19 1548 12/26/19 1557  BP: (!) 184/100 134/86  Pulse: 70 63  Temp: 99 F (37.2 C)   TempSrc: Oral   SpO2: 97%   Weight: 268 lb 12.8 oz (121.9 kg)   Height: 5\' 10"  (1.778 m)    Physical Exam Constitutional:      Appearance: Normal appearance. He is not toxic-appearing.  HENT:     Head: Normocephalic and atraumatic.  Cardiovascular:     Rate and Rhythm: Normal rate and regular rhythm.     Pulses: Normal pulses.     Heart sounds: Normal heart sounds. No murmur heard.  No friction rub. No gallop.    Pulmonary:     Effort: Pulmonary effort is normal.     Breath sounds: No wheezing, rhonchi or rales.  Abdominal:     General: Abdomen is flat. Bowel sounds are normal.     Tenderness: There is no abdominal tenderness. There is no guarding.  Skin:    General: Skin is warm and dry.  Neurological:     Mental Status: He is alert and oriented to person, place, and time.      Assessment & Plan:   See Encounters Tab for problem based charting.  Patient discussed with Dr. Dareen Piano

## 2019-12-26 NOTE — Patient Instructions (Signed)
To [name],  It was a pleasure to see you again! Today we discussed your nausea and stomach pain. We will start you on Prilosec for your stomach and Zofran for your nausea. We will additionally get imaging of your abdomen to rule out any masses given your family history of pancreatic cancer. I will see you in 3 months.  Sincerely,  Maudie Mercury, MD

## 2019-12-28 ENCOUNTER — Encounter: Payer: Self-pay | Admitting: Internal Medicine

## 2019-12-28 DIAGNOSIS — R109 Unspecified abdominal pain: Secondary | ICD-10-CM | POA: Insufficient documentation

## 2019-12-28 NOTE — Assessment & Plan Note (Signed)
Patient presents with abdominal pain since his last follow up. He endorses that after eating food he feels nauseated and will vomit. He has noticed new onset belching with his symptoms. He does not endorse reflux symptoms at this time. He now actively avoids meat products while shopping because the smell makes him nauseated. He confides a family history of pancreatic cancer, which he worries about. He has no changes in stool color or caliber.   His symptoms seem concurrent with possible progression of his diabetes. While it is currently trending down his A1c was elevated to 12.9% 4 months ago, concerning for gastroparesis. Will get abdominal imaging today, symptoms management. If imaging is negative will proceed with gastric emptying study.  - CT Abdomen and Pelvis - Zofran 4 mg daily PRN - Prilosec 20 mg daily

## 2019-12-28 NOTE — Progress Notes (Signed)
Internal Medicine Clinic Attending ? ?Case discussed with Dr. Winters  At the time of the visit.  We reviewed the resident?s history and exam and pertinent patient test results.  I agree with the assessment, diagnosis, and plan of care documented in the resident?s note.  ?

## 2020-01-18 ENCOUNTER — Other Ambulatory Visit: Payer: Self-pay

## 2020-01-18 ENCOUNTER — Ambulatory Visit (HOSPITAL_COMMUNITY)
Admission: RE | Admit: 2020-01-18 | Discharge: 2020-01-18 | Disposition: A | Discharge status: Home / Self Care | Payer: Self-pay | Source: Ambulatory Visit | Attending: Internal Medicine | Admitting: Internal Medicine

## 2020-01-18 DIAGNOSIS — R1084 Generalized abdominal pain: Secondary | ICD-10-CM | POA: Insufficient documentation

## 2020-01-18 LAB — POCT I-STAT CREATININE: Creatinine, Ser: 1 mg/dL (ref 0.61–1.24)

## 2020-01-18 MED ORDER — IOHEXOL 300 MG/ML  SOLN
100.0000 mL | Freq: Once | INTRAMUSCULAR | Status: AC | PRN
  Administered 2020-01-18: 100 mL via INTRAVENOUS

## 2020-01-22 ENCOUNTER — Other Ambulatory Visit: Payer: Self-pay

## 2020-01-22 ENCOUNTER — Other Ambulatory Visit: Payer: Self-pay | Admitting: Student

## 2020-01-22 DIAGNOSIS — R109 Unspecified abdominal pain: Secondary | ICD-10-CM

## 2020-01-22 DIAGNOSIS — E119 Type 2 diabetes mellitus without complications: Secondary | ICD-10-CM

## 2020-01-22 DIAGNOSIS — I10 Essential (primary) hypertension: Secondary | ICD-10-CM

## 2020-01-22 DIAGNOSIS — E1142 Type 2 diabetes mellitus with diabetic polyneuropathy: Secondary | ICD-10-CM

## 2020-01-22 MED ORDER — METFORMIN HCL 1000 MG PO TABS: 1000.0000 mg | ORAL_TABLET | Freq: Two times a day (BID) | ORAL | 3 refills | Status: DC

## 2020-01-22 MED ORDER — INSULIN GLARGINE 100 UNITS/ML SOLOSTAR PEN: 30.0000 [IU] | PEN_INJECTOR | Freq: Every day | SUBCUTANEOUS | 4 refills | Status: DC

## 2020-01-22 MED ORDER — AMLODIPINE BESYLATE 10 MG PO TABS: 10.0000 mg | ORAL_TABLET | Freq: Every day | ORAL | 3 refills | Status: DC

## 2020-01-22 MED ORDER — ROSUVASTATIN CALCIUM 20 MG PO TABS: 40.0000 mg | ORAL_TABLET | Freq: Every day | ORAL | 1 refills | Status: DC

## 2020-01-22 MED ORDER — GABAPENTIN 300 MG PO CAPS: 300.0000 mg | ORAL_CAPSULE | Freq: Three times a day (TID) | ORAL | 3 refills | Status: DC

## 2020-01-22 MED ORDER — DULOXETINE HCL 60 MG PO CPEP: 60.0000 mg | ORAL_CAPSULE | Freq: Every day | ORAL | 2 refills | Status: DC

## 2020-01-22 MED ORDER — "INSULIN SYRINGE 31G X 5/16"" 0.3 ML MISC": 3 refills | Status: DC

## 2020-01-22 MED ORDER — LISINOPRIL 10 MG PO TABS: 10.0000 mg | ORAL_TABLET | Freq: Every day | ORAL | 2 refills | Status: DC

## 2020-01-22 MED ORDER — METOPROLOL TARTRATE 100 MG PO TABS: 100.0000 mg | ORAL_TABLET | Freq: Two times a day (BID) | ORAL | 3 refills | Status: DC

## 2020-01-22 NOTE — Telephone Encounter (Signed)
Patient approved for orange card and needs meds resent to [facility]. Also had CT scan last week and needs to speak to MD r/t results, states the omeprazole and zofran not working, wants to know next steps. Thank you, SChaplin, RN,BSN

## 2020-01-22 NOTE — Progress Notes (Signed)
Patient's orange card was recently approved so all medications were resent to give for [place]. Patient was called to CT abdomen/pelvis results. Patient was informed that since the CT scan was negative, the next steps would be to do a gastric emptying study. Patient was in agreement with plan. Patient also states his symptoms have not improved with Zofran and omeprazole. Patient was advised to continue medications, eat a low-fat diet, soups and smoothies to help decrease symptoms of nausea and bloating. Patient denies constipation but states he has not had a bowel movement in 3 days has dry heaves whenever he tries to use the bathroom. He has decreased his oral intake due to symptoms.  Plan: --Ordered gastric emptying study --Advised to continue Prilosec 20 mg daily and Zofran 4 mg daily as needed --Advised to call office if symptoms worsen

## 2020-01-22 NOTE — Addendum Note (Signed)
Addended by: Velora Heckler on: 01/22/2020 01:11 PM   Modules accepted: Orders

## 2020-01-22 NOTE — Addendum Note (Signed)
Addended by: Velora Heckler on: 01/22/2020 03:23 PM   Modules accepted: Orders

## 2020-02-02 ENCOUNTER — Other Ambulatory Visit: Payer: Self-pay | Admitting: Internal Medicine

## 2020-02-06 ENCOUNTER — Other Ambulatory Visit: Payer: Self-pay

## 2020-02-06 MED FILL — DULoxetine HCL 60 MG CPEP: 60 | 30 days supply | Qty: 30 | Fill #0

## 2020-02-06 NOTE — Telephone Encounter (Signed)
DULoxetine (CYMBALTA) 60 MG capsule, refill request @  [place], [place] - [address] [facility]. Phone:  [phone]  Fax:  [phone]

## 2020-02-13 ENCOUNTER — Ambulatory Visit (HOSPITAL_COMMUNITY): Payer: Self-pay | Attending: Internal Medicine

## 2020-03-07 ENCOUNTER — Encounter: Payer: Self-pay | Admitting: Internal Medicine

## 2020-03-07 ENCOUNTER — Other Ambulatory Visit: Payer: Self-pay

## 2020-03-07 ENCOUNTER — Ambulatory Visit (INDEPENDENT_AMBULATORY_CARE_PROVIDER_SITE_OTHER): Payer: Self-pay | Admitting: Internal Medicine

## 2020-03-07 VITALS — BP 157/87 | HR 61 | Temp 98.3°F | Ht 70.0 in | Wt 269.1 lb

## 2020-03-07 DIAGNOSIS — E119 Type 2 diabetes mellitus without complications: Secondary | ICD-10-CM

## 2020-03-07 DIAGNOSIS — R109 Unspecified abdominal pain: Secondary | ICD-10-CM

## 2020-03-07 DIAGNOSIS — R112 Nausea with vomiting, unspecified: Secondary | ICD-10-CM

## 2020-03-07 DIAGNOSIS — I10 Essential (primary) hypertension: Secondary | ICD-10-CM

## 2020-03-07 LAB — GLUCOSE, CAPILLARY: Glucose-Capillary: 113 mg/dL — ABNORMAL HIGH (ref 70–99)

## 2020-03-07 LAB — POCT GLYCOSYLATED HEMOGLOBIN (HGB A1C): Hemoglobin A1C: 9 % — AB (ref 4.0–5.6)

## 2020-03-07 MED ORDER — METOCLOPRAMIDE HCL 5 MG PO TABS: 5.0000 mg | ORAL_TABLET | Freq: Three times a day (TID) | ORAL | 0 refills | Status: DC

## 2020-03-07 MED ORDER — LISINOPRIL 20 MG PO TABS: 20.0000 mg | ORAL_TABLET | Freq: Every day | ORAL | 11 refills | Status: DC

## 2020-03-07 NOTE — Patient Instructions (Addendum)
To Mr. Rubel,   It was a pleasure to see you today! Today we discussed your diabetes, blood pressure, and stomach pain. For your diabetes continue your medications. For your blood pressure we have increased your lisinopril to 20 mg. For your stomach pain, continue taking your omeprazole. Please discontinue your Zofran, and instead take Reglan 5 mg three times daily. I will see you in a month's time!  Sincerely,  Maudie Mercury, MD

## 2020-03-07 NOTE — Progress Notes (Signed)
   CC: Diabetes Follow Up    HPI:  [name] is a 54 y.o. male, with a PMH noted below, who presents to clinic for follow up of diabetes. To see the management of his acute and chronic conditions, please see the A&P note under the encounter tab.   Past Medical History:  Diagnosis Date  . Anxiety    Panic Atacks  . Arthritis   . Carpal tunnel syndrome   . Constipation   . Depression   . Diabetes mellitus    DMII  . Dysrhythmia    hx tachy hr  . GERD (gastroesophageal reflux disease)    no problem in past few years.  . Hepatitis   . Hyperlipidemia   . Hypertension   . Neuropathy associated with endocrine disorder ([place])   . Seizures ([place])    "stress related" no more since taking Klonopin 2 years- See Neurologist  [facility]  . Shortness of breath dyspnea    with exertion  . Sleep apnea    used a cpap when he was 350lb-does not need one now 260lb  . Wears contact lenses    Review of Systems:   Review of Systems  Constitutional: Positive for weight loss. Negative for chills, diaphoresis, fever and malaise/fatigue.  Eyes: Negative for blurred vision and double vision.  Respiratory: Negative for hemoptysis, sputum production and shortness of breath.   Cardiovascular: Negative for chest pain, palpitations and orthopnea.  Gastrointestinal: Positive for abdominal pain, nausea and vomiting. Negative for blood in stool, constipation, diarrhea, heartburn and melena.  Musculoskeletal: Negative for myalgias.  Neurological: Negative for dizziness, tingling, tremors and headaches.     Physical Exam:  Vitals:   03/07/20 1315 03/07/20 1352  BP: (!) 167/105 (!) 157/87  Pulse: 69 61  Temp: 98.3 F (36.8 C)   TempSrc: Oral   SpO2: 100%   Weight: 269 lb 1.6 oz (122.1 kg)   Height: 5\' 10"  (1.778 m)    Physical Exam Constitutional:      General: He is not in acute distress.    Appearance: He is obese. He is not ill-appearing, toxic-appearing or diaphoretic.  HENT:      Head: Normocephalic and atraumatic.  Cardiovascular:     Rate and Rhythm: Normal rate and regular rhythm.     Pulses: Normal pulses.     Heart sounds: Normal heart sounds. No murmur heard.  No friction rub. No gallop.   Pulmonary:     Effort: Pulmonary effort is normal.     Breath sounds: Normal breath sounds. No wheezing, rhonchi or rales.  Abdominal:     General: Abdomen is flat. Bowel sounds are normal.     Palpations: Abdomen is soft.     Tenderness: There is no abdominal tenderness. There is no guarding.  Neurological:     Mental Status: He is alert and oriented to person, place, and time.  Psychiatric:        Mood and Affect: Mood normal.        Behavior: Behavior normal.     Assessment & Plan:   See Encounters Tab for problem based charting.  Patient discussed with Dr. [name]

## 2020-03-08 ENCOUNTER — Other Ambulatory Visit: Payer: Self-pay | Admitting: *Deleted

## 2020-03-08 ENCOUNTER — Encounter: Payer: Self-pay | Admitting: Internal Medicine

## 2020-03-08 DIAGNOSIS — E119 Type 2 diabetes mellitus without complications: Secondary | ICD-10-CM

## 2020-03-08 MED ORDER — INSULIN GLARGINE 100 UNITS/ML SOLOSTAR PEN: 30.0000 [IU] | PEN_INJECTOR | Freq: Every day | SUBCUTANEOUS | 5 refills | Status: DC

## 2020-03-08 NOTE — Assessment & Plan Note (Signed)
Patient presents for follow up on his abdominal pain. He continues to endorse abdominal pain with vomiting and nausea when he eats food. He has had significant weight loss due to not being able to eat. He states that he is only able to hold down [place] ice, and fruit popsicles. He was scheduled for a gastric emptying study, but unfortunately missed his appointment due to his hyperglycemia (see DM note). The zofran prescribed has not been helpful.  A:  Patient had an unrevealing EGD this year, his CT abdomen/pelvis was negative for acute pathology. Likely his abdominal discomfort is secondary to gastroparesis. It is unfortunate that he missed his gastric emptying study, but he is amendable to rescheduling it for the earliest appointment. He was strongly encouraged to tell the IR department what medications he would need to hold before the procedure. Patient's zofran has not been beneficial for his nausea. Will treat his symptoms with metoclopramide, hopefully this will help for his abdominal pain.   P:  -Continue Prilosec 20 mg daily  - DISCONTINUED ZOFRAN 4mg  daily. Instructed patient to stop taking medication.  - Start Reglan 5 mg three times daily.

## 2020-03-08 NOTE — Assessment & Plan Note (Signed)
Patient presents with elevated BP:   Vitals with BMI 03/07/2020 03/07/2020 12/26/2019  Height - 5\' 10"  -  Weight - 269 lbs 2 oz -  BMI - 47.34 -  Systolic [phone]  Diastolic 87 381 86  Pulse 61 69 63    He is currently taking Lisinopril 10 mg daily, Amlodipine 10 mg daily, and Metoprolol 100 mg twice daily. He states that he is compliant with his medications. He states that sometimes his lisinopril makes him light headed, but takes it at bedtime daily, without issue. He endorses his pressures being elevated because he walked to the clinic instead of being escorted. He additionally takes his BP at home with systolic pressures in the 130s. His pressures were still elevated on recheck. Will increase his lisinopril dosing.   P:  - Continue metoprolol 100 mg twice daily  - Continue Amlodipine 10 mg daily - Increase Lisinopril to 20 mg daily at bedtime.  - Continue to monitor pressures at home.  - FU in 4 weeks for BP check.

## 2020-03-08 NOTE — Assessment & Plan Note (Signed)
Patient presents to the clinic for follow up on his diabetes management. He is currently on metformin 1000 mg twice daily and Lantus 30 units daily.   Patient admits that he did run out of his medications since the last time he was in the office. During this time, he was waiting to clear through the MAP program, but his sugars were elevated in the 300-400s. He endorses having blurry vision, LOC, fever, and visual hallucinations. His roommate called the EMS, but [name] declined to go to the ED. He was treated with his cousin's insulin and took several days to recover. He was then cleared for MAP and has been taking his medications as scheduled.   A/P:  Patient presents with A1c of 9.0 from 8.9 last visit. We discussed the dangers of not taking his insulin, and that if he were to ever have similar symptoms again to immediately go to the ED. Patient voiced understanding, and understands the dangers of elevated sugars. Will continue his current regimen.   - Continue Lantus 30U  - Continue metformin 1000 mg twice daily

## 2020-03-19 ENCOUNTER — Encounter (HOSPITAL_COMMUNITY)
Admission: RE | Admit: 2020-03-19 | Discharge: 2020-03-19 | Disposition: A | Discharge status: Home / Self Care | Payer: Self-pay | Source: Ambulatory Visit | Attending: Internal Medicine | Admitting: Internal Medicine

## 2020-03-19 ENCOUNTER — Other Ambulatory Visit: Payer: Self-pay

## 2020-03-19 DIAGNOSIS — E119 Type 2 diabetes mellitus without complications: Secondary | ICD-10-CM | POA: Insufficient documentation

## 2020-03-19 DIAGNOSIS — E1142 Type 2 diabetes mellitus with diabetic polyneuropathy: Secondary | ICD-10-CM | POA: Insufficient documentation

## 2020-03-19 DIAGNOSIS — R109 Unspecified abdominal pain: Secondary | ICD-10-CM | POA: Insufficient documentation

## 2020-03-19 MED ORDER — TECHNETIUM TC 99M SULFUR COLLOID
2.0000 | Freq: Once | INTRAVENOUS | Status: AC | PRN
  Administered 2020-03-19: 2 via INTRAVENOUS

## 2020-03-19 NOTE — Progress Notes (Signed)
Internal Medicine Clinic Attending  Case discussed with Dr. Winters at the time of the visit.  We reviewed the resident's history and exam and pertinent patient test results.  I agree with the assessment, diagnosis, and plan of care documented in the resident's note.  Alexander Raines, M.D., Ph.D.  

## 2020-03-27 ENCOUNTER — Other Ambulatory Visit: Payer: Self-pay | Admitting: Student

## 2020-03-27 MED ORDER — DULOXETINE HCL 60 MG PO CPEP: 60.0000 mg | ORAL_CAPSULE | Freq: Every day | ORAL | 2 refills | Status: DC

## 2020-03-27 MED FILL — DULoxetine HCL 60 MG CPEP: 60 | 30 days supply | Qty: 30 | Fill #0

## 2020-04-08 ENCOUNTER — Other Ambulatory Visit: Payer: Self-pay

## 2020-04-08 ENCOUNTER — Other Ambulatory Visit: Payer: Self-pay | Admitting: Internal Medicine

## 2020-04-08 ENCOUNTER — Ambulatory Visit (INDEPENDENT_AMBULATORY_CARE_PROVIDER_SITE_OTHER): Payer: Self-pay | Admitting: Internal Medicine

## 2020-04-08 VITALS — BP 142/86 | HR 68 | Temp 98.2°F | Ht 70.0 in | Wt 265.9 lb

## 2020-04-08 DIAGNOSIS — K5904 Chronic idiopathic constipation: Secondary | ICD-10-CM

## 2020-04-08 DIAGNOSIS — S81811A Laceration without foreign body, right lower leg, initial encounter: Secondary | ICD-10-CM

## 2020-04-08 DIAGNOSIS — I10 Essential (primary) hypertension: Secondary | ICD-10-CM

## 2020-04-08 DIAGNOSIS — E119 Type 2 diabetes mellitus without complications: Secondary | ICD-10-CM

## 2020-04-08 DIAGNOSIS — R109 Unspecified abdominal pain: Secondary | ICD-10-CM

## 2020-04-08 MED ORDER — SENNOSIDES-DOCUSATE SODIUM 8.6-50 MG PO TABS: 1.0000 | ORAL_TABLET | Freq: Every day | ORAL | 2 refills | Status: DC

## 2020-04-08 NOTE — Progress Notes (Signed)
CC: abdominal pain  HPI:  [name] is a 55 y.o. male with a past medical history stated below and presents today for abdominal pain. Please see problem based assessment and plan for additional details.  Past Medical History:  Diagnosis Date  . Anxiety    Panic Atacks  . Arthritis   . Carpal tunnel syndrome   . Constipation   . Depression   . Diabetes mellitus    DMII  . Dysrhythmia    hx tachy hr  . GERD (gastroesophageal reflux disease)    no problem in past few years.  . Hepatitis   . Hyperlipidemia   . Hypertension   . Neuropathy associated with endocrine disorder ([place])   . Seizures ([place])    "stress related" no more since taking Klonopin 2 years- See Neurologist  [facility]  . Shortness of breath dyspnea    with exertion  . Sleep apnea    used a cpap when he was 350lb-does not need one now 260lb  . Wears contact lenses     Current Outpatient Medications on File Prior to Visit  Medication Sig Dispense Refill  . amLODipine (NORVASC) 10 MG tablet Take 1 tablet (10 mg total) by mouth daily. 90 tablet 3  . cyclobenzaprine (FLEXERIL) 10 MG tablet Take 10 mg by mouth 3 (three) times daily as needed.    . DULoxetine (CYMBALTA) 60 MG capsule Take 1 capsule (60 mg total) by mouth daily. 30 capsule 2  . gabapentin (NEURONTIN) 300 MG capsule Take 1 capsule (300 mg total) by mouth 3 (three) times daily. 360 capsule 3  . insulin glargine (LANTUS) 100 unit/mL SOPN Inject 30 Units into the skin daily. 9 mL 5  . Insulin Syringe-Needle U-100 (INSULIN SYRINGE .3CC/31GX5/16") 31G X 5/16" 0.3 ML MISC The patient is insulin requiring, ICD 10 code E11.9. The uses insulin 2 times per day. 100 each 3  . lisinopril (ZESTRIL) 20 MG tablet Take 1 tablet (20 mg total) by mouth daily. 30 tablet 11  . metFORMIN (GLUCOPHAGE) 1000 MG tablet Take 1 tablet (1,000 mg total) by mouth 2 (two) times daily with a meal. 180 tablet 3  . metoCLOPramide (REGLAN) 5 MG tablet Take 1 tablet (5 mg  total) by mouth 3 (three) times daily. 90 tablet 0  . metoprolol tartrate (LOPRESSOR) 100 MG tablet Take 1 tablet (100 mg total) by mouth 2 (two) times daily. 120 tablet 3  . omeprazole (PRILOSEC) 20 MG capsule Take 1 capsule (20 mg total) by mouth daily. 30 capsule 1  . rosuvastatin (CRESTOR) 20 MG tablet Take 2 tablets (40 mg total) by mouth daily. 360 tablet 1   No current facility-administered medications on file prior to visit.    Family History  Problem Relation Age of Onset  . Cancer Mother        pancres  . Diabetes Mother   . Hypertension Brother   . Renal cancer Brother   . Cancer Maternal Uncle   . Colon cancer Maternal Uncle   . Colon cancer Maternal Uncle   . Stomach cancer Maternal Uncle   . Esophageal cancer Neg Hx   . Rectal cancer Neg Hx     Social History   Socioeconomic History  . Marital status: Single    Spouse name: Not on file  . Number of children: Not on file  . Years of education: Not on file  . Highest education level: Not on file  Occupational History  . Not on file  Tobacco Use  . Smoking status: Former Smoker    Quit date: 10/27/2008    Years since quitting: 11.4  . Smokeless tobacco: Never Used  . Tobacco comment: Quit x 10 yrs.  Vaping Use  . Vaping Use: Never used  Substance and Sexual Activity  . Alcohol use: Yes    Comment: Beer sometimes.  . Drug use: No    Types: Marijuana  . Sexual activity: Never  Other Topics Concern  . Not on file  Social History Narrative  . Not on file   Social Determinants of Health   Financial Resource Strain: Not on file  Food Insecurity: Not on file  Transportation Needs: Not on file  Physical Activity: Not on file  Stress: Not on file  Social Connections: Not on file  Intimate Partner Violence: Not on file    Review of Systems: ROS negative except for what is noted on the assessment and plan.  Vitals:   04/08/20 1356  BP: (!) 142/86  Pulse: 68  Temp: 98.2 F (36.8 C)  TempSrc: Oral   SpO2: 100%  Weight: 265 lb 14.4 oz (120.6 kg)  Height: 5\' 10"  (1.778 m)     Physical Exam: Physical Exam Constitutional:      Appearance: Normal appearance.  HENT:     Head: Normocephalic and atraumatic.  Eyes:     Extraocular Movements: Extraocular movements intact.  Cardiovascular:     Rate and Rhythm: Normal rate.     Pulses: Normal pulses.     Heart sounds: Normal heart sounds.  Pulmonary:     Effort: Pulmonary effort is normal.     Breath sounds: Normal breath sounds.  Abdominal:     General: Bowel sounds are normal. There is no distension.     Palpations: Abdomen is soft.     Tenderness: There is no abdominal tenderness.  Musculoskeletal:        General: Normal range of motion.     Cervical back: Normal range of motion.     Right lower leg: No edema.     Left lower leg: No edema.  Skin:    General: Skin is warm and dry.     Findings: Lesion (4 cm in diameter skin tear with clean red wound base good wound edges without maceration and no evidence of infection or purulent discharge.) present.  Neurological:     Mental Status: He is alert and oriented to person, place, and time. Mental status is at baseline.  Psychiatric:        Mood and Affect: Mood normal.      Assessment & Plan:   See Encounters Tab for problem based charting.  Patient discussed with Dr. Hilbert Odor, D.O. [facility], PGY-2 Pager: [phone], Phone: [phone] Date 04/09/2020 Time 5:54 PM

## 2020-04-08 NOTE — Patient Instructions (Signed)
Thank you, Mr.Ogden E Barstow for allowing [place] to provide your care today. Today we discussed Abdominal pain and constipation.    I have ordered the following labs for you:  Lab Orders  No laboratory test(s) ordered today     Tests ordered today:  none  Referrals ordered today:   Referral Orders  No referral(s) requested today     I have ordered the following medication/changed the following medications:   Stop the following medications: There are no discontinued medications.   Start the following medications: Meds ordered this encounter  Medications  . senna-docusate (SENOKOT-S) 8.6-50 MG tablet    Sig: Take 1 tablet by mouth at bedtime.    Dispense:  30 tablet    Refill:  2    Miralax - 1 cap full daily.   Follow up: 3 months    Remember: Keep wound clean, use petroleum based wound protection, and keep wound covered.  Should you have any questions or concerns please call the internal medicine clinic at [phone].     Marianna Payment, D.O. [place]

## 2020-04-09 ENCOUNTER — Encounter: Payer: Self-pay | Admitting: Internal Medicine

## 2020-04-09 DIAGNOSIS — S81819A Laceration without foreign body, unspecified lower leg, initial encounter: Secondary | ICD-10-CM | POA: Insufficient documentation

## 2020-04-09 NOTE — Assessment & Plan Note (Signed)
Patient is currently taking Metformin 1000 mg twice daily, glargine 30 units daily.  He is up-to-date on his eye exam with his last eye exam on 11/10/2019 showing retinopathy, and foot exam roughly 3 months ago with some lower extremity peripheral neuropathy noted.  His last A1c 12/02 was 9.  Patient will need to follow-up with his PCP in 2 months for further evaluation of his diabetes.

## 2020-04-09 NOTE — Assessment & Plan Note (Addendum)
Patient presents with elevated blood pressure of 142/86 on amlodipine 10 mg, metoprolol 100 mg twice daily, lisinopril 20 mg daily.  Patient states that he is taking his medications without medication side effects and tolerating them well.  Patient presents for follow-up for his abdominal pain but will likely need further evaluation for titration of these blood pressure medications in the near future.  Plan: -Continue amlodipine 10 mg, metoprolol 100 mg twice daily, lisinopril 20 mg daily

## 2020-04-09 NOTE — Assessment & Plan Note (Signed)
Presented with a right lower extremity skin tear.  The skin tear was roughly 4 cm in diameter with clean wound base and margins/edges.  Wound appears to be healing well.  I counseled the patient regarding his risk for diabetic infections and poor wound healing.  I discussed using petroleum-based gel to protect the wound and use a nonstick pad to cover the wound.  Patient admits to understanding and will call us if his wound changes in any way.

## 2020-04-09 NOTE — Assessment & Plan Note (Addendum)
Patient presents today for a follow-up for his chronic abdominal pain.  Patient symptoms are fairly difficult to follow as his symptoms seem fairly ambiguous and intermittent in nature.  Patient states that he has had this chronic abdominal pain for roughly 5 years.  It is intermittent in nature and he describes periods of up to 2 weeks to months without experiencing any abdominal pain with sudden onset of epigastric pain and right upper quadrant pain that is stabbing with associated constipation.  This pain can last anywhere between 15 minutes to an entire day.  His longest episode was about 2 to 3 weeks.  During these episodes he feels very nauseated and can only tolerating very small amounts of food experiences coughing and burping excessively.  Patient admits to a significant past medical history for GI cancers in his mother and 2 brothers.  Mother and brother both had pancreatic cancer and his other brother died from gastric cancer. He is recently had an EGD and colonoscopy without significant pathology noted.  Furthermore CT of his abdomen was unremarkable as well.  He is started Prilosec and Reglan 5 mg 3 times daily with some improvement of his symptoms.  Due to concern for gastroparesis, a gastric emptying study was performed that was within normal limits.  Patient states that his last bowel movement was Thursday and states that this is fairly consistent for him.  States that he is not currently taking any medications to help him have a bowel movement.  On exam patient's abdomen is soft minimally tender to touch in the epigastric region with normal bowel sounds in all 4 quadrants.  I counseled him regarding his abdominal pain and reviewed his work-up with him.  I discussed the importance of good bowel health, particularly portance of having a regular bowel movement daily.  Therefore I will start him on a bowel regimen today.  Plan: -Senna-docusate 8.6-50 mg tablets once daily -MiraLAX as needed for  constipation

## 2020-04-10 NOTE — Progress Notes (Signed)
Internal Medicine Clinic Attending  Case discussed with Dr. Marianna Payment  At the time of the visit.  We reviewed the resident's history and exam and pertinent patient test results.  I agree with the assessment, diagnosis, and plan of care documented in the resident's note.  Senna S can be increased if needed to 2 tabs twice daily.  Given the occasional nature of the symptoms and associated constipation, irritable bowel syndrome is a strong diagnostic consideration and I agree with Dr. Sammie Bench emphasis on establishing more regular bowel function.  No extrapyramidal signs or symptoms have been reported while the patient is taking metoclopramide.

## 2020-04-25 ENCOUNTER — Ambulatory Visit: Payer: Self-pay

## 2020-05-07 ENCOUNTER — Ambulatory Visit: Payer: Self-pay

## 2020-06-10 ENCOUNTER — Other Ambulatory Visit: Payer: Self-pay | Admitting: Internal Medicine

## 2020-06-10 ENCOUNTER — Ambulatory Visit (INDEPENDENT_AMBULATORY_CARE_PROVIDER_SITE_OTHER): Payer: Self-pay | Admitting: Internal Medicine

## 2020-06-10 ENCOUNTER — Encounter: Payer: Self-pay | Admitting: Internal Medicine

## 2020-06-10 ENCOUNTER — Other Ambulatory Visit: Payer: Self-pay

## 2020-06-10 VITALS — BP 172/109 | HR 110 | Temp 98.8°F | Ht 70.0 in | Wt 253.4 lb

## 2020-06-10 DIAGNOSIS — Z125 Encounter for screening for malignant neoplasm of prostate: Secondary | ICD-10-CM | POA: Insufficient documentation

## 2020-06-10 DIAGNOSIS — R109 Unspecified abdominal pain: Secondary | ICD-10-CM

## 2020-06-10 DIAGNOSIS — I10 Essential (primary) hypertension: Secondary | ICD-10-CM

## 2020-06-10 DIAGNOSIS — N400 Enlarged prostate without lower urinary tract symptoms: Secondary | ICD-10-CM

## 2020-06-10 DIAGNOSIS — E119 Type 2 diabetes mellitus without complications: Secondary | ICD-10-CM

## 2020-06-10 DIAGNOSIS — L600 Ingrowing nail: Secondary | ICD-10-CM | POA: Insufficient documentation

## 2020-06-10 DIAGNOSIS — F32A Depression, unspecified: Secondary | ICD-10-CM

## 2020-06-10 DIAGNOSIS — S81811D Laceration without foreign body, right lower leg, subsequent encounter: Secondary | ICD-10-CM

## 2020-06-10 LAB — POCT GLYCOSYLATED HEMOGLOBIN (HGB A1C): Hemoglobin A1C: 7.4 % — AB (ref 4.0–5.6)

## 2020-06-10 LAB — GLUCOSE, CAPILLARY: Glucose-Capillary: 189 mg/dL — ABNORMAL HIGH (ref 70–99)

## 2020-06-10 MED ORDER — SERTRALINE HCL 25 MG PO TABS: 25.0000 mg | ORAL_TABLET | Freq: Every day | ORAL | 2 refills | Status: DC

## 2020-06-10 MED FILL — SERTRALINE HCL 25 MG TABLET: 25 | 30 days supply | Qty: 30 | Fill #0

## 2020-06-10 NOTE — Assessment & Plan Note (Addendum)
Patient presents with questions regarding prostate screening, after two of his friends were found to have prostate cancer. Patient has a Hx of enlarged prostate and FHx of prostate cancer in two of his maternal uncles. Given his Hx and PMHx and age, will proceed with screening.  - PSA today  Addendum:  PSA level arrived at 8.3. Updated patient on results. Will have a lab visit in 6-8 weeks time for repeat PSA. If continues to be elevated, will refer to urology for next steps.

## 2020-06-10 NOTE — Progress Notes (Unsigned)
   CC: Abdominal Pain   HPI:  Mr.Johan E Krohn is a 55 y.o. M/F, with a PMH noted below, who presents to the clinic for follow up on his abdominal pain. To see the management of their acute and chronic conditions, please see the A&P note under the Encounters tab.   Past Medical History:  Diagnosis Date  . Anxiety    Panic Atacks  . Arthritis   . Carpal tunnel syndrome   . Constipation   . Depression   . Diabetes mellitus    DMII  . Dysrhythmia    hx tachy hr  . GERD (gastroesophageal reflux disease)    no problem in past few years.  . Hepatitis   . Hyperlipidemia   . Hypertension   . Neuropathy associated with endocrine disorder ([place])   . Seizures ([place])    "stress related" no more since taking Klonopin 2 years- See Neurologist  [facility]  . Shortness of breath dyspnea    with exertion  . Sleep apnea    used a cpap when he was 350lb-does not need one now 260lb  . Wears contact lenses    Review of Systems:   Review of Systems  Constitutional: Negative for chills, fever, malaise/fatigue and weight loss.  Eyes: Negative for blurred vision and double vision.  Respiratory: Negative for cough, hemoptysis and sputum production.   Cardiovascular: Negative for chest pain, palpitations, orthopnea and claudication.  Gastrointestinal: Negative for abdominal pain, blood in stool, constipation, diarrhea, nausea and vomiting.  Neurological: Negative for dizziness, tingling, tremors, sensory change and headaches.     Physical Exam:  Vitals:   06/10/20 1308  BP: (!) 172/109  Pulse: (!) 110  Temp: 98.8 F (37.1 C)  TempSrc: Oral  SpO2: 100%  Weight: 253 lb 6.4 oz (114.9 kg)  Height: '5\' 10"'$  (1.778 m)   Physical Exam Constitutional:      General: He is not in acute distress.    Appearance: He is well-developed. He is not ill-appearing or toxic-appearing.  HENT:     Head: Normocephalic and atraumatic.  Cardiovascular:     Rate and Rhythm: Normal rate and regular rhythm.      Heart sounds: Normal heart sounds. No murmur heard. No friction rub.  Pulmonary:     Effort: Pulmonary effort is normal.     Breath sounds: Normal breath sounds. No wheezing, rhonchi or rales.  Abdominal:     General: Abdomen is flat. Bowel sounds are normal.     Palpations: Abdomen is soft.     Tenderness: There is no abdominal tenderness. There is no guarding.  Musculoskeletal:        General: No swelling or deformity. Normal range of motion.     Right lower leg: No edema.     Left lower leg: No edema.  Neurological:     Mental Status: He is alert and oriented to person, place, and time.  Psychiatric:        Mood and Affect: Mood normal.        Behavior: Behavior normal.      Assessment & Plan:   See Encounters Tab for problem based charting.  Patient discussed with Dr. Heber [place]

## 2020-06-10 NOTE — Assessment & Plan Note (Signed)
Follow up on patient RLE skin tear. Physical examination shows a well healed scar over the RLE with no purulence, drainage, or erythema.

## 2020-06-10 NOTE — Assessment & Plan Note (Signed)
Patient with elevated pressures and tachycardia as below:  Vitals with BMI 06/10/2020 04/08/2020 03/07/2020  Height '5\' 10"'$  '5\' 10"'$  -  Weight 253 lbs 6 oz 265 lbs 14 oz -  BMI [id number] -  Systolic [id number]  Diastolic 0000000 86 87  Pulse 110 68 61   Per the patient, he has been out of his current regimen of Lisinopril 20 mg daily, metoprolol 100 mg twice daily, and amlodipine 10 mg daily for the past month. Patient states that he does have refills and will be picking up his blood pressure medications today.  - Continue current regimen - Check vitals at next visit - BMP next visit

## 2020-06-10 NOTE — Assessment & Plan Note (Signed)
Patient with loss of arch in his RLE and multiple ingrown toe nails of the LLE presents today. Will refer to podiatry for further management. - Referral to podiatry.

## 2020-06-10 NOTE — Patient Instructions (Addendum)
To [name],  It was a pleasure seeing you today! Today we discussed your diabetes, prostate screening,  foot, abdominal pain, and blood pressure medications. We will continue the Lantus 30 units twice daily with the metformin for your diabetes. We will check your PSA level today for your prostate, I will refer you to podiatry for your foot. We will start a low dose of zoloft today for your abdominal pain/anxiety, I am attaching a sheet to your paperwork about serotonin syndrome. If you notice those side effects please stop taking the medication and present to the emergency department. Please also pick up your blood pressure medications today as well. Lastly, I will fill out your handicap parking pass and mail it.  Have a good day and I will see you in three months!  Maudie Mercury, MD Serotonin Syndrome Serotonin is a chemical in your body (neurotransmitter) that helps to control several functions, such as:  Brain and nerve cell function.  Mood and emotions.  Memory.  Eating.  Sleeping.  Sexual activity.  Stress response. Having too much serotonin in your body can cause serotonin syndrome. This condition can be harmful to your brain and nerve cells. This can be a life-threatening condition. What are the causes? This condition may be caused by taking medicines or drugs that increase the level of serotonin in your body, such as:  Antidepressant medicines.  Migraine medicines.  Certain pain medicines.  Certain drugs, including ecstasy, LSD, cocaine, and amphetamines.  Over-the-counter cough or cold medicines that contain dextromethorphan.  Certain herbal supplements, including St. John's wort, ginseng, and nutmeg. This condition usually occurs when you take these medicines or drugs in combination, but it can also happen with a high dose of a single medicine or drug. What increases the risk? You are more likely to develop this condition if:  You just started taking a medicine or  drug that increases the level of serotonin in the body.  You recently increased the dose of a medicine or drug that increases the level of serotonin in the body.  You take more than one medicine or drug that increases the level of serotonin in the body. What are the signs or symptoms? Symptoms of this condition usually start within several hours of taking a medicine or drug. Symptoms may be mild or severe. Mild symptoms include:  Sweating.  Restlessness or agitation.  Muscle twitching or stiffness.  Rapid heart rate.  Nausea and vomiting.  Diarrhea.  Headache.  Shivering or goose bumps.  Confusion. Severe symptoms include:  Irregular heartbeat.  Seizures.  Loss of consciousness.  High fever. How is this diagnosed? This condition may be diagnosed based on:  Your medical history.  A physical exam.  Your prior use of drugs and medicines.  Blood or urine tests. These may be used to rule out other causes of your symptoms. How is this treated? The treatment for this condition depends on the severity of your symptoms.  For mild cases, stopping the medicine or drug that caused your condition is usually all that is needed.  For moderate to severe cases, treatment in a hospital may be needed to prevent or manage life-threatening symptoms. This may include medicines to control your symptoms, IV fluids, interventions to support your breathing, and treatments to control your body temperature. Follow these instructions at home: Medicines  Take over-the-counter and prescription medicines only as told by your health care provider. This is important.  Check with your health care provider before you start taking  any new prescriptions, over-the-counter medicines, herbs, or supplements.  Avoid combining any medicines that can cause this condition to occur.   Lifestyle  Maintain a healthy lifestyle. ? Eat a healthy diet that includes plenty of vegetables, fruits, whole  grains, low-fat dairy products, and lean protein. Do not eat a lot of foods that are high in fat, added sugars, or salt. ? Get the right amount and quality of sleep. Most adults need 7-9 hours of sleep each night. ? Make time to exercise, even if it is only for short periods of time. Most adults should exercise for at least 150 minutes each week. ? Do not drink alcohol. ? Do not use illegal drugs, and do not take medicines for reasons other than they are prescribed.   General instructions  Do not use any products that contain nicotine or tobacco, such as cigarettes and e-cigarettes. If you need help quitting, ask your health care provider.  Keep all follow-up visits as told by your health care provider. This is important. Contact a health care provider if:  Your symptoms do not improve or they get worse. Get help right away if you:  Have worsening confusion, severe headache, chest pain, high fever, seizures, or loss of consciousness.  Experience serious side effects of medicine, such as swelling of your face, lips, tongue, or throat.  Have serious thoughts about hurting yourself or others. These symptoms may represent a serious problem that is an emergency. Do not wait to see if the symptoms will go away. Get medical help right away. Call your local emergency services (911 in the [place] Do not drive yourself to the hospital. If you ever feel like you may hurt yourself or others, or have thoughts about taking your own life, get help right away. You can go to your nearest emergency department or call:  Your local emergency services (911 in the [place]  A suicide crisis helpline, such as the [facility] at 650 433 9196. This is open 24 hours a day. Summary  Serotonin is a brain chemical that helps to regulate the nervous system. High levels of serotonin in the body can cause serotonin syndrome, which is a very dangerous condition.  This condition may be caused by  taking medicines or drugs that increase the level of serotonin in your body.  Treatment depends on the severity of your symptoms. For mild cases, stopping the medicine or drug that caused your condition is usually all that is needed.  Check with your health care provider before you start taking any new prescriptions, over-the-counter medicines, herbs, or supplements. This information is not intended to replace advice given to you by your health care provider. Make sure you discuss any questions you have with your health care provider. Document Revised: 04/30/2017 Document Reviewed: 04/30/2017 Elsevier Patient Education  2021 [employer].

## 2020-06-10 NOTE — Assessment & Plan Note (Addendum)
Patient presents for follow up for his diabetes. [name] states that he noticed his sugars going back up on his Lantus 30 units daily, and increased it to Lantus 30 twice daily with a substantial reduction in his sugar levels. He additionally takes metformin 1000 mg twice daily with no side effects. His last A1c was 9.0. - Ordered A1c - Continue Lantus 30 Units twice daily - Continue metformin 1000 mg twice daily   Addendum:  Updated A1c of 7.6, updated patient during interview. Continue current regimen.

## 2020-06-11 ENCOUNTER — Encounter: Payer: Self-pay | Admitting: Internal Medicine

## 2020-06-11 LAB — PSA: Prostate Specific Ag, Serum: 8.3 ng/mL — ABNORMAL HIGH (ref 0.0–4.0)

## 2020-06-11 NOTE — Progress Notes (Signed)
Internal Medicine Clinic Attending ? ?Case discussed with Dr. Winters  At the time of the visit.  We reviewed the resident?s history and exam and pertinent patient test results.  I agree with the assessment, diagnosis, and plan of care documented in the resident?s note.  ?

## 2020-06-11 NOTE — Assessment & Plan Note (Addendum)
[name] presents to the clinic for follow up on his abdominal pain. He did have another episode that lasted 2 weeks mid February. He states that he would have abdominal pain with nausea and vomiting if he tried to eat solid foods. He only had 2 bowel movements during this two week period. He noted that his urine darkened due to inadequate hydration with Gatorade.   He states that he is currently having regular BMs daily now, and his urine is now clear that he is able to take solid foods and fluids.   He is curious as to whether or not his symptoms could be related to stress. His episodes have come during or after major stress events. 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 During his winter episodes his mother passed Christmas Eve of 2021, and the anniversary of his brother and father's death in the January season. He notices that his symptoms are less or resolved during the spring and summer months.   A/P  Patient presents with a recent 2 week bout of abdominal pain, in February 2022. He has had an extensive work up since being established. He has a FHx of GI cancers. His upper endoscopy has was wnl, as was his colonoscopy. When establishing with our clinic he did have uncontrolled diabetes, with concerns of gastroparesis, but his emptying study came back wnl as well. His CT abdomen was additionally negative for any pathology. Given that his symptoms do appear to worsen during stressful life events, his otherwise normal workup may be due to stress. His PHQ-9 today is 10. He is already on Cymbalta for his peripheral neuropathy. Will trial an SSRI at this time.  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 - Serotonin syndrome handout provided.  - Continue Senokot- Docusate daily and Miralax as needed.

## 2020-06-11 NOTE — Assessment & Plan Note (Signed)
PHQ9 today of 10. His depression may be manifesting as GI Distress (See Abdominal Pain).  - Continue Cymbalta 60 mg daily - Started Zoloft 25 mg daily, discussed SS with patient and provided handout.

## 2020-06-12 ENCOUNTER — Other Ambulatory Visit: Payer: Self-pay | Admitting: Internal Medicine

## 2020-06-12 DIAGNOSIS — Z125 Encounter for screening for malignant neoplasm of prostate: Secondary | ICD-10-CM

## 2020-06-13 ENCOUNTER — Other Ambulatory Visit: Payer: Self-pay

## 2020-06-13 DIAGNOSIS — F32A Depression, unspecified: Secondary | ICD-10-CM

## 2020-06-13 DIAGNOSIS — R109 Unspecified abdominal pain: Secondary | ICD-10-CM

## 2020-06-13 NOTE — Telephone Encounter (Signed)
Called pt about Sertraline rx - he stated he has the orange card now and rx needs to be sent to the [facility].

## 2020-06-13 NOTE — Telephone Encounter (Signed)
  sertraline (ZOLOFT) 25 MG tablet, refill request @ [facility].

## 2020-06-14 MED ORDER — SERTRALINE HCL 25 MG PO TABS: 25.0000 mg | ORAL_TABLET | Freq: Every day | ORAL | 2 refills | Status: DC

## 2020-06-18 ENCOUNTER — Other Ambulatory Visit: Payer: Self-pay | Admitting: Student

## 2020-07-05 ENCOUNTER — Telehealth: Payer: Self-pay | Admitting: Internal Medicine

## 2020-07-05 NOTE — Telephone Encounter (Signed)
Patient presents with questions regarding prostate screening, after two of his friends were found to have prostate cancer. Patient has a Hx of enlarged prostate and FHx of prostate cancer in two of his maternal uncles. Given his Hx and PMHx and age, will proceed with screening.  - PSA today  Addendum:  PSA level arrived at 8.3. Updated patient on results. Will have a lab visit in 6-8 weeks time for repeat PSA. If continues to be elevated, will refer to urology for next steps.       Progress Notes by Maudie Mercury, MD at 06/10/2020 1:15 PM  RTC to patient and he was notified of above. RN made appt on 08/06/20 @ 1400 for lab only, repeat PSA for 8 weeks out from [address] . Pt very appreciative.  Will forward to red team to please put future order in for PSA. Thank you, SChaplin, RN,BSN

## 2020-07-05 NOTE — Telephone Encounter (Signed)
Patient would like a call back regarding last office visit with Dr. Gilford Rile on 06/10/2020.  PSA was high on that visit and wanted to check to see what the next step would be.  Please return phone call.

## 2020-07-06 ENCOUNTER — Other Ambulatory Visit (HOSPITAL_COMMUNITY): Payer: Self-pay

## 2020-08-06 ENCOUNTER — Other Ambulatory Visit: Payer: Self-pay | Admitting: Internal Medicine

## 2020-08-06 ENCOUNTER — Ambulatory Visit: Payer: Self-pay | Admitting: Internal Medicine

## 2020-08-06 DIAGNOSIS — Z125 Encounter for screening for malignant neoplasm of prostate: Secondary | ICD-10-CM

## 2020-08-06 NOTE — Progress Notes (Signed)
Lab only visit no charge

## 2020-08-07 LAB — PSA: Prostate Specific Ag, Serum: 6 ng/mL — ABNORMAL HIGH (ref 0.0–4.0)

## 2020-08-13 ENCOUNTER — Other Ambulatory Visit: Payer: Self-pay | Admitting: Internal Medicine

## 2020-08-13 NOTE — Progress Notes (Signed)
No charge, lab only. Not a provider visit.

## 2020-08-15 NOTE — Progress Notes (Signed)
Lab only visit - no provider charge

## 2020-08-29 ENCOUNTER — Ambulatory Visit: Payer: No Typology Code available for payment source

## 2020-08-29 ENCOUNTER — Ambulatory Visit (INDEPENDENT_AMBULATORY_CARE_PROVIDER_SITE_OTHER): Payer: No Typology Code available for payment source | Admitting: Podiatry

## 2020-08-29 ENCOUNTER — Other Ambulatory Visit: Payer: Self-pay

## 2020-08-29 DIAGNOSIS — M14671 Charcot's joint, right ankle and foot: Secondary | ICD-10-CM

## 2020-08-29 DIAGNOSIS — B351 Tinea unguium: Secondary | ICD-10-CM

## 2020-08-29 NOTE — Patient Instructions (Signed)
Diabetes Mellitus and Foot Care Foot care is an important part of your health, especially when you have diabetes. Diabetes may cause you to have problems because of poor blood flow (circulation) to your feet and legs, which can cause your skin to:  Become thinner and drier.  Break more easily.  Heal more slowly.  Peel and crack. You may also have nerve damage (neuropathy) in your legs and feet, causing decreased feeling in them. This means that you may not notice Mcmanus injuries to your feet that could lead to more serious problems. Noticing and addressing any potential problems early is the best way to prevent future foot problems. How to care for your feet Foot hygiene  Wash your feet daily with warm water and mild soap. Do not use hot water. Then, pat your feet and the areas between your toes until they are completely dry. Do not soak your feet as this can dry your skin.  Trim your toenails straight across. Do not dig under them or around the cuticle. File the edges of your nails with an emery board or nail file.  Apply a moisturizing lotion or petroleum jelly to the skin on your feet and to dry, brittle toenails. Use lotion that does not contain alcohol and is unscented. Do not apply lotion between your toes.   Shoes and socks  Wear clean socks or stockings every day. Make sure they are not too tight. Do not wear knee-high stockings since they may decrease blood flow to your legs.  Wear shoes that fit properly and have enough cushioning. Always look in your shoes before you put them on to be sure there are no objects inside.  To break in new shoes, wear them for just a few hours a day. This prevents injuries on your feet. Wounds, scrapes, corns, and calluses  Check your feet daily for blisters, cuts, bruises, sores, and redness. If you cannot see the bottom of your feet, use a mirror or ask someone for help.  Do not cut corns or calluses or try to remove them with medicine.  If you  find a Houseman scrape, cut, or break in the skin on your feet, keep it and the skin around it clean and dry. You may clean these areas with mild soap and water. Do not clean the area with peroxide, alcohol, or iodine.  If you have a wound, scrape, corn, or callus on your foot, look at it several times a day to make sure it is healing and not infected. Check for: ? Redness, swelling, or pain. ? Fluid or blood. ? Warmth. ? Pus or a bad smell.   General tips  Do not cross your legs. This may decrease blood flow to your feet.  Do not use heating pads or hot water bottles on your feet. They may burn your skin. If you have lost feeling in your feet or legs, you may not know this is happening until it is too late.  Protect your feet from hot and cold by wearing shoes, such as at the beach or on hot pavement.  Schedule a complete foot exam at least once a year (annually) or more often if you have foot problems. Report any cuts, sores, or bruises to your health care provider immediately. Where to find more information  American Diabetes Association: www.diabetes.org  Association of Diabetes Care & Education Specialists: www.diabeteseducator.org Contact a health care provider if:  You have a medical condition that increases your risk of infection and   you have any cuts, sores, or bruises on your feet.  You have an injury that is not healing.  You have redness on your legs or feet.  You feel burning or tingling in your legs or feet.  You have pain or cramps in your legs and feet.  Your legs or feet are numb.  Your feet always feel cold.  You have pain around any toenails. Get help right away if:  You have a wound, scrape, corn, or callus on your foot and: ? You have pain, swelling, or redness that gets worse. ? You have fluid or blood coming from the wound, scrape, corn, or callus. ? Your wound, scrape, corn, or callus feels warm to the touch. ? You have pus or a bad smell coming from  the wound, scrape, corn, or callus. ? You have a fever. ? You have a red line going up your leg. Summary  Check your feet every day for blisters, cuts, bruises, sores, and redness.  Apply a moisturizing lotion or petroleum jelly to the skin on your feet and to dry, brittle toenails.  Wear shoes that fit properly and have enough cushioning.  If you have foot problems, report any cuts, sores, or bruises to your health care provider immediately.  Schedule a complete foot exam at least once a year (annually) or more often if you have foot problems. This information is not intended to replace advice given to you by your health care provider. Make sure you discuss any questions you have with your health care provider. Document Revised: 10/12/2019 Document Reviewed: 10/12/2019 Elsevier Patient Education  2021 Elsevier Inc.  

## 2020-09-01 NOTE — Progress Notes (Signed)
Subjective:   Patient ID: [name], male   DOB: 55 y.o.   MRN: [id number]   HPI 55 year old male presents the office today for concerns of thick, discolored toenails that he cannot trim himself as well as for Charcot of the right foot.  He states he does have a history of previous wound on the lateral aspect of the right foot which is healed.  Currently denies any ulcerations.  He is diabetic and last A1c was 7.4 in March 2022.   Review of Systems  All other systems reviewed and are negative.  Past Medical History:  Diagnosis Date  . Anxiety    Panic Atacks  . Arthritis   . Carpal tunnel syndrome   . Constipation   . Depression   . Diabetes mellitus    DMII  . Dysrhythmia    hx tachy hr  . GERD (gastroesophageal reflux disease)    no problem in past few years.  . Hepatitis   . Hyperlipidemia   . Hypertension   . Neuropathy associated with endocrine disorder ([place])   . Seizures ([place])    "stress related" no more since taking Klonopin 2 years- See Neurologist  [facility]  . Shortness of breath dyspnea    with exertion  . Sleep apnea    used a cpap when he was 350lb-does not need one now 260lb  . Wears contact lenses     Past Surgical History:  Procedure Laterality Date  . COLONOSCOPY    . KNEE ARTHROSCOPY W/ MENISCAL REPAIR  2000   left  . LUMBAR LAMINECTOMY  2002     Current Outpatient Medications:  .  amLODipine (NORVASC) 10 MG tablet, Take 1 tablet (10 mg total) by mouth daily., Disp: 90 tablet, Rfl: 3 .  cyclobenzaprine (FLEXERIL) 10 MG tablet, Take 10 mg by mouth 3 (three) times daily as needed., Disp: , Rfl:  .  CYMBALTA 60 MG capsule, TAKE 1 CAPSULE ('60MG'$  TOTAL) BY MOUTH DAILY., Disp: 30 capsule, Rfl: 3 .  gabapentin (NEURONTIN) 300 MG capsule, Take 1 capsule (300 mg total) by mouth 3 (three) times daily., Disp: 360 capsule, Rfl: 3 .  insulin glargine (LANTUS) 100 unit/mL SOPN, Inject 30 Units into the skin daily., Disp: 9 mL, Rfl: 5 .  Insulin  Syringe-Needle U-100 (INSULIN SYRINGE .3CC/31GX5/16") 31G X 5/16" 0.3 ML MISC, The patient is insulin requiring, ICD 10 code E11.9. The uses insulin 2 times per day., Disp: 100 each, Rfl: 3 .  lisinopril (ZESTRIL) 20 MG tablet, Take 1 tablet (20 mg total) by mouth daily., Disp: 30 tablet, Rfl: 11 .  metFORMIN (GLUCOPHAGE) 1000 MG tablet, Take 1 tablet (1,000 mg total) by mouth 2 (two) times daily with a meal., Disp: 180 tablet, Rfl: 3 .  metoCLOPramide (REGLAN) 5 MG tablet, Take 1 tablet (5 mg total) by mouth 3 (three) times daily., Disp: 90 tablet, Rfl: 0 .  metoprolol tartrate (LOPRESSOR) 100 MG tablet, Take 1 tablet (100 mg total) by mouth 2 (two) times daily., Disp: 120 tablet, Rfl: 3 .  omeprazole (PRILOSEC) 20 MG capsule, Take 1 capsule (20 mg total) by mouth daily., Disp: 30 capsule, Rfl: 1 .  rosuvastatin (CRESTOR) 20 MG tablet, Take 2 tablets (40 mg total) by mouth daily., Disp: 360 tablet, Rfl: 1 .  senna-docusate (SENOKOT-S) 8.6-50 MG tablet, TAKE 1 TABLET BY MOUTH AT BEDTIME., Disp: 30 tablet, Rfl: 2 .  sertraline (ZOLOFT) 25 MG tablet, Take 1 tablet (25 mg total) by mouth daily., Disp: 30  tablet, Rfl: 2 .  sertraline (ZOLOFT) 25 MG tablet, TAKE 1 TABLET (25 MG TOTAL) BY MOUTH DAILY., Disp: 30 tablet, Rfl: 2  No Known Allergies        Objective:  Physical Exam  General: AAO x3, NAD  Dermatological: Nails are hypertrophic, dystrophic, brittle, discolored, elongated 10. No surrounding redness or drainage. Tenderness nails 1-5 bilaterally. No open lesions or pre-ulcerative lesions are identified today.  Vascular: Dorsalis Pedis artery and Posterior Tibial artery pedal pulses are 2/4 bilateral with immedate capillary fill time.  There is no pain with calf compression, swelling, warmth, erythema.   Neruologic: Sensation decreased with Thornell Mule monofilament  Musculoskeletal: Chronic Charcot changes present to right foot with midfoot collapse and prominence of the plantar  medial midfoot.  Ankle range of motion intact.  There is no significant erythema or warmth or edema.  Gait: Unassisted, Nonantalgic.      Assessment:   Onychomycosis, Charcot     Plan:  -Treatment options discussed including all alternatives, risks, and complications -Etiology of symptoms were discussed -X-rays obtained reviewed.  Midfoot collapse present.  Chronic Charcot changes noted.  No evidence of acute fracture. -Nails debrided 10 without complications or bleeding. -I measured him from accommodative insert. -Daily foot inspection -Follow-up in 3 months or sooner if any problems arise. In the meantime, encouraged to call the office with any questions, concerns, change in symptoms.   Celesta Gentile, DPM

## 2020-09-03 ENCOUNTER — Telehealth: Payer: Self-pay | Admitting: Podiatry

## 2020-09-03 NOTE — Telephone Encounter (Signed)
Left message for pt to call back that I need his shoe size and type of shoe he is to be wearing to order the orthotics.

## 2020-09-04 ENCOUNTER — Encounter: Payer: Self-pay | Admitting: Internal Medicine

## 2020-09-04 ENCOUNTER — Ambulatory Visit (INDEPENDENT_AMBULATORY_CARE_PROVIDER_SITE_OTHER): Payer: Self-pay | Admitting: Internal Medicine

## 2020-09-04 VITALS — BP 163/101 | HR 62 | Temp 98.6°F | Wt 259.4 lb

## 2020-09-04 DIAGNOSIS — I10 Essential (primary) hypertension: Secondary | ICD-10-CM

## 2020-09-04 DIAGNOSIS — R109 Unspecified abdominal pain: Secondary | ICD-10-CM

## 2020-09-04 DIAGNOSIS — E119 Type 2 diabetes mellitus without complications: Secondary | ICD-10-CM

## 2020-09-04 DIAGNOSIS — E1142 Type 2 diabetes mellitus with diabetic polyneuropathy: Secondary | ICD-10-CM

## 2020-09-04 DIAGNOSIS — E11610 Type 2 diabetes mellitus with diabetic neuropathic arthropathy: Secondary | ICD-10-CM

## 2020-09-04 DIAGNOSIS — F32A Depression, unspecified: Secondary | ICD-10-CM

## 2020-09-04 LAB — POCT GLYCOSYLATED HEMOGLOBIN (HGB A1C): Hemoglobin A1C: 7.1 % — AB (ref 4.0–5.6)

## 2020-09-04 LAB — GLUCOSE, CAPILLARY: Glucose-Capillary: 115 mg/dL — ABNORMAL HIGH (ref 70–99)

## 2020-09-04 MED ORDER — GABAPENTIN 600 MG PO TABS: 600.0000 mg | ORAL_TABLET | Freq: Every day | ORAL | 2 refills | Status: DC

## 2020-09-04 MED ORDER — SERTRALINE HCL 50 MG PO TABS: 50.0000 mg | ORAL_TABLET | Freq: Every day | ORAL | 3 refills | Status: DC

## 2020-09-04 MED ORDER — INSULIN GLARGINE 100 UNITS/ML SOLOSTAR PEN: 30.0000 [IU] | PEN_INJECTOR | Freq: Two times a day (BID) | SUBCUTANEOUS | 5 refills | Status: DC

## 2020-09-04 MED ORDER — LOSARTAN POTASSIUM 25 MG PO TABS: 25.0000 mg | ORAL_TABLET | Freq: Every day | ORAL | 0 refills | Status: DC

## 2020-09-04 NOTE — Patient Instructions (Addendum)
To [name],   It was a pleasure seeing you today! Today we discussed your diabetes regimen, blood pressure, depression/anxiety medications, and diabetes induced neuropathy.   For your diabetes medication, I have discontinued your Metformin, and have changed your daily 30 units of Lantus to two times daily.   For your blood pressure medications, please continue the amlodipine. We will stop your lisinopril, and start you on a  Medication called Cozaar. We will have you come back to the office in 2 weeks for a blood pressure check.   We will also increase your Zoloft today to 50 mg a day instead of 25 mg daily. As we discussed, continue to watch for signs and symptoms of serotonin syndrome. Please discontinue the medication and proceed to the nearest emergency department to be evaluated.   For your neuropathy, we will increase your gabapentin to 600 mg three times daily. Please stop taking the medication if you feel light headed, dizzy, or sedation.   I will see you in 2 weeks time!  Maudie Mercury, MD     Serotonin Syndrome Serotonin is a chemical in your body (neurotransmitter) that helps to control several functions, such as:  Brain and nerve cell function.  Mood and emotions.  Memory.  Eating.  Sleeping.  Sexual activity.  Stress response. Having too much serotonin in your body can cause serotonin syndrome. This condition can be harmful to your brain and nerve cells. This can be a life-threatening condition. What are the causes? This condition may be caused by taking medicines or drugs that increase the level of serotonin in your body, such as:  Antidepressant medicines.  Migraine medicines.  Certain pain medicines.  Certain drugs, including ecstasy, LSD, cocaine, and amphetamines.  Over-the-counter cough or cold medicines that contain dextromethorphan.  Certain herbal supplements, including St. John's wort, ginseng, and nutmeg. This condition usually occurs when  you take these medicines or drugs in combination, but it can also happen with a high dose of a single medicine or drug. What increases the risk? You are more likely to develop this condition if:  You just started taking a medicine or drug that increases the level of serotonin in the body.  You recently increased the dose of a medicine or drug that increases the level of serotonin in the body.  You take more than one medicine or drug that increases the level of serotonin in the body. What are the signs or symptoms? Symptoms of this condition usually start within several hours of taking a medicine or drug. Symptoms may be mild or severe. Mild symptoms include:  Sweating.  Restlessness or agitation.  Muscle twitching or stiffness.  Rapid heart rate.  Nausea and vomiting.  Diarrhea.  Headache.  Shivering or goose bumps.  Confusion. Severe symptoms include:  Irregular heartbeat.  Seizures.  Loss of consciousness.  High fever. How is this diagnosed? This condition may be diagnosed based on:  Your medical history.  A physical exam.  Your prior use of drugs and medicines.  Blood or urine tests. These may be used to rule out other causes of your symptoms. How is this treated? The treatment for this condition depends on the severity of your symptoms.  For mild cases, stopping the medicine or drug that caused your condition is usually all that is needed.  For moderate to severe cases, treatment in a hospital may be needed to prevent or manage life-threatening symptoms. This may include medicines to control your symptoms, IV fluids, interventions to  support your breathing, and treatments to control your body temperature. Follow these instructions at home: Medicines  Take over-the-counter and prescription medicines only as told by your health care provider. This is important.  Check with your health care provider before you start taking any new prescriptions,  over-the-counter medicines, herbs, or supplements.  Avoid combining any medicines that can cause this condition to occur.   Lifestyle  Maintain a healthy lifestyle. ? Eat a healthy diet that includes plenty of vegetables, fruits, whole grains, low-fat dairy products, and lean protein. Do not eat a lot of foods that are high in fat, added sugars, or salt. ? Get the right amount and quality of sleep. Most adults need 7-9 hours of sleep each night. ? Make time to exercise, even if it is only for short periods of time. Most adults should exercise for at least 150 minutes each week. ? Do not drink alcohol. ? Do not use illegal drugs, and do not take medicines for reasons other than they are prescribed.   General instructions  Do not use any products that contain nicotine or tobacco, such as cigarettes and e-cigarettes. If you need help quitting, ask your health care provider.  Keep all follow-up visits as told by your health care provider. This is important. Contact a health care provider if:  Your symptoms do not improve or they get worse. Get help right away if you:  Have worsening confusion, severe headache, chest pain, high fever, seizures, or loss of consciousness.  Experience serious side effects of medicine, such as swelling of your face, lips, tongue, or throat.  Have serious thoughts about hurting yourself or others. These symptoms may represent a serious problem that is an emergency. Do not wait to see if the symptoms will go away. Get medical help right away. Call your local emergency services (911 in the [place] Do not drive yourself to the hospital. If you ever feel like you may hurt yourself or others, or have thoughts about taking your own life, get help right away. You can go to your nearest emergency department or call:  Your local emergency services (911 in the [place]  A suicide crisis helpline, such as the [facility] at 907-261-6046. This is  open 24 hours a day. Summary  Serotonin is a brain chemical that helps to regulate the nervous system. High levels of serotonin in the body can cause serotonin syndrome, which is a very dangerous condition.  This condition may be caused by taking medicines or drugs that increase the level of serotonin in your body.  Treatment depends on the severity of your symptoms. For mild cases, stopping the medicine or drug that caused your condition is usually all that is needed.  Check with your health care provider before you start taking any new prescriptions, over-the-counter medicines, herbs, or supplements. This information is not intended to replace advice given to you by your health care provider. Make sure you discuss any questions you have with your health care provider. Document Revised: 04/30/2017 Document Reviewed: 04/30/2017 Elsevier Patient Education  2021 [employer].

## 2020-09-05 ENCOUNTER — Encounter: Payer: Self-pay | Admitting: Internal Medicine

## 2020-09-05 DIAGNOSIS — E11610 Type 2 diabetes mellitus with diabetic neuropathic arthropathy: Secondary | ICD-10-CM | POA: Insufficient documentation

## 2020-09-05 LAB — BMP8+ANION GAP
Anion Gap: 13 mmol/L (ref 10.0–18.0)
BUN/Creatinine Ratio: 10 (ref 9–20)
BUN: 13 mg/dL (ref 6–24)
CO2: 25 mmol/L (ref 20–29)
Calcium: 8.7 mg/dL (ref 8.7–10.2)
Chloride: 100 mmol/L (ref 96–106)
Creatinine, Ser: 1.3 mg/dL — ABNORMAL HIGH (ref 0.76–1.27)
Glucose: 107 mg/dL — ABNORMAL HIGH (ref 65–99)
Potassium: 4.2 mmol/L (ref 3.5–5.2)
Sodium: 138 mmol/L (ref 134–144)
eGFR: 66 mL/min/{1.73_m2} (ref >59)

## 2020-09-05 NOTE — Assessment & Plan Note (Signed)
Patient following with podiatry, getting fitted for orthotics.

## 2020-09-05 NOTE — Progress Notes (Signed)
   CC: Diabetes   HPI:  [name] is a 55 y.o. M/F, with a PMH noted below, who presents to the clinic For follow up on his Diabetes. To see the management of their acute and chronic conditions, please see the A&P note under the Encounters tab.   Past Medical History:  Diagnosis Date  . Anxiety    Panic Atacks  . Arthritis   . Carpal tunnel syndrome   . Constipation   . Depression   . Diabetes mellitus    DMII  . Dysrhythmia    hx tachy hr  . GERD (gastroesophageal reflux disease)    no problem in past few years.  . Hepatitis   . Hyperlipidemia   . Hypertension   . Neuropathy associated with endocrine disorder ([place])   . Seizures ([place])    "stress related" no more since taking Klonopin 2 years- See Neurologist  [facility]  . Shortness of breath dyspnea    with exertion  . Sleep apnea    used a cpap when he was 350lb-does not need one now 260lb  . Wears contact lenses    Review of Systems:   Review of Systems  Constitutional: Negative for chills, fever, malaise/fatigue and weight loss.  Respiratory: Negative for cough.   Cardiovascular: Positive for leg swelling. Negative for chest pain, palpitations and orthopnea.  Gastrointestinal: Negative for abdominal pain, constipation, diarrhea, nausea and vomiting.     Physical Exam:  Vitals:   09/04/20 1415 09/04/20 1433  BP: (!) 181/103 (!) 163/101  Pulse: 65 62  Temp: 98.6 F (37 C)   TempSrc: Oral   SpO2: 100%   Weight: 259 lb 6.4 oz (117.7 kg)    Physical Exam Constitutional:      General: He is not in acute distress.    Appearance: Normal appearance. He is not ill-appearing, toxic-appearing or diaphoretic.  Cardiovascular:     Rate and Rhythm: Normal rate and regular rhythm.     Pulses: Normal pulses.     Heart sounds: Normal heart sounds. No murmur heard. No friction rub. No gallop.   Pulmonary:     Effort: Pulmonary effort is normal.     Breath sounds: Normal breath sounds. No wheezing, rhonchi or  rales.  Abdominal:     General: Abdomen is flat. Bowel sounds are normal.  Neurological:     Mental Status: He is alert.  Psychiatric:        Mood and Affect: Mood normal.        Behavior: Behavior normal.      Assessment & Plan:   See Encounters Tab for problem based charting.  Patient discussed with Dr. Jimmye Norman

## 2020-09-05 NOTE — Assessment & Plan Note (Signed)
Patient presents to the clinic for follow up on his Depression. He states that he is doing "okay." Since the last time we spoke his significant other is no longer in his life, but endorses that it is a good change. He has intermittently increased his Zoloft to 50 mg daily, with improvement of his symptoms. We discussed serotonin syndrome, and the patient has not noticed any side effects when he increased his dose. Discussed monitoring for symptoms and immediately stopping his medication and seeking care.  - Serotonin Syndrome packet - Cymbalta 60 mg daily - Zoloft increased to 50 mg daily.

## 2020-09-05 NOTE — Assessment & Plan Note (Addendum)
Vitals:   09/04/20 1415 09/04/20 1433  BP: (!) 181/103 (!) 163/101  Pulse: 65 62  Temp: 98.6 F (37 C)   SpO2: 100%    Hypertension uncontrolled at today's visit. Patient endorses discontinuing his Lisinopril  20 mg daily and has been continuing with his metoprolol 100 mg twice daily and amlodipine 10 mg daily. Will start him on an ARB for kidney protection.  - Add Cozaar 25 mg daily - Amlodipine 10 mg daily - Metoprolol 100 mg BID - F/U in 2 weeks for vitals check

## 2020-09-05 NOTE — Assessment & Plan Note (Signed)
Patient presents to the clinic for follow up on his diabetic neuropathy. He states that he is noticing an increase in his neuropathy with his symptoms being exacerbated mostly at night. Will increase his gabapentin. Discussed the adverse side effects of gabapentin and if he experiences dizziness or increased sedation to stop his medication.  - Continue Cymbalta 60 mg daily - Increase Gabapentin to 600 mg TID

## 2020-09-05 NOTE — Assessment & Plan Note (Signed)
Patient presents to the clinic for follow up on his diabetes. He continues to take Lantus 30 units twice daily. He has discontinued his metformin as it was contributing to his abdominal pain. He states that his sugars have been doing well. His A1c today is improved to 7.1 from 7.6 at last visit. From 7.4. Will continue current regimen.  - Lantus 30 units twice daily - Discontinued Metformin - Will space A1c checks to 6 month intervals.

## 2020-09-10 ENCOUNTER — Telehealth: Payer: Self-pay

## 2020-09-10 NOTE — Telephone Encounter (Signed)
Received TC from [place] at the [facility].  Needs to clarify the RX for the Gabapentin.  Per your office note, Gabapentin was increased to '600mg'$  TID, RX was sent over for '600mg'$  daily. Please correct sig and resend to [facility]. Thank you, SChaplin, RN,BSN

## 2020-09-11 MED ORDER — GABAPENTIN 600 MG PO TABS: 600.0000 mg | ORAL_TABLET | Freq: Three times a day (TID) | ORAL | 0 refills | Status: DC

## 2020-09-17 LAB — HM DIABETES EYE EXAM

## 2020-09-18 ENCOUNTER — Encounter: Payer: Self-pay | Admitting: Internal Medicine

## 2020-09-24 ENCOUNTER — Encounter: Payer: Self-pay | Admitting: Internal Medicine

## 2020-09-24 ENCOUNTER — Telehealth: Payer: Self-pay | Admitting: Dietician

## 2020-09-24 ENCOUNTER — Encounter: Payer: Self-pay | Admitting: Dietician

## 2020-09-24 ENCOUNTER — Ambulatory Visit (INDEPENDENT_AMBULATORY_CARE_PROVIDER_SITE_OTHER): Payer: Self-pay | Admitting: Internal Medicine

## 2020-09-24 DIAGNOSIS — I10 Essential (primary) hypertension: Secondary | ICD-10-CM

## 2020-09-24 MED ORDER — AMLODIPINE BESYLATE 10 MG PO TABS: 10.0000 mg | ORAL_TABLET | Freq: Every day | ORAL | 3 refills | Status: DC

## 2020-09-24 MED ORDER — METOPROLOL TARTRATE 100 MG PO TABS: 100.0000 mg | ORAL_TABLET | Freq: Two times a day (BID) | ORAL | 3 refills | Status: DC

## 2020-09-24 MED ORDER — LOSARTAN POTASSIUM 25 MG PO TABS
25.0000 mg | ORAL_TABLET | Freq: Every day | ORAL | 0 refills | Status: DC
Start: 2020-09-24 — End: 2020-10-30

## 2020-09-24 NOTE — Telephone Encounter (Signed)
Last visit confirmed for June 2022. Report was requested

## 2020-09-24 NOTE — Patient Instructions (Addendum)
To Mr. Rosete,   It was great seeing you again! Congratulations on the new job! Today we discussed your blood pressure. I have placed refills for all your blood pressure medications

## 2020-09-24 NOTE — Telephone Encounter (Signed)
Saw an eye doctor who sent him immediately to [facility],  Sherlynn Stalls- he saw him again last week.

## 2020-09-25 NOTE — Assessment & Plan Note (Signed)
Vitals with BMI 09/24/2020 09/24/2020 09/04/2020  Height - - -  Weight - 261 lbs 14 oz -  BMI - - -  Systolic [id number]  Diastolic 95 92 [id number]  Pulse 96 95 62  Blood pressure currently uncontrolled. [name] had a delay in picking up his new medication (Cozaar 25 mg) due to a discrepancy at the pharmacy. In addition to his Cozaar, he is also taking amlodipine 10 mg and metoprolol 100 mg BID. Unfortunately, he has run out of his metoprolol for the past several days.   A/P:  Patient with elevated pressures in the setting of medications running out. Will refill all medications today and have him follow back in 1 month.  - Refill Norvasc 10 mg QD, Metoprolol 100 mg BID, Cozaar 25 mg QD - BP Check 1 month

## 2020-09-25 NOTE — Progress Notes (Signed)
   CC: BP Check  HPI:  [name] a 55 y.o. person, with a PMH noted below, who presents to the clinic for a BP check. To see the management of their acute and chronic conditions, please see the A&P note under the Encounters tab.   Past Medical History:  Diagnosis Date   Anxiety    Panic Atacks   Arthritis    Carpal tunnel syndrome    Constipation    Depression    Diabetes mellitus    DMII   Dysrhythmia    hx tachy hr   GERD (gastroesophageal reflux disease)    no problem in past few years.   Hepatitis    Hyperlipidemia    Hypertension    Neuropathy associated with endocrine disorder ([place])    Seizures ([place])    "stress related" no more since taking Klonopin 2 years- See Neurologist  [facility]   Shortness of breath dyspnea    with exertion   Sleep apnea    used a cpap when he was 350lb-does not need one now 260lb   Wears contact lenses    Review of Systems:   Review of Systems  Constitutional:  Negative for chills, fever and malaise/fatigue.  Eyes:  Negative for blurred vision and double vision.  Respiratory:  Negative for cough.   Cardiovascular:  Negative for chest pain.  Gastrointestinal:  Negative for abdominal pain, constipation, diarrhea, nausea and vomiting.    Physical Exam:  Vitals:   09/24/20 1434 09/24/20 1508  BP: (!) 177/92 (!) 169/95  Pulse: 95 96  Temp: 98.2 F (36.8 C)   TempSrc: Oral   SpO2: 100%   Weight: 261 lb 14.4 oz (118.8 kg)    Physical Exam Constitutional:      General: He is not in acute distress.    Appearance: Normal appearance. He is not ill-appearing or diaphoretic.  Cardiovascular:     Rate and Rhythm: Normal rate and regular rhythm.  Pulmonary:     Effort: Pulmonary effort is normal. No respiratory distress.     Breath sounds: Normal breath sounds.  Skin:    General: Skin is warm and dry.  Neurological:     Mental Status: He is alert and oriented to person, place, and time.  Psychiatric:        Mood and  Affect: Mood normal.        Behavior: Behavior normal.     Assessment & Plan:   See Encounters Tab for problem based charting.  Patient discussed with Dr. Jimmye Norman

## 2020-09-26 ENCOUNTER — Other Ambulatory Visit: Payer: Self-pay

## 2020-09-26 ENCOUNTER — Ambulatory Visit: Payer: No Typology Code available for payment source | Admitting: Podiatry

## 2020-10-08 ENCOUNTER — Encounter: Payer: Self-pay | Admitting: *Deleted

## 2020-10-22 ENCOUNTER — Telehealth: Payer: Self-pay | Admitting: *Deleted

## 2020-10-22 ENCOUNTER — Encounter: Payer: Self-pay | Admitting: Student

## 2020-10-22 NOTE — Telephone Encounter (Signed)
Pt called to cancel his appt today for BP check. Stated he started having dry heaves @ 4 AM this morning which has been going on x 2 yrs. Stated he has not eaten today but plan to try to force down some soup. He's willing to re-schedule the appt - call transferred to front office. Appt scheduled for Tuesday 7/26.

## 2020-10-29 ENCOUNTER — Other Ambulatory Visit: Payer: Self-pay | Admitting: Internal Medicine

## 2020-10-29 ENCOUNTER — Ambulatory Visit (INDEPENDENT_AMBULATORY_CARE_PROVIDER_SITE_OTHER): Payer: Self-pay | Admitting: Student

## 2020-10-29 VITALS — BP 190/99 | HR 88 | Temp 98.6°F | Ht 70.0 in | Wt 268.6 lb

## 2020-10-29 DIAGNOSIS — I10 Essential (primary) hypertension: Secondary | ICD-10-CM

## 2020-10-29 NOTE — Patient Instructions (Signed)
[name], it was a pleasure seeing you today!  Today we discussed: -Blood pressure: I would like for you to continue with the metoprolol '100mg'$  twice daily. If you can tolerate it, amlodipine '5mg'$  could be a good option. Once we check your kidney function today then we can re-assess whether we can re-start the losartan now.  I have ordered the following labs today:  Lab Orders  BMP8+Anion Gap    I have ordered the following medication/changed the following medications:   Stop the following medications: There are no discontinued medications.   Start the following medications: No orders of the defined types were placed in this encounter.    Follow-up:  1 week    Please make sure to arrive 15 minutes prior to your next appointment. If you arrive late, you may be asked to reschedule.   We look forward to seeing you next time. Please call our clinic at [phone] if you have any questions or concerns. The best time to call is Monday-Friday from 9am-4pm, but there is someone available 24/7. If after hours or the weekend, call the main hospital number and ask for the Internal Medicine Resident On-Call. If you need medication refills, please notify your pharmacy one week in advance and they will send [place] a request.  Thank you for letting us take part in your care. Wishing you the best!  Thank you, Sanjuan Dame, MD

## 2020-10-30 LAB — BMP8+ANION GAP
Anion Gap: 14 mmol/L (ref 10.0–18.0)
BUN/Creatinine Ratio: 20 (ref 9–20)
BUN: 22 mg/dL (ref 6–24)
CO2: 26 mmol/L (ref 20–29)
Calcium: 8.7 mg/dL (ref 8.7–10.2)
Chloride: 102 mmol/L (ref 96–106)
Creatinine, Ser: 1.11 mg/dL (ref 0.76–1.27)
Glucose: 82 mg/dL (ref 65–99)
Potassium: 4 mmol/L (ref 3.5–5.2)
Sodium: 142 mmol/L (ref 134–144)
eGFR: 79 mL/min/{1.73_m2} (ref >59)

## 2020-10-30 MED ORDER — AMLODIPINE BESYLATE 5 MG PO TABS: 5.0000 mg | ORAL_TABLET | Freq: Every day | ORAL | 0 refills | Status: DC

## 2020-10-30 MED ORDER — AMLODIPINE BESYLATE 10 MG PO TABS: 5.0000 mg | ORAL_TABLET | Freq: Every day | ORAL | 3 refills | Status: DC

## 2020-10-30 MED ORDER — LOSARTAN POTASSIUM 50 MG PO TABS: 50.0000 mg | ORAL_TABLET | Freq: Every day | ORAL | 2 refills | Status: DC

## 2020-10-30 NOTE — Progress Notes (Signed)
   CC: blood pressure follow-up  HPI:  [name] is a 55 y.o. with medical history as below presenting to [facility] for blood pressure follow-up  Please see problem-based list for further details, assessments, and plans.  Past Medical History:  Diagnosis Date   Anxiety    Panic Atacks   Arthritis    Carpal tunnel syndrome    Constipation    Depression    Diabetes mellitus    DMII   Dysrhythmia    hx tachy hr   GERD (gastroesophageal reflux disease)    no problem in past few years.   Hepatitis    Hyperlipidemia    Hypertension    Neuropathy associated with endocrine disorder ([place])    Seizures ([place])    "stress related" no more since taking Klonopin 2 years- See Neurologist  [facility]   Shortness of breath dyspnea    with exertion   Sleep apnea    used a cpap when he was 350lb-does not need one now 260lb   Wears contact lenses    Review of Systems:  As per HPI  Physical Exam:  Vitals:   10/29/20 1509  BP: (!) 190/99  Pulse: 88  Temp: 98.6 F (37 C)  TempSrc: Oral  SpO2: 100%  Weight: 268 lb 9.6 oz (121.8 kg)  Height: '5\' 10"'$  (1.778 m)   General: Well-developed, well-nourished. No acute distress CV: Regular rate, rhythm. No murmurs, rubs, gallops appreciated. Warm extremities. Pulm: Normal work of breathing on room air. Clear to auscultation bilaterally. MSK: Normal bulk, tone. Bilateral non-pitting edema Neuro: Awake, alert, conversing appropriately Psych: Normal mood, affect, speech.  Assessment & Plan:   See Encounters Tab for problem based charting.  Patient discussed with Dr.  Jimmye Norman

## 2020-10-30 NOTE — Assessment & Plan Note (Addendum)
BP Readings from Last 3 Encounters:  10/29/20 (!) 190/99  09/24/20 (!) 169/95  09/04/20 (!) 163/101   Blood pressure markedly uncontrolled today, 190/99. He is currently asymptomatic, denying chest pain, dyspnea, lightheadedness, syncopal episodes, new pitting edema. He states he ran out of his new blood pressure medication last week and forgot to call in for a refill. In addition, he stopped taking his amlodipine ~3 weeks ago. States that he felt very fatigued while on this medication, to which this resolved after he stopped taking. Mentions that he has been tolerating losartan well without side effects. He did not take any of his blood pressure medications today.  Discussed with Mr. Lietzke that it is very important for him to take his blood pressure medications, as long-term uncontrolled blood pressure can lead to a multitude of various complications, including strokes, heart attacks, and arterial disease. We do not have any recent blood work on Mr. Gear, last BMP roughly two months ago. I discussed him trialing half the amlodipine dose, to which he agreed. Today will check BMP and have him continue metoprolol and amlodipine. Pending renal function will re-start losartan.   - Continue metoprolol '100mg'$  twice daily - Amlodipine '5mg'$  once daily - BMP today - Start losartan '50mg'$  if renal function at baseline - Return to clinic in one week for BP check  ADDENDUM:  Renal function appears to be at baseline. Will have him start losartan '50mg'$ .

## 2020-11-03 NOTE — Progress Notes (Signed)
Internal Medicine Clinic Attending ? ?Case discussed with Dr. Braswell  At the time of the visit.  We reviewed the resident?s history and exam and pertinent patient test results.  I agree with the assessment, diagnosis, and plan of care documented in the resident?s note.  ?

## 2020-11-06 ENCOUNTER — Ambulatory Visit (INDEPENDENT_AMBULATORY_CARE_PROVIDER_SITE_OTHER): Payer: No Typology Code available for payment source | Admitting: Podiatry

## 2020-11-06 ENCOUNTER — Encounter: Payer: Self-pay | Admitting: Internal Medicine

## 2020-11-06 ENCOUNTER — Other Ambulatory Visit: Payer: Self-pay

## 2020-11-06 DIAGNOSIS — M14671 Charcot's joint, right ankle and foot: Secondary | ICD-10-CM

## 2020-11-06 NOTE — Patient Instructions (Signed)

## 2020-11-06 NOTE — Progress Notes (Signed)
Patient presents today for orthotic pick up. Patient voices no new complaints.  Orthotics were fitted to patient's feet. No discomfort and no rubbing. Patient satisfied with the orthotics.  Orthotics were dispensed to patient with instructions for break in wear and to call the office with any concerns or questions. 

## 2020-11-11 ENCOUNTER — Ambulatory Visit: Payer: Self-pay | Attending: Family Medicine | Admitting: Podiatry

## 2020-11-11 ENCOUNTER — Other Ambulatory Visit: Payer: Self-pay

## 2020-11-11 DIAGNOSIS — E1142 Type 2 diabetes mellitus with diabetic polyneuropathy: Secondary | ICD-10-CM

## 2020-11-11 DIAGNOSIS — E119 Type 2 diabetes mellitus without complications: Secondary | ICD-10-CM

## 2020-11-12 NOTE — Progress Notes (Signed)
Patient had seen Dr. Jacqualyn Posey last week for his concern. He stated he will follow up with Dr. Jacqualyn Posey.

## 2020-11-23 ENCOUNTER — Encounter: Payer: Self-pay | Admitting: Podiatry

## 2020-12-11 ENCOUNTER — Telehealth: Payer: Self-pay | Admitting: *Deleted

## 2020-12-11 NOTE — Telephone Encounter (Signed)
Pt's calling to schedule an appt for leg swelling and BP check; front office has schedule an appt tomorrow w/Dr Gilford Rile @ [phone]. Call was transferred to triage when pt c/o sob. Pt stated this has been going on x a few weeks. Stated  he gets sob when walks up stairs and long distances and when he goes to the grocery store he has to use a buggy to lean on. Stated both legs are swollen ,up to his groin and abd.  BP has been as high as 151/101 but average around 140/90. I will ask The Attending if pt can wait until tomorrow or needs to go to the ED today.

## 2020-12-11 NOTE — Telephone Encounter (Signed)
Advised if he has chest pain, or shortness of breath acutely worsens needs to go directly to ED, otherwise this seems like it has slowly progressed and may keep tomorrows appointment.

## 2020-12-11 NOTE — Telephone Encounter (Signed)
Called pt / informed if his symptoms become worse and develop CP to go to ED - stated he understands.

## 2020-12-12 ENCOUNTER — Encounter: Payer: Self-pay | Admitting: Internal Medicine

## 2020-12-25 ENCOUNTER — Other Ambulatory Visit: Payer: Self-pay | Admitting: Internal Medicine

## 2021-01-09 ENCOUNTER — Other Ambulatory Visit: Payer: Self-pay | Admitting: Internal Medicine

## 2021-01-10 ENCOUNTER — Telehealth: Payer: Self-pay

## 2021-01-10 NOTE — Telephone Encounter (Signed)
Patient called in stating he tested positive for Covid on 10/6. Symptoms include "head cold, chest congestion, runny nose, cough, tired and achy." Denies fever, SHOB. Advised plenty of fluids, especially water, plenty of rest, OTC meds (Mucinex, tylenol/ibuprofen) as needed. Instructed to isolate for 5 days from onset of symptoms (10/5) and to wear mask when out for additional 5 days. He is aware to head directly to ED if develops fever > 101.4, or SHOB at rest. He was very appreciative.

## 2021-01-10 NOTE — Telephone Encounter (Signed)
Pt is requesting a call back he stated that he took three at home  covid  test and all came back of positive pt is wanting to know what is the next step for him to do

## 2021-02-04 LAB — HM DIABETES EYE EXAM

## 2021-09-24 ENCOUNTER — Other Ambulatory Visit: Payer: Self-pay

## 2021-09-24 ENCOUNTER — Other Ambulatory Visit (HOSPITAL_COMMUNITY): Payer: Self-pay

## 2021-09-24 ENCOUNTER — Encounter: Payer: Self-pay | Admitting: Student

## 2021-09-24 ENCOUNTER — Ambulatory Visit: Payer: Self-pay | Admitting: Student

## 2021-09-24 VITALS — BP 210/125 | HR 93 | Temp 98.2°F | Ht 70.5 in | Wt 213.7 lb

## 2021-09-24 DIAGNOSIS — I10 Essential (primary) hypertension: Secondary | ICD-10-CM

## 2021-09-24 DIAGNOSIS — Z794 Long term (current) use of insulin: Secondary | ICD-10-CM

## 2021-09-24 DIAGNOSIS — E11610 Type 2 diabetes mellitus with diabetic neuropathic arthropathy: Secondary | ICD-10-CM

## 2021-09-24 DIAGNOSIS — R634 Abnormal weight loss: Secondary | ICD-10-CM

## 2021-09-24 DIAGNOSIS — B182 Chronic viral hepatitis C: Secondary | ICD-10-CM

## 2021-09-24 DIAGNOSIS — F32A Depression, unspecified: Secondary | ICD-10-CM

## 2021-09-24 DIAGNOSIS — E785 Hyperlipidemia, unspecified: Secondary | ICD-10-CM

## 2021-09-24 DIAGNOSIS — E119 Type 2 diabetes mellitus without complications: Secondary | ICD-10-CM

## 2021-09-24 DIAGNOSIS — Z5989 Other problems related to housing and economic circumstances: Secondary | ICD-10-CM

## 2021-09-24 DIAGNOSIS — Z87891 Personal history of nicotine dependence: Secondary | ICD-10-CM

## 2021-09-24 DIAGNOSIS — E1142 Type 2 diabetes mellitus with diabetic polyneuropathy: Secondary | ICD-10-CM

## 2021-09-24 LAB — POCT GLYCOSYLATED HEMOGLOBIN (HGB A1C): Hemoglobin A1C: 7.6 % — AB (ref 4.0–5.6)

## 2021-09-24 LAB — GLUCOSE, CAPILLARY: Glucose-Capillary: 150 mg/dL — ABNORMAL HIGH (ref 70–99)

## 2021-09-24 MED ORDER — LOSARTAN POTASSIUM 25 MG PO TABS
25.0000 mg | ORAL_TABLET | Freq: Every day | ORAL | 11 refills | Status: DC
  Filled 2021-09-24: qty 30, 30d supply, fill #0

## 2021-09-24 MED ORDER — AMLODIPINE BESYLATE 5 MG PO TABS
5.0000 mg | ORAL_TABLET | Freq: Every day | ORAL | 1 refills | Status: DC
  Filled 2021-09-24: qty 30, 30d supply, fill #0

## 2021-09-24 MED ORDER — METFORMIN HCL 500 MG PO TABS
500.0000 mg | ORAL_TABLET | Freq: Every day | ORAL | 11 refills | Status: DC
  Filled 2021-09-24: qty 30, 30d supply, fill #0

## 2021-09-24 NOTE — Progress Notes (Unsigned)
   CC: Follow-up  HPI:  [name] a 56 y.o. male with PMH as below who presents to clinic for follow-up on his chronic medical problems after being lost to follow-up for 1 year. Please see problem based charting for evaluation, assessment and plan.  Past Medical History:  Diagnosis Date   Anxiety    Panic Atacks   Arthritis    Carpal tunnel syndrome    Constipation    Depression    Diabetes mellitus    DMII   Dysrhythmia    hx tachy hr   GERD (gastroesophageal reflux disease)    no problem in past few years.   Hepatitis    Hyperlipidemia    Hypertension    Neuropathy associated with endocrine disorder ([place])    Seizures ([place])    "stress related" no more since taking Klonopin 2 years- See Neurologist  [facility]   Shortness of breath dyspnea    with exertion   Sleep apnea    used a cpap when he was 350lb-does not need one now 260lb   Wears contact lenses     Review of Systems:  Constitutional: Positive for unintentional weight loss, fatigue and loss of appetite Eyes: Negative for visual changes Respiratory: Negative for shortness of breath Cardiac: Negative for chest pain or palpitations MSK: Negative for back pain Abdomen: Positive for abdominal fullness, nausea, vomiting and occasional abdominal pain. Negative constipation or diarrhea Neuro: Negative for headache, numbness, tingling, dizziness or weakness  Physical Exam: General: Pleasant, well-appearing middle-age man.  No acute distress. HEENT: MMM. PERRLA. EOMI. Cardiac: Mildly tachycardic.  Regular rhythm. No murmurs, rubs or gallops. No LE edema Respiratory: Lungs CTAB. No wheezing or crackles. Abdominal: Soft, symmetric and non tender. Normal BS. Skin: Warm, dry and intact without rashes or lesions Extremities: Atraumatic. Full ROM. Palpable radial and DP pulses. Neuro: A&O x 3. Strength 5/5 in all extremities.  Normal grip strength. Normal sensation to gross touch. Normal finger-to-nose. Cranial  nerves II-XII intact Psych: Appropriate affect. Mildly depressed mood while discussing current circumstances.  Vitals:   09/24/21 1500 09/24/21 1502 09/24/21 1601  BP:  (!) 226/122 (!) 210/125  Pulse:  95 93  Temp:  98.2 F (36.8 C)   TempSrc:  Oral   SpO2:  100% 100%  Weight: 213 lb 11.2 oz (96.9 kg)    Height: 5' 10.5" (1.791 m)      Assessment & Plan:   See Encounters Tab for problem based charting.  Patient discussed with Dr.  Thomes Cake, MD, MPH

## 2021-09-24 NOTE — Patient Instructions (Signed)
Thank you, [name] for allowing [place] to provide your care today. Today we discussed your blood pressure, weight loss, depression and diabetes.  Please make sure to complete the orange card application so we can provide other medical care you need.  Your blood pressure was very elevated.  We are starting you back on your blood pressure medications at lower doses.  I want you to follow-up with me early next week for blood pressure check.  For your diabetes, your A1c is 7.6%.  Want you to stop your insulin.  Start metformin 500 mg daily.  I have ordered the following medication/changed the following medications:  Start metformin 500 mg daily Start amlodipine 5 mg daily Start losartan 25 mg daily  My Chart Access: [email or web address]  Please follow-up in 1 week, early next week.  Please make sure to arrive 15 minutes prior to your next appointment. If you arrive late, you may be asked to reschedule.    We look forward to seeing you next time. Please call our clinic at [phone] if you have any questions or concerns. The best time to call is Monday-Friday from 9am-4pm, but there is someone available 24/7. If after hours or the weekend, call the main hospital number and ask for the Internal Medicine Resident On-Call. If you need medication refills, please notify your pharmacy one week in advance and they will send [place] a request.   Thank you for letting us take part in your care. Wishing you the best!  Lacinda Axon, MD 09/24/2021, 4:41 PM IM Resident, PGY-2 [name] 41:10

## 2021-09-25 ENCOUNTER — Other Ambulatory Visit (HOSPITAL_COMMUNITY): Payer: Self-pay

## 2021-09-25 NOTE — Assessment & Plan Note (Addendum)
Patient lost to follow-up for 1 year now here to follow-up on diabetes. Patient's A1c of 7.6% slight increase from 7.1% last year. Patient was previously on Lantus 30 units twice daily. Patient states due to his financial difficulties, he has been only injecting his Lantus when he eats a big meal so his Lantus can last longer. He denies any blurry vision, polyuria or polydipsia. Patient has had decreased appetite and significant weight loss due to poor p.o. intake over the last 6 to 12 months.  With patient's A1c under 8%, discussed discontinuing Lantus and starting metformin.  However patient reported that he did not tolerate metformin in the past and does not plan to go back on it. Also patient reports difficulty affording any medications at this point.  Plan: -Discontinue Lantus -Discontinue new metformin prescription -Consider starting patient on SGLT2 inhibitor once orange card has been approved. He will also need a diabetic foot exam and eye exam when he has coverage.

## 2021-09-27 ENCOUNTER — Encounter: Payer: Self-pay | Admitting: *Deleted

## 2021-09-28 ENCOUNTER — Encounter: Payer: Self-pay | Admitting: Student

## 2021-09-28 DIAGNOSIS — R634 Abnormal weight loss: Secondary | ICD-10-CM | POA: Insufficient documentation

## 2021-09-28 NOTE — Assessment & Plan Note (Signed)
Patient follows with podiatry last year. Discussed getting fitted for orthotics in August 2022. Patient reports occasionally falling due to balance problems. -Consider referral back to podiatry when patient has coverage

## 2021-09-28 NOTE — Assessment & Plan Note (Signed)
Patient will need repeat lipid panel when his orange card has been approved.

## 2021-09-28 NOTE — Assessment & Plan Note (Signed)
Patient with a history of depression that is currently untreated.  Patient was previously on Zoloft and Cymbalta but has not filled these 2 prescriptions in the last 6 months. PHQ-9 is 17 today, with patient checking " several days" for question about "thoughts that you are better off dead or harming yourself in someway".  After discussion with patient about his answer and patient stated that he has no intent or plan to kill himself. States his current circumstances have made him very depressed.  He was evicted from his apartment late last year and has had difficulty with housing since then. He currently has no income and staying with a cousin at the moment. He is unable to afford any of his medications. He plans to reapply for the orange card so he can receive coverage for his medical care.   Plan: -Patient given orange card application -Consider restarting SSRI if patient can afford at next office visit -Referral for CBT when orange card has been approved.

## 2021-09-29 ENCOUNTER — Other Ambulatory Visit (HOSPITAL_COMMUNITY): Payer: Self-pay

## 2021-10-02 ENCOUNTER — Other Ambulatory Visit (HOSPITAL_COMMUNITY): Payer: Self-pay

## 2021-10-02 ENCOUNTER — Encounter: Payer: Self-pay | Admitting: Student

## 2021-10-02 ENCOUNTER — Ambulatory Visit (INDEPENDENT_AMBULATORY_CARE_PROVIDER_SITE_OTHER): Payer: Self-pay | Admitting: Student

## 2021-10-02 DIAGNOSIS — R000 Tachycardia, unspecified: Secondary | ICD-10-CM | POA: Insufficient documentation

## 2021-10-02 DIAGNOSIS — I10 Essential (primary) hypertension: Secondary | ICD-10-CM

## 2021-10-02 DIAGNOSIS — Z87891 Personal history of nicotine dependence: Secondary | ICD-10-CM

## 2021-10-02 MED ORDER — CARVEDILOL 6.25 MG PO TABS
6.2500 mg | ORAL_TABLET | Freq: Two times a day (BID) | ORAL | 5 refills | Status: DC
  Filled 2021-10-02: qty 30, 15d supply, fill #0

## 2021-10-02 MED ORDER — LOSARTAN POTASSIUM 50 MG PO TABS
50.0000 mg | ORAL_TABLET | Freq: Every day | ORAL | 11 refills | Status: DC
  Filled 2021-10-02: qty 30, 30d supply, fill #0

## 2021-10-02 NOTE — Assessment & Plan Note (Addendum)
Patient here for blood pressure follow-up after restarting BP meds. Patient states he was unable to pick up his medications until 3 days ago. Blood pressure remains significantly elevated with SBP in the 190s to 200s. Mild improvement from last week. Patient's remain tachycardic in the 100s to 110s. Discussed with patient about adding carvedilol to his regimen and patient agreeable as he was previously on metoprolol for chronic tachycardia. Finances remains a barrier to providing an optimal care for patient however, patient states he should be able to pick up the Coreg. Vitals:   10/02/21 1342 10/02/21 1418  BP: (!) 208/115 (!) 196/120    Plan: -Increase losartan from 25 mg to 50 mg daily (advised to take 2 of the 25 mg tablets for now until next follow-up) -Continue amlodipine 5 mg daily -Start Coreg 6.25 mg twice daily -Follow-up in 1 week for reevaluation and adjustment to medications -Pending arrange card application approval, check BMP once he has coverage.

## 2021-10-02 NOTE — Patient Instructions (Addendum)
Thank you, Mr.Isador E Releford for allowing [place] to provide your care today. Today, we discussed your blood pressure, heart rate and your orange card application.   Your blood pressure is still elevated so I am making changes to your medications.  I am also starting you a new medication to help with your blood pressure as well as your heart rate.  Please follow-up next week for reevaluation.  I have ordered the following medication/changed the following medications:  Increase losartan from 25 to 50 mg daily Start Coreg 6.25 mg twice daily with meals  My Chart Access: [email or web address]  Please follow-up in 1 weeks  Please make sure to arrive 15 minutes prior to your next appointment. If you arrive late, you may be asked to reschedule.    We look forward to seeing you next time. Please call our clinic at [phone] if you have any questions or concerns. The best time to call is Monday-Friday from 9am-4pm, but there is someone available 24/7. If after hours or the weekend, call the main hospital number and ask for the Internal Medicine Resident On-Call. If you need medication refills, please notify your pharmacy one week in advance and they will send [place] a request.   Thank you for letting us take part in your care. Wishing you the best!  Lacinda Axon, MD 10/02/2021, 2:29 PM IM Resident, PGY-2 [name] 41:10

## 2021-10-02 NOTE — Assessment & Plan Note (Signed)
This seems to be a chronic issue.  Patient reports this has been worked up by cardiology in the past.  He completed a stress test that was unremarkable and was ultimately started on metoprolol. He has been off his metoprolol for the last 6 months. He reports having occasional palpitations especially with mild exertion but denies any visual changes, shortness of breath or dizziness.  Patient's heart rate elevated to 114 today with repeat improved to 103.  Plan to start patient on carvedilol to help control his heart rate and blood pressure.  Plan: -Start carvedilol 6.25 mg twice daily -Follow-up in 1 week for re-evaluation

## 2021-10-02 NOTE — Progress Notes (Signed)
   CC: Blood pressure follow-up  HPI:  [name] a 56 y.o. male with PMH as below who presents to clinic to follow-up on his blood pressure. Please see problem based charting for evaluation, assessment and plan.  Past Medical History:  Diagnosis Date   Anxiety    Panic Atacks   Arthritis    Carpal tunnel syndrome    Constipation    Depression    Diabetes mellitus    DMII   Dysrhythmia    hx tachy hr   GERD (gastroesophageal reflux disease)    no problem in past few years.   Hepatitis    Hyperlipidemia    Hypertension    Neuropathy associated with endocrine disorder ([place])    Seizures ([place])    "stress related" no more since taking Klonopin 2 years- See Neurologist  [facility]   Shortness of breath dyspnea    with exertion   Sleep apnea    used a cpap when he was 350lb-does not need one now 260lb   Wears contact lenses     Review of Systems:  Constitutional: Positive for loss of appetite, fatigue and unintentional weight loss.  Negative for fever or night sweats. Eyes: Negative for visual changes Respiratory: Negative for shortness of breath Cardiac: Positive for occasional palpitation. Negative for chest pain Neuro: Negative for headache, dizziness or weakness  Physical Exam: General: Pleasant, well-appearing middle-age man.  No acute distress. Cardiac: Tachycardic. Regular rhythm. No murmurs, rubs or gallops. No LE edema Respiratory: Lungs CTAB. No wheezing or crackles. Abdominal: Soft, symmetric and non tender. Normal BS. Skin: Warm, dry and intact without rashes or lesions Extremities: Atraumatic. Full ROM. Palpable radial and DP pulses. Neuro: A&O x 3. Moves all extremities.  Normal sensation to gross touch. Psych: Appropriate mood and affect.  Vitals:   10/02/21 1342 10/02/21 1418  BP: (!) 208/115 (!) 196/120  Pulse: (!) 114 (!) 103  Temp: 98.5 F (36.9 C)   TempSrc: Oral   SpO2: 100%   Weight: 211 lb 6.4 oz (95.9 kg)     Assessment &  Plan:   See Encounters Tab for problem based charting.  Patient discussed with Dr. Lockie Pares, MD, MPH

## 2021-10-03 NOTE — Progress Notes (Signed)
Internal Medicine Clinic Attending  Case discussed with Dr. Amponsah  At the time of the visit.  We reviewed the resident's history and exam and pertinent patient test results.  I agree with the assessment, diagnosis, and plan of care documented in the resident's note.  

## 2021-10-10 ENCOUNTER — Encounter: Payer: Self-pay | Admitting: Student

## 2021-10-10 ENCOUNTER — Other Ambulatory Visit (HOSPITAL_COMMUNITY): Payer: Self-pay

## 2021-10-10 ENCOUNTER — Ambulatory Visit (INDEPENDENT_AMBULATORY_CARE_PROVIDER_SITE_OTHER): Payer: Self-pay | Admitting: Student

## 2021-10-10 VITALS — BP 165/103 | HR 79 | Temp 98.3°F | Ht 70.0 in | Wt 211.1 lb

## 2021-10-10 DIAGNOSIS — Z87891 Personal history of nicotine dependence: Secondary | ICD-10-CM

## 2021-10-10 DIAGNOSIS — I10 Essential (primary) hypertension: Secondary | ICD-10-CM

## 2021-10-10 DIAGNOSIS — K5900 Constipation, unspecified: Secondary | ICD-10-CM

## 2021-10-10 DIAGNOSIS — R000 Tachycardia, unspecified: Secondary | ICD-10-CM

## 2021-10-10 DIAGNOSIS — Z5989 Other problems related to housing and economic circumstances: Secondary | ICD-10-CM

## 2021-10-10 MED ORDER — CARVEDILOL 12.5 MG PO TABS
12.5000 mg | ORAL_TABLET | Freq: Two times a day (BID) | ORAL | 11 refills | Status: DC
  Filled 2021-10-10: qty 60, 30d supply, fill #0
  Filled 2021-11-07: qty 60, 30d supply, fill #1

## 2021-10-10 MED ORDER — LOSARTAN POTASSIUM 100 MG PO TABS
100.0000 mg | ORAL_TABLET | Freq: Every day | ORAL | 11 refills | Status: DC
  Filled 2021-10-10: qty 30, 30d supply, fill #0

## 2021-10-10 NOTE — Progress Notes (Signed)
CC: Hypertension follow-up  HPI:  Mr.Blessing E Carp is a 56 y.o. PMH noted below, presents to clinic today for follow-up hypertension.  Please see problem based list for details, assessments, and plan.  Past Medical History:  Diagnosis Date   Anxiety    Panic Atacks   Arthritis    Carpal tunnel syndrome    Constipation    Depression    Diabetes mellitus    DMII   Dysrhythmia    hx tachy hr   GERD (gastroesophageal reflux disease)    no problem in past few years.   Hepatitis    Hyperlipidemia    Hypertension    Neuropathy associated with endocrine disorder ([place])    Seizures ([place])    "stress related" no more since taking Klonopin 2 years- See Neurologist  [facility]   Shortness of breath dyspnea    with exertion   Sleep apnea    used a cpap when he was 350lb-does not need one now 260lb   Wears contact lenses    Review of Systems:  Review of Systems  Constitutional:  Negative for diaphoresis.  Eyes:  Negative for blurred vision.  Respiratory:  Negative for shortness of breath.   Cardiovascular:  Positive for leg swelling. Negative for chest pain and palpitations.  Gastrointestinal:  Positive for constipation and nausea. Negative for vomiting.  Neurological:  Positive for dizziness. Negative for weakness and headaches.     Physical Exam:  Vitals:   10/10/21 0949 10/10/21 1021  BP: (!) 202/101 (!) 165/103  Pulse: 80 79  Temp: 98.3 F (36.8 C)   TempSrc: Oral   SpO2: 100%   Weight: 211 lb 1.6 oz (95.8 kg)   Height: 5\' 10"  (1.778 m)    Physical Exam Constitutional:      General: He is not in acute distress.    Appearance: Normal appearance. He is not ill-appearing or diaphoretic.  Cardiovascular:     Rate and Rhythm: Normal rate and regular rhythm.  Musculoskeletal:        General: No tenderness.     Right lower leg: Edema present.     Left lower leg: Edema present.     Comments: LE: trace edema bilaterally  Neurological:     Mental Status: He is  alert.  Psychiatric:        Mood and Affect: Mood normal.        Behavior: Behavior normal.      Assessment & Plan:   Essential hypertension Today's Vitals   10/10/21 0949 10/10/21 1021  BP: (!) 202/101 (!) 165/103  Pulse: 80 79  Temp: 98.3 F (36.8 C)   TempSrc: Oral   SpO2: 100%   Weight: 211 lb 1.6 oz (95.8 kg)   Height: 5\' 10"  (1.778 m)    Body mass index is 30.29 kg/m.   Patient presents today for follow-up BP check. He is currently on losartan 50 mg, amlodipine 5 mg and carvedilol 6.25 mg twice daily. His BP is still elevated today and he took his medicine last night. Has nausea, dizziness, and leg swelling.  Nausea has been ongoing for several years with no worsening.  He believes the leg swelling is from taking amlodipine.  On exam, trace edema so recommended supportive care at this time. Denies headache, blurry vision, weakness, chest pain, palpitations, SOB.  Plan: -Increase losartan to 100 mg daily -Increase Coreg to 12.5 mg twice daily -Continue amlodipine -Recommended elevating legs at night or using compression socks.  Reassess leg  edema at next visit. -Follow-up in 1 week, will consider adding a fourth agent such as a thiazide. Need BMP to assess his kidney function before starting thiazide.   Tachycardia Today's Vitals   10/10/21 0949 10/10/21 1021  BP: (!) 202/101 (!) 165/103  Pulse: 80 79  Temp: 98.3 F (36.8 C)   TempSrc: Oral   SpO2: 100%   Weight: 211 lb 1.6 oz (95.8 kg)   Height: 5\' 10"  (1.778 m)    Body mass index is 30.29 kg/m.   Patient's pulse today was normal. He reports taking the Coreg and it has helped with the tachycardia and palpitations.  Denies chest pain or palpitations at this time.  Plan: -Increase Coreg to 12.5 mg twice daily to address HTN  Underinsured Patient reports waiting for approval for orange card.  We will address health maintenance and referrals when he receives the card.    Constipation Patient reports  constipation. He states he goes days without a bowel movement. He stays hydrated but has not tried anything to help with the constipation. He has decreased appetite and nausea which could be from the constipation. Denies vomiting.   Given his diabetes, he completed a gastric emptying study which was found to be normal.  EGD and colonoscopy were normal.  Plan: -Recommended over-the-counter Metamucil or MiraLAX to help regulate bowel movements -Reassess at next visit   Patient seen with Dr.  Cain Sieve

## 2021-10-10 NOTE — Assessment & Plan Note (Addendum)
Patient reports constipation. He states he goes days without a bowel movement. He stays hydrated but has not tried anything to help with the constipation. He has decreased appetite and nausea which could be from the constipation. Denies vomiting.   Given his diabetes, he completed a gastric emptying study which was found to be normal.  EGD and colonoscopy were normal.  Plan: -Recommended over-the-counter Metamucil or MiraLAX to help regulate bowel movements -Reassess at next visit

## 2021-10-10 NOTE — Assessment & Plan Note (Signed)
Patient reports waiting for approval for orange card.  We will address health maintenance and referrals when he receives the card.

## 2021-10-10 NOTE — Progress Notes (Signed)
Internal Medicine Clinic Attending  I saw and evaluated the patient.  I personally confirmed the key portions of the history and exam documented by Dr. Markus Jarvis and I reviewed pertinent patient test results.  The assessment, diagnosis, and plan were formulated together and I agree with the documentation in the resident's note.    Patient's blood pressure (2nd check) has come down a little today, but is still well above target. We will increase carvedilol from 6.25mg  BID to 12.5mg  BID and will increase losartan from 50 to 100mg  daily. Continue Amlodipine 5mg  daily. Encouraged him to get a home BP cuff & record home pressures. Will do 1 week follow-up for HTN. I suspect he will still not be at goal. [facility] recommend starting a thiazide diuretic, and patient would be able to afford the ~$40 copay for BMP if we do that.

## 2021-10-10 NOTE — Assessment & Plan Note (Signed)
Today's Vitals   10/10/21 0949 10/10/21 1021  BP: (!) 202/101 (!) 165/103  Pulse: 80 79  Temp: 98.3 F (36.8 C)   TempSrc: Oral   SpO2: 100%   Weight: 211 lb 1.6 oz (95.8 kg)   Height: 5\' 10"  (1.778 m)    Body mass index is 30.29 kg/m.   Patient's pulse today was normal. He reports taking the Coreg and it has helped with the tachycardia and palpitations.  Denies chest pain or palpitations at this time.  Plan: -Increase Coreg to 12.5 mg twice daily to address HTN

## 2021-10-10 NOTE — Patient Instructions (Addendum)
Mr. Kauffman,  It was a pleasure seeing you at the clinic today.  Here is the summary of what we talked about today:  Blood pressure: We will increase your Losartan to 100 mg daily and your Coreg (carvedilol) to 12.5 mg twice a day Constipation: Recommended over-the-counter or generic Metamucil or MiraLAX to help with bowel movements.   Please return to the clinic in 1 week for blood pressure recheck.  Take care, Dr. Markus Jarvis

## 2021-10-10 NOTE — Assessment & Plan Note (Addendum)
Today's Vitals   10/10/21 0949 10/10/21 1021  BP: (!) 202/101 (!) 165/103  Pulse: 80 79  Temp: 98.3 F (36.8 C)   TempSrc: Oral   SpO2: 100%   Weight: 211 lb 1.6 oz (95.8 kg)   Height: 5\' 10"  (1.778 m)    Body mass index is 30.29 kg/m.   Patient presents today for follow-up BP check. He is currently on losartan 50 mg, amlodipine 5 mg and carvedilol 6.25 mg twice daily. His BP is still elevated today and he took his medicine last night. Has nausea, dizziness, and leg swelling.  Nausea has been ongoing for several years with no worsening.  He believes the leg swelling is from taking amlodipine.  On exam, trace edema so recommended supportive care at this time. Denies headache, blurry vision, weakness, chest pain, palpitations, SOB.  Plan: -Increase losartan to 100 mg daily -Increase Coreg to 12.5 mg twice daily -Continue amlodipine -Recommended elevating legs at night or using compression socks.  Reassess leg edema at next visit. -Follow-up in 1 week, will consider adding a fourth agent such as a thiazide. Need BMP to assess his kidney function before starting thiazide.

## 2021-10-17 ENCOUNTER — Ambulatory Visit (INDEPENDENT_AMBULATORY_CARE_PROVIDER_SITE_OTHER): Payer: Self-pay | Admitting: Student

## 2021-10-17 ENCOUNTER — Other Ambulatory Visit (HOSPITAL_COMMUNITY): Payer: Self-pay

## 2021-10-17 ENCOUNTER — Encounter: Payer: Self-pay | Admitting: Student

## 2021-10-17 VITALS — BP 165/99 | HR 72 | Wt 210.4 lb

## 2021-10-17 DIAGNOSIS — E119 Type 2 diabetes mellitus without complications: Secondary | ICD-10-CM

## 2021-10-17 DIAGNOSIS — I10 Essential (primary) hypertension: Secondary | ICD-10-CM

## 2021-10-17 DIAGNOSIS — K5900 Constipation, unspecified: Secondary | ICD-10-CM

## 2021-10-17 DIAGNOSIS — Z87891 Personal history of nicotine dependence: Secondary | ICD-10-CM

## 2021-10-17 DIAGNOSIS — Z794 Long term (current) use of insulin: Secondary | ICD-10-CM

## 2021-10-17 LAB — GLUCOSE, CAPILLARY: Glucose-Capillary: 140 mg/dL — ABNORMAL HIGH (ref 70–99)

## 2021-10-17 MED ORDER — IRBESARTAN-HYDROCHLOROTHIAZIDE 150-12.5 MG PO TABS
2.0000 | ORAL_TABLET | Freq: Every day | ORAL | 3 refills | Status: DC
  Filled 2021-10-17: qty 180, 90d supply, fill #0

## 2021-10-17 NOTE — Assessment & Plan Note (Addendum)
Vitals:   10/17/21 0955 10/17/21 1016  BP: (!) 229/103 (!) 165/99  Pulse: 78 72  SpO2: 100%    Patient presents today for follow-up BP.  He is on losartan 100 mg, carvedilol 12.5 mg twice daily, amlodipine 5 mg. BP is still elevated today. He took his carvedilol and losartan this morning, amlodipine last night. Endorses chronic daily nausea, blurry vision and fatigue. Denies headache, vomiting, chest pain, SOB, weakness.   Plan -Continue carvedilol and amlodipine -DC losartan, start irbesartan-HCTZ 300-25 mg daily -f/u in 1 week for recheck -BMP at future visit, 10/2020 BMP was normal

## 2021-10-17 NOTE — Assessment & Plan Note (Addendum)
A1c was 7.6 in June. He is not taking the Lantus regularly. Only takes it with big meals. Denies any polyuria or polydipsia. Decreased appetite still. Difficulty affording medication. Plan to discuss more at next visit about medication.  Foot exam: skin is warm and dry, 2+ dorsalis pedis pulse. Charcot foot noted. Deformity on Left big toe.   Plan -re-evaluate medications at next visit -repeat A1c in 2 months

## 2021-10-17 NOTE — Patient Instructions (Signed)
Thank you, Mr.Jos E Giangregorio for allowing [place] to provide your care today. Today we discussed:  Blood Pressure -stop losartan -start irbesartan-HCTZ: take 2 tablets daily -Follow-up in 1 week  Constipation -Continue to monitor and try fiber supplements if needed  Diabetes -Reevaluate at next visit  I have ordered the following labs for you:  Lab Orders         Glucose, capillary       I have ordered the following medication/changed the following medications:   Stop the following medications: Medications Discontinued During This Encounter  Medication Reason   losartan (COZAAR) 100 MG tablet      Start the following medications: Meds ordered this encounter  Medications   irbesartan-hydrochlorothiazide (AVALIDE) 150-12.5 MG tablet    Sig: Take 2 tablets by mouth daily.    Dispense:  180 tablet    Refill:  3    IM Program     Follow up:  1 week     Should you have any questions or concerns please call the internal medicine clinic at [phone].    Angelique Blonder, D.O. [place]

## 2021-10-17 NOTE — Assessment & Plan Note (Signed)
Patient reports having 2 bowel movements since last visit 7 days ago. Denies melena or hematochezia. Has not tried adding more fiber or taking MiraLAX. He denies any straining during the 2 bowel movements. Still having nausea but no vomiting.   Plan -monitor, recommended staying hydrated or taking OTC fiber or MiraLAX if constipated

## 2021-10-17 NOTE — Progress Notes (Addendum)
CC: HTN follow-up  HPI:  [name] a 56 y.o. male living with a history stated below and presents today for hypertension follow-up. Please see problem based assessment and plan for additional details.  Past Medical History:  Diagnosis Date   Anxiety    Panic Atacks   Arthritis    Carpal tunnel syndrome    Constipation    Depression    Diabetes mellitus    DMII   Dysrhythmia    hx tachy hr   GERD (gastroesophageal reflux disease)    no problem in past few years.   Hepatitis    Hyperlipidemia    Hypertension    Neuropathy associated with endocrine disorder ([place])    Seizures ([place])    "stress related" no more since taking Klonopin 2 years- See Neurologist  [facility]   Shortness of breath dyspnea    with exertion   Sleep apnea    used a cpap when he was 350lb-does not need one now 260lb   Wears contact lenses     Current Outpatient Medications on File Prior to Visit  Medication Sig Dispense Refill   amLODipine (NORVASC) 5 MG tablet Take 1 tablet (5 mg total) by mouth daily. 30 tablet 1   carvedilol (COREG) 12.5 MG tablet Take 1 tablet (12.5 mg total) by mouth 2 (two) times daily with a meal. 60 tablet 11   Insulin Syringe-Needle U-100 (INSULIN SYRINGE .3CC/31GX5/16") 31G X 5/16" 0.3 ML MISC The patient is insulin requiring, ICD 10 code E11.9. The uses insulin 2 times per day. 100 each 3   No current facility-administered medications on file prior to visit.    Review of Systems: ROS negative except for what is noted on the assessment and plan.  Vitals:   10/17/21 0955 10/17/21 1016  BP: (!) 229/103 (!) 165/99  Pulse: 78 72  SpO2: 100%   Weight: 210 lb 6.4 oz (95.4 kg)    Physical Exam Constitutional:      General: He is not in acute distress.    Appearance: Normal appearance. He is not ill-appearing or diaphoretic.  HENT:     Head: Normocephalic and atraumatic.  Cardiovascular:     Rate and Rhythm: Normal rate and regular rhythm.     Pulses:           Radial pulses are 2+ on the right side and 2+ on the left side.       Dorsalis pedis pulses are 2+ on the right side and 2+ on the left side.  Musculoskeletal:     Right foot: Charcot foot present.     Left foot: Deformity present.  Feet:     Comments: Left Foot: deformity on big toe Skin:    General: Skin is warm and dry.  Neurological:     Mental Status: He is alert and oriented to person, place, and time.  Psychiatric:        Mood and Affect: Mood normal.        Behavior: Behavior normal.      Assessment & Plan:   Essential hypertension Vitals:   10/17/21 0955 10/17/21 1016  BP: (!) 229/103 (!) 165/99  Pulse: 78 72  SpO2: 100%    Patient presents today for follow-up BP.  He is on losartan 100 mg, carvedilol 12.5 mg twice daily, amlodipine 5 mg. BP is still elevated today. He took his carvedilol and losartan this morning, amlodipine last night. Endorses chronic daily nausea, blurry vision and fatigue. Denies  headache, vomiting, chest pain, SOB, weakness.   Plan -Continue carvedilol and amlodipine -DC losartan, start irbesartan-HCTZ 300-25 mg daily -f/u in 1 week for recheck -BMP at future visit, 10/2020 BMP was normal  Diabetes ([place]) A1c was 7.6 in June. He is not taking the Lantus regularly. Only takes it with big meals. Denies any polyuria or polydipsia. Decreased appetite still. Difficulty affording medication. Plan to discuss more at next visit about medication.  Foot exam: skin is warm and dry, 2+ dorsalis pedis pulse. Charcot foot noted. Deformity on Left big toe.   Plan -re-evaluate medications at next visit -repeat A1c in 2 months  Constipation Patient reports having 2 bowel movements since last visit 7 days ago. Denies melena or hematochezia. Has not tried adding more fiber or taking MiraLAX. He denies any straining during the 2 bowel movements. Still having nausea but no vomiting.   Plan -monitor, recommended staying hydrated or taking OTC fiber or  MiraLAX if constipated    Patient seen with Dr. Alden Hipp, D.O. [facility], PGY-1 Phone: [phone] Date 10/17/2021 Time 5:33 PM

## 2021-10-22 NOTE — Progress Notes (Signed)
Internal Medicine Clinic Attending  I saw and evaluated the patient.  I personally confirmed the key portions of the history and exam documented by the resident  and I reviewed pertinent patient test results.  The assessment, diagnosis, and plan were formulated together and I agree with the documentation in the resident's note.  

## 2021-10-24 ENCOUNTER — Ambulatory Visit (INDEPENDENT_AMBULATORY_CARE_PROVIDER_SITE_OTHER): Payer: Self-pay | Admitting: Student

## 2021-10-24 ENCOUNTER — Other Ambulatory Visit (HOSPITAL_COMMUNITY): Payer: Self-pay

## 2021-10-24 ENCOUNTER — Encounter: Payer: Self-pay | Admitting: Student

## 2021-10-24 VITALS — BP 168/107 | HR 70 | Temp 98.2°F | Wt 214.0 lb

## 2021-10-24 DIAGNOSIS — E119 Type 2 diabetes mellitus without complications: Secondary | ICD-10-CM

## 2021-10-24 DIAGNOSIS — Z87891 Personal history of nicotine dependence: Secondary | ICD-10-CM

## 2021-10-24 DIAGNOSIS — E785 Hyperlipidemia, unspecified: Secondary | ICD-10-CM

## 2021-10-24 DIAGNOSIS — I10 Essential (primary) hypertension: Secondary | ICD-10-CM

## 2021-10-24 DIAGNOSIS — Z794 Long term (current) use of insulin: Secondary | ICD-10-CM

## 2021-10-24 MED ORDER — PEN NEEDLES 32G X 4 MM MISC
1.0000 | Freq: Every day | 0 refills | Status: DC
  Filled 2021-10-24: qty 100, 90d supply, fill #0

## 2021-10-24 MED ORDER — AMLODIPINE BESYLATE 10 MG PO TABS
10.0000 mg | ORAL_TABLET | Freq: Every day | ORAL | 11 refills | Status: DC
  Filled 2021-10-24: qty 30, 30d supply, fill #0

## 2021-10-24 MED ORDER — ROSUVASTATIN CALCIUM 20 MG PO TABS
20.0000 mg | ORAL_TABLET | Freq: Every day | ORAL | 2 refills | Status: DC
  Filled 2021-10-24: qty 30, 30d supply, fill #0

## 2021-10-24 MED ORDER — LIRAGLUTIDE 18 MG/3ML ~~LOC~~ SOPN
1.2000 mg | PEN_INJECTOR | Freq: Every day | SUBCUTANEOUS | 0 refills | Status: DC
  Filled 2021-10-24: qty 6, 30d supply, fill #0

## 2021-10-24 NOTE — Patient Instructions (Addendum)
Thank you, Mr.Daniela E Cheever for allowing [place] to provide your care today. Today we discussed blood pressure, diabetes and cholesterol.  Blood Pressure -continue carvedilol and irbesartan-HCTZ -Increase amlodipine to 10 mg daily -Blood work today  Diabetes -Stop your Lantus -Set up your meter and check your blood sugar 2-3 times a day -Start Victoza: Inject 0.6 mg into skin daily for 7 days then go up to 1.2 mg injection daily.  -Contact the clinic if you develop low blood sugar  Cholesterol -start Crestor 20 mg daily -We will check cholesterol panel when orange card is approved.   I have ordered the following labs for you:   Lab Orders         BMP8+Anion Gap       Referrals ordered today:   Referral Orders  No referral(s) requested today     I have ordered the following medication/changed the following medications:   Stop the following medications: There are no discontinued medications.   Start the following medications: Meds ordered this encounter  Medications   amLODipine (NORVASC) 10 MG tablet    Sig: Take 1 tablet (10 mg total) by mouth daily.    Dispense:  30 tablet    Refill:  11    IM program   liraglutide (VICTOZA) 18 MG/3ML SOPN    Sig: Inject 1.2 mg into the skin daily. Inject 0.6 mg into the skin daily for 7 days. Then inject 1.2 mg into the skin daily.    Dispense:  6 mL    Refill:  0    IM program   rosuvastatin (CRESTOR) 20 MG tablet    Sig: Take 1 tablet (20 mg total) by mouth daily.    Dispense:  30 tablet    Refill:  2    IM program     Follow up:  2 weeks     Should you have any questions or concerns please call the internal medicine clinic at [phone].    Angelique Blonder, D.O. [place]

## 2021-10-24 NOTE — Assessment & Plan Note (Addendum)
A1c 7.6 in June. He is taking Lantus periodically. Still having some lack of appetite. Denies polyuria and polydipsia. No hypoglycemic symptoms. He reports getting a new glucometer and plans to check his glucose. Patient agrees to check in AM, after largest meal and bedtime. Will review readings at next visit and assess if he need other diabetic resources.   Plan -Stop Lantus -Start Victoza 0.6 mg daily for 7 days then increase to 1.2 mg daily (IM program) -Set up glucometer and bring it to next visit -f/u in 2 weeks

## 2021-10-24 NOTE — Progress Notes (Signed)
CC: F/u HTN, DM2, HLD  HPI:  [name] is a 56 y.o. male living with a history stated below and presents today for follow-up for HTN, DM2 and HLD. Please see problem based assessment and plan for additional details.  Past Medical History:  Diagnosis Date   Anxiety    Panic Atacks   Arthritis    Carpal tunnel syndrome    Constipation    Depression    Diabetes mellitus    DMII   Dysrhythmia    hx tachy hr   GERD (gastroesophageal reflux disease)    no problem in past few years.   Hepatitis    Hyperlipidemia    Hypertension    Neuropathy associated with endocrine disorder ([place])    Seizures ([place])    "stress related" no more since taking Klonopin 2 years- See Neurologist  [facility]   Shortness of breath dyspnea    with exertion   Sleep apnea    used a cpap when he was 350lb-does not need one now 260lb   Wears contact lenses     Current Outpatient Medications on File Prior to Visit  Medication Sig Dispense Refill   amLODipine (NORVASC) 5 MG tablet Take 1 tablet (5 mg total) by mouth daily. 30 tablet 1   carvedilol (COREG) 12.5 MG tablet Take 1 tablet (12.5 mg total) by mouth 2 (two) times daily with a meal. 60 tablet 11   Insulin Syringe-Needle U-100 (INSULIN SYRINGE .3CC/31GX5/16") 31G X 5/16" 0.3 ML MISC The patient is insulin requiring, ICD 10 code E11.9. The uses insulin 2 times per day. 100 each 3   irbesartan-hydrochlorothiazide (AVALIDE) 150-12.5 MG tablet Take 2 tablets by mouth daily. 180 tablet 3   No current facility-administered medications on file prior to visit.    Review of Systems: ROS negative except for what is noted on the assessment and plan.  Vitals:   10/24/21 0911 10/24/21 0945  BP: (!) 175/108 (!) 168/107  Pulse: 70 70  Temp: 98.2 F (36.8 C)   TempSrc: Oral   SpO2: 100%   Weight: 214 lb (97.1 kg)    Physical Exam: Constitutional: well-appearing, non-diaphoretic, in no acute distress HENT: normocephalic atraumatic Neck:  supple Cardiovascular: regular rate and rhythm Pulmonary/Chest: normal work of breathing on room air, lungs clear to auscultation bilaterally Neurological: alert & oriented x 3 Skin: warm and dry Psych: Normal mood and behavior  Assessment & Plan:   Essential hypertension Vitals:   10/24/21 0911 10/24/21 0945  BP: (!) 175/108 (!) 168/107  Pulse: 70 70  Temp: 98.2 F (36.8 C)   SpO2: 100%    Patient presents today for BP follow-up.  He is on carvedilol 12.5 mg BID, amlodipine 5 mg, irbesartan-HCTZ 300-25 mg. He reports taking his meds this morning. BP remains elevated today. Endorses chronic nausea. Denies headache, blurry vision, vomiting, cp, SOB or weakness. Will increase amlodipine to see if this helps. If not, we will need to consider other etiologies causing resistant HTN such as primary hyperaldosteronism.  Plan -Increase amlodipine to 10 mg daily -Continue irbesartan-HCTZ and carvedilol -BMP today -f/u in 2 weeks   Diabetes ([place]) A1c 7.6 in June. He is taking Lantus periodically. Still having some lack of appetite. Denies polyuria and polydipsia. No hypoglycemic symptoms. He reports getting a new glucometer and plans to check his glucose. Patient agrees to check in AM, after largest meal and bedtime. Will review readings at next visit and assess if he need other diabetic resources.  Plan -Stop Lantus -Start Victoza 0.6 mg daily for 7 days then increase to 1.2 mg daily (IM program) -Set up glucometer and bring it to next visit -f/u in 2 weeks  Hyperlipidemia Lipid panel was done in 2021, significant for Cholesterol 290, LDL 200 and TCGs 182. He is not on statin therapy. Previously on Crestor but stopped due to finances and pill burden. Given he has DM2, moderate intensity statin is recommended. Discussed restarting statin therapy to reduce risk and patient agrees.   Plan -Start Crestor 20 mg daily (IM program) -Needs lipid panel when he gets his orange card   Patient  seen with Dr. Tacy Learn, D.O. [facility], PGY-1 Phone: [phone] Date 10/24/2021 Time 3:51 PM

## 2021-10-24 NOTE — Assessment & Plan Note (Addendum)
Vitals:   10/24/21 0911 10/24/21 0945  BP: (!) 175/108 (!) 168/107  Pulse: 70 70  Temp: 98.2 F (36.8 C)   SpO2: 100%    Patient presents today for BP follow-up.  He is on carvedilol 12.5 mg BID, amlodipine 5 mg, irbesartan-HCTZ 300-25 mg. He reports taking his meds this morning. BP remains elevated today. Endorses chronic nausea. Denies headache, blurry vision, vomiting, cp, SOB or weakness. Will increase amlodipine to see if this helps. If not, we will need to consider other etiologies causing resistant HTN such as primary hyperaldosteronism.  Plan -Increase amlodipine to 10 mg daily -Continue irbesartan-HCTZ and carvedilol -BMP today -f/u in 2 weeks

## 2021-10-24 NOTE — Assessment & Plan Note (Signed)
Lipid panel was done in 2021, significant for Cholesterol 290, LDL 200 and TCGs 182. He is not on statin therapy. Previously on Crestor but stopped due to finances and pill burden. Given he has DM2, moderate intensity statin is recommended. Discussed restarting statin therapy to reduce risk and patient agrees.   Plan -Start Crestor 20 mg daily (IM program) -Needs lipid panel when he gets his orange card

## 2021-10-25 LAB — BMP8+ANION GAP
Anion Gap: 15 mmol/L (ref 10.0–18.0)
BUN/Creatinine Ratio: 15 (ref 9–20)
BUN: 45 mg/dL — ABNORMAL HIGH (ref 6–24)
CO2: 20 mmol/L (ref 20–29)
Calcium: 8.7 mg/dL (ref 8.7–10.2)
Chloride: 103 mmol/L (ref 96–106)
Creatinine, Ser: 3.01 mg/dL — ABNORMAL HIGH (ref 0.76–1.27)
Glucose: 156 mg/dL — ABNORMAL HIGH (ref 70–99)
Potassium: 4.5 mmol/L (ref 3.5–5.2)
Sodium: 138 mmol/L (ref 134–144)
eGFR: 24 mL/min/{1.73_m2} — ABNORMAL LOW (ref >59)

## 2021-10-28 ENCOUNTER — Encounter: Payer: Self-pay | Admitting: Dietician

## 2021-10-29 ENCOUNTER — Ambulatory Visit (INDEPENDENT_AMBULATORY_CARE_PROVIDER_SITE_OTHER): Payer: Self-pay | Admitting: Student

## 2021-10-29 ENCOUNTER — Encounter: Payer: Self-pay | Admitting: Student

## 2021-10-29 ENCOUNTER — Other Ambulatory Visit: Payer: Self-pay

## 2021-10-29 ENCOUNTER — Other Ambulatory Visit (HOSPITAL_COMMUNITY): Payer: Self-pay

## 2021-10-29 VITALS — BP 232/114 | HR 77 | Temp 98.1°F | Ht 70.0 in | Wt 211.4 lb

## 2021-10-29 DIAGNOSIS — N179 Acute kidney failure, unspecified: Secondary | ICD-10-CM

## 2021-10-29 DIAGNOSIS — I10 Essential (primary) hypertension: Secondary | ICD-10-CM

## 2021-10-29 DIAGNOSIS — Z87891 Personal history of nicotine dependence: Secondary | ICD-10-CM

## 2021-10-29 DIAGNOSIS — E119 Type 2 diabetes mellitus without complications: Secondary | ICD-10-CM

## 2021-10-29 MED ORDER — HYDRALAZINE HCL 25 MG PO TABS
25.0000 mg | ORAL_TABLET | Freq: Three times a day (TID) | ORAL | 11 refills | Status: DC
  Filled 2021-10-29: qty 90, 30d supply, fill #0

## 2021-10-29 MED ORDER — BLOOD GLUCOSE MONITOR KIT
PACK | 0 refills | Status: DC
  Filled 2021-10-29: qty 1, 30d supply, fill #0

## 2021-10-29 MED ORDER — GLUCOSE BLOOD VI STRP
ORAL_STRIP | 0 refills | Status: DC
  Filled 2021-10-29: qty 100, 33d supply, fill #0

## 2021-10-29 MED ORDER — ONETOUCH DELICA PLUS LANCET33G MISC
1.0000 | Freq: Three times a day (TID) | 0 refills | Status: DC
  Filled 2021-10-29: qty 100, 33d supply, fill #0

## 2021-10-29 NOTE — Patient Instructions (Signed)
Thank you, [name] for allowing [place] to provide your care today. Today we discussed blood pressure and kidney function.    -Start Hydralazine 25 mg three times a day  -Hold your irbesartan-HCTZ and amlodipine -Continue your carvedilol   -sent glucometer and supplies to Outpatient pharmacy -blood work today, I will call with results.  I have ordered the following labs for you:   Lab Orders         BMP8+Anion Gap       Referrals ordered today:   Referral Orders  No referral(s) requested today     I have ordered the following medication/changed the following medications:   Stop the following medications: Medications Discontinued During This Encounter  Medication Reason   amLODipine (NORVASC) 10 MG tablet    amLODipine (NORVASC) 5 MG tablet      Start the following medications: Meds ordered this encounter  Medications   hydrALAZINE (APRESOLINE) 25 MG tablet    Sig: Take 1 tablet (25 mg total) by mouth 3 (three) times daily.    Dispense:  90 tablet    Refill:  11    IM program   blood glucose meter kit and supplies KIT    Sig: Dispense based on patient and insurance preference. Please check your blood sugar three times a day.    Dispense:  1 each    Refill:  0    IM program    Order Specific Question:   Number of strips    Answer:   100    Order Specific Question:   Number of lancets    Answer:   100     Follow up:  1 week     Should you have any questions or concerns please call the internal medicine clinic at [phone].    Angelique Blonder, D.O. [place]

## 2021-10-29 NOTE — Progress Notes (Signed)
Internal Medicine Clinic Attending  I saw and evaluated the patient.  I personally confirmed the key portions of the history and exam documented by Dr. Markus Jarvis and I reviewed pertinent patient test results.  The assessment, diagnosis, and plan were formulated together and I agree with the documentation in the resident's note. Inconsistent use of insulin and not checking BG, recommended stopping insulin use and will start on injectable GLP1-RA through IM program.

## 2021-10-29 NOTE — Progress Notes (Addendum)
CC: HTN follow-up  HPI:  [name] is a 56 y.o. male living with a history stated below and presents today for follow-up HTN. Please see problem based assessment and plan for additional details.  Past Medical History:  Diagnosis Date   Anxiety    Panic Atacks   Arthritis    Carpal tunnel syndrome    Constipation    Depression    Diabetes mellitus    DMII   Dysrhythmia    hx tachy hr   GERD (gastroesophageal reflux disease)    no problem in past few years.   Hepatitis    Hyperlipidemia    Hypertension    Neuropathy associated with endocrine disorder (HCC)    Seizures (HCC)    "stress related" no more since taking Klonopin 2 years- See Neurologist  [facility]   Shortness of breath dyspnea    with exertion   Sleep apnea    used a cpap when he was 350lb-does not need one now 260lb   Wears contact lenses     Current Outpatient Medications on File Prior to Visit  Medication Sig Dispense Refill   carvedilol (COREG) 12.5 MG tablet Take 1 tablet (12.5 mg total) by mouth 2 (two) times daily with a meal. 60 tablet 11   Insulin Pen Needle (PEN NEEDLES) 32G X 4 MM MISC Use as directed with Victoza daily 100 each 0   Insulin Syringe-Needle U-100 (INSULIN SYRINGE .3CC/31GX5/16") 31G X 5/16" 0.3 ML MISC The patient is insulin requiring, ICD 10 code E11.9. The uses insulin 2 times per day. 100 each 3   irbesartan-hydrochlorothiazide (AVALIDE) 150-12.5 MG tablet Take 2 tablets by mouth daily. 180 tablet 3   liraglutide (VICTOZA) 18 MG/3ML SOPN Inject 0.6 mg into the skin daily for 7 days. Then inject 1.2 mg into the skin daily. 6 mL 0   rosuvastatin (CRESTOR) 20 MG tablet Take 1 tablet (20 mg total) by mouth daily. 30 tablet 2   No current facility-administered medications on file prior to visit.   Review of Systems: ROS negative except for what is noted on the assessment and plan.  Vitals:   10/29/21 1334 10/29/21 1501  BP: (!) 186/110 (!) 232/114  Pulse: 84 77  Temp:  98.1 F (36.7 C)   TempSrc: Oral   SpO2: 100%   Weight: 211 lb 6.4 oz (95.9 kg)   Height: 5\' 10"  (1.778 m)    Physical Exam: Constitutional: well-appearing, non-diaphoretic, in no acute distress HENT: normocephalic atraumatic Neck: supple Cardiovascular: regular rate and rhythm Pulmonary/Chest: normal work of breathing on room air, lungs clear to auscultation bilaterally MSK: Lumbar Spine: no swelling, tenderness on right side, normal ROM Neurological: alert & oriented x 3 Skin: warm and dry Psych: normal mood and behavior  Assessment & Plan:   Essential hypertension Vitals:   10/29/21 1334 10/29/21 1501  BP: (!) 186/110 (!) 232/114  Pulse: 84 77  Temp: 98.1 F (36.7 C)   SpO2: 100%    BP remains elevated at this visit. BMP last week showed AKI, Creat 3.01. Creatinine was 1.11 in 10/2020 but that was before he lost insurance and stopped his medications. Restarted meds this June. Has held his irbesartan-HCTZ since then. Not taking his amlodipine because of side effects. Only taking carvedilol 12.5 mg BID. Endorses chronic nausea, fatigue and back pain. Denies headache, chest pain, SOB, blurry vision, numbness.   Possibly some underlying kidney disease given his history of HTN an DM2. Plan to repeat BMP and  adjust his HTN meds. Possibly need further workup but issue right now is insurance. Patient is waiting for orange card approval, he states he will call them today for update.   Plan -repeat BMP today -start Hydralazine 25 mg TID -hold irbesartan-HCTZ  -continue carvedilol -stop amlodipine per patient request -f/u in 1 week for recheck -further workup at future visit (nephrology referral, UA, urine microalbumin) when he receives his orange card  ADDENDUM 7/27: Called pt and made aware of results. Creatinine down to 2.9 but will need further workup. He is calling the orange card office tomorrow to check on application status. We will work with him and see about setting up  nephrology referral and additional lab work (ua, renal u/s, spot protein to creatinine ratio).  Diabetes [facility]) Patient started Victoza 0.6 mg daily on 7/24. Tolerating it well. No hypoglycemic symptoms. He was unable to use the glucometer he bought at the store. Will send in meter and supplies to outpatient pharmacy.   Plan -titrate up to 1.2 mg daily  -sent in glucometer and supplies to pharmacy  AKI (acute kidney injury) [facility]) Please see "Essential Hypertension" under problem-based list.   Patient seen with Dr. Rollene Fare, D.O. [facility] Internal Medicine, PGY-1 Phone: [phone] Date 10/30/2021 Time 5:35 PM

## 2021-10-29 NOTE — Assessment & Plan Note (Addendum)
Vitals:   10/29/21 1334 10/29/21 1501  BP: (!) 186/110 (!) 232/114  Pulse: 84 77  Temp: 98.1 F (36.7 C)   SpO2: 100%    BP remains elevated at this visit. BMP last week showed AKI, Creat 3.01. Creatinine was 1.11 in 10/2020 but that was before he lost insurance and stopped his medications. Restarted meds this June. Has held his irbesartan-HCTZ since then. Not taking his amlodipine because of side effects. Only taking carvedilol 12.5 mg BID. Endorses chronic nausea, fatigue and back pain. Denies headache, chest pain, SOB, blurry vision, numbness.   Possibly some underlying kidney disease given his history of HTN an DM2. Plan to repeat BMP and adjust his HTN meds. Possibly need further workup but issue right now is insurance. Patient is waiting for orange card approval, he states he will call them today for update.   Plan -repeat BMP today -start Hydralazine 25 mg TID -hold irbesartan-HCTZ  -continue carvedilol -stop amlodipine per patient request -f/u in 1 week for recheck -further workup at future visit (nephrology referral, UA, urine microalbumin) when he receives his orange card  ADDENDUM 7/27: Called pt and made aware of results. Creatinine down to 2.9 but will need further workup. He is calling the orange card office tomorrow to check on application status. We will work with him and see about setting up nephrology referral and additional lab work (ua, renal u/s, spot protein to creatinine ratio).

## 2021-10-29 NOTE — Assessment & Plan Note (Signed)
Patient started Victoza 0.6 mg daily on 7/24. Tolerating it well. No hypoglycemic symptoms. He was unable to use the glucometer he bought at the store. Will send in meter and supplies to outpatient pharmacy.   Plan -titrate up to 1.2 mg daily  -sent in glucometer and supplies to pharmacy

## 2021-10-29 NOTE — Assessment & Plan Note (Signed)
Please see "Essential Hypertension" under problem-based list.

## 2021-10-30 LAB — BMP8+ANION GAP
Anion Gap: 15 mmol/L (ref 10.0–18.0)
BUN/Creatinine Ratio: 13 (ref 9–20)
BUN: 39 mg/dL — ABNORMAL HIGH (ref 6–24)
CO2: 21 mmol/L (ref 20–29)
Calcium: 8.6 mg/dL — ABNORMAL LOW (ref 8.7–10.2)
Chloride: 104 mmol/L (ref 96–106)
Creatinine, Ser: 2.9 mg/dL — ABNORMAL HIGH (ref 0.76–1.27)
Glucose: 113 mg/dL — ABNORMAL HIGH (ref 70–99)
Potassium: 4.5 mmol/L (ref 3.5–5.2)
Sodium: 140 mmol/L (ref 134–144)
eGFR: 25 mL/min/{1.73_m2} — ABNORMAL LOW (ref >59)

## 2021-10-30 NOTE — Progress Notes (Signed)
Internal Medicine Clinic Attending  I saw and evaluated the patient.  I personally confirmed the key portions of the history and exam documented by Dr. Markus Jarvis and I reviewed pertinent patient test results.  The assessment, diagnosis, and plan were formulated together and I agree with the documentation in the resident's note.  Patient's repeat BMP showed persistently elevated creatinine of 2.9. Will need referral to nephrology and further work up including u/a, spot protein to creatinine ratio and renal sono. Patient with no insurance currently. Will discuss with front desk about referral and follow up on orange card status.

## 2021-10-30 NOTE — Addendum Note (Signed)
Addended by: Angelique Blonder on: 10/30/2021 05:40 PM   Modules accepted: Orders

## 2021-11-07 ENCOUNTER — Other Ambulatory Visit: Payer: Self-pay

## 2021-11-07 ENCOUNTER — Ambulatory Visit (INDEPENDENT_AMBULATORY_CARE_PROVIDER_SITE_OTHER): Payer: Self-pay | Admitting: Student

## 2021-11-07 ENCOUNTER — Encounter: Payer: Self-pay | Admitting: Student

## 2021-11-07 VITALS — BP 127/79 | HR 92 | Temp 98.0°F | Ht 70.0 in | Wt 206.3 lb

## 2021-11-07 DIAGNOSIS — E119 Type 2 diabetes mellitus without complications: Secondary | ICD-10-CM

## 2021-11-07 DIAGNOSIS — Z87891 Personal history of nicotine dependence: Secondary | ICD-10-CM

## 2021-11-07 DIAGNOSIS — Z7984 Long term (current) use of oral hypoglycemic drugs: Secondary | ICD-10-CM

## 2021-11-07 DIAGNOSIS — R1084 Generalized abdominal pain: Secondary | ICD-10-CM

## 2021-11-07 DIAGNOSIS — E785 Hyperlipidemia, unspecified: Secondary | ICD-10-CM

## 2021-11-07 DIAGNOSIS — I10 Essential (primary) hypertension: Secondary | ICD-10-CM

## 2021-11-07 DIAGNOSIS — N179 Acute kidney failure, unspecified: Secondary | ICD-10-CM

## 2021-11-07 LAB — POCT URINALYSIS DIPSTICK
Bilirubin, UA: NEGATIVE
Glucose, UA: POSITIVE — AB
Ketones, UA: NEGATIVE
Leukocytes, UA: NEGATIVE
Nitrite, UA: NEGATIVE
Protein, UA: POSITIVE — AB
Spec Grav, UA: 1.02 (ref 1.010–1.025)
Urobilinogen, UA: 0.2 E.U./dL
pH, UA: 6.5 (ref 5.0–8.0)

## 2021-11-07 LAB — GLUCOSE, CAPILLARY: Glucose-Capillary: 196 mg/dL — ABNORMAL HIGH (ref 70–99)

## 2021-11-07 NOTE — Assessment & Plan Note (Signed)
Patient's last lipid panel was collected in 2021. His total cholesterol levels were 290.  - Order a lipid panel

## 2021-11-07 NOTE — Progress Notes (Signed)
CC: Hypertension  HPI:   [name] a 56 y.o. male with a past medical history of hypertension, diabetes, AKI, and hyperlipidemia who presents for follow-up for blood pressure control.  He was last seen in [facility] on 7-26 by Dr. Markus Jarvis.      Past Medical History:  Diagnosis Date   Anxiety    Panic Atacks   Arthritis    Carpal tunnel syndrome    Constipation    Depression    Diabetes mellitus    DMII   Dysrhythmia    hx tachy hr   GERD (gastroesophageal reflux disease)    no problem in past few years.   Hepatitis    Hyperlipidemia    Hypertension    Neuropathy associated with endocrine disorder ([place])    Seizures ([place])    "stress related" no more since taking Klonopin 2 years- See Neurologist  [facility]   Shortness of breath dyspnea    with exertion   Sleep apnea    used a cpap when he was 350lb-does not need one now 260lb   Wears contact lenses      Review of Systems:    Reports dry heaving, vomiting, nausea, diaphoresis, and abdominal pain Denies recent illnesses, fever, chills, headache, dizziness, lightheadedness    Physical Exam:  Vitals:   11/07/21 1002  BP: 127/79  Pulse: 92  Temp: 98 F (36.7 C)  TempSrc: Oral  SpO2: 100%  Weight: 206 lb 4.8 oz (93.6 kg)  Height: 5\' 10"  (1.778 m)    General:   awake and alert, sitting in chair, cooperative, not in acute distress but became slightly diaphoretic and uncomfortable during visit Lungs:   normal respiratory effort, breathing unlabored, symmetrical chest rise, no crackles or wheezing Cardiac:   regular rate and rhythm, normal S1 and S2 Abdomen:   soft and non-distended, hyperactive bowel sounds present in all four quadrants, no guarding or tenderness to palpation Psychiatric:   mood and affect normal, intelligible speech    Assessment & Plan:   Essential hypertension Has visited [facility] about once per week since 6-21 for better BP control. At this last visit on 7-26, he was started on  hydralazine and he continues to take carvedilol. He also started holding Avalide due to elevated Cr. His home BP readings over the past week have consistently been in the 130s/70s. Today in clinic, his BP was 127/79. Given his good BP control, there is no need to resume Avalide even if Cr is WNL.  - Continue carvedilol and hydralazine while holding Avalide - Consider further nephrology workup if Cr remains elevated and Orange Card has been approved   Diabetes [facility]) Patient started Victoza 0.6 mg daily on 7-24 and has since been trying to increase to 1.2mg . He has had a difficult time tolerating the higher dose due to his episodes of abdominal pain. He is also experiencing difficulty using insulin as he does not adhere to a regular meal or snacking schedule given his abdominal symptoms. Glucose readings at home are usually in the 120s range.  - Encourage sustaining Victoza 1.2mg  per day as tolerated and frequent monitoring of blood glucose at home   AKI (acute kidney injury) [facility]) Patient was found to have elevated creatinine at his visit to clinic on July 26.  Etiology is unclear though may be related to nonadherence to blood pressure medications for most of the last year due to insurance issues.  - Order BMP today to monitor Cr   Abdominal  pain Since 2020, patient has been experiencing frequent episodes of dry heaving, vomiting, nausea, diaphoresis, and abdominal pain.  These episodes occur on a daily basis.  They are triggered by food consumption and positional changes such as standing up or moving around. He also reports fatigue, diarrhea, constipation, and only 2 bowel movements per week.  He denies any stool or urine discoloration.  Gastric emptying colonoscopy and CT scans have been performed in the past all of which were normal.  Family history is significant for pancreatic stomach and colon cancers.  Given his other neurologic diabetic comorbidities, gastroparesis is likely a contributing  factor.  However his gastric emptying test performed December 2021 was normal.  Etiology is unclear at this time.  We will continue to monitor and work-up these symptoms.  - Order CBC, TSH, and urine microalbumin   Hyperlipidemia Patient's last lipid panel was collected in 2021. His total cholesterol levels were 290.  - Order a lipid panel     See Encounters Tab for problem based charting.  Patient seen with Dr. Evette Doffing

## 2021-11-07 NOTE — Assessment & Plan Note (Signed)
Patient was found to have elevated creatinine at his visit to clinic on July 26.  Etiology is unclear though may be related to nonadherence to blood pressure medications for most of the last year due to insurance issues.  - Order BMP today to monitor Cr

## 2021-11-07 NOTE — Assessment & Plan Note (Signed)
Patient started Victoza 0.6 mg daily on 7-24 and has since been trying to increase to 1.2mg . He has had a difficult time tolerating the higher dose due to his episodes of abdominal pain. He is also experiencing difficulty using insulin as he does not adhere to a regular meal or snacking schedule given his abdominal symptoms. Glucose readings at home are usually in the 120s range.  - Encourage sustaining Victoza 1.2mg  per day as tolerated and frequent monitoring of blood glucose at home

## 2021-11-07 NOTE — Assessment & Plan Note (Signed)
Since 2020, patient has been experiencing frequent episodes of dry heaving, vomiting, nausea, diaphoresis, and abdominal pain.  These episodes occur on a daily basis.  They are triggered by food consumption and positional changes such as standing up or moving around. He also reports fatigue, diarrhea, constipation, and only 2 bowel movements per week.  He denies any stool or urine discoloration.  Gastric emptying colonoscopy and CT scans have been performed in the past all of which were normal.  Family history is significant for pancreatic stomach and colon cancers.  Given his other neurologic diabetic comorbidities, gastroparesis is likely a contributing factor.  However his gastric emptying test performed December 2021 was normal.  Etiology is unclear at this time.  We will continue to monitor and work-up these symptoms.  - Order CBC, TSH, and urine microalbumin

## 2021-11-07 NOTE — Assessment & Plan Note (Signed)
Has visited [facility] about once per week since 6-21 for better BP control. At this last visit on 7-26, he was started on hydralazine and he continues to take carvedilol. He also started holding Avalide due to elevated Cr. His home BP readings over the past week have consistently been in the 130s/70s. Today in clinic, his BP was 127/79. Given his good BP control, there is no need to resume Avalide even if Cr is WNL.  - Continue carvedilol and hydralazine while holding Avalide - Consider further nephrology workup if Cr remains elevated and Orange Card has been approved

## 2021-11-07 NOTE — Patient Instructions (Signed)
  Thank you, [name] for allowing [place] to provide your care today. Today we discussed . . .  - high blood pressure and the importance of taking your medications daily - episodes of stomach pain and how good diabetes control may help - kidney function and we have ordered some tests to evaluate this   I have ordered the following labs for you:  Lab Orders         BMP8+Anion Gap         TSH         CBC no Diff         Microalbumin / Creatinine Urine Ratio         Glucose, capillary         Urinalysis, Reflex Microscopic       Tests ordered today:  None   Referrals ordered today:   Referral Orders  No referral(s) requested today      I have ordered the following medication/changed the following medications:   Stop the following medications: There are no discontinued medications.   Start the following medications: No orders of the defined types were placed in this encounter.     Follow up: 2-3 months    Remember: Please continue to take your blood pressure, diabetes, and cholesterol medications. We were happy to see a normal blood pressure today in clinic, keep up the great work!   Should you have any questions or concerns please call the internal medicine clinic at [phone].     Roswell Nickel, MD [place]

## 2021-11-08 ENCOUNTER — Other Ambulatory Visit (HOSPITAL_COMMUNITY): Payer: Self-pay

## 2021-11-08 LAB — BMP8+ANION GAP
Anion Gap: 16 mmol/L (ref 10.0–18.0)
BUN/Creatinine Ratio: 11 (ref 9–20)
BUN: 39 mg/dL — ABNORMAL HIGH (ref 6–24)
CO2: 21 mmol/L (ref 20–29)
Calcium: 8.6 mg/dL — ABNORMAL LOW (ref 8.7–10.2)
Chloride: 101 mmol/L (ref 96–106)
Creatinine, Ser: 3.44 mg/dL — ABNORMAL HIGH (ref 0.76–1.27)
Glucose: 184 mg/dL — ABNORMAL HIGH (ref 70–99)
Potassium: 4.2 mmol/L (ref 3.5–5.2)
Sodium: 138 mmol/L (ref 134–144)
eGFR: 20 mL/min/{1.73_m2} — ABNORMAL LOW (ref >59)

## 2021-11-08 LAB — URINALYSIS, ROUTINE W REFLEX MICROSCOPIC
Bilirubin, UA: NEGATIVE
Ketones, UA: NEGATIVE
Leukocytes,UA: NEGATIVE
Nitrite, UA: NEGATIVE
Specific Gravity, UA: 1.016 (ref 1.005–1.030)
Urobilinogen, Ur: 0.2 mg/dL (ref 0.2–1.0)
pH, UA: 6.5 (ref 5.0–7.5)

## 2021-11-08 LAB — CBC
Hematocrit: 37.3 % — ABNORMAL LOW (ref 37.5–51.0)
Hemoglobin: 12.3 g/dL — ABNORMAL LOW (ref 13.0–17.7)
MCH: 28.1 pg (ref 26.6–33.0)
MCHC: 33 g/dL (ref 31.5–35.7)
MCV: 85 fL (ref 79–97)
Platelets: 163 10*3/uL (ref 150–450)
RBC: 4.38 x10E6/uL (ref 4.14–5.80)
RDW: 13.6 % (ref 11.6–15.4)
WBC: 7.4 10*3/uL (ref 3.4–10.8)

## 2021-11-08 LAB — MICROSCOPIC EXAMINATION
Bacteria, UA: NONE SEEN
Casts: NONE SEEN /lpf

## 2021-11-08 LAB — MICROALBUMIN / CREATININE URINE RATIO
Creatinine, Urine: 87.2 mg/dL
Microalb/Creat Ratio: 5323 mg/g creat — ABNORMAL HIGH (ref 0–29)
Microalbumin, Urine: 4641.4 ug/mL

## 2021-11-08 LAB — TSH: TSH: 1.12 u[IU]/mL (ref 0.450–4.500)

## 2021-11-10 NOTE — Progress Notes (Signed)
Internal Medicine Clinic Attending  I saw and evaluated the patient.  I personally confirmed the key portions of the history and exam documented by Dr. Jodi Mourning and I reviewed pertinent patient test results.  The assessment, diagnosis, and plan were formulated together and I agree with the documentation in the resident's note.   Worsening renal function which I think likely represents CKD stage 4 in the setting of uncontrolled hypertension and diabetes for many years. He is having some vague symptoms that may be related to worsening uremia. At this point we should try to get the CKD manged by nephrology, may be challenging without insurance at this point. Probably should also stop Victoza and transition to an insulin regimen given worsening GFR and nausea concerns.

## 2021-11-10 NOTE — Progress Notes (Signed)
Spoke with patient over the phone and discussed his lab results including elevated Microablumin/Cr, Creatinine, and GFR. Informed him that these findings are suggestive of CKD. Informed him that we will schedule an appointment for later this week. Discussed the possibility of starting insulin regimen, stopping liraglutide, and establishing care with a nephrologist.

## 2021-11-12 ENCOUNTER — Encounter: Payer: Medicaid Other | Admitting: Infectious Diseases

## 2021-11-13 ENCOUNTER — Telehealth: Payer: Self-pay | Admitting: *Deleted

## 2021-11-13 NOTE — Telephone Encounter (Signed)
Patient called in stating he was instructed to stop taking Victoza until he meets with Dr. Johnnye Sima. He missed yesterday's appt and r/s for 8/15. He wants to ensure he still needs to hold Victoza till his appt on 8/15. States this is the only med he takes for his diabetes. Please advise.

## 2021-11-13 NOTE — Telephone Encounter (Signed)
Patient notified ok to stop Victoza till appt on 8/15. Unfortunately, patient only has [employer] so is basically uninsured at this time which prohibits Butch Penny from seeing him. He is applying for the [place].

## 2021-11-18 ENCOUNTER — Encounter: Payer: Self-pay | Admitting: Infectious Diseases

## 2021-11-18 ENCOUNTER — Other Ambulatory Visit: Payer: Self-pay

## 2021-11-18 ENCOUNTER — Ambulatory Visit (INDEPENDENT_AMBULATORY_CARE_PROVIDER_SITE_OTHER): Payer: Self-pay | Admitting: Infectious Diseases

## 2021-11-18 VITALS — BP 170/87 | HR 76 | Temp 98.4°F | Ht 70.0 in | Wt 216.6 lb

## 2021-11-18 DIAGNOSIS — B182 Chronic viral hepatitis C: Secondary | ICD-10-CM

## 2021-11-18 DIAGNOSIS — I10 Essential (primary) hypertension: Secondary | ICD-10-CM

## 2021-11-18 DIAGNOSIS — Z87891 Personal history of nicotine dependence: Secondary | ICD-10-CM

## 2021-11-18 DIAGNOSIS — N179 Acute kidney failure, unspecified: Secondary | ICD-10-CM

## 2021-11-18 NOTE — Assessment & Plan Note (Signed)
Denies CP or headaches.  Repeat is unchanged.  Appreciate IMTS f/u.

## 2021-11-18 NOTE — Assessment & Plan Note (Signed)
He has been treated and repeat tests (-). Will recheck today but assuming this is resolved (and his renal failure is not related to this).

## 2021-11-18 NOTE — Addendum Note (Signed)
Addended by: Truddie Crumble on: 11/18/2021 04:43 PM   Modules accepted: Orders

## 2021-11-18 NOTE — Progress Notes (Signed)
   Subjective:    Patient ID: [name], male  DOB: 06-28-1965, 57 y.o.        MRN: [id number]   HPI 56 yo M with hx of hyperlipidemia, HTN, CKD, DM2 (18 yrs).  Last A1C was 7.5% per pt, this year.  Has appt for Hep C in clinic today but states this has been treated. Review of his notes show that he was treated with Epclusa in 2021 for 12 weeks and cured.  He previously had 1a. His fibrosis score was unable to be calculated/non-cirrhotic.   Last year his Cr was 1.1 and has advanced to 3.1.  He is not sure if he has had a u/s.  No prev renal eval.  No problems with passing urine, always feels like he has to go, "I have an enlarged prostate".  NO hematuria.  Does not take NSAIDs due to GI distress.  Was off his anti-HTN rx for 6-8 months. His orange card was expired and he is working on getting his orange card active again (for last 6 weeks).     No results found for: "HIV1RNAQUANT", "HIV1RNAVL", "CD4TABS"   Health Maintenance  Topic Date Due  . HIV Screening  Never done  . Zoster Vaccines- Shingrix (1 of 2) Never done  . COVID-19 Vaccine (3 - Pfizer series) 09/06/2019  . FOOT EXAM  08/13/2020  . INFLUENZA VACCINE  11/04/2021  . OPHTHALMOLOGY EXAM  02/04/2022  . HEMOGLOBIN A1C  03/26/2022  . COLONOSCOPY (Pts 45-46yrs Insurance coverage will need to be confirmed)  07/16/2029  . TETANUS/TDAP  12/05/2029  . Hepatitis C Screening  Completed  . HPV VACCINES  Aged Out      Review of Systems  Constitutional:  Positive for weight loss. Negative for chills and fever.  Respiratory:  Positive for cough and sputum production. Negative for shortness of breath.   Cardiovascular:  Positive for leg swelling.  Gastrointestinal:  Negative for constipation and diarrhea.  Genitourinary:  Negative for dysuria, hematuria and urgency.  Neurological:  Positive for tingling.   Please see HPI. All other systems reviewed and negative.     Objective:  Physical Exam Vitals reviewed.   Constitutional:      General: He is not in acute distress.    Appearance: Normal appearance. He is not ill-appearing.  HENT:     Mouth/Throat:     Mouth: Mucous membranes are moist.     Pharynx: Oropharynx is clear. No oropharyngeal exudate.  Cardiovascular:     Rate and Rhythm: Normal rate and regular rhythm.  Pulmonary:     Effort: Pulmonary effort is normal.     Breath sounds: Normal breath sounds.  Abdominal:     General: Bowel sounds are normal. There is no distension.     Palpations: Abdomen is soft.     Tenderness: There is no abdominal tenderness.  Musculoskeletal:     Cervical back: Normal range of motion and neck supple.     Right lower leg: Edema present.  Neurological:     Mental Status: He is alert.  Psychiatric:        Mood and Affect: Mood normal.          Assessment & Plan:

## 2021-11-18 NOTE — Assessment & Plan Note (Signed)
Certainly he has risk factors for ID related AKI, and will screen for those, overall though he needs referral to renal and renal u/s asap.  He will f/u in IMTS.

## 2021-11-19 LAB — HIV ANTIBODY (ROUTINE TESTING W REFLEX): HIV Screen 4th Generation wRfx: NONREACTIVE

## 2021-11-20 LAB — HCV RNA QUANT: Hepatitis C Quantitation: NOT DETECTED IU/mL

## 2021-11-27 ENCOUNTER — Telehealth: Payer: Self-pay

## 2021-11-27 NOTE — Telephone Encounter (Signed)
Return pt's call. Stated he has gained 10 lbs within a week. His legs are swollen, up to his chest. States he feels tired all the time; sob just walking around the house. Feels bloated. I asked about pain - he stated he hurts all the time. No available appts until next week - instructed pt to go to the ER to be evaluated. Stated he does not want to go but he will.

## 2021-11-27 NOTE — Telephone Encounter (Signed)
Pt states his whole body is swollen, requesting to speak with a nurse.

## 2021-12-17 ENCOUNTER — Other Ambulatory Visit: Payer: Self-pay

## 2021-12-17 ENCOUNTER — Ambulatory Visit (INDEPENDENT_AMBULATORY_CARE_PROVIDER_SITE_OTHER): Payer: Commercial Managed Care - HMO

## 2021-12-17 ENCOUNTER — Encounter (HOSPITAL_COMMUNITY): Payer: Self-pay | Admitting: *Deleted

## 2021-12-17 ENCOUNTER — Emergency Department (HOSPITAL_COMMUNITY): Payer: Commercial Managed Care - HMO

## 2021-12-17 ENCOUNTER — Inpatient Hospital Stay (HOSPITAL_COMMUNITY)
Admission: EM | Admit: 2021-12-17 | Discharge: 2021-12-20 | DRG: 280 | Disposition: A | Discharge status: Home / Self Care | Payer: Commercial Managed Care - HMO | Attending: Student in an Organized Health Care Education/Training Program | Admitting: Student in an Organized Health Care Education/Training Program

## 2021-12-17 DIAGNOSIS — I5031 Acute diastolic (congestive) heart failure: Secondary | ICD-10-CM | POA: Diagnosis present

## 2021-12-17 DIAGNOSIS — Z79899 Other long term (current) drug therapy: Secondary | ICD-10-CM

## 2021-12-17 DIAGNOSIS — I10 Essential (primary) hypertension: Secondary | ICD-10-CM | POA: Diagnosis present

## 2021-12-17 DIAGNOSIS — R600 Localized edema: Secondary | ICD-10-CM | POA: Insufficient documentation

## 2021-12-17 DIAGNOSIS — N179 Acute kidney failure, unspecified: Secondary | ICD-10-CM | POA: Diagnosis present

## 2021-12-17 DIAGNOSIS — I161 Hypertensive emergency: Principal | ICD-10-CM | POA: Diagnosis present

## 2021-12-17 DIAGNOSIS — E785 Hyperlipidemia, unspecified: Secondary | ICD-10-CM | POA: Diagnosis present

## 2021-12-17 DIAGNOSIS — I21A1 Myocardial infarction type 2: Secondary | ICD-10-CM | POA: Diagnosis present

## 2021-12-17 DIAGNOSIS — E1122 Type 2 diabetes mellitus with diabetic chronic kidney disease: Secondary | ICD-10-CM | POA: Diagnosis present

## 2021-12-17 DIAGNOSIS — E1140 Type 2 diabetes mellitus with diabetic neuropathy, unspecified: Secondary | ICD-10-CM | POA: Diagnosis present

## 2021-12-17 DIAGNOSIS — Z833 Family history of diabetes mellitus: Secondary | ICD-10-CM

## 2021-12-17 DIAGNOSIS — R0602 Shortness of breath: Secondary | ICD-10-CM | POA: Diagnosis not present

## 2021-12-17 DIAGNOSIS — I130 Hypertensive heart and chronic kidney disease with heart failure and stage 1 through stage 4 chronic kidney disease, or unspecified chronic kidney disease: Secondary | ICD-10-CM | POA: Diagnosis present

## 2021-12-17 DIAGNOSIS — Z8249 Family history of ischemic heart disease and other diseases of the circulatory system: Secondary | ICD-10-CM

## 2021-12-17 DIAGNOSIS — K5900 Constipation, unspecified: Secondary | ICD-10-CM | POA: Diagnosis present

## 2021-12-17 DIAGNOSIS — M1990 Unspecified osteoarthritis, unspecified site: Secondary | ICD-10-CM | POA: Diagnosis present

## 2021-12-17 DIAGNOSIS — Z888 Allergy status to other drugs, medicaments and biological substances status: Secondary | ICD-10-CM

## 2021-12-17 DIAGNOSIS — Z87891 Personal history of nicotine dependence: Secondary | ICD-10-CM

## 2021-12-17 DIAGNOSIS — Z8051 Family history of malignant neoplasm of kidney: Secondary | ICD-10-CM

## 2021-12-17 DIAGNOSIS — E11610 Type 2 diabetes mellitus with diabetic neuropathic arthropathy: Secondary | ICD-10-CM | POA: Diagnosis present

## 2021-12-17 DIAGNOSIS — N184 Chronic kidney disease, stage 4 (severe): Secondary | ICD-10-CM | POA: Diagnosis present

## 2021-12-17 DIAGNOSIS — Z794 Long term (current) use of insulin: Secondary | ICD-10-CM

## 2021-12-17 DIAGNOSIS — Z800 Family history of malignant neoplasm of digestive organs: Secondary | ICD-10-CM

## 2021-12-17 DIAGNOSIS — E1165 Type 2 diabetes mellitus with hyperglycemia: Secondary | ICD-10-CM | POA: Diagnosis present

## 2021-12-17 DIAGNOSIS — E119 Type 2 diabetes mellitus without complications: Secondary | ICD-10-CM | POA: Insufficient documentation

## 2021-12-17 LAB — COMPREHENSIVE METABOLIC PANEL
ALT: 11 U/L (ref 0–44)
AST: 21 U/L (ref 15–41)
Albumin: 2.7 g/dL — ABNORMAL LOW (ref 3.5–5.0)
Alkaline Phosphatase: 61 U/L (ref 38–126)
Anion gap: 7 (ref 5–15)
BUN: 29 mg/dL — ABNORMAL HIGH (ref 6–20)
CO2: 22 mmol/L (ref 22–32)
Calcium: 8.7 mg/dL — ABNORMAL LOW (ref 8.9–10.3)
Chloride: 109 mmol/L (ref 98–111)
Creatinine, Ser: 3.24 mg/dL — ABNORMAL HIGH (ref 0.61–1.24)
GFR, Estimated: 22 mL/min — ABNORMAL LOW (ref >60)
Glucose, Bld: 115 mg/dL — ABNORMAL HIGH (ref 70–99)
Potassium: 4.2 mmol/L (ref 3.5–5.1)
Sodium: 138 mmol/L (ref 135–145)
Total Bilirubin: 0.7 mg/dL (ref 0.3–1.2)
Total Protein: 6.9 g/dL (ref 6.5–8.1)

## 2021-12-17 LAB — URINALYSIS, ROUTINE W REFLEX MICROSCOPIC
Bilirubin Urine: NEGATIVE
Glucose, UA: 50 mg/dL — AB
Hgb urine dipstick: NEGATIVE
Ketones, ur: NEGATIVE mg/dL
Leukocytes,Ua: NEGATIVE
Nitrite: NEGATIVE
Protein, ur: >=300 mg/dL — AB
Specific Gravity, Urine: 1.009 (ref 1.005–1.030)
pH: 6 (ref 5.0–8.0)

## 2021-12-17 LAB — CBC WITH DIFFERENTIAL/PLATELET
Abs Immature Granulocytes: 0.02 10*3/uL (ref 0.00–0.07)
Basophils Absolute: 0.1 10*3/uL (ref 0.0–0.1)
Basophils Relative: 1 %
Eosinophils Absolute: 0.2 10*3/uL (ref 0.0–0.5)
Eosinophils Relative: 2 %
HCT: 35.3 % — ABNORMAL LOW (ref 39.0–52.0)
Hemoglobin: 11.2 g/dL — ABNORMAL LOW (ref 13.0–17.0)
Immature Granulocytes: 0 %
Lymphocytes Relative: 30 %
Lymphs Abs: 2.2 10*3/uL (ref 0.7–4.0)
MCH: 28.3 pg (ref 26.0–34.0)
MCHC: 31.7 g/dL (ref 30.0–36.0)
MCV: 89.1 fL (ref 80.0–100.0)
Monocytes Absolute: 0.5 10*3/uL (ref 0.1–1.0)
Monocytes Relative: 7 %
Neutro Abs: 4.3 10*3/uL (ref 1.7–7.7)
Neutrophils Relative %: 60 %
Platelets: 176 10*3/uL (ref 150–400)
RBC: 3.96 MIL/uL — ABNORMAL LOW (ref 4.22–5.81)
RDW: 14.2 % (ref 11.5–15.5)
WBC: 7.3 10*3/uL (ref 4.0–10.5)
nRBC: 0 % (ref 0.0–0.2)

## 2021-12-17 LAB — CBG MONITORING, ED
Glucose-Capillary: 108 mg/dL — ABNORMAL HIGH (ref 70–99)
Glucose-Capillary: 124 mg/dL — ABNORMAL HIGH (ref 70–99)

## 2021-12-17 LAB — BRAIN NATRIURETIC PEPTIDE: B Natriuretic Peptide: 649.8 pg/mL — ABNORMAL HIGH (ref 0.0–100.0)

## 2021-12-17 LAB — TROPONIN I (HIGH SENSITIVITY)
Troponin I (High Sensitivity): 46 ng/L — ABNORMAL HIGH (ref <18)
Troponin I (High Sensitivity): 49 ng/L — ABNORMAL HIGH (ref <18)

## 2021-12-17 LAB — LIPASE, BLOOD: Lipase: 32 U/L (ref 11–51)

## 2021-12-17 MED ORDER — INSULIN ASPART 100 UNIT/ML IJ SOLN
0.0000 [IU] | Freq: Three times a day (TID) | INTRAMUSCULAR | Status: DC
  Administered 2021-12-19: 2 [IU] via SUBCUTANEOUS
  Administered 2021-12-20: 3 [IU] via SUBCUTANEOUS

## 2021-12-17 MED ORDER — FUROSEMIDE 10 MG/ML IJ SOLN
40.0000 mg | Freq: Once | INTRAMUSCULAR | Status: AC
  Administered 2021-12-17: 40 mg via INTRAVENOUS
  Filled 2021-12-17: qty 4

## 2021-12-17 MED ORDER — ONDANSETRON 4 MG PO TBDP: 4.0000 mg | ORAL_TABLET | Freq: Three times a day (TID) | ORAL | Status: DC | PRN

## 2021-12-17 MED ORDER — NICARDIPINE HCL IN NACL 20-0.86 MG/200ML-% IV SOLN
3.0000 mg/h | INTRAVENOUS | Status: DC
  Administered 2021-12-17: 8 mg/h via INTRAVENOUS
  Administered 2021-12-17: 3 mg/h via INTRAVENOUS
  Administered 2021-12-18 (×2): 5 mg/h via INTRAVENOUS
  Filled 2021-12-17 (×4): qty 200

## 2021-12-17 MED ORDER — NITROGLYCERIN 0.4 MG SL SUBL
0.4000 mg | SUBLINGUAL_TABLET | SUBLINGUAL | Status: DC | PRN
  Administered 2021-12-17: 0.4 mg via SUBLINGUAL
  Filled 2021-12-17: qty 1

## 2021-12-17 MED ORDER — ENOXAPARIN SODIUM 40 MG/0.4ML IJ SOSY
40.0000 mg | PREFILLED_SYRINGE | INTRAMUSCULAR | Status: DC
  Administered 2021-12-17 – 2021-12-19 (×3): 40 mg via SUBCUTANEOUS
  Filled 2021-12-17 (×3): qty 0.4

## 2021-12-17 MED ORDER — ROSUVASTATIN CALCIUM 20 MG PO TABS
20.0000 mg | ORAL_TABLET | Freq: Every day | ORAL | Status: DC
  Administered 2021-12-18 – 2021-12-20 (×3): 20 mg via ORAL
  Filled 2021-12-17 (×3): qty 1

## 2021-12-17 MED ORDER — INSULIN ASPART 100 UNIT/ML IJ SOLN: 0.0000 [IU] | Freq: Every day | INTRAMUSCULAR | Status: DC

## 2021-12-17 NOTE — Hospital Course (Addendum)
[name] is a 56 y.o. with a pertinent PMH of hypertension, uncontrolled type 2 diabetes, neuropathy, Charcot foot deformity, constipation presenting with concerns of fluid retention and shortness of breath admitted for severe symptomatic hypertension.  #Heart failure with preserved ejection fraction Echo on 12/18/2021 showing ejection fraction of 60 to 65%.  Volume overload likely secondary to severe symptomatic hypertension and HFpEF. Patient diuresed during hospitalization and recovered well.  Patient to be discharged on Lasix 40 mg daily.  #Primary Hypertension #Elevated troponins, resolved Blood pressures initially were elevated in the 240s, decreasing kidney function and elevated tropes and severe symptomatic hypertension diagnosis was made.  Patient initially was started on Cardene drip and weaned off to carvedilol and hydralazine which are his home medications.  Symptoms resolved.  Blood pressure decreased to the 248G systolic.  Patient to be discharged on Coreg 25 mg twice daily and hydralazine 50 mg 3 times daily.  #AKI on suspected CKD, unclear baseline stage Patient did have elevated creatinine during hospitalization.  Unclear if this is his new baseline versus an AKI.  It was monitored during hospitalization, and the creatinine stayed near 3.1-3.3.   #Chronic nausea and vomiting Patient has chronic nausea and vomiting.  No vomiting during hospitalization.  He has been worked up outpatient with gastric emptying study, colonoscopy, EGD, and CT scan.  Patient was provided antiemetics on a as needed basis during hospitalization.   #Non-insulin dependent type 2 diabetes mellitus #Diabetic neuropathy #Diabetic nephropathy Patient's sugars have been measuring in between 97-171 during hospitalization.  He did not require much insulin during his hospitalization.  It was noted that he has significant albuminuria.  Patient to be discharged on no medications.

## 2021-12-17 NOTE — ED Provider Triage Note (Signed)
Emergency Medicine Provider Triage Evaluation Note  [name] , a 56 y.o. male  was evaluated in triage.  Pt complains of high blood pressure over the last 3 to 4 months.  He has not been taking his BP medication reports fluid/swelling in his legs, however denies chest pain, shortness of breath, confusion, or fevers.  Hx of hep C, DMT2, essential HTN, Hx of chronic alcohol abuse, though no longer drinks alcohol.  Endorses chronic stomach pains, dry heaves daily, and poor ability to keep down food and fluids on a chronic basis.  Review of Systems  Positive:  Negative: See above  Physical Exam  BP (!) 244/126   Pulse 88   Temp 98.6 F (37 C)   Resp 18   Ht 5\' 10"  (1.778 m)   Wt 103.4 kg   SpO2 100%   BMI 32.71 kg/m  Gen:   Awake, no distress   Resp:  Normal effort, CTAB MSK:   Moves extremities without difficulty  Other:  Lower extremity edema, right worse than left.  Medical Decision Making  Medically screening exam initiated at 4:37 PM.  Appropriate orders placed.  Nainoa W[place]t Ales was informed that the remainder of the evaluation will be completed by another provider, this initial triage assessment does not replace that evaluation, and the importance of remaining in the ED until their evaluation is complete.  Discussed with triage nurse, plan to bring back to next available room   Prince Rome, PA-C 58/83/25 1642

## 2021-12-17 NOTE — H&P (Addendum)
Date: 12/17/2021               Patient Name:  [name] MRN: [id number]  DOB: 03/29/1966 Age / Sex: 56 y.o., male   PCP: Pcp, No         Medical Service: Internal Medicine Teaching Service         Attending Physician: Dr. Charise Killian, MD    First Contact: Leigh Aurora, DO Pager: [phone]  Second Contact: Linwood Dibbles, MD  Pager: PA [phone]        After Hours (After 5p/  First Contact Pager: [phone]  weekends / holidays): Second Contact Pager: [phone]   SUBJECTIVE   Chief Complaint: leg swelling, orthopnea  History of Present Illness:   [name] is a 56 y.o. person living with a history of HTN, uncontrolled T2DM c/b neuropathy and charcot foot deformity, constipation who presented with BLE edema, orthopnea, and elevated BP.  He reports that he came in to the ED today because of increasing fluid in his legs, torso, and fingers. Reports that he began having swelling in his legs about 2 months ago, which has progressively worsened. It is worse when he is sitting, and mildly improves with ambulation. It has gotten to the point where he has to wear larger fitting clothes and has noted that his belt feels more tight especially when sitting. It progressively worsened to include his torso and his fingers (unable to wear his rings as he used to). He reports weight gain of about 20 lbs over the past 2 months. States he does not each enough to have gained this much weight and suspects that it is mainly from swelling. He does endorse orthopnea and SHOB with exertion that began this past Monday. States he is no longer able to sleep flat without getting SHOB. He is also unable to walk from his bedroom to his kitchen (about 20-25 steps) without feeling SHOB.   Of note, patient is established at the [facility] He was formerly a patient there but was lost to follow up for about 8 months after losing his insurance. During these 8 months, he was off of any antihypertensive medication. About 2  months ago, he re-established care at [facility] and they have been working towards getting better BP control without success. He was initially started on losartan but it was later discontinued due to his kidney function. Ultimately, he was placed on hydralazine TID and carvedilol BID dosing. He reports taking his hydralazine usually once or twice a day (unable to do TID dosing because of work?) and reports taking his coreg twice a day as prescribed. While on these medications, he reports his BP typically stays around 170s/90s.   Patient also reports ongoing nausea and vomiting since 2020. States that it is mostly dry heaving which he experiences every morning for about 15-30 minutes. Rarely does he truly have to vomit and when he does, it is usually liquid in consistency (NBNB). He thinks it may be GERD but unsure. Due to this, he has been unable to eat regularly. He has not eaten yesterday or today and has been unable to take his home medications due to this. He states that the only thing that helps with his symptoms is smoking marijuana. He developed these symptoms in 2021 and began smoking marijuana in 2021. He does endorse some improvement in symptoms thereafter only in regards to getting a better appetite and tolerating some PO intake. However, his symptoms do recur the following day. He reports  ongoing constipation during this time as well, typically having 1-2 bowel movements every week. He is not taking any laxatives at home for constipation.   Patient has history of T2DM. Prior to being lost follow up, his A1c was 7.1%, but this increased to 7.6% in 09/2021. He reports taking lantus during the 8 months that he was without his other medications, although not taking it as prescribed. He was prescribed lantus 30u BID at that time, but would only take 20u on days that he had a good appetite (typically once or twice a week). Otherwise, he would not take it at all. After being reestablished in the clinic, he was  switched from lantus to [place]. However, he was later taken off of victoza as well due to his kidney function.   In the ED, patient was noted to be in hypertensive emergency with newly diagnosed acute heart failure. He was started on cardene drip and given one dose of IV lasix 40mg  with symptomatic improvement. IMTS asked to admit patient.   Meds:  Current Meds  Medication Sig   carvedilol (COREG) 12.5 MG tablet Take 1 tablet (12.5 mg total) by mouth 2 (two) times daily with a meal.   feeding supplement (BOOST HIGH PROTEIN) LIQD Take 1 Container by mouth 3 (three) times daily between meals.   hydrALAZINE (APRESOLINE) 25 MG tablet Take 1 tablet (25 mg total) by mouth 3 (three) times daily. (Patient taking differently: Take 25 mg by mouth 2 (two) times daily.)   rosuvastatin (CRESTOR) 20 MG tablet Take 1 tablet (20 mg total) by mouth daily.    Past Medical History  Past Surgical History:  Procedure Laterality Date   COLONOSCOPY     KNEE ARTHROSCOPY W/ MENISCAL REPAIR  2000   left   LUMBAR LAMINECTOMY  2002    Social:  Social History   Socioeconomic History   Marital status: Single    Spouse name: Not on file   Number of children: Not on file   Years of education: Not on file   Highest education level: Not on file  Occupational History   Not on file  Tobacco Use   Smoking status: Former    Types: Cigarettes    Quit date: 10/27/2008    Years since quitting: 13.1   Smokeless tobacco: Never   Tobacco comments:    Quit x 10 yrs.  Vaping Use   Vaping Use: Never used  Substance and Sexual Activity   Alcohol use: Not Currently    Comment: Beer sometimes.   Drug use: No    Types: Marijuana   Sexual activity: Not Currently    Partners: Male    Comment: last HIV test years ago.  Other Topics Concern   Not on file  Social History Narrative   Not on file   Social Determinants of Health   Financial Resource Strain: Not on file  Food Insecurity: Not on file   Transportation Needs: Not on file  Physical Activity: Not on file  Stress: Not on file  Social Connections: Not on file  Intimate Partner Violence: Not on file     Family History:  Family History  Problem Relation Age of Onset   Cancer Mother        pancres   Diabetes Mother    Hypertension Brother    Renal cancer Brother    Cancer Maternal Uncle    Colon cancer Maternal Uncle    Colon cancer Maternal Uncle    Stomach cancer Maternal  Uncle    Esophageal cancer Neg Hx    Rectal cancer Neg Hx    Allergies: Allergies as of 12/17/2021 - Review Complete 12/17/2021  Allergen Reaction Noted   Amlodipine Other (See Comments) 11/18/2021   Lisinopril Swelling and Other (See Comments) 12/17/2021    Review of Systems: A complete ROS was negative except as per HPI.   OBJECTIVE:   Physical Exam: Blood pressure (!) 197/94, pulse (!) 103, temperature 98.6 F (37 C), resp. rate 18, height 5\' 10"  (1.778 m), weight 103.4 kg, SpO2 98 %.   General: pleasant middle-aged male, sitting comfortably in bed, cooperative, NAD. HENT: normocephalic and atraumatic, moist oral mucosa. Eyes: PERRL, EOMI. Cardiac: tachycardic rate with regular rhythm, systolic murmur best appreciated at LUSB. 2-3+ pitting edema up to knees bilaterally. JVD up to ear. Pulm: mild bibasilar crackles noted. Normal respiratory effort, saturating well on RA. Abdomen: soft, nondistended, hypoactive bowel sounds, nontender to palpation.  MSK: normal bulk and tone. Charcot deformity noted on R foot. Large callus on distal great L toe. Skin: extremities warm and well perfused. Neuro: AAOx3, no acute focal deficits noted. Absent sensation in 1st and 2nd digits of bilateral LE.  Psych: normal mood and affect.   Labs: CBC    Component Value Date/Time   WBC 7.3 12/17/2021 1650   RBC 3.96 (L) 12/17/2021 1650   HGB 11.2 (L) 12/17/2021 1650   HGB 12.3 (L) 11/07/2021 1118   HCT 35.3 (L) 12/17/2021 1650   HCT 37.3 (L)  11/07/2021 1118   PLT 176 12/17/2021 1650   PLT 163 11/07/2021 1118   MCV 89.1 12/17/2021 1650   MCV 85 11/07/2021 1118   MCH 28.3 12/17/2021 1650   MCHC 31.7 12/17/2021 1650   RDW 14.2 12/17/2021 1650   RDW 13.6 11/07/2021 1118   LYMPHSABS 2.2 12/17/2021 1650   LYMPHSABS 2.6 05/10/2019 1021   MONOABS 0.5 12/17/2021 1650   EOSABS 0.2 12/17/2021 1650   EOSABS 0.3 05/10/2019 1021   BASOSABS 0.1 12/17/2021 1650   BASOSABS 0.1 05/10/2019 1021     CMP     Component Value Date/Time   NA 138 12/17/2021 1650   NA 138 11/07/2021 1118   K 4.2 12/17/2021 1650   CL 109 12/17/2021 1650   CO2 22 12/17/2021 1650   GLUCOSE 115 (H) 12/17/2021 1650   BUN 29 (H) 12/17/2021 1650   BUN 39 (H) 11/07/2021 1118   CREATININE 3.24 (H) 12/17/2021 1650   CREATININE 1.31 11/21/2019 1445   CALCIUM 8.7 (L) 12/17/2021 1650   PROT 6.9 12/17/2021 1650   PROT 7.1 05/10/2019 1021   ALBUMIN 2.7 (L) 12/17/2021 1650   ALBUMIN 3.3 (L) 05/10/2019 1021   AST 21 12/17/2021 1650   ALT 11 12/17/2021 1650   ALT 57 (H) 05/16/2019 0958   ALKPHOS 61 12/17/2021 1650   BILITOT 0.7 12/17/2021 1650   BILITOT 0.2 05/10/2019 1021   GFRNONAA 22 (L) 12/17/2021 1650   GFRNONAA >89 01/29/2016 0925   GFRAA 101 12/06/2019 1357   GFRAA >89 01/29/2016 0925    Imaging: DG Chest 2 View  Result Date: 12/17/2021 CLINICAL DATA:  Hypertension. EXAM: CHEST - 2 VIEW COMPARISON:  Chest radiograph dated 10/21/2010. FINDINGS: Shallow inspiration with minimal bibasilar atelectasis. Blunting of the costophrenic angles may represent scarring versus trace pleural effusion. No focal consolidation, or pneumothorax. The cardiac silhouette is within limits. No acute osseous pathology. IMPRESSION: Shallow inspiration with minimal bibasilar atelectasis. Electronically Signed   By: Laren Everts.D.  On: 12/17/2021 17:34     EKG: personally reviewed my interpretation is NSR with some LVH.  ASSESSMENT & PLAN:   Assessment & Plan by  Problem: Principal Problem:   Hypertensive emergency Active Problems:   Uncontrolled type 2 diabetes mellitus with hyperglycemia (HCC)   Essential hypertension   Diabetic neuropathy (HCC)   Charcot foot due to diabetes mellitus ([place])   Lower extremity edema   [name] is a 56 y.o. person living with a history of HTN, uncontrolled T2DM c/b neuropathy and charcot foot deformity, constipation who presented with BLE edema, orthopnea, and elevated BP and admitted for hypertensive emergency on hospital day 0.   #Hypertensive emergency #Elevated troponin #Proteinuria #Essential hypertension Patient presented with severely elevated BP readings (240s/120s) in the setting of chronically uncontrolled HTN. Patient also with mildly elevated troponins, elevated BNP, and proteinuria as markers for end-organ dysfunction. Started on cardene drip in the ED with goal SBP 180-190 in the first hour and then goal SBP 150-160 over the following 23 hours. He is comfortable-appearing and HDS at this time. He is hungry, so will start diet and see if he tolerates it. If so, he will likely be able to transition to oral antihypertensives tomorrow. Troponin elevation is likely demand ischemia in the setting of hypertensive emergency and possible acute HF exacerbation. -continue cardene drip with goal SBP 150-160 now -telemetry -SL nitro prn for chest pain (denies any right now) -HH/CM diet -can likely transition to oral antihypertensives tomorrow -trending troponins until they peak  #LE swelling #Orthopnea, SHOB with exertion #20lb weight gain Patient with LE swelling for the past couple of months, 20lb weight gain, and more recent-onset orthopnea and SHOB with exertion. He does have an elevated BNP and JVD to ear on my exam. This constellation of symptoms is strongly suggestive of acute HF exacerbation (new diagnosis) in the setting of chronically uncontrolled HTN and his presenting hypertensive emergency.  Will need to further evaluate with ECHO and see what GDMT we can initiate for patient, although will need to be careful given poor kidney function with GFR 22. Of note, patient does have hypoalbuminemia in the setting of chronic malnourishment 2/2 poor appetite and chronic N/V which may be contributing to swelling, though I think HF is more likely. -f/u ECHO -f/u TSH, Mg -IV lasix 40mg  daily -GDMT as tolerated -strict I/Os, daily standing weights -trend BMP, goal K >4.0 and Mg >2.0  #AKI vs CKD IV Unclear if patient has AKI or CKD IV. BMP from 1 year ago with baseline Cr ~1.1. BMPs during clinic visits last month with Cr 3 - 3.4. On admission, Cr 3.24. It is certainly possible that patient developed CKD from uncontrolled T2DM and uncontrolled HTN during the course of this past year, especially with nonadherence to his home medications. However, this could certainly be an AKI as well given precipitous rise and no interval lab work to evaluate trend. Worried for possible cardiorenal syndrome given his volume overload seems to have started about 2 months ago and he appears to have new-onset heart failure. Does also have proteinuria on UA, which looks to be new. If cardiorenal, should improve with diuresis. If CKD, will need to consider nephrology evaluation for outpatient follow up.  -trial of diuresis -renal U/S to r/o obstruction -trend BMP -consider nephrology consultation for outpatient follow up -avoid nephrotoxic medications -monitor UOP  #Loss of appetite #Chronic, recurrent early-morning N/V #Chronic constipation All of these appear to be chronic, dating back to about 2020.  His appetite is limited by ongoing early-morning N/V and constipation. He has had extensive workup with CT abdomen, EGD, colonoscopy, and gastric emptying study in 2021, all of which were unrevealing. This history is suggestive of cyclic vomiting syndrome although unclear as to exact etiology. It is possible that some of  this may be exacerbated by daily cannabis use, but, per patient, he began using after symptoms began in order to better control symptoms and gain an appetite. -zofran prn for nausea -miralax daily and senna-S qhs prn for constipation -supportive care, avoid IVF given fluid overload -consider trial of intranasal sumatriptan to abort, if no improvement with zofran -consider trial of amitriptyline or topiramate for prophylaxis -counseling on marijuana cessation -consider outpatient GI evaluation if persistence of symptoms  #T2DM with hyperglycemia #Diabetic neuropathy #Charcot foot deformity Patient with history of T2DM, currently not on any medications (stopped his victoza). His most recent A1c was 7.6% in 09/2021, will repeat this here. He will need continued counseling on maintaining goal A1c of <7.0% to prevent further diabetic complications from arising. It is possible that he already has diabetic CKD given proteinuria and GFR 22, though unable to definitely diagnose at this time given absence of interval lab work.  -f/u A1c -moderate SSI -trend CBGs -will need to optimize outpatient diabetic regimen (currently not taking any medication for this) -consider microalbumin-to-creatinine ratio as outpatient   Diet: HH/CM VTE: Enoxaparin IVF: None,None Code: Full  Prior to Admission Living Arrangement: Home, living alone Anticipated Discharge Location: Home Barriers to Discharge: BP control, CHF workup  Dispo: Admit patient to Observation with expected length of stay less than 2 midnights.  Signed: Virl Axe, MD Internal Medicine Resident PGY-3 12/17/2021, 11:35 PM

## 2021-12-17 NOTE — ED Provider Notes (Signed)
[facility] EMERGENCY DEPARTMENT Provider Note  CSN: [id number] Arrival date & time: 12/17/21 1543  Chief Complaint(s) Hypertension  HPI [name] is a 56 y.o. male with PMH T2DM, HTN, HLD who presents emergency department for evaluation of bilateral lower extremity edema and orthopnea.  Patient states that over the last 1 month they have had significant difficulty controlling his blood pressures and he frequently has systolics greater than 244.  He states that he has had approximately 20 pound weight gain with worsening lower extremity edema and new orthopnea.  He saw his primary care physician today who transferred him to the emergency department for likely admission and diuresis.   Past Medical History Past Medical History:  Diagnosis Date   Anxiety    Panic Atacks   Arthritis    Carpal tunnel syndrome    Constipation    Depression    Diabetes mellitus    DMII   Dysrhythmia    hx tachy hr   GERD (gastroesophageal reflux disease)    no problem in past few years.   Hepatitis    Hyperlipidemia    Hypertension    Neuropathy associated with endocrine disorder ([place])    Seizures ([place])    "stress related" no more since taking Klonopin 2 years- See Neurologist  [facility]   Shortness of breath dyspnea    with exertion   Sleep apnea    used a cpap when he was 350lb-does not need one now 260lb   Wears contact lenses    Patient Active Problem List   Diagnosis Date Noted   Lower extremity edema 12/17/2021   Hypertensive emergency 12/17/2021   AKI (acute kidney injury) ([place]) 10/29/2021   Constipation 10/10/2021   Tachycardia 10/02/2021   Unintentional weight loss 09/28/2021   Charcot foot due to diabetes mellitus ([place]) 09/05/2020   Ingrown toenail 06/10/2020   Prostate cancer screening 06/10/2020   Abdominal pain 12/28/2019   Depression    Diabetic neuropathy ([place]) 09/19/2019   Underinsured 05/15/2019   Hyperlipidemia 05/10/2019   Chronic hepatitis  C ([place]) 05/10/2019   Abnormal CT scan of lung 05/10/2019   Diabetes ([place]) 01/29/2016   Essential hypertension 01/29/2016   Lumbar facet arthropathy 08/29/2014   Home Medication(s) Prior to Admission medications   Medication Sig Start Date End Date Taking? Authorizing Provider  carvedilol (COREG) 12.5 MG tablet Take 1 tablet (12.5 mg total) by mouth 2 (two) times daily with a meal. 10/10/21 10/10/22 Yes Angelique Blonder, DO  feeding supplement (BOOST HIGH PROTEIN) LIQD Take 1 Container by mouth 3 (three) times daily between meals.   Yes [provider]  hydrALAZINE (APRESOLINE) 25 MG tablet Take 1 tablet (25 mg total) by mouth 3 (three) times daily. Patient taking differently: Take 25 mg by mouth 2 (two) times daily. 10/29/21 10/29/22 Yes Angelique Blonder, DO  rosuvastatin (CRESTOR) 20 MG tablet Take 1 tablet (20 mg total) by mouth daily. 10/24/21 01/22/22 Yes Angelique Blonder, DO  blood glucose meter kit and supplies KIT Please check your blood sugar three times a day. 10/29/21   Angelique Blonder, DO  glucose blood test strip use 1 strip to check blood sugar 3 times daily 10/29/21   Angelique Blonder, DO  Insulin Pen Needle (PEN NEEDLES) 32G X 4 MM MISC Use as directed with Victoza daily 10/24/21   Angelique Blonder, DO  Insulin Syringe-Needle U-100 (INSULIN SYRINGE .3CC/31GX5/16") 31G X 5/16" 0.3 ML MISC The patient is insulin requiring, ICD 10 code E11.9. The uses insulin  2 times per day. 01/22/20   Lacinda Axon, MD  irbesartan-hydrochlorothiazide (AVALIDE) 150-12.5 MG tablet Take 2 tablets by mouth daily. Patient not taking: Reported on 12/17/2021 10/17/21   Angelique Blonder, DO  Lancets [facility] DELICA PLUS [id number]) MISC use 1 lancet to check blood sugar 3 times daily 10/29/21   Angelique Blonder, DO  liraglutide (VICTOZA) 18 MG/3ML SOPN Inject 0.6 mg into the skin daily for 7 days. Then inject 1.2 mg into the skin daily. Patient not taking: Reported on 12/17/2021 10/24/21 12/17/21  Angelique Blonder, DO                                                                                                                                     Past Surgical History Past Surgical History:  Procedure Laterality Date   COLONOSCOPY     KNEE ARTHROSCOPY W/ MENISCAL REPAIR  2000   left   LUMBAR LAMINECTOMY  2002   Family History Family History  Problem Relation Age of Onset   Cancer Mother        pancres   Diabetes Mother    Hypertension Brother    Renal cancer Brother    Cancer Maternal Uncle    Colon cancer Maternal Uncle    Colon cancer Maternal Uncle    Stomach cancer Maternal Uncle    Esophageal cancer Neg Hx    Rectal cancer Neg Hx     Social History Social History   Tobacco Use   Smoking status: Former    Types: Cigarettes    Quit date: 10/27/2008    Years since quitting: 13.1   Smokeless tobacco: Never   Tobacco comments:    Quit x 10 yrs.  Vaping Use   Vaping Use: Never used  Substance Use Topics   Alcohol use: Not Currently    Comment: Beer sometimes.   Drug use: No    Types: Marijuana   Allergies Amlodipine and Lisinopril  Review of Systems Review of Systems  Respiratory:  Positive for shortness of breath.   Cardiovascular:  Positive for leg swelling.    Physical Exam Vital Signs  I have reviewed the triage vital signs BP (!) 180/99   Pulse (!) 107   Temp 98.2 F (36.8 C) (Oral)   Resp 18   Ht '5\' 10"'  (1.778 m)   Wt 103.4 kg   SpO2 97%   BMI 32.71 kg/m   Physical Exam Constitutional:      General: He is not in acute distress.    Appearance: Normal appearance.  HENT:     Head: Normocephalic and atraumatic.     Nose: No congestion or rhinorrhea.  Eyes:     General:        Right eye: No discharge.        Left eye: No discharge.     Extraocular Movements: Extraocular movements intact.     Pupils: Pupils are equal, round, and  reactive to light.  Cardiovascular:     Rate and Rhythm: Normal rate and regular rhythm.     Heart sounds: No murmur  heard. Pulmonary:     Effort: No respiratory distress.     Breath sounds: Rales present. No wheezing.  Abdominal:     General: There is no distension.     Tenderness: There is no abdominal tenderness.  Musculoskeletal:        General: Normal range of motion.     Cervical back: Normal range of motion.     Right lower leg: Edema present.     Left lower leg: Edema present.  Skin:    General: Skin is warm and dry.  Neurological:     General: No focal deficit present.     Mental Status: He is alert.     ED Results and Treatments Labs (all labs ordered are listed, but only abnormal results are displayed) Labs Reviewed  COMPREHENSIVE METABOLIC PANEL - Abnormal; Notable for the following components:      Result Value   Glucose, Bld 115 (*)    BUN 29 (*)    Creatinine, Ser 3.24 (*)    Calcium 8.7 (*)    Albumin 2.7 (*)    GFR, Estimated 22 (*)    All other components within normal limits  CBC WITH DIFFERENTIAL/PLATELET - Abnormal; Notable for the following components:   RBC 3.96 (*)    Hemoglobin 11.2 (*)    HCT 35.3 (*)    All other components within normal limits  URINALYSIS, ROUTINE W REFLEX MICROSCOPIC - Abnormal; Notable for the following components:   Color, Urine STRAW (*)    Glucose, UA 50 (*)    Protein, ur >=300 (*)    Bacteria, UA RARE (*)    All other components within normal limits  BRAIN NATRIURETIC PEPTIDE - Abnormal; Notable for the following components:   B Natriuretic Peptide 649.8 (*)    All other components within normal limits  CBG MONITORING, ED - Abnormal; Notable for the following components:   Glucose-Capillary 108 (*)    All other components within normal limits  TROPONIN I (HIGH SENSITIVITY) - Abnormal; Notable for the following components:   Troponin I (High Sensitivity) 46 (*)    All other components within normal limits  TROPONIN I (HIGH SENSITIVITY) - Abnormal; Notable for the following components:   Troponin I (High Sensitivity) 49 (*)     All other components within normal limits  LIPASE, BLOOD  TSH  MAGNESIUM  HEMOGLOBIN C4U  BASIC METABOLIC PANEL  CBC  CBG MONITORING, ED                                                                                                                          Radiology DG Chest 2 View  Result Date: 12/17/2021 CLINICAL DATA:  Hypertension. EXAM: CHEST - 2 VIEW COMPARISON:  Chest radiograph dated 10/21/2010. FINDINGS: Shallow inspiration with minimal bibasilar atelectasis. Blunting of the costophrenic  angles may represent scarring versus trace pleural effusion. No focal consolidation, or pneumothorax. The cardiac silhouette is within limits. No acute osseous pathology. IMPRESSION: Shallow inspiration with minimal bibasilar atelectasis. Electronically Signed   By: Anner Crete M.D.   On: 12/17/2021 17:34    Pertinent labs & imaging results that were available during my care of the patient were reviewed by me and considered in my medical decision making (see MDM for details).  Medications Ordered in ED Medications  nitroGLYCERIN (NITROSTAT) SL tablet 0.4 mg (0.4 mg Sublingual Given 12/17/21 1749)  nicardipine (CARDENE) 40m in 0.86% saline 2031mIV infusion (0.1 mg/ml) (8 mg/hr Intravenous New Bag/Given 12/17/21 2149)  insulin aspart (novoLOG) injection 0-15 Units (has no administration in time range)  insulin aspart (novoLOG) injection 0-5 Units (has no administration in time range)  furosemide (LASIX) injection 40 mg (40 mg Intravenous Given 12/17/21 1749)                                                                                                                                     Procedures .Critical Care  Performed by: KoTeressa LowerMD Authorized by: KoTeressa LowerMD   Critical care provider statement:    Critical care time (minutes):  30   Critical care was necessary to treat or prevent imminent or life-threatening deterioration of the following conditions:  Respiratory  failure and cardiac failure (Hypertensive emergency)   Critical care was time spent personally by me on the following activities:  Development of treatment plan with patient or surrogate, discussions with consultants, evaluation of patient's response to treatment, examination of patient, ordering and review of laboratory studies, ordering and review of radiographic studies, ordering and performing treatments and interventions, pulse oximetry, re-evaluation of patient's condition and review of old charts   (including critical care time)  Medical Decision Making / ED Course   This patient presents to the ED for concern of shortness of breath, lower extremity edema, this involves an extensive number of treatment options, and is a complaint that carries with it a high risk of complications and morbidity.  The differential diagnosis includes hypertensive urgency, hypertensive emergency, CHF exacerbation, medication side effect, renal failure, liver failure  MDM: Seen emergency room for evaluation of shortness of breath, orthopnea and lower extremity edema.  Physical exam with bilateral lower extremity edema and mild rales at the bases but is otherwise unremarkable.  Laboratory evaluation with hemoglobin of 11.2, BUN 29, creatinine 3.24 which is near patient's baseline, albumin low at 2.7, BNP elevated to 649.8, urinalysis unremarkable initial troponin 46 with delta troponin relatively flat at 49.  Chest x-ray largely unremarkable and patient given 1 sublingual nitro tablet which improved his blood pressure minimally but he still had systolics greater than 20456nd was started on a nicardipine drip with a goal of a systolic of 19256or 2038%n 24 hours.  Patient then admitted to medicine for diuresis and  hypertensive emergency.   Additional history obtained: -Additional history obtained from family members which -External records from outside source obtained and reviewed including: Chart review including  previous notes, labs, imaging, consultation notes   Lab Tests: -I ordered, reviewed, and interpreted labs.   The pertinent results include:   Labs Reviewed  COMPREHENSIVE METABOLIC PANEL - Abnormal; Notable for the following components:      Result Value   Glucose, Bld 115 (*)    BUN 29 (*)    Creatinine, Ser 3.24 (*)    Calcium 8.7 (*)    Albumin 2.7 (*)    GFR, Estimated 22 (*)    All other components within normal limits  CBC WITH DIFFERENTIAL/PLATELET - Abnormal; Notable for the following components:   RBC 3.96 (*)    Hemoglobin 11.2 (*)    HCT 35.3 (*)    All other components within normal limits  URINALYSIS, ROUTINE W REFLEX MICROSCOPIC - Abnormal; Notable for the following components:   Color, Urine STRAW (*)    Glucose, UA 50 (*)    Protein, ur >=300 (*)    Bacteria, UA RARE (*)    All other components within normal limits  BRAIN NATRIURETIC PEPTIDE - Abnormal; Notable for the following components:   B Natriuretic Peptide 649.8 (*)    All other components within normal limits  CBG MONITORING, ED - Abnormal; Notable for the following components:   Glucose-Capillary 108 (*)    All other components within normal limits  TROPONIN I (HIGH SENSITIVITY) - Abnormal; Notable for the following components:   Troponin I (High Sensitivity) 46 (*)    All other components within normal limits  TROPONIN I (HIGH SENSITIVITY) - Abnormal; Notable for the following components:   Troponin I (High Sensitivity) 49 (*)    All other components within normal limits  LIPASE, BLOOD  TSH  MAGNESIUM  HEMOGLOBIN D1S  BASIC METABOLIC PANEL  CBC  CBG MONITORING, ED     Imaging Studies ordered: I ordered imaging studies including CXR I independently visualized and interpreted imaging. I agree with the radiologist interpretation   Medicines ordered and prescription drug management: Meds ordered this encounter  Medications   nitroGLYCERIN (NITROSTAT) SL tablet 0.4 mg   furosemide  (LASIX) injection 40 mg   nicardipine (CARDENE) 33m in 0.86% saline 2034mIV infusion (0.1 mg/ml)   insulin aspart (novoLOG) injection 0-15 Units    Order Specific Question:   Correction coverage:    Answer:   Moderate (average weight, post-op)    Order Specific Question:   CBG < 70:    Answer:   Implement Hypoglycemia Standing Orders and refer to Hypoglycemia Standing Orders sidebar report    Order Specific Question:   CBG 70 - 120:    Answer:   0 units    Order Specific Question:   CBG 121 - 150:    Answer:   2 units    Order Specific Question:   CBG 151 - 200:    Answer:   3 units    Order Specific Question:   CBG 201 - 250:    Answer:   5 units    Order Specific Question:   CBG 251 - 300:    Answer:   8 units    Order Specific Question:   CBG 301 - 350:    Answer:   11 units    Order Specific Question:   CBG 351 - 400:    Answer:   15 units  Order Specific Question:   CBG > 400    Answer:   call MD and obtain STAT lab verification   insulin aspart (novoLOG) injection 0-5 Units    Order Specific Question:   Correction coverage:    Answer:   HS scale    Order Specific Question:   CBG < 70:    Answer:   Implement Hypoglycemia Standing Orders and refer to Hypoglycemia Standing Orders sidebar report    Order Specific Question:   CBG 70 - 120:    Answer:   0 units    Order Specific Question:   CBG 121 - 150:    Answer:   0 units    Order Specific Question:   CBG 151 - 200:    Answer:   0 units    Order Specific Question:   CBG 201 - 250:    Answer:   2 units    Order Specific Question:   CBG 251 - 300:    Answer:   3 units    Order Specific Question:   CBG 301 - 350:    Answer:   4 units    Order Specific Question:   CBG 351 - 400:    Answer:   5 units    Order Specific Question:   CBG > 400    Answer:   call MD and obtain STAT lab verification    -I have reviewed the patients home medicines and have made adjustments as needed  Critical interventions Nicardipine drip,  lasix  Cardiac Monitoring: The patient was maintained on a cardiac monitor.  I personally viewed and interpreted the cardiac monitored which showed an underlying rhythm of: NSR  Social Determinants of Health:  Factors impacting patients care include: none   Reevaluation: After the interventions noted above, I reevaluated the patient and found that they have :improved  Co morbidities that complicate the patient evaluation  Past Medical History:  Diagnosis Date   Anxiety    Panic Atacks   Arthritis    Carpal tunnel syndrome    Constipation    Depression    Diabetes mellitus    DMII   Dysrhythmia    hx tachy hr   GERD (gastroesophageal reflux disease)    no problem in past few years.   Hepatitis    Hyperlipidemia    Hypertension    Neuropathy associated with endocrine disorder ([place])    Seizures ([place])    "stress related" no more since taking Klonopin 2 years- See Neurologist  [facility]   Shortness of breath dyspnea    with exertion   Sleep apnea    used a cpap when he was 350lb-does not need one now 260lb   Wears contact lenses       Dispostion: I considered admission for this patient, and due to hypertensive emergency and need for diuresis, patient require hospital admission     Final Clinical Impression(s) / ED Diagnoses Final diagnoses:  Hypertensive emergency     '@PCDICTATION' @    Teressa Lower, MD 12/17/21 2236

## 2021-12-17 NOTE — H&P (Incomplete)
Date: 12/17/2021               Patient Name:  [name] MRN: [id number]  DOB: 1965-08-29 Age / Sex: 56 y.o., male   PCP: Pcp, No         Medical Service: Internal Medicine Teaching Service         Attending Physician: Dr. Charise Killian, MD    First Contact: Leigh Aurora, DO      Pager: [phone]      Second Contact: Linwood Dibbles, MD      Pager: [phone]           After Hours (After 5p/  First Contact Pager: [phone]  weekends / holidays): Second Contact Pager: [phone]   SUBJECTIVE   Chief Complaint: leg swelling, orthopnea  History of Present Illness:   [name] is a 56 y.o. person living with a history of HTN, uncontrolled T2DM c/b neuropathy and charcot foot deformity, constipation who presented with BLE edema, orthopnea, and elevated BP.  He reports that he came in to the ED today because of increasing fluid in his legs, torso, and fingers.    States he came in because of fluid in his legs and began having SHOB, especially with laying flat. States he has fluid in legs and torso and even fingers were swollen to the point he could not wear rings. Swelling began about 2 months ago, first in his legs then his torso and then his fingers. He is having to wear larger clothes and belt feels tighter. Orthopnea recently started on Monday. States he gets out of breath walking from bedroom to kitchen (about 20-25 steps). Weight was 205 lbs at first visit in [facility], his weight today was 225 lbs. He has not been eating enough to gain this much weight.  He was out of BP medications for about 8 months. About 2 months ago, established care at [facility] and they have been trying to get BP under control. He was started on losartan initially but not getting to goal with it. It was stopped because of his kidney function. He is currently taking hydralazine twice a day (instead of prescribed TID dosing) and is taking carvedilol twice daily. His BP after taking these two medications,  his BP readings have been staying around 170s/90s.  States he has been having N/V since 2020. Feels like it mostly dry heaving, every morning for about 15-30 minutes. Rarely does anything come up, but if it does usually liquid. Thinks it may be GERD.   He has not been able to eat yesterday or today, has not taken medications during this time.  Was taken off of victoza because of his kidney function as well.   His A1c went from 7.1 to 7.6 during the time he was off of his medications. States he was taking lantus at home but not how it was prescribed. Was supposed to take 30u BID. If he ate well, would take 20u on days he had good appetite (once or twice a week).   States he has bowel movements once of twice a week, since 2020.   Had a gastric emptying study last year, was normal.   Meds:  Current Meds  Medication Sig  . carvedilol (COREG) 12.5 MG tablet Take 1 tablet (12.5 mg total) by mouth 2 (two) times daily with a meal.  . feeding supplement (BOOST HIGH PROTEIN) LIQD Take 1 Container by mouth 3 (three) times daily between meals.  . hydrALAZINE (APRESOLINE) 25  MG tablet Take 1 tablet (25 mg total) by mouth 3 (three) times daily. (Patient taking differently: Take 25 mg by mouth 2 (two) times daily.)  . rosuvastatin (CRESTOR) 20 MG tablet Take 1 tablet (20 mg total) by mouth daily.    Past Medical History  Past Surgical History:  Procedure Laterality Date  . COLONOSCOPY    . KNEE ARTHROSCOPY W/ MENISCAL REPAIR  2000   left  . LUMBAR LAMINECTOMY  2002    Social:  Social History   Socioeconomic History  . Marital status: Single    Spouse name: Not on file  . Number of children: Not on file  . Years of education: Not on file  . Highest education level: Not on file  Occupational History  . Not on file  Tobacco Use  . Smoking status: Former    Types: Cigarettes    Quit date: 10/27/2008    Years since quitting: 13.1  . Smokeless tobacco: Never  . Tobacco comments:    Quit  x 10 yrs.  Vaping Use  . Vaping Use: Never used  Substance and Sexual Activity  . Alcohol use: Not Currently    Comment: Beer sometimes.  . Drug use: No    Types: Marijuana  . Sexual activity: Not Currently    Partners: Male    Comment: last HIV test years ago.  Other Topics Concern  . Not on file  Social History Narrative  . Not on file   Social Determinants of Health   Financial Resource Strain: Not on file  Food Insecurity: Not on file  Transportation Needs: Not on file  Physical Activity: Not on file  Stress: Not on file  Social Connections: Not on file  Intimate Partner Violence: Not on file     Family History:  Family History  Problem Relation Age of Onset  . Cancer Mother        pancres  . Diabetes Mother   . Hypertension Brother   . Renal cancer Brother   . Cancer Maternal Uncle   . Colon cancer Maternal Uncle   . Colon cancer Maternal Uncle   . Stomach cancer Maternal Uncle   . Esophageal cancer Neg Hx   . Rectal cancer Neg Hx    Allergies: Allergies as of 12/17/2021 - Review Complete 12/17/2021  Allergen Reaction Noted  . Amlodipine Other (See Comments) 11/18/2021  . Lisinopril Swelling and Other (See Comments) 12/17/2021    Review of Systems: A complete ROS was negative except as per HPI.   OBJECTIVE:   Physical Exam: Blood pressure (!) 197/94, pulse (!) 103, temperature 98.6 F (37 C), resp. rate 18, height 5\' 10"  (1.778 m), weight 103.4 kg, SpO2 98 %.   General: pleasant middle-aged male, sitting comfortably in bed, cooperative, NAD. HENT: normocephalic and atraumatic, moist oral mucosa. Eyes: PERRL, EOMI. Cardiac: tachycardic rate with regular rhythm, systolic murmur best appreciated at LUSB. 2-3+ pitting edema up to knees bilaterally. JVD up to ear. Pulm: mild bibasilar crackles noted. Normal respiratory effort, saturating well on RA. Abdomen: soft, nondistended, hypoactive bowel sounds, nontender to palpation.  MSK: normal bulk and  tone. Charcot deformity noted on R foot. Large callus on distal great L toe. Neuro: AAOx3, no acute focal deficits noted. Absent sensation in 1st and 2nd digits of bilateral LE.  Psych: normal mood and affect.   Labs: CBC    Component Value Date/Time   WBC 7.3 12/17/2021 1650   RBC 3.96 (L) 12/17/2021 1650   HGB  11.2 (L) 12/17/2021 1650   HGB 12.3 (L) 11/07/2021 1118   HCT 35.3 (L) 12/17/2021 1650   HCT 37.3 (L) 11/07/2021 1118   PLT 176 12/17/2021 1650   PLT 163 11/07/2021 1118   MCV 89.1 12/17/2021 1650   MCV 85 11/07/2021 1118   MCH 28.3 12/17/2021 1650   MCHC 31.7 12/17/2021 1650   RDW 14.2 12/17/2021 1650   RDW 13.6 11/07/2021 1118   LYMPHSABS 2.2 12/17/2021 1650   LYMPHSABS 2.6 05/10/2019 1021   MONOABS 0.5 12/17/2021 1650   EOSABS 0.2 12/17/2021 1650   EOSABS 0.3 05/10/2019 1021   BASOSABS 0.1 12/17/2021 1650   BASOSABS 0.1 05/10/2019 1021     CMP     Component Value Date/Time   NA 138 12/17/2021 1650   NA 138 11/07/2021 1118   K 4.2 12/17/2021 1650   CL 109 12/17/2021 1650   CO2 22 12/17/2021 1650   GLUCOSE 115 (H) 12/17/2021 1650   BUN 29 (H) 12/17/2021 1650   BUN 39 (H) 11/07/2021 1118   CREATININE 3.24 (H) 12/17/2021 1650   CREATININE 1.31 11/21/2019 1445   CALCIUM 8.7 (L) 12/17/2021 1650   PROT 6.9 12/17/2021 1650   PROT 7.1 05/10/2019 1021   ALBUMIN 2.7 (L) 12/17/2021 1650   ALBUMIN 3.3 (L) 05/10/2019 1021   AST 21 12/17/2021 1650   ALT 11 12/17/2021 1650   ALT 57 (H) 05/16/2019 0958   ALKPHOS 61 12/17/2021 1650   BILITOT 0.7 12/17/2021 1650   BILITOT 0.2 05/10/2019 1021   GFRNONAA 22 (L) 12/17/2021 1650   GFRNONAA >89 01/29/2016 0925   GFRAA 101 12/06/2019 1357   GFRAA >89 01/29/2016 0925    Imaging: DG Chest 2 View  Result Date: 12/17/2021 CLINICAL DATA:  Hypertension. EXAM: CHEST - 2 VIEW COMPARISON:  Chest radiograph dated 10/21/2010. FINDINGS: Shallow inspiration with minimal bibasilar atelectasis. Blunting of the costophrenic angles  may represent scarring versus trace pleural effusion. No focal consolidation, or pneumothorax. The cardiac silhouette is within limits. No acute osseous pathology. IMPRESSION: Shallow inspiration with minimal bibasilar atelectasis. Electronically Signed   By: Anner Crete M.D.   On: 12/17/2021 17:34     EKG: personally reviewed my interpretation is NSR with some LVH.  ASSESSMENT & PLAN:   Assessment & Plan by Problem: Principal Problem:   Hypertensive emergency Active Problems:   Uncontrolled type 2 diabetes mellitus with hyperglycemia (HCC)   Essential hypertension   Diabetic neuropathy (HCC)   Charcot foot due to diabetes mellitus ([place])   Lower extremity edema   Gonzalo Waymire Fettes is a 56 y.o. person living with a history of HTN, uncontrolled T2DM c/b neuropathy and charcot foot deformity, constipation who presented with BLE edema, orthopnea, and elevated BP and admitted for hypertensive emergency on hospital day 0.   #Hypertensive emergency #Essential hypertension ***   #*** ***   #*** ***     Diet: HH/CM VTE: Enoxaparin IVF: None,None Code: Full  Prior to Admission Living Arrangement: Home, living alone Anticipated Discharge Location: Home Barriers to Discharge: BP control, CHF workup  Dispo: Admit patient to Observation with expected length of stay less than 2 midnights.  Signed: Virl Axe, MD Internal Medicine Resident PGY-3 12/17/2021, 11:35 PM

## 2021-12-17 NOTE — Progress Notes (Signed)
   CC: leg swelling  HPI:  [name] is a 56 y.o.-year-old malewith past medical history as below presenting for leg swelling.  Please see encounters tab for problem-based charting.  Past Medical History:  Diagnosis Date   Anxiety    Panic Atacks   Arthritis    Carpal tunnel syndrome    Constipation    Depression    Diabetes mellitus    DMII   Dysrhythmia    hx tachy hr   GERD (gastroesophageal reflux disease)    no problem in past few years.   Hepatitis    Hyperlipidemia    Hypertension    Neuropathy associated with endocrine disorder ([place])    Seizures ([place])    "stress related" no more since taking Klonopin 2 years- See Neurologist  [facility]   Shortness of breath dyspnea    with exertion   Sleep apnea    used a cpap when he was 350lb-does not need one now 260lb   Wears contact lenses    Review of Systems: As in HPI.  Please see encounters tab for problem based charting.   Physical Exam:  Vitals:   12/17/21 1358 12/17/21 1430  BP: (!) 226/114 (!) 223/125  Pulse: 86 79  Temp: 98.4 F (36.9 C)   TempSrc: Oral   SpO2: 100%   Weight: 228 lb (103.4 kg)    General: NAD Cardiac: RRR, S3 present. JVD to level of mandible Respiratory: Normal work of breathing on room air, diminished breath sounds at R>L lung base, no crackles Extremities: 2+ pitting edema above level of knees, extremities warm  Assessment & Plan:   Lower extremity edema Worsening BLE edema which has now progressed up groin, into belly and swelling in hands too. Dyspnea with mild exertion, dressing, walking down hallway. Endorses orthopnea and PND. 10+ lbs gained in past couple weeks. No chest pain or headache.  On coreg 12.5 bid and avalide 150-12.5 bid and taking those except for last 2 days because of GI bug. Typically only takes hydral 25 BID instead of TID. Didn't take his blood pressure meds for about 8 months d\t lack on insurance. Has had prior episodes of leg swelling which  resolved with taking his BP meds. Has had AKI at recent appts and marked proteinuria. On exam today, had diminished breath sounds at R lung bases and 2+ pitting edema up through groin with JVD up to level of jaw. NAD.   ddx includes decompensated HF, severe symptomatic htn, nephropathy driving volume overload and HTN. Will transfer to ED for management and further workup.   Patient seen with Dr. Daryll Drown

## 2021-12-17 NOTE — ED Triage Notes (Signed)
The pt has had high bp for 3-4 months  he went down to the clinic here and they sent him to the urgwnt care??? He reports fluid build up aldo  no shortness of breqath

## 2021-12-17 NOTE — Assessment & Plan Note (Addendum)
Worsening BLE edema which has now progressed up groin, into belly and swelling in hands too. Dyspnea with mild exertion, dressing, walking down hallway. Endorses orthopnea and PND. 10+ lbs gained in past couple weeks. No chest pain or headache.  On coreg 12.5 bid and avalide 150-12.5 bid and taking those except for last 2 days because of GI bug. Typically only takes hydral 25 BID instead of TID. Didn't take his blood pressure meds for about 8 months d\t lack on insurance. Has had prior episodes of leg swelling which resolved with taking his BP meds. Has had AKI at recent appts and marked proteinuria. On exam today, had diminished breath sounds at R lung bases and 2+ pitting edema up through groin with JVD up to level of mandible. BP was 223/125 after recheck. Patient is in NAD.   ddx includes acute decompensated HF, severe symptomatic htn, nephropathy driving volume overload and HTN. Will transfer to ED for management and further workup.

## 2021-12-18 ENCOUNTER — Observation Stay (HOSPITAL_COMMUNITY): Payer: Commercial Managed Care - HMO

## 2021-12-18 ENCOUNTER — Inpatient Hospital Stay (HOSPITAL_COMMUNITY): Payer: Commercial Managed Care - HMO

## 2021-12-18 DIAGNOSIS — N179 Acute kidney failure, unspecified: Secondary | ICD-10-CM | POA: Diagnosis present

## 2021-12-18 DIAGNOSIS — R0602 Shortness of breath: Secondary | ICD-10-CM | POA: Diagnosis present

## 2021-12-18 DIAGNOSIS — Z79899 Other long term (current) drug therapy: Secondary | ICD-10-CM | POA: Diagnosis not present

## 2021-12-18 DIAGNOSIS — M1990 Unspecified osteoarthritis, unspecified site: Secondary | ICD-10-CM | POA: Diagnosis present

## 2021-12-18 DIAGNOSIS — R748 Abnormal levels of other serum enzymes: Secondary | ICD-10-CM

## 2021-12-18 DIAGNOSIS — Z800 Family history of malignant neoplasm of digestive organs: Secondary | ICD-10-CM | POA: Diagnosis not present

## 2021-12-18 DIAGNOSIS — I5031 Acute diastolic (congestive) heart failure: Secondary | ICD-10-CM | POA: Diagnosis present

## 2021-12-18 DIAGNOSIS — I161 Hypertensive emergency: Secondary | ICD-10-CM

## 2021-12-18 DIAGNOSIS — E1165 Type 2 diabetes mellitus with hyperglycemia: Secondary | ICD-10-CM | POA: Diagnosis present

## 2021-12-18 DIAGNOSIS — Z888 Allergy status to other drugs, medicaments and biological substances status: Secondary | ICD-10-CM | POA: Diagnosis not present

## 2021-12-18 DIAGNOSIS — E1140 Type 2 diabetes mellitus with diabetic neuropathy, unspecified: Secondary | ICD-10-CM | POA: Diagnosis present

## 2021-12-18 DIAGNOSIS — Z87891 Personal history of nicotine dependence: Secondary | ICD-10-CM | POA: Diagnosis not present

## 2021-12-18 DIAGNOSIS — Z8249 Family history of ischemic heart disease and other diseases of the circulatory system: Secondary | ICD-10-CM | POA: Diagnosis not present

## 2021-12-18 DIAGNOSIS — I21A1 Myocardial infarction type 2: Secondary | ICD-10-CM | POA: Diagnosis present

## 2021-12-18 DIAGNOSIS — K5900 Constipation, unspecified: Secondary | ICD-10-CM | POA: Diagnosis present

## 2021-12-18 DIAGNOSIS — E785 Hyperlipidemia, unspecified: Secondary | ICD-10-CM | POA: Diagnosis present

## 2021-12-18 DIAGNOSIS — I509 Heart failure, unspecified: Secondary | ICD-10-CM | POA: Diagnosis not present

## 2021-12-18 DIAGNOSIS — Z794 Long term (current) use of insulin: Secondary | ICD-10-CM | POA: Diagnosis not present

## 2021-12-18 DIAGNOSIS — E11610 Type 2 diabetes mellitus with diabetic neuropathic arthropathy: Secondary | ICD-10-CM | POA: Diagnosis present

## 2021-12-18 DIAGNOSIS — N184 Chronic kidney disease, stage 4 (severe): Secondary | ICD-10-CM | POA: Diagnosis present

## 2021-12-18 DIAGNOSIS — Z833 Family history of diabetes mellitus: Secondary | ICD-10-CM | POA: Diagnosis not present

## 2021-12-18 DIAGNOSIS — E1122 Type 2 diabetes mellitus with diabetic chronic kidney disease: Secondary | ICD-10-CM | POA: Diagnosis present

## 2021-12-18 DIAGNOSIS — I130 Hypertensive heart and chronic kidney disease with heart failure and stage 1 through stage 4 chronic kidney disease, or unspecified chronic kidney disease: Secondary | ICD-10-CM | POA: Diagnosis present

## 2021-12-18 DIAGNOSIS — Z8051 Family history of malignant neoplasm of kidney: Secondary | ICD-10-CM | POA: Diagnosis not present

## 2021-12-18 DIAGNOSIS — I5020 Unspecified systolic (congestive) heart failure: Secondary | ICD-10-CM | POA: Diagnosis not present

## 2021-12-18 LAB — IRON AND TIBC
Iron: 69 ug/dL (ref 45–182)
Saturation Ratios: 26 % (ref 17.9–39.5)
TIBC: 269 ug/dL (ref 250–450)
UIBC: 200 ug/dL

## 2021-12-18 LAB — CBC
HCT: 29.8 % — ABNORMAL LOW (ref 39.0–52.0)
Hemoglobin: 9.8 g/dL — ABNORMAL LOW (ref 13.0–17.0)
MCH: 29 pg (ref 26.0–34.0)
MCHC: 32.9 g/dL (ref 30.0–36.0)
MCV: 88.2 fL (ref 80.0–100.0)
Platelets: 133 10*3/uL — ABNORMAL LOW (ref 150–400)
RBC: 3.38 MIL/uL — ABNORMAL LOW (ref 4.22–5.81)
RDW: 14 % (ref 11.5–15.5)
WBC: 6.2 10*3/uL (ref 4.0–10.5)
nRBC: 0 % (ref 0.0–0.2)

## 2021-12-18 LAB — BASIC METABOLIC PANEL
Anion gap: 10 (ref 5–15)
BUN: 30 mg/dL — ABNORMAL HIGH (ref 6–20)
CO2: 19 mmol/L — ABNORMAL LOW (ref 22–32)
Calcium: 8.5 mg/dL — ABNORMAL LOW (ref 8.9–10.3)
Chloride: 110 mmol/L (ref 98–111)
Creatinine, Ser: 3.14 mg/dL — ABNORMAL HIGH (ref 0.61–1.24)
GFR, Estimated: 23 mL/min — ABNORMAL LOW (ref >60)
Glucose, Bld: 134 mg/dL — ABNORMAL HIGH (ref 70–99)
Potassium: 4 mmol/L (ref 3.5–5.1)
Sodium: 139 mmol/L (ref 135–145)

## 2021-12-18 LAB — ECHOCARDIOGRAM COMPLETE
AR max vel: 2.7 cm2
AV Area VTI: 2.85 cm2
AV Area mean vel: 2.64 cm2
AV Mean grad: 4 mmHg
AV Peak grad: 6.6 mmHg
Ao pk vel: 1.28 m/s
Area-P 1/2: 4.33 cm2
Height: 70 in
S' Lateral: 3.4 cm
Weight: 3647.29 oz

## 2021-12-18 LAB — HEMOGLOBIN A1C
Hgb A1c MFr Bld: 6.9 % — ABNORMAL HIGH (ref 4.8–5.6)
Mean Plasma Glucose: 151.33 mg/dL

## 2021-12-18 LAB — CBG MONITORING, ED
Glucose-Capillary: 119 mg/dL — ABNORMAL HIGH (ref 70–99)
Glucose-Capillary: 121 mg/dL — ABNORMAL HIGH (ref 70–99)

## 2021-12-18 LAB — TROPONIN I (HIGH SENSITIVITY)
Troponin I (High Sensitivity): 78 ng/L — ABNORMAL HIGH (ref <18)
Troponin I (High Sensitivity): 87 ng/L — ABNORMAL HIGH (ref <18)
Troponin I (High Sensitivity): 81 ng/L — ABNORMAL HIGH (ref <18)

## 2021-12-18 LAB — GLUCOSE, CAPILLARY: Glucose-Capillary: 123 mg/dL — ABNORMAL HIGH (ref 70–99)

## 2021-12-18 LAB — FERRITIN: Ferritin: 101 ng/mL (ref 24–336)

## 2021-12-18 LAB — RETICULOCYTES
Immature Retic Fract: 7.1 % (ref 2.3–15.9)
RBC.: 3.68 MIL/uL — ABNORMAL LOW (ref 4.22–5.81)
Retic Count, Absolute: 56.3 10*3/uL (ref 19.0–186.0)
Retic Ct Pct: 1.5 % (ref 0.4–3.1)

## 2021-12-18 LAB — MAGNESIUM: Magnesium: 1.5 mg/dL — ABNORMAL LOW (ref 1.7–2.4)

## 2021-12-18 LAB — VITAMIN B12: Vitamin B-12: 351 pg/mL (ref 180–914)

## 2021-12-18 LAB — TSH: TSH: 2.797 u[IU]/mL (ref 0.350–4.500)

## 2021-12-18 MED ORDER — CARVEDILOL 12.5 MG PO TABS
12.5000 mg | ORAL_TABLET | Freq: Two times a day (BID) | ORAL | Status: DC
  Administered 2021-12-18 (×2): 12.5 mg via ORAL
  Filled 2021-12-18 (×2): qty 1

## 2021-12-18 MED ORDER — FUROSEMIDE 10 MG/ML IJ SOLN
40.0000 mg | Freq: Once | INTRAMUSCULAR | Status: AC
  Administered 2021-12-18: 40 mg via INTRAVENOUS
  Filled 2021-12-18: qty 4

## 2021-12-18 MED ORDER — FUROSEMIDE 10 MG/ML IJ SOLN
40.0000 mg | Freq: Every day | INTRAMUSCULAR | Status: DC
  Administered 2021-12-18: 40 mg via INTRAVENOUS
  Filled 2021-12-18: qty 4

## 2021-12-18 MED ORDER — HYDRALAZINE HCL 25 MG PO TABS
25.0000 mg | ORAL_TABLET | Freq: Two times a day (BID) | ORAL | Status: DC
  Administered 2021-12-18: 25 mg via ORAL
  Filled 2021-12-18: qty 1

## 2021-12-18 MED ORDER — POLYETHYLENE GLYCOL 3350 17 G PO PACK
17.0000 g | PACK | Freq: Every day | ORAL | Status: DC
  Administered 2021-12-18 – 2021-12-19 (×2): 17 g via ORAL
  Filled 2021-12-18 (×2): qty 1

## 2021-12-18 MED ORDER — FUROSEMIDE 10 MG/ML IJ SOLN: 40.0000 mg | Freq: Every day | INTRAMUSCULAR | Status: DC

## 2021-12-18 MED ORDER — SENNOSIDES-DOCUSATE SODIUM 8.6-50 MG PO TABS: 1.0000 | ORAL_TABLET | Freq: Every evening | ORAL | Status: DC | PRN

## 2021-12-18 MED ORDER — CARVEDILOL 25 MG PO TABS
25.0000 mg | ORAL_TABLET | Freq: Two times a day (BID) | ORAL | Status: DC
  Administered 2021-12-19 – 2021-12-20 (×3): 25 mg via ORAL
  Filled 2021-12-18 (×3): qty 1

## 2021-12-18 MED ORDER — MAGNESIUM SULFATE 2 GM/50ML IV SOLN
2.0000 g | Freq: Once | INTRAVENOUS | Status: AC
  Administered 2021-12-18: 2 g via INTRAVENOUS
  Filled 2021-12-18: qty 50

## 2021-12-18 MED ORDER — FUROSEMIDE 10 MG/ML IJ SOLN: 40.0000 mg | Freq: Two times a day (BID) | INTRAMUSCULAR | Status: DC

## 2021-12-18 MED ORDER — HYDRALAZINE HCL 50 MG PO TABS
50.0000 mg | ORAL_TABLET | Freq: Two times a day (BID) | ORAL | Status: DC
  Administered 2021-12-18 – 2021-12-19 (×2): 50 mg via ORAL
  Filled 2021-12-18 (×2): qty 1

## 2021-12-18 NOTE — Progress Notes (Signed)
Patient brought to 4E from ED. Telemetry box applied, CCMD notified. Patient oriented to room and staff. Call bell in reach.  Daymon Larsen, RN

## 2021-12-18 NOTE — ED Notes (Signed)
Per MD [place] goal for BP at this time is SBP 150-160.

## 2021-12-18 NOTE — Evaluation (Signed)
Physical Therapy Evaluation Patient Details Name: [name] MRN: [id number] DOB: 1965-07-16 Today's Date: 12/18/2021  History of Present Illness  Pt is a 56 y/o male admitted secondary to LE edema and SOB. Found to have hypertensive emergency. PMH includes charcot foot on the R, DM, and HTN.  Clinical Impression  Pt admitted secondary to problem above with deficits below. Mild instability noted and required min guard A for mobility tasks. Discussed using cane for mobility for increased safety. Anticipate pt will progress well and will not require PT follow up. Will continue to follow acutely.        Recommendations for follow up therapy are one component of a multi-disciplinary discharge planning process, led by the attending physician.  Recommendations may be updated based on patient status, additional functional criteria and insurance authorization.  Follow Up Recommendations No PT follow up      Assistance Recommended at Discharge Intermittent Supervision/Assistance  Patient can return home with the following  Assistance with cooking/housework    Equipment Recommendations Cane  Recommendations for Other Services       Functional Status Assessment Patient has had a recent decline in their functional status and demonstrates the ability to make significant improvements in function in a reasonable and predictable amount of time.     Precautions / Restrictions Precautions Precautions: Fall Restrictions Weight Bearing Restrictions: No      Mobility  Bed Mobility Overal bed mobility: Modified Independent                  Transfers Overall transfer level: Needs assistance Equipment used: None Transfers: Sit to/from Stand Sit to Stand: Min guard           General transfer comment: Min guard for safety. Mild LOB upon standing.    Ambulation/Gait Ambulation/Gait assistance: Min guard Gait Distance (Feet): 150 Feet Assistive device: None Gait  Pattern/deviations: Step-through pattern, Decreased stride length Gait velocity: Decreased     General Gait Details: Occasionally reaching out to rail for support. 1 LOB noted, but did not require physical assist to correct. Min guard for safety.  Stairs            Wheelchair Mobility    Modified Rankin (Stroke Patients Only)       Balance Overall balance assessment: Needs assistance Sitting-balance support: No upper extremity supported, Feet supported Sitting balance-Leahy Scale: Fair     Standing balance support: No upper extremity supported, During functional activity Standing balance-Leahy Scale: Fair                               Pertinent Vitals/Pain Pain Assessment Pain Assessment: Faces Faces Pain Scale: Hurts little more Pain Location: LLE and back Pain Descriptors / Indicators: Guarding Pain Intervention(s): Limited activity within patient's tolerance, Monitored during session, Repositioned    Home Living Family/patient expects to be discharged to:: Private residence Living Arrangements: Other relatives (cousin) Available Help at Discharge: Family Type of Home: Mobile home Home Access: Stairs to enter Entrance Stairs-Rails: Left Entrance Stairs-Number of Steps: 5   Home Layout: One level Home Equipment: Cane - single point      Prior Function Prior Level of Function : Independent/Modified Independent;Driving             Mobility Comments: Uses buggy when at the store, but otherwise independent ADLs Comments: Independent with ADLs     Hand Dominance        Extremity/Trunk Assessment  Upper Extremity Assessment Upper Extremity Assessment: Overall WFL for tasks assessed    Lower Extremity Assessment Lower Extremity Assessment: Generalized weakness;RLE deficits/detail RLE Deficits / Details: Charcot foot at baseline    Cervical / Trunk Assessment Cervical / Trunk Assessment: Normal  Communication   Communication: No  difficulties  Cognition Arousal/Alertness: Awake/alert Behavior During Therapy: WFL for tasks assessed/performed Overall Cognitive Status: Within Functional Limits for tasks assessed                                          General Comments      Exercises     Assessment/Plan    PT Assessment Patient needs continued PT services  PT Problem List Decreased activity tolerance;Decreased balance;Decreased mobility;Decreased strength       PT Treatment Interventions DME instruction;Gait training;Stair training;Functional mobility training;Therapeutic activities;Therapeutic exercise;Balance training;Patient/family education    PT Goals (Current goals can be found in the Care Plan section)  Acute Rehab PT Goals Patient Stated Goal: to go home PT Goal Formulation: With patient Time For Goal Achievement: 01/01/22 Potential to Achieve Goals: Good    Frequency Min 2X/week     Co-evaluation               AM-PAC PT "6 Clicks" Mobility  Outcome Measure Help needed turning from your back to your side while in a flat bed without using bedrails?: None Help needed moving from lying on your back to sitting on the side of a flat bed without using bedrails?: None Help needed moving to and from a bed to a chair (including a wheelchair)?: A Little Help needed standing up from a chair using your arms (e.g., wheelchair or bedside chair)?: A Little Help needed to walk in hospital room?: A Little Help needed climbing 3-5 steps with a railing? : A Little 6 Click Score: 20    End of Session   Activity Tolerance: Patient tolerated treatment well Patient left: in bed;with call bell/phone within reach (on stretcher in ED) Nurse Communication: Mobility status PT Visit Diagnosis: Unsteadiness on feet (R26.81);Other abnormalities of gait and mobility (R26.89)    Time: 6468-0321 PT Time Calculation (min) (ACUTE ONLY): 15 min   Charges:   PT Evaluation $PT Eval Low  Complexity: 1 Low          Reuel Derby, PT, DPT  Acute Rehabilitation Services  Office: [phone]   Rudean Hitt 12/18/2021, 3:44 PM

## 2021-12-18 NOTE — ED Notes (Signed)
Went to room to take patient, echo at bedside. Will transfer upstairs after echo

## 2021-12-18 NOTE — Progress Notes (Addendum)
HD#0 Subjective:   Summary: This is a 56 year old male with a past medical history of hypertension, uncontrolled type 2 diabetes, neuropathy, Charcot foot deformity, constipation who presented with concerns of shortness of breath, and fluid retention admitted for severe symptomatic hypertension.  Overnight Events: No overnight events  Patient is resting comfortably in bed upon my exam.  He denies any shortness of breath with rest.  He does report shortness of breath with ambulation.  He currently denies any chest pain.  He reports that he thinks his leg swelling has decreased since being in the emergency room.  He does report some nausea.  He denies any vomiting.  He states no other concerns at this time.  Objective:  Vital signs in last 24 hours: Vitals:   12/18/21 1100 12/18/21 1200 12/18/21 1227 12/18/21 1315  BP: (!) 161/94 (!) 171/93  (!) 168/94  Pulse: 86 92  89  Resp: 16 (!) 22  13  Temp:   98.5 F (36.9 C)   TempSrc:      SpO2: 100% 100%  99%  Weight:      Height:       Supplemental O2: Room Air SpO2: 99 %   Physical Exam:  Constitutional: Well-appearing and resting comfortably in bed in no acute distress. HENT: normocephalic atraumatic Neck: JVD noted up to right ear lobe. Cardiovascular: regular rate and rhythm, 3/6 systolic murmur heard best at LLSB, possible S4, 2+ pedal pulses to the bilateral DP pulses Pulmonary/Chest: normal work of breathing on room air, mild crackles noted to bilateral lower lung bases Skin: warm and dry Extremities: There is 2+ pitting edema noted to bilateral lower extremities  Filed Weights   12/17/21 1627  Weight: 103.4 kg     Intake/Output Summary (Last 24 hours) at 12/18/2021 1424 Last data filed at 12/18/2021 1540 Gross per 24 hour  Intake 50 ml  Output 3050 ml  Net -3000 ml   Net IO Since Admission: -3,000 mL [12/18/21 1424]  Pertinent Labs:    Latest Ref Rng & Units 12/18/2021    2:32 AM 12/17/2021    4:50 PM 11/07/2021    11:18 AM  CBC  WBC 4.0 - 10.5 K/uL 6.2  7.3  7.4   Hemoglobin 13.0 - 17.0 g/dL 9.8  11.2  12.3   Hematocrit 39.0 - 52.0 % 29.8  35.3  37.3   Platelets 150 - 400 K/uL 133  176  163        Latest Ref Rng & Units 12/18/2021    2:32 AM 12/17/2021    4:50 PM 11/07/2021   11:18 AM  CMP  Glucose 70 - 99 mg/dL 134  115  184   BUN 6 - 20 mg/dL 30  29  39   Creatinine 0.61 - 1.24 mg/dL 3.14  3.24  3.44   Sodium 135 - 145 mmol/L 139  138  138   Potassium 3.5 - 5.1 mmol/L 4.0  4.2  4.2   Chloride 98 - 111 mmol/L 110  109  101   CO2 22 - 32 mmol/L 19  22  21    Calcium 8.9 - 10.3 mg/dL 8.5  8.7  8.6   Total Protein 6.5 - 8.1 g/dL  6.9    Total Bilirubin 0.3 - 1.2 mg/dL  0.7    Alkaline Phos 38 - 126 U/L  61    AST 15 - 41 U/L  21    ALT 0 - 44 U/L  11  Imaging: US RENAL  Result Date: 12/18/2021 CLINICAL DATA:  AK I EXAM: RENAL / URINARY TRACT ULTRASOUND COMPLETE COMPARISON:  CT abdomen and pelvis 01/18/2020 FINDINGS: Right Kidney: Renal measurements: 12.9 x 5.2 x 5.7 cm = volume: 202 mL. Echogenicity within normal limits. No mass or hydronephrosis visualized. Left Kidney: Renal measurements: 12.1 x 5.9 x 5.2 cm = volume: 194 mL. Echogenicity within normal limits. No mass or hydronephrosis visualized. Bladder: Appears normal for degree of bladder distention. Other: None. IMPRESSION: Unremarkable renal ultrasound. Electronically Signed   By: Placido Sou M.D.   On: 12/18/2021 01:36   DG Chest 2 View  Result Date: 12/17/2021 CLINICAL DATA:  Hypertension. EXAM: CHEST - 2 VIEW COMPARISON:  Chest radiograph dated 10/21/2010. FINDINGS: Shallow inspiration with minimal bibasilar atelectasis. Blunting of the costophrenic angles may represent scarring versus trace pleural effusion. No focal consolidation, or pneumothorax. The cardiac silhouette is within limits. No acute osseous pathology. IMPRESSION: Shallow inspiration with minimal bibasilar atelectasis. Electronically Signed   By: Anner Crete M.D.   On: 12/17/2021 17:34    Assessment/Plan:   Principal Problem:   Hypertensive emergency Active Problems:   Uncontrolled type 2 diabetes mellitus with hyperglycemia (HCC)   Essential hypertension   Diabetic neuropathy ([place])   Charcot foot due to diabetes mellitus ([place])   Lower extremity edema   Patient Summary: [name] is a 56 y.o. with a pertinent PMH of hypertension, uncontrolled type 2 diabetes, neuropathy, Charcot foot deformity, constipation presenting with concerns of fluid retention and shortness of breath admitted for severe symptomatic hypertension.  #Severe symptomatic hypertension, resolving #Elevated troponins Patient has been weaned off of Cardene drip and started back on home Coreg 12.5 mg twice daily and hydralazine 50 mg twice daily.  Blood pressures remain elevated in the 150s to 160s, and this is not at goal.  Patient is being diuresed with IV Lasix 40 mg x2.  Troponins have seem to peak at 87, with the most recent one at 81.  Plan is to continue to monitor blood pressure and continue to uptitrate Coreg and hydralazine for blood pressure control in the 120s. -Continue Coreg 12.5 mg twice daily, and uptitrate as needed -Continue hydralazine 50 mg twice daily, and uptitrate as needed -Continue IV Lasix 40 mg x2 today  -Monitor blood pressure  #Shortness of breath Likely related to severe symptomatic hypertension and likely a component of fluid overload. This could be related to heart failure versus nephropathy causing volume overload.  Patient's shortness of breath is resolving with diuresis.  There are only mild crackles heard on exam. TTE pending. -Continue to monitor O2 saturation -Continue to diuresis -Strict I's and O's -Daily weights -Echo pending -Supplemental oxygen as as needed to keep saturations greater than 92%  #AKI on suspected CKD, unclear baseline stage This could be related to severe symptomatic hypertension.  This could also  be related to diabetes. Patient still continues to make urine, and is having good output with diuresis.  Continue to monitor BMP -Continue to monitor BMP -Continue to monitor output  #Chronic nausea and vomiting Patient has chronic nausea and vomiting.  He has been worked up outpatient with gastric emptying study, colonoscopy, EGD, and CT scan.  No etiology found.  Patient continues to be nauseous today, but resolves with Zofran.  No vomiting recorded. -Continue Zofran as needed -Continue to monitor for nausea and vomiting  #Type 2 diabetes mellitus #Diabetic neuropathy #Diabetic nephropathy Patient's A1c 6.9 on admission.  Patient reports that he  has not been eating or drinking well and this could be contributing to his A1c being down.  Patient is not on any glucose controlling regimen at home and was previously on Lantus and Victoza.  There is concern that he could be having diabetic nephropathy given that patient had low albumin as well as protein in his urine on admission.  He also was concerned about swelling not just to his lower extremities, but also to his upper extremities and facial region.  On exam, there is noticeable swelling to his lower extremities, but no swelling is appreciated on his face nor on his upper extremities.  Initial albumin was 2.7.  Blood glucose ranging from 108-124. -Continue to monitor blood sugars in hospital -Continue sliding scale insulin -Microalbumin/creatinine ratio pending -Continue to monitor for worsening swelling   Diet: Carb-Modified IVF: None VTE: Enoxaparin Code: Full   Dispo: Anticipated discharge to Home in 2 days pending Clinical improvement.   [place] [facility] Resident PGY-1 [phone] Please contact the on call pager after 5 pm and on weekends at [phone].

## 2021-12-19 ENCOUNTER — Other Ambulatory Visit (HOSPITAL_COMMUNITY): Payer: Self-pay

## 2021-12-19 DIAGNOSIS — Z794 Long term (current) use of insulin: Secondary | ICD-10-CM

## 2021-12-19 DIAGNOSIS — N179 Acute kidney failure, unspecified: Secondary | ICD-10-CM

## 2021-12-19 DIAGNOSIS — E1140 Type 2 diabetes mellitus with diabetic neuropathy, unspecified: Secondary | ICD-10-CM

## 2021-12-19 DIAGNOSIS — I5020 Unspecified systolic (congestive) heart failure: Secondary | ICD-10-CM

## 2021-12-19 DIAGNOSIS — I130 Hypertensive heart and chronic kidney disease with heart failure and stage 1 through stage 4 chronic kidney disease, or unspecified chronic kidney disease: Secondary | ICD-10-CM | POA: Diagnosis not present

## 2021-12-19 DIAGNOSIS — I161 Hypertensive emergency: Secondary | ICD-10-CM | POA: Diagnosis not present

## 2021-12-19 DIAGNOSIS — E1121 Type 2 diabetes mellitus with diabetic nephropathy: Secondary | ICD-10-CM

## 2021-12-19 DIAGNOSIS — N189 Chronic kidney disease, unspecified: Secondary | ICD-10-CM

## 2021-12-19 LAB — GLUCOSE, CAPILLARY
Glucose-Capillary: 175 mg/dL — ABNORMAL HIGH (ref 70–99)
Glucose-Capillary: 119 mg/dL — ABNORMAL HIGH (ref 70–99)
Glucose-Capillary: 124 mg/dL — ABNORMAL HIGH (ref 70–99)
Glucose-Capillary: 97 mg/dL (ref 70–99)
Glucose-Capillary: 171 mg/dL — ABNORMAL HIGH (ref 70–99)

## 2021-12-19 LAB — CBC WITH DIFFERENTIAL/PLATELET
Abs Immature Granulocytes: 0.02 10*3/uL (ref 0.00–0.07)
Basophils Absolute: 0 10*3/uL (ref 0.0–0.1)
Basophils Relative: 1 %
Eosinophils Absolute: 0.1 10*3/uL (ref 0.0–0.5)
Eosinophils Relative: 2 %
HCT: 29.7 % — ABNORMAL LOW (ref 39.0–52.0)
Hemoglobin: 9.9 g/dL — ABNORMAL LOW (ref 13.0–17.0)
Immature Granulocytes: 0 %
Lymphocytes Relative: 29 %
Lymphs Abs: 1.7 10*3/uL (ref 0.7–4.0)
MCH: 28.9 pg (ref 26.0–34.0)
MCHC: 33.3 g/dL (ref 30.0–36.0)
MCV: 86.6 fL (ref 80.0–100.0)
Monocytes Absolute: 0.5 10*3/uL (ref 0.1–1.0)
Monocytes Relative: 9 %
Neutro Abs: 3.5 10*3/uL (ref 1.7–7.7)
Neutrophils Relative %: 59 %
Platelets: 138 10*3/uL — ABNORMAL LOW (ref 150–400)
RBC: 3.43 MIL/uL — ABNORMAL LOW (ref 4.22–5.81)
RDW: 14 % (ref 11.5–15.5)
WBC: 5.8 10*3/uL (ref 4.0–10.5)
nRBC: 0 % (ref 0.0–0.2)

## 2021-12-19 LAB — BASIC METABOLIC PANEL
Anion gap: 8 (ref 5–15)
BUN: 26 mg/dL — ABNORMAL HIGH (ref 6–20)
CO2: 21 mmol/L — ABNORMAL LOW (ref 22–32)
Calcium: 8.3 mg/dL — ABNORMAL LOW (ref 8.9–10.3)
Chloride: 110 mmol/L (ref 98–111)
Creatinine, Ser: 3.11 mg/dL — ABNORMAL HIGH (ref 0.61–1.24)
GFR, Estimated: 23 mL/min — ABNORMAL LOW (ref >60)
Glucose, Bld: 108 mg/dL — ABNORMAL HIGH (ref 70–99)
Potassium: 4 mmol/L (ref 3.5–5.1)
Sodium: 139 mmol/L (ref 135–145)

## 2021-12-19 LAB — MICROALBUMIN / CREATININE URINE RATIO
Creatinine, Urine: 37.3 mg/dL
Microalb Creat Ratio: 8389 mg/g creat — ABNORMAL HIGH (ref 0–29)
Microalb, Ur: 3129.1 ug/mL — ABNORMAL HIGH

## 2021-12-19 LAB — MAGNESIUM: Magnesium: 1.5 mg/dL — ABNORMAL LOW (ref 1.7–2.4)

## 2021-12-19 MED ORDER — MAGNESIUM SULFATE 2 GM/50ML IV SOLN
2.0000 g | Freq: Once | INTRAVENOUS | Status: AC
  Administered 2021-12-19: 2 g via INTRAVENOUS
  Filled 2021-12-19: qty 50

## 2021-12-19 MED ORDER — HYDRALAZINE HCL 50 MG PO TABS
50.0000 mg | ORAL_TABLET | Freq: Three times a day (TID) | ORAL | Status: DC
  Administered 2021-12-19 – 2021-12-20 (×3): 50 mg via ORAL
  Filled 2021-12-19 (×3): qty 1

## 2021-12-19 MED ORDER — PROCHLORPERAZINE MALEATE 5 MG PO TABS
5.0000 mg | ORAL_TABLET | Freq: Three times a day (TID) | ORAL | Status: DC | PRN
  Administered 2021-12-19: 5 mg via ORAL
  Filled 2021-12-19 (×2): qty 1

## 2021-12-19 MED ORDER — FUROSEMIDE 10 MG/ML IJ SOLN
40.0000 mg | Freq: Once | INTRAMUSCULAR | Status: AC
  Administered 2021-12-19: 40 mg via INTRAVENOUS
  Filled 2021-12-19: qty 4

## 2021-12-19 NOTE — Progress Notes (Signed)
Benefits check submitted for empagliflozin- test billing results returned that it is not on formulary and will require a prior authorization- MD notified.

## 2021-12-19 NOTE — TOC Benefit Eligibility Note (Signed)
Patient Research scientist (life sciences) completed.     The patient is currently admitted and upon discharge could be taking FARXIGA.   The current 30 day co-pay is, $15.   The patient is insured through [facility].

## 2021-12-19 NOTE — Progress Notes (Signed)
Heart Failure Navigator Progress Note  Following this hospitalization to assess for HV TOC readiness.   EF 60-65% Creat 3.11  Earnestine Leys, BSN, RN Heart Failure Leisure centre manager Chat Only

## 2021-12-19 NOTE — Progress Notes (Addendum)
HD#1 Subjective:   Summary: This is a 56 year old male with a past medical history of hypertension, uncontrolled type 2 diabetes, neuropathy, Charcot foot deformity, constipation who presented with concerns of shortness of breath, and fluid retention admitted for severe symptomatic hypertension.  Overnight Events: No overnight events   Patient is resting comfortably in bed upon my exam.  Patient is doing better.  He reports that his shortness of breath has significantly improved.  He also reports that his lower leg edema has significantly improved.  He denies any chest pain.  He states that he was walking around yesterday, and was not short of breath.  He reports that he is still nauseous, but has not requested any nausea medicine. He states that he is voiding a lot and having normal bowel movements.  He states no other concerns at this time.  Objective:  Vital signs in last 24 hours: Vitals:   12/19/21 0603 12/19/21 0607 12/19/21 0807 12/19/21 1242  BP: (!) 183/97  (!) 141/80 (!) 174/94  Pulse: 90  81 87  Resp: '20  14 14  ' Temp:   98 F (36.7 C)   TempSrc:   Oral   SpO2:   99% 99%  Weight:  94 kg    Height:       Supplemental O2: Room Air SpO2: 99 %   Physical Exam:  Constitutional: Well-appearing and resting comfortably in bed in no acute distress. HENT: normocephalic atraumatic Neck: JVD noted up to mid neck on right side, improved from yesterday Cardiovascular: regular rate and rhythm, 2/6 systolic murmur heard best at LLSB, possible S4, 2+ pedal pulses to the bilateral DP pulses Pulmonary/Chest: normal work of breathing on room air, mild crackles noted to bilateral lower lung bases Skin: warm and dry Extremities: Trace edema noted to bilateral lower extremities  Filed Weights   12/17/21 1627 12/18/21 1502 12/19/21 0607  Weight: 103.4 kg 97.4 kg 94 kg     Intake/Output Summary (Last 24 hours) at 12/19/2021 1158 Last data filed at 12/19/2021 0853 Gross per 24 hour   Intake 1244.68 ml  Output 1525 ml  Net -280.32 ml   Net IO Since Admission: -3,280.32 mL [12/19/21 1158]  Pertinent Labs:    Latest Ref Rng & Units 12/19/2021    3:44 AM 12/18/2021    2:32 AM 12/17/2021    4:50 PM  CBC  WBC 4.0 - 10.5 K/uL 5.8  6.2  7.3   Hemoglobin 13.0 - 17.0 g/dL 9.9  9.8  11.2   Hematocrit 39.0 - 52.0 % 29.7  29.8  35.3   Platelets 150 - 400 K/uL 138  133  176        Latest Ref Rng & Units 12/19/2021    3:44 AM 12/18/2021    2:32 AM 12/17/2021    4:50 PM  CMP  Glucose 70 - 99 mg/dL 108  134  115   BUN 6 - 20 mg/dL '26  30  29   ' Creatinine 0.61 - 1.24 mg/dL 3.11  3.14  3.24   Sodium 135 - 145 mmol/L 139  139  138   Potassium 3.5 - 5.1 mmol/L 4.0  4.0  4.2   Chloride 98 - 111 mmol/L 110  110  109   CO2 22 - 32 mmol/L '21  19  22   ' Calcium 8.9 - 10.3 mg/dL 8.3  8.5  8.7   Total Protein 6.5 - 8.1 g/dL   6.9   Total Bilirubin 0.3 - 1.2  mg/dL   0.7   Alkaline Phos 38 - 126 U/L   61   AST 15 - 41 U/L   21   ALT 0 - 44 U/L   11     Imaging: ECHOCARDIOGRAM COMPLETE  Result Date: 12/18/2021    ECHOCARDIOGRAM REPORT   Patient Name:   [name] Date of Exam: 12/18/2021 Medical Rec #:  [id number]       Height:       70.0 in Accession #:    [id number]      Weight:       228.0 lb Date of Birth:  07/11/1965        BSA:          2.207 m Patient Age:    70 years        BP:           166/87 mmHg Patient Gender: M               HR:           88 bpm. Exam Location:  Inpatient Procedure: 2D Echo, Cardiac Doppler and Color Doppler Indications:    CHF  History:        Patient has no prior history of Echocardiogram examinations.                 Signs/Symptoms:Shortness of Breath; Risk Factors:Diabetes,                 Dyslipidemia and Hypertension.  Sonographer:    Memory Argue Referring Phys: [id number] [place]  1. Left ventricular ejection fraction, by estimation, is 60 to 65%. The left ventricle has normal function. The left ventricle has no regional wall  motion abnormalities. There is mild left ventricular hypertrophy. Left ventricular diastolic parameters are consistent with Grade I diastolic dysfunction (impaired relaxation). Elevated left ventricular end-diastolic pressure. The E/e' is 24.  2. Right ventricular systolic function is low normal. The right ventricular size is normal. Tricuspid regurgitation signal is inadequate for assessing PA pressure.  3. Left atrial size was mildly dilated.  4. The mitral valve is normal in structure. No evidence of mitral valve regurgitation.  5. The aortic valve is tricuspid. Aortic valve regurgitation is not visualized.  6. The inferior vena cava is normal in size with greater than 50% respiratory variability, suggesting right atrial pressure of 3 mmHg. Comparison(s): No prior Echocardiogram. FINDINGS  Left Ventricle: Left ventricular ejection fraction, by estimation, is 60 to 65%. The left ventricle has normal function. The left ventricle has no regional wall motion abnormalities. The left ventricular internal cavity size was normal in size. There is  mild left ventricular hypertrophy. Left ventricular diastolic parameters are consistent with Grade I diastolic dysfunction (impaired relaxation). Elevated left ventricular end-diastolic pressure. The E/e' is 16. Right Ventricle: The right ventricular size is normal. No increase in right ventricular wall thickness. Right ventricular systolic function is low normal. Tricuspid regurgitation signal is inadequate for assessing PA pressure. Left Atrium: Left atrial size was mildly dilated. Right Atrium: Right atrial size was normal in size. Pericardium: Trivial pericardial effusion is present. The pericardial effusion is posterior to the left ventricle. Mitral Valve: The mitral valve is normal in structure. No evidence of mitral valve regurgitation. Tricuspid Valve: The tricuspid valve is grossly normal. Tricuspid valve regurgitation is trivial. Aortic Valve: The aortic valve is  tricuspid. Aortic valve regurgitation is not visualized. Aortic valve mean gradient measures 4.0 mmHg. Aortic valve peak gradient measures  6.6 mmHg. Aortic valve area, by VTI measures 2.85 cm. Pulmonic Valve: The pulmonic valve was normal in structure. Pulmonic valve regurgitation is not visualized. Aorta: The aortic root and ascending aorta are structurally normal, with no evidence of dilitation. Venous: The inferior vena cava is normal in size with greater than 50% respiratory variability, suggesting right atrial pressure of 3 mmHg. IAS/Shunts: No atrial level shunt detected by color flow Doppler.  LEFT VENTRICLE PLAX 2D LVIDd:         5.20 cm   Diastology LVIDs:         3.40 cm   LV e' medial:    5.77 cm/s LV PW:         1.30 cm   LV E/e' medial:  16.4 LV IVS:        1.10 cm   LV e' lateral:   6.20 cm/s LVOT diam:     2.10 cm   LV E/e' lateral: 15.3 LV SV:         78 LV SV Index:   35 LVOT Area:     3.46 cm  RIGHT VENTRICLE RV S prime:     10.40 cm/s TAPSE (M-mode): 2.0 cm LEFT ATRIUM             Index        RIGHT ATRIUM           Index LA diam:        3.80 cm 1.72 cm/m   RA Area:     11.20 cm LA Vol (A2C):   84.9 ml 38.47 ml/m  RA Volume:   22.40 ml  10.15 ml/m LA Vol (A4C):   74.7 ml 33.85 ml/m LA Biplane Vol: 79.2 ml 35.89 ml/m  AORTIC VALVE AV Area (Vmax):    2.70 cm AV Area (Vmean):   2.64 cm AV Area (VTI):     2.85 cm AV Vmax:           128.00 cm/s AV Vmean:          93.800 cm/s AV VTI:            0.273 m AV Peak Grad:      6.6 mmHg AV Mean Grad:      4.0 mmHg LVOT Vmax:         99.80 cm/s LVOT Vmean:        71.400 cm/s LVOT VTI:          0.225 m LVOT/AV VTI ratio: 0.82  AORTA Ao Root diam: 3.40 cm MITRAL VALVE MV Area (PHT): 4.33 cm    SHUNTS MV Decel Time: 175 msec    Systemic VTI:  0.22 m MV E velocity: 94.90 cm/s  Systemic Diam: 2.10 cm MV A velocity: 96.70 cm/s MV E/A ratio:  0.98 Lyman Bishop MD Electronically signed by Lyman Bishop MD Signature Date/Time: 12/18/2021/4:28:18 PM    Final      Assessment/Plan:   Principal Problem:   Hypertensive emergency Active Problems:   Uncontrolled type 2 diabetes mellitus with hyperglycemia (HCC)   Essential hypertension   Diabetic neuropathy (HCC)   Charcot foot due to diabetes mellitus ([place])   Lower extremity edema   Patient Summary: [name] is a 56 y.o. with a pertinent PMH of hypertension, uncontrolled type 2 diabetes, neuropathy, Charcot foot deformity, constipation presenting with concerns of fluid retention and shortness of breath admitted for severe symptomatic hypertension.   #Heart failure with preserved ejection fraction Echo on 12/18/2021 showing ejection fraction  of 60 to 65%.  Volume overload likely secondary to severe symptomatic hypertension and HFpEF.  Patient is being diuresed and is status post IV Lasix 40 mg x2 yesterday.  Net -3L during admission.  Leg swelling seems to have improved and dyspnea episodes have improved. -Consider adding empagliflozin 10 mg daily, eGFR >20 -Continue to monitor O2 saturation -Continue to diurese with IV Lasix 40 mg -Strict I's and O's -Daily weights -Supplemental oxygen as needed to keep saturations greater 92%  #Primary Hypertension #Elevated troponins, resolved Overnight, blood pressure remained in the 170s to 180s.  Patient is denying any shortness of breath, chest pain, headaches, or vision changes.  Seems that severe symptomatic hypotension has resolved, and goal is to treat hypertension now.  Blood pressure is decreasing, but still not at goal. -Increase carvedilol to 25 mg bid -Hydralazine tid -Continue IV Lasix 40 mg -Monitor blood pressure  #AKI on suspected CKD, unclear baseline stage This could be related to severe symptomatic hypertension.  This could also be chronic kidney disease from diabetes mellitus.  Creatinine 3.11 today from 3.14 yesterday.  Patient is being diuresed. -Continue to monitor BMP -Continue to monitor output  #Chronic nausea and  vomiting Patient has chronic nausea and vomiting.  He has been worked up outpatient with gastric emptying study, colonoscopy, EGD, and CT scan.  Patient is still endorsing nausea today, and has not been eating much.  On MAR review, patient is not taking Zofran, this could be due to the fact that he does not know he can ask for it.  EKG showing mild QT prolongation, will switch to promethazine -Continue promethazine as needed for nausea -Continue to monitor for nausea and vomiting  #Non-insulin dependent type 2 diabetes mellitus #Diabetic neuropathy #Diabetic nephropathy Patient's sugars have been measuring in between 108-125 with outlier of 175 yesterday.  Patient is only on sliding scale insulin.  As patient started to advance his diet, continue to monitor for elevated blood sugars.   -Consider adding empagliflozin 10 mg daily -Continue to monitor blood sugars in hospital -Continue sliding scale insulin -Microalbumin/creatinine ratio pending  Diet: Carb-Modified IVF: None VTE: Enoxaparin Code: Full   Dispo: Anticipated discharge to Home in 1 days pending Clinical improvement.   [place] [facility] Resident PGY-1 [phone] Please contact the on call pager after 5 pm and on weekends at [phone].

## 2021-12-20 LAB — BASIC METABOLIC PANEL
Anion gap: 8 (ref 5–15)
BUN: 29 mg/dL — ABNORMAL HIGH (ref 6–20)
CO2: 21 mmol/L — ABNORMAL LOW (ref 22–32)
Calcium: 8.1 mg/dL — ABNORMAL LOW (ref 8.9–10.3)
Chloride: 108 mmol/L (ref 98–111)
Creatinine, Ser: 3.29 mg/dL — ABNORMAL HIGH (ref 0.61–1.24)
GFR, Estimated: 21 mL/min — ABNORMAL LOW (ref >60)
Glucose, Bld: 99 mg/dL (ref 70–99)
Potassium: 3.8 mmol/L (ref 3.5–5.1)
Sodium: 137 mmol/L (ref 135–145)

## 2021-12-20 LAB — CBC WITH DIFFERENTIAL/PLATELET
Abs Immature Granulocytes: 0.01 10*3/uL (ref 0.00–0.07)
Basophils Absolute: 0 10*3/uL (ref 0.0–0.1)
Basophils Relative: 1 %
Eosinophils Absolute: 0 10*3/uL (ref 0.0–0.5)
Eosinophils Relative: 1 %
HCT: 28.3 % — ABNORMAL LOW (ref 39.0–52.0)
Hemoglobin: 9.2 g/dL — ABNORMAL LOW (ref 13.0–17.0)
Immature Granulocytes: 0 %
Lymphocytes Relative: 26 %
Lymphs Abs: 1.4 10*3/uL (ref 0.7–4.0)
MCH: 28.4 pg (ref 26.0–34.0)
MCHC: 32.5 g/dL (ref 30.0–36.0)
MCV: 87.3 fL (ref 80.0–100.0)
Monocytes Absolute: 0.5 10*3/uL (ref 0.1–1.0)
Monocytes Relative: 10 %
Neutro Abs: 3.2 10*3/uL (ref 1.7–7.7)
Neutrophils Relative %: 62 %
Platelets: 126 10*3/uL — ABNORMAL LOW (ref 150–400)
RBC: 3.24 MIL/uL — ABNORMAL LOW (ref 4.22–5.81)
RDW: 14.3 % (ref 11.5–15.5)
WBC: 5.2 10*3/uL (ref 4.0–10.5)
nRBC: 0 % (ref 0.0–0.2)

## 2021-12-20 LAB — GLUCOSE, CAPILLARY
Glucose-Capillary: 108 mg/dL — ABNORMAL HIGH (ref 70–99)
Glucose-Capillary: 191 mg/dL — ABNORMAL HIGH (ref 70–99)

## 2021-12-20 MED ORDER — PROCHLORPERAZINE MALEATE 5 MG PO TABS
5.0000 mg | ORAL_TABLET | Freq: Three times a day (TID) | ORAL | 0 refills | Status: DC | PRN
  Filled 2021-12-20: qty 30, 10d supply, fill #0

## 2021-12-20 MED ORDER — FUROSEMIDE 40 MG PO TABS: 40.0000 mg | ORAL_TABLET | Freq: Once | ORAL | Status: DC

## 2021-12-20 MED ORDER — CARVEDILOL 25 MG PO TABS
25.0000 mg | ORAL_TABLET | Freq: Two times a day (BID) | ORAL | 5 refills | Status: DC
  Filled 2021-12-20: qty 60, 30d supply, fill #0
  Filled 2022-01-19: qty 60, 30d supply, fill #1

## 2021-12-20 MED ORDER — FUROSEMIDE 40 MG PO TABS: 40.0000 mg | ORAL_TABLET | Freq: Every day | ORAL | 2 refills | Status: DC

## 2021-12-20 MED ORDER — HYDRALAZINE HCL 50 MG PO TABS
50.0000 mg | ORAL_TABLET | Freq: Three times a day (TID) | ORAL | 5 refills | Status: DC
  Filled 2021-12-20: qty 90, 30d supply, fill #0
  Filled 2022-01-19: qty 90, 30d supply, fill #1

## 2021-12-20 NOTE — Discharge Instructions (Signed)
[name],  It was a pleasure taking care of you here at [facility] were admitted for severe hypertension, and during hospitalization you got better.  Please follow these instructions outpatient.  1) Regarding your high blood pressure, we have changed your medication regimen.  Please take Coreg 25 mg 2 times a day.  Please take hydralazine 50 mg 3 times a day.  The current medications you have at home are Coreg 12.5 mg tablets (please take 2 of these 2 times a day to equal the 25 mg 3 times a day).  The other current medication you have at home as hydralazine 25 mg (please take 2 of these 3 times a day to equal the 50 mg 3 times a day).  On Monday 12/22/2021, you can pick up your new dosage medications at the [facility] outpatient pharmacy.  2) Regarding your new heart failure, we have sent you home on Lasix 40 mg daily.  Please pick this up from the [facility] as we want you to start taking this tomorrow.  We will send you home on a dose of Lasix today. The [facility] address address is: [address], [place], [place] [address].  3) Regarding your follow-up appointment, the internal medicine center will call you to schedule an appointment in 1 week.  Thank you, Dr. Leigh Aurora, DO

## 2021-12-20 NOTE — Discharge Summary (Signed)
Name: [name] MRN: [id number] DOB: February 03, 1966 56 y.o. PCP: Pcp, No  Date of Admission: 12/17/2021  4:05 PM Date of Discharge: 12/20/2021 Attending Physician: Dr. Lalla Brothers  Discharge Diagnosis: Principal Problem:   Hypertensive emergency Active Problems:   Uncontrolled type 2 diabetes mellitus with hyperglycemia ([place])   Essential hypertension   Diabetic neuropathy ([place])   Charcot foot due to diabetes mellitus ([place])   Lower extremity edema    Discharge Medications: Allergies as of 12/20/2021       Reactions   Amlodipine Other (See Comments)   Bilateral leg swelling   Lisinopril Swelling, Other (See Comments)   Face and legs swell        Medication List     STOP taking these medications    irbesartan-hydrochlorothiazide 150-12.5 MG tablet Commonly known as: AVALIDE   Victoza 18 MG/3ML Sopn Generic drug: liraglutide       TAKE these medications    carvedilol 25 MG tablet Commonly known as: COREG Take 1 tablet (25 mg total) by mouth 2 (two) times daily with a meal. What changed:  medication strength how much to take   feeding supplement Liqd Take 1 Container by mouth 3 (three) times daily between meals.   furosemide 40 MG tablet Commonly known as: LASIX Take 1 tablet (40 mg total) by mouth daily for 90 doses.   hydrALAZINE 50 MG tablet Commonly known as: APRESOLINE Take 1 tablet (50 mg total) by mouth 3 (three) times daily. What changed:  medication strength how much to take   INSULIN SYRINGE .3CC/31GX5/16" 31G X 5/16" 0.3 ML Misc The patient is insulin requiring, ICD 10 code E11.9. The uses insulin 2 times per day.   OneTouch Delica Plus [id number] Misc use 1 lancet to check blood sugar 3 times daily   OneTouch Verio Flex System w/Device Kit Please check your blood sugar three times a day.   OneTouch Verio test strip Generic drug: glucose blood use 1 strip to check blood sugar 3 times daily   prochlorperazine 5 MG  tablet Commonly known as: COMPAZINE Take 1 tablet (5 mg total) by mouth every 8 (eight) hours as needed for nausea or vomiting.   rosuvastatin 20 MG tablet Commonly known as: CRESTOR Take 1 tablet (20 mg total) by mouth daily.   Unifine Pentips 32G X 4 MM Misc Generic drug: Insulin Pen Needle Use as directed with Victoza daily        Disposition and follow-up:   [name] was discharged from [facility] in Good condition.  At the hospital follow up visit please address:  1.  Follow-up:  a.  Heart failure with preserved ejection fraction: Patient was diuresed in the hospital.  Patient became euvolemic.  Patient to be discharged with Lasix 40 mg daily.  Please ensure to make sure patient's respiratory and volume status stays stable outpatient.  Consider SGLT2 outpatient if kidney function allows.    b.  Primary hypertension: Patient came in with systolic blood pressures elevated into the 230s.  Blood pressure was controlled with Coreg 25 mg twice daily and hydralazine 50 mg 3 times daily.  Patient to continue regimen outpatient.  Monitor blood pressure outpatient and consider adding to regimen as needed.   c.  AKI on suspected CKD: Unsure of baseline for patient, patient remained around 3.1-3.3 during admission.  Continue to monitor outpatient   d. Type 2 diabetes Mellitus: Please continue to monitor sugars outpatient.  Patient has very high microalbumin  to creatinine ratio.  Monitor outpatient.  2.  Labs / imaging needed at time of follow-up: BMP   3.  Pending labs/ test needing follow-up:   4.  Medication Changes  1) Increase Coreg to 25 mg twice daily  2) Increase hydralazine to 50 mg 3 times daily  Follow-up Appointments:  Follow-up Information     [facility] Follow up.   Why: Please await phone call from [facility] health internal medicine center for follow-up appointment. Contact information: [address] [place] [place] Hospital Course by problem list: [name] is a 56 y.o. with a pertinent PMH of hypertension, uncontrolled type 2 diabetes, neuropathy, Charcot foot deformity, constipation presenting with concerns of fluid retention and shortness of breath admitted for severe symptomatic hypertension.  #Heart failure with preserved ejection fraction Echo on 12/18/2021 showing ejection fraction of 60 to 65%.  Volume overload likely secondary to severe symptomatic hypertension and HFpEF. Patient diuresed during hospitalization and recovered well.  Patient to be discharged on Lasix 40 mg daily.  #Primary Hypertension #Elevated troponins, resolved Blood pressures initially were elevated in the 240s, decreasing kidney function and elevated tropes and severe symptomatic hypertension diagnosis was made.  Patient initially was started on Cardene drip and weaned off to carvedilol and hydralazine which are his home medications.  Symptoms resolved.  Blood pressure decreased to the 258N systolic.  Patient to be discharged on Coreg 25 mg twice daily and hydralazine 50 mg 3 times daily.  #AKI on suspected CKD, unclear baseline stage Patient did have elevated creatinine during hospitalization.  Unclear if this is his new baseline versus an AKI.  It was monitored during hospitalization, and the creatinine stayed near 3.1-3.3.   #Chronic nausea and vomiting Patient has chronic nausea and vomiting.  No vomiting during hospitalization.  He has been worked up outpatient with gastric emptying study, colonoscopy, EGD, and CT scan.  Patient was provided antiemetics on a as needed basis during hospitalization.   #Non-insulin dependent type 2 diabetes mellitus #Diabetic neuropathy #Diabetic nephropathy Patient's sugars have been measuring in between 97-171 during hospitalization.  He did not require much insulin during his hospitalization.  It was noted that he has  significant albuminuria.  Patient to be discharged on no medications.    Discharge Subjective: Patient is resting comfortably in bed upon my exam.  He denies any shortness of breath.  He denies any lower extremity edema.  He reports that he is feeling well.  He does note that he had a floater in his eyes earlier in the day when his blood pressure was up in the 180s, but is not concerned about that now. Patient reports that he is doing well today and denies any chest pain or headaches.   Discharge Exam:   BP (!) 147/74 (BP Location: Left Arm)   Pulse 73   Temp 98.7 F (37.1 C) (Oral)   Resp 17   Ht 5' 10.5" (1.791 m)   Wt 92.2 kg   SpO2 100%   BMI 28.76 kg/m  Constitutional: Well-appearing and resting comfortably in bed in no acute distress. HENT: normocephalic atraumatic Cardiovascular: regular rate and rhythm, 2/6 systolic murmur heard best at LLSB, possible S4, 2+ pedal pulses to the bilateral DP pulses Pulmonary/Chest: Clear to ausculation bilaterally  Skin: warm and dry Extremities: No edema noted to bilateral lower extremities   Pertinent Labs,  Studies, and Procedures:     Latest Ref Rng & Units 12/20/2021    2:17 AM 12/19/2021    3:44 AM 12/18/2021    2:32 AM  CBC  WBC 4.0 - 10.5 K/uL 5.2  5.8  6.2   Hemoglobin 13.0 - 17.0 g/dL 9.2  9.9  9.8   Hematocrit 39.0 - 52.0 % 28.3  29.7  29.8   Platelets 150 - 400 K/uL 126  138  133        Latest Ref Rng & Units 12/20/2021    2:17 AM 12/19/2021    3:44 AM 12/18/2021    2:32 AM  CMP  Glucose 70 - 99 mg/dL 99  108  134   BUN 6 - 20 mg/dL '29  26  30   ' Creatinine 0.61 - 1.24 mg/dL 3.29  3.11  3.14   Sodium 135 - 145 mmol/L 137  139  139   Potassium 3.5 - 5.1 mmol/L 3.8  4.0  4.0   Chloride 98 - 111 mmol/L 108  110  110   CO2 22 - 32 mmol/L '21  21  19   ' Calcium 8.9 - 10.3 mg/dL 8.1  8.3  8.5     ECHOCARDIOGRAM COMPLETE  Result Date: 12/18/2021    ECHOCARDIOGRAM REPORT   Patient Name:   [name] Govoni Date of Exam: 12/18/2021  Medical Rec #:  [id number]       Height:       70.0 in Accession #:    [id number]      Weight:       228.0 lb Date of Birth:  [birth year]        BSA:          2.207 m Patient Age:    61 years        BP:           166/87 mmHg Patient Gender: M               HR:           88 bpm. Exam Location:  Inpatient Procedure: 2D Echo, Cardiac Doppler and Color Doppler Indications:    CHF  History:        Patient has no prior history of Echocardiogram examinations.                 Signs/Symptoms:Shortness of Breath; Risk Factors:Diabetes,                 Dyslipidemia and Hypertension.  Sonographer:    Memory Argue Referring Phys: [id number] [place]  1. Left ventricular ejection fraction, by estimation, is 60 to 65%. The left ventricle has normal function. The left ventricle has no regional wall motion abnormalities. There is mild left ventricular hypertrophy. Left ventricular diastolic parameters are consistent with Grade I diastolic dysfunction (impaired relaxation). Elevated left ventricular end-diastolic pressure. The E/e' is 75.  2. Right ventricular systolic function is low normal. The right ventricular size is normal. Tricuspid regurgitation signal is inadequate for assessing PA pressure.  3. Left atrial size was mildly dilated.  4. The mitral valve is normal in structure. No evidence of mitral valve regurgitation.  5. The aortic valve is tricuspid. Aortic valve regurgitation is not visualized.  6. The inferior vena cava is normal in size with greater than 50% respiratory variability, suggesting right atrial pressure of 3 mmHg. Comparison(s): No prior Echocardiogram. FINDINGS  Left Ventricle: Left ventricular ejection fraction, by estimation, is 60 to 65%. The  left ventricle has normal function. The left ventricle has no regional wall motion abnormalities. The left ventricular internal cavity size was normal in size. There is  mild left ventricular hypertrophy. Left ventricular diastolic parameters are  consistent with Grade I diastolic dysfunction (impaired relaxation). Elevated left ventricular end-diastolic pressure. The E/e' is 16. Right Ventricle: The right ventricular size is normal. No increase in right ventricular wall thickness. Right ventricular systolic function is low normal. Tricuspid regurgitation signal is inadequate for assessing PA pressure. Left Atrium: Left atrial size was mildly dilated. Right Atrium: Right atrial size was normal in size. Pericardium: Trivial pericardial effusion is present. The pericardial effusion is posterior to the left ventricle. Mitral Valve: The mitral valve is normal in structure. No evidence of mitral valve regurgitation. Tricuspid Valve: The tricuspid valve is grossly normal. Tricuspid valve regurgitation is trivial. Aortic Valve: The aortic valve is tricuspid. Aortic valve regurgitation is not visualized. Aortic valve mean gradient measures 4.0 mmHg. Aortic valve peak gradient measures 6.6 mmHg. Aortic valve area, by VTI measures 2.85 cm. Pulmonic Valve: The pulmonic valve was normal in structure. Pulmonic valve regurgitation is not visualized. Aorta: The aortic root and ascending aorta are structurally normal, with no evidence of dilitation. Venous: The inferior vena cava is normal in size with greater than 50% respiratory variability, suggesting right atrial pressure of 3 mmHg. IAS/Shunts: No atrial level shunt detected by color flow Doppler.  LEFT VENTRICLE PLAX 2D LVIDd:         5.20 cm   Diastology LVIDs:         3.40 cm   LV e' medial:    5.77 cm/s LV PW:         1.30 cm   LV E/e' medial:  16.4 LV IVS:        1.10 cm   LV e' lateral:   6.20 cm/s LVOT diam:     2.10 cm   LV E/e' lateral: 15.3 LV SV:         78 LV SV Index:   35 LVOT Area:     3.46 cm  RIGHT VENTRICLE RV S prime:     10.40 cm/s TAPSE (M-mode): 2.0 cm LEFT ATRIUM             Index        RIGHT ATRIUM           Index LA diam:        3.80 cm 1.72 cm/m   RA Area:     11.20 cm LA Vol (A2C):   84.9  ml 38.47 ml/m  RA Volume:   22.40 ml  10.15 ml/m LA Vol (A4C):   74.7 ml 33.85 ml/m LA Biplane Vol: 79.2 ml 35.89 ml/m  AORTIC VALVE AV Area (Vmax):    2.70 cm AV Area (Vmean):   2.64 cm AV Area (VTI):     2.85 cm AV Vmax:           128.00 cm/s AV Vmean:          93.800 cm/s AV VTI:            0.273 m AV Peak Grad:      6.6 mmHg AV Mean Grad:      4.0 mmHg LVOT Vmax:         99.80 cm/s LVOT Vmean:        71.400 cm/s LVOT VTI:          0.225 m LVOT/AV VTI ratio: 0.82  AORTA Ao Root diam: 3.40 cm MITRAL VALVE MV Area (PHT): 4.33 cm    SHUNTS MV Decel Time: 175 msec    Systemic VTI:  0.22 m MV E velocity: 94.90 cm/s  Systemic Diam: 2.10 cm MV A velocity: 96.70 cm/s MV E/A ratio:  0.98 Lyman Bishop MD Electronically signed by Lyman Bishop MD Signature Date/Time: 12/18/2021/4:28:18 PM    Final    US RENAL  Result Date: 12/18/2021 CLINICAL DATA:  AK I EXAM: RENAL / URINARY TRACT ULTRASOUND COMPLETE COMPARISON:  CT abdomen and pelvis 01/18/2020 FINDINGS: Right Kidney: Renal measurements: 12.9 x 5.2 x 5.7 cm = volume: 202 mL. Echogenicity within normal limits. No mass or hydronephrosis visualized. Left Kidney: Renal measurements: 12.1 x 5.9 x 5.2 cm = volume: 194 mL. Echogenicity within normal limits. No mass or hydronephrosis visualized. Bladder: Appears normal for degree of bladder distention. Other: None. IMPRESSION: Unremarkable renal ultrasound. Electronically Signed   By: Placido Sou M.D.   On: 12/18/2021 01:36   DG Chest 2 View  Result Date: 12/17/2021 CLINICAL DATA:  Hypertension. EXAM: CHEST - 2 VIEW COMPARISON:  Chest radiograph dated 10/21/2010. FINDINGS: Shallow inspiration with minimal bibasilar atelectasis. Blunting of the costophrenic angles may represent scarring versus trace pleural effusion. No focal consolidation, or pneumothorax. The cardiac silhouette is within limits. No acute osseous pathology. IMPRESSION: Shallow inspiration with minimal bibasilar atelectasis. Electronically  Signed   By: Anner Crete M.D.   On: 12/17/2021 17:34     Discharge Instructions: [name],  It was a pleasure taking care of you here at [facility] were admitted for severe hypertension, and during hospitalization you got better.  Please follow these instructions outpatient.  1) Regarding your high blood pressure, we have changed your medication regimen.  Please take Coreg 25 mg 2 times a day.  Please take hydralazine 50 mg 3 times a day.  The current medications you have at home are Coreg 12.5 mg tablets (please take 2 of these 2 times a day to equal the 25 mg 3 times a day).  The other current medication you have at home as hydralazine 25 mg (please take 2 of these 3 times a day to equal the 50 mg 3 times a day).  On Monday 12/22/2021, you can pick up your new dosage medications at the [facility] outpatient pharmacy.  2) Regarding your new heart failure, we have sent you home on Lasix 40 mg daily.  Please pick this up from the [facility] as we want you to start taking this tomorrow.  We will send you home on a dose of Lasix today. The [facility] address address is: [address], [place], [place] [address].  3) Regarding your follow-up appointment, the internal medicine center will call you to schedule an appointment in 1 week.  Thank you, Dr. Leigh Aurora, DO   Signed: Leigh Aurora, DO 12/20/2021, 2:06 PM   Pager: [phone]

## 2021-12-20 NOTE — Progress Notes (Signed)
Patient given discharge instructions. PIVs removed. Telemetry box applied, CCMD notified. Patient taken to vehicle in wheelchair by staff.  Daymon Larsen, RN

## 2021-12-22 ENCOUNTER — Other Ambulatory Visit (HOSPITAL_COMMUNITY): Payer: Self-pay

## 2021-12-22 ENCOUNTER — Telehealth: Payer: Self-pay

## 2021-12-22 NOTE — Patient Outreach (Signed)
  Care Coordination [facility] Note Transition Care Management Follow-up Telephone Call Date of discharge and from where: [facility] 12/17/21-12/20/21 How have you been since you were released from the hospital? "I am feeling pretty good, tired.  My blood pressure I took at home today was 114/63". Any questions or concerns? No- Patient has a home BP cuff and will keep track of his blood pressure to bring in to clinic with f/u appointment.  Items Reviewed: Did the pt receive and understand the discharge instructions provided? Yes  Medications obtained and verified? Yes  Other? No  Any new allergies since your discharge? No  Dietary orders reviewed? Yes Do you have support at home? Yes   Home Care and Equipment/Supplies: Were home health services ordered? no If so, what is the name of the agency? N/A  Has the agency set up a time to come to the patient's home? not applicable Were any new equipment or medical supplies ordered?  No What is the name of the medical supply agency? N/A Were you able to get the supplies/equipment? no Do you have any questions related to the use of the equipment or supplies? No  Functional Questionnaire: (I = Independent and D = Dependent) ADLs: I  Bathing/Dressing- I  Meal Prep- I  Eating- I  Maintaining continence- I  Transferring/Ambulation- I  Managing Meds- I  Follow up appointments reviewed:  PCP Hospital f/u appt confirmed? Yes  Scheduled to see Dr. Marlou Sa on 01/05/22 @ 2:45. [facility] f/u appt confirmed? No   Are transportation arrangements needed? No  If their condition worsens, is the pt aware to call PCP or go to the Emergency Dept.? Yes Was the patient provided with contact information for the PCP's office or ED? Yes Was to pt encouraged to call back with questions or concerns? Yes  SDOH assessments and interventions completed:   Yes  Care Coordination Interventions Activated:  Yes   Care Coordination Interventions:  No Care  Coordination interventions needed at this time.   Encounter Outcome:  Pt. Visit Completed

## 2021-12-23 ENCOUNTER — Ambulatory Visit (INDEPENDENT_AMBULATORY_CARE_PROVIDER_SITE_OTHER): Payer: Commercial Managed Care - HMO | Admitting: Student

## 2021-12-23 ENCOUNTER — Telehealth: Payer: Self-pay | Admitting: *Deleted

## 2021-12-23 DIAGNOSIS — I5033 Acute on chronic diastolic (congestive) heart failure: Secondary | ICD-10-CM | POA: Insufficient documentation

## 2021-12-23 DIAGNOSIS — I5031 Acute diastolic (congestive) heart failure: Secondary | ICD-10-CM | POA: Diagnosis not present

## 2021-12-23 DIAGNOSIS — I5030 Unspecified diastolic (congestive) heart failure: Secondary | ICD-10-CM | POA: Insufficient documentation

## 2021-12-23 DIAGNOSIS — Z87891 Personal history of nicotine dependence: Secondary | ICD-10-CM | POA: Diagnosis not present

## 2021-12-23 NOTE — Telephone Encounter (Signed)
Sounds good

## 2021-12-23 NOTE — Progress Notes (Signed)
  [facility] Telephone Encounter Continuity Care Appointment  HPI:  This telephone encounter was created for [name] on 12/23/2021 for the following purpose/cc leg swelling.   Past Medical History:  Past Medical History:  Diagnosis Date   Anxiety    Panic Atacks   Arthritis    Carpal tunnel syndrome    Constipation    Depression    Diabetes mellitus    DMII   Dysrhythmia    hx tachy hr   GERD (gastroesophageal reflux disease)    no problem in past few years.   Hepatitis    Hyperlipidemia    Hypertension    Neuropathy associated with endocrine disorder ([place])    Seizures ([place])    "stress related" no more since taking Klonopin 2 years- See Neurologist  [place]   Shortness of breath dyspnea    with exertion   Sleep apnea    used a cpap when he was 350lb-does not need one now 260lb   Wears contact lenses      ROS:     Assessment / Plan / Recommendations:  Please see A&P under problem oriented charting for assessment of the patient's acute and chronic medical conditions.  As always, pt is advised that if symptoms worsen or new symptoms arise, they should go to an urgent care facility or to to ER for further evaluation.   Consent and Medical Decision Making:  Patient discussed with Dr. Philipp Ovens This is a telephone encounter between [facility] on 12/23/2021 for leg swelling. The visit was conducted with the patient located at home and Sanjuan Dame at [facility]. The patient's identity was confirmed using their DOB and current address. The patient has consented to being evaluated through a telephone encounter and understands the associated risks (an examination cannot be done and the patient may need to come in for an appointment) / benefits (allows the patient to remain at home, decreasing exposure to coronavirus). I personally spent 7 minutes on medical discussion.    Acute on chronic heart failure with  preserved ejection fraction (HFpEF) [facility]) [name] is presenting via telephone today to discuss bilateral leg swelling that worsened today. He was recently admitted to our service for hypertensive emergency and acute heart diastolic heart failure. He was discharged 9/13 on furosemide 40mg  daily, which he has been taking consistently. He reports waking up this morning and feeling very fatigued. Mentions his legs are also swollen, similar to prior to admission last time. He is reporting mild dyspnea, but is able to move around without difficulty. He has not weighed himself since he has been home, but reports last weight in the hospital was ~203lbs.   Discussed with [name] most likely this is acute heart failure and will need increased diuretics. I will have him increased his lasix dosage today and have him return to clinic tomorrow morning. Patient is agreeable to this plan.   - Increase furosemide 80mg  - Return to clinic 8:45AM tomorrow  Sanjuan Dame, MD Internal Medicine PGY-3 Pager: [phone]

## 2021-12-23 NOTE — Assessment & Plan Note (Signed)
[name] is presenting via telephone today to discuss bilateral leg swelling that worsened today. He was recently admitted to our service for hypertensive emergency and acute heart diastolic heart failure. He was discharged 9/13 on furosemide 40mg  daily, which he has been taking consistently. He reports waking up this morning and feeling very fatigued. Mentions his legs are also swollen, similar to prior to admission last time. He is reporting mild dyspnea, but is able to move around without difficulty. He has not weighed himself since he has been home, but reports last weight in the hospital was ~203lbs.   Discussed with [name] most likely this is acute heart failure and will need increased diuretics. I will have him increased his lasix dosage today and have him return to clinic tomorrow morning. Patient is agreeable to this plan.   - Increase furosemide 80mg  - Return to clinic 8:45AM tomorrow

## 2021-12-23 NOTE — Progress Notes (Signed)
Internal Medicine Clinic Attending  Case discussed with Dr. Sridharan  at the time of the visit.  We reviewed the resident's history and exam and pertinent patient test results.  I agree with the assessment, diagnosis, and plan of care documented in the resident's note.  

## 2021-12-23 NOTE — Telephone Encounter (Signed)
Call from patient was recently discharged from the hospital.  Swelling in legs has returned with some shortness of breath when walking distances.  States was put on something like Lasix.  Marland Kitchen  Has been taking medication.  Offered an appointment for this 10:45 this am.  States unable to come in for an appointment.  [facility] appointment for 10:45 am instead.

## 2021-12-24 ENCOUNTER — Other Ambulatory Visit: Payer: Self-pay

## 2021-12-24 ENCOUNTER — Encounter: Payer: Self-pay | Admitting: Student

## 2021-12-24 ENCOUNTER — Ambulatory Visit (INDEPENDENT_AMBULATORY_CARE_PROVIDER_SITE_OTHER): Payer: Commercial Managed Care - HMO | Admitting: Student

## 2021-12-24 VITALS — BP 131/71 | HR 69 | Temp 98.2°F | Ht 70.0 in | Wt 222.9 lb

## 2021-12-24 DIAGNOSIS — N179 Acute kidney failure, unspecified: Secondary | ICD-10-CM

## 2021-12-24 DIAGNOSIS — I5033 Acute on chronic diastolic (congestive) heart failure: Secondary | ICD-10-CM | POA: Diagnosis not present

## 2021-12-24 DIAGNOSIS — I110 Hypertensive heart disease with heart failure: Secondary | ICD-10-CM | POA: Diagnosis not present

## 2021-12-24 DIAGNOSIS — I10 Essential (primary) hypertension: Secondary | ICD-10-CM

## 2021-12-24 DIAGNOSIS — Z87891 Personal history of nicotine dependence: Secondary | ICD-10-CM | POA: Diagnosis not present

## 2021-12-24 NOTE — Progress Notes (Signed)
Internal Medicine Clinic Attending ? ?Case discussed with Dr. Braswell  At the time of the visit.  We reviewed the resident?s history and exam and pertinent patient test results.  I agree with the assessment, diagnosis, and plan of care documented in the resident?s note.  ?

## 2021-12-24 NOTE — Assessment & Plan Note (Signed)
Stimulator is presenting for a telephone call follow-up.  He is complaining of worsening bilateral leg swelling.  He was admitted to our service for hypertensive emergency, which led to an acute heart failure exacerbation.  He was given furosemide milligrams upon discharge.  He states the swelling started 2 days after he left the hospital, and has been compliant with his medications.  He states his dyspnea has returned on exertion, did not want to walk from the parking lot to the clinic, he had stopped for 5 minutes to catch his breath.  He is positive for orthopnea and sleeps with 6 pillows.  He denies any chest pain.  His weight today is 222, but his weight when he left the hospital was around 201.  His last EF was 60 to 65% with no regional wall abnormalities.  Blood pressure is 131/70.  Plan: - Increase his Lasix to 80 mg twice daily.  Patient still has supply of his 40 mg, so no need to write new prescription. - BMP was done today, we will update patient with results - If symptoms are not relieved by next visit which should be on Monday, September 25, consider switching Lasix to torsemide or budesonide.

## 2021-12-24 NOTE — Assessment & Plan Note (Signed)
Continue current medical regimen Coreg 25 mg twice daily, hydralazine 50 mg 3 times daily.  His blood pressure today was 131/71.

## 2021-12-24 NOTE — Patient Instructions (Signed)
Thank you so much for coming into the clinic today! We talked about your weight gain and the fluid that's been building up since your last admission. We recommend you take two tablets of your 40mg  of Lasix TWICE a day, so that would be 80 mg in the morning, and 80 in the afternoon. Please continue to check your weight, and I will give you a call when the results of your bloodwork are back. If you feel shortness of breath, or that you're getting worse, please go to the emergency department.

## 2021-12-24 NOTE — Progress Notes (Signed)
CC: Lower extremity edema  HPI:  [name] a 56 y.o. male living with a history stated below and presents today for worsening lower extremity edema. Please see problem based assessment and plan for additional details.  Past Medical History:  Diagnosis Date   Anxiety    Panic Atacks   Arthritis    Carpal tunnel syndrome    Constipation    Depression    Diabetes mellitus    DMII   Dysrhythmia    hx tachy hr   GERD (gastroesophageal reflux disease)    no problem in past few years.   Hepatitis    Hyperlipidemia    Hypertension    Neuropathy associated with endocrine disorder ([name])    Seizures ([name])    "stress related" no more since taking Klonopin 2 years- See Neurologist  [name]   Shortness of breath dyspnea    with exertion   Sleep apnea    used a cpap when he was 350lb-does not need one now 260lb   Wears contact lenses     Current Outpatient Medications on File Prior to Visit  Medication Sig Dispense Refill   blood glucose meter kit and supplies KIT Please check your blood sugar three times a day. 1 each 0   carvedilol (COREG) 25 MG tablet Take 1 tablet (25 mg total) by mouth 2 (two) times daily with a meal. 60 tablet 5   feeding supplement (BOOST HIGH PROTEIN) LIQD Take 1 Container by mouth 3 (three) times daily between meals.     furosemide (LASIX) 40 MG tablet Take 1 tablet (40 mg total) by mouth daily for 90 doses. 30 tablet 2   glucose blood test strip use 1 strip to check blood sugar 3 times daily 100 each 0   hydrALAZINE (APRESOLINE) 50 MG tablet Take 1 tablet (50 mg total) by mouth 3 (three) times daily. 90 tablet 5   Insulin Pen Needle (PEN NEEDLES) 32G X 4 MM MISC Use as directed with Victoza daily 100 each 0   Insulin Syringe-Needle U-100 (INSULIN SYRINGE .3CC/31GX5/16") 31G X 5/16" 0.3 ML MISC The patient is insulin requiring, ICD 10 code E11.9. The uses insulin 2 times per day. 100 each 3   Lancets (ONETOUCH DELICA PLUS [id number]) MISC use 1  lancet to check blood sugar 3 times daily 100 each 0   prochlorperazine (COMPAZINE) 5 MG tablet Take 1 tablet (5 mg total) by mouth every 8 (eight) hours as needed for nausea or vomiting. 30 tablet 0   rosuvastatin (CRESTOR) 20 MG tablet Take 1 tablet (20 mg total) by mouth daily. 30 tablet 2   No current facility-administered medications on file prior to visit.    Family History  Problem Relation Age of Onset   Cancer Mother        pancres   Diabetes Mother    Hypertension Brother    Renal cancer Brother    Cancer Maternal Uncle    Colon cancer Maternal Uncle    Colon cancer Maternal Uncle    Stomach cancer Maternal Uncle    Esophageal cancer Neg Hx    Rectal cancer Neg Hx     Social History   Socioeconomic History   Marital status: Single    Spouse name: Not on file   Number of children: Not on file   Years of education: Not on file   Highest education level: Not on file  Occupational History   Not on file  Tobacco Use  Smoking status: Former    Types: Cigarettes    Quit date: 10/27/2008    Years since quitting: 13.1   Smokeless tobacco: Never   Tobacco comments:    Quit x 10 yrs.  Vaping Use   Vaping Use: Never used  Substance and Sexual Activity   Alcohol use: Not Currently    Comment: Beer sometimes.   Drug use: No    Types: Marijuana   Sexual activity: Not Currently    Partners: Male    Comment: last HIV test years ago.  Other Topics Concern   Not on file  Social History Narrative   Not on file   Social Determinants of Health   Financial Resource Strain: Not on file  Food Insecurity: Not on file  Transportation Needs: No Transportation Needs (12/22/2021)   PRAPARE - [name] (Medical): No    Lack of Transportation (Non-Medical): No  Physical Activity: Not on file  Stress: Not on file  Social Connections: Not on file  Intimate Partner Violence: Not on file    Review of Systems: ROS negative except for what is  noted on the assessment and plan.  Vitals:   12/24/21 0835  BP: 131/71  Pulse: 69  Temp: 98.2 F (36.8 C)  TempSrc: Oral  SpO2: 100%  Weight: 222 lb 14.4 oz (101.1 kg)  Height: '5\' 10"'  (1.778 m)    Physical Exam: Constitutional: Obese gentleman, in no acute distress Neck: supple, no JVD appreciated Cardiovascular: regular rate and rhythm, no m/r/g Pulmonary/Chest: normal work of breathing on room air, lungs clear to auscultation bilaterally Abdominal: soft, non-tender, non-distended Skin: warm and dry, +3 pitting edema on Bilateral lower extremities   Assessment & Plan:   Acute on chronic heart failure with preserved ejection fraction (HFpEF) (HCC) Stimulator is presenting for a telephone call follow-up.  He is complaining of worsening bilateral leg swelling.  He was admitted to our service for hypertensive emergency, which led to an acute heart failure exacerbation.  He was given furosemide milligrams upon discharge.  He states the swelling started 2 days after he left the hospital, and has been compliant with his medications.  He states his dyspnea has returned on exertion, did not want to walk from the parking lot to the clinic, he had stopped for 5 minutes to catch his breath.  He is positive for orthopnea and sleeps with 6 pillows.  He denies any chest pain.  His weight today is 222, but his weight when he left the hospital was around 201.  His last EF was 60 to 65% with no regional wall abnormalities.  Blood pressure is 131/70.  Plan: - Increase his Lasix to 80 mg twice daily.  Patient still has supply of his 40 mg, so no need to write new prescription. - BMP was done today, we will update patient with results - If symptoms are not relieved by next visit which should be on Monday, September 25, consider switching Lasix to torsemide or budesonide.  Essential hypertension Continue current medical regimen Coreg 25 mg twice daily, hydralazine 50 mg 3 times daily.  His blood  pressure today was 131/71.  AKI (acute kidney injury) [name]) Patient is not able to see a nephrologist because of his uninsured state.  He is working on obtaining the orange card.  Precautions his last BMP was on September 16, creatinine was 3.29. Seems from July of this year his creatinine level is hovering around 2.9-3.29.  Last year creatinine  was 1.1 renal ultrasound was done when he was admitted to the hospital showed no signs of obstruction.  BMP was collected today.  Plan: - Continue waiting for [facility] card clearance, for future referral to nephrology - We will update patient when BMP results  Patient seen with Dr. Thomasene Ripple, M.D. [facility] [facility] [facility], PGY-1 Phone: [phone] Date 12/24/2021 Time 11:59 AM

## 2021-12-24 NOTE — Assessment & Plan Note (Signed)
Patient is not able to see a nephrologist because of his uninsured state.  He is working on obtaining the orange card.  Precautions his last BMP was on September 16, creatinine was 3.29. Seems from July of this year his creatinine level is hovering around 2.9-3.29.  Last year creatinine was 1.1 renal ultrasound was done when he was admitted to the hospital showed no signs of obstruction.  BMP was collected today.  Plan: - Continue waiting for [facility] card clearance, for future referral to nephrology - We will update patient when BMP results

## 2021-12-25 LAB — BMP8+ANION GAP
Anion Gap: 16 mmol/L (ref 10.0–18.0)
BUN/Creatinine Ratio: 12 (ref 9–20)
BUN: 47 mg/dL — ABNORMAL HIGH (ref 6–24)
CO2: 19 mmol/L — ABNORMAL LOW (ref 20–29)
Calcium: 8 mg/dL — ABNORMAL LOW (ref 8.7–10.2)
Chloride: 104 mmol/L (ref 96–106)
Creatinine, Ser: 3.88 mg/dL — ABNORMAL HIGH (ref 0.76–1.27)
Glucose: 172 mg/dL — ABNORMAL HIGH (ref 70–99)
Potassium: 4.8 mmol/L (ref 3.5–5.2)
Sodium: 139 mmol/L (ref 134–144)
eGFR: 18 mL/min/{1.73_m2} — ABNORMAL LOW (ref >59)

## 2021-12-25 NOTE — Progress Notes (Addendum)
Internal Medicine Clinic Attending  I saw and evaluated the patient.  I personally confirmed the key portions of the history and exam documented by Dr Jodell Cipro and I reviewed pertinent patient test results.  The assessment, diagnosis, and plan were formulated together and I agree with the documentation in the resident's note.   Has had rapid weight reaccumulation after discharge, when he increased to 80mg  lasix he had improved UOP, will increase to BID and see him back closely.  If weight continues to increase or becomes further SOB will need to present to ED for readmission.

## 2021-12-30 ENCOUNTER — Ambulatory Visit (INDEPENDENT_AMBULATORY_CARE_PROVIDER_SITE_OTHER): Payer: Commercial Managed Care - HMO | Admitting: Internal Medicine

## 2021-12-30 ENCOUNTER — Encounter: Payer: Self-pay | Admitting: Internal Medicine

## 2021-12-30 ENCOUNTER — Other Ambulatory Visit: Payer: Self-pay

## 2021-12-30 ENCOUNTER — Encounter (HOSPITAL_COMMUNITY): Payer: Self-pay

## 2021-12-30 VITALS — BP 140/67 | HR 72 | Temp 98.3°F | Resp 32 | Ht 70.0 in | Wt 236.5 lb

## 2021-12-30 DIAGNOSIS — I5033 Acute on chronic diastolic (congestive) heart failure: Secondary | ICD-10-CM | POA: Diagnosis not present

## 2021-12-30 DIAGNOSIS — I509 Heart failure, unspecified: Secondary | ICD-10-CM

## 2021-12-30 LAB — BASIC METABOLIC PANEL
Anion gap: 10 (ref 5–15)
BUN: 66 mg/dL — ABNORMAL HIGH (ref 6–20)
CO2: 19 mmol/L — ABNORMAL LOW (ref 22–32)
Calcium: 7.8 mg/dL — ABNORMAL LOW (ref 8.9–10.3)
Chloride: 108 mmol/L (ref 98–111)
Creatinine, Ser: 4.46 mg/dL — ABNORMAL HIGH (ref 0.61–1.24)
GFR, Estimated: 15 mL/min — ABNORMAL LOW (ref >60)
Glucose, Bld: 211 mg/dL — ABNORMAL HIGH (ref 70–99)
Potassium: 4.6 mmol/L (ref 3.5–5.1)
Sodium: 137 mmol/L (ref 135–145)

## 2021-12-30 LAB — BRAIN NATRIURETIC PEPTIDE: B Natriuretic Peptide: 693.2 pg/mL — ABNORMAL HIGH (ref 0.0–100.0)

## 2021-12-30 NOTE — Progress Notes (Signed)
   CC: 1 wk fu  HPI:[name] a 56 y.o. male who presents for evaluation of HFpEF. Please see individual problem based A/P for details.  Patient seen for 1 week follow up for acute on chronic heart failure. He was recently discharged from hospital and noted to have 20 lbs weight gain between hospital discharge and clinic follow up. During that clinic visit, he was instructed to increase lasix to 80BID which he has been taking. Despite taking 80BID, he has continued to gain additional 14lbs, now 35lbs over dry weight. He is having worsening DOE, orthopnea. Clothes are fitting tighter, feels edema up into chest. Not having any chest pain.   Depression, PHQ-9: Based on the patients  [place] Visit from 12/24/2021 in [place]  PHQ-9 Total Score 0      score we have .  Past Medical History:  Diagnosis Date   Anxiety    Panic Atacks   Arthritis    Carpal tunnel syndrome    Constipation    Depression    Diabetes mellitus    DMII   Dysrhythmia    hx tachy hr   GERD (gastroesophageal reflux disease)    no problem in past few years.   Hepatitis    Hyperlipidemia    Hypertension    Neuropathy associated with endocrine disorder ([place])    Seizures ([place])    "stress related" no more since taking Klonopin 2 years- See Neurologist  [facility]   Shortness of breath dyspnea    with exertion   Sleep apnea    used a cpap when he was 350lb-does not need one now 260lb   Wears contact lenses    Review of Systems:   Review of Systems  Respiratory:  Positive for shortness of breath.   Cardiovascular:  Positive for orthopnea, leg swelling and PND.     Physical Exam: There were no vitals filed for this visit.   General: resting comfortably, NAD Cardiovascular: Normal rate, regular rhythm.  No murmurs, rubs, or gallops Pulmonary : trace crackles bibasilar Abdominal: soft, nontender,  bowel sounds present Ext: 2+ pitting edema bilaterally    Assessment & Plan:   See Encounters Tab for problem based charting.  Concern for acute on chronic CHF exacerbation. 14lb wt gain since last office visit 1 week ago. 35 lbs over dry weight. Likely not responding to oral formulation of lasix due to increased bowel wall edema.  Requires IV formulation to facilitate diuresis.     Patient discussed with Dr.  Saverio Danker

## 2021-12-30 NOTE — Patient Instructions (Signed)
Dear [name],  We evaluated you in the clinic for heart failure exacerbation.  Unfortunately, your body is not responding to the oral form of Lasix due to gut swelling. We would like to admit you to the hospital for IV diuresis to help get the extra fluid off.  If your symptoms worsen, please go straight to the ED.

## 2021-12-30 NOTE — Assessment & Plan Note (Addendum)
Patient seen for 1 week follow up for acute on chronic heart failure. He was recently discharged from hospital and noted to have 20 lbs weight gain between hospital discharge and clinic follow up. During that clinic visit, he was instructed to increase lasix to 80BID which he has been taking. Despite taking 80BID, he has continued to gain additional 14lbs, now 35lbs over dry weight. He is having worsening DOE, orthopnea. Clothes are fitting tighter, feels edema up into chest. Not having any chest pain.  On exam, NAD, noted to have 2+ pitting, trace bibasilar crackles. Still satting well on RA. Heart RRR.  Concern for acute on chronic CHF exacerbation. Likely not responding to oral formulation of lasix due to increased bowel wall edema. Requires IV formulation to facilitate diuresis.   - admit to hospital for IV diuresis

## 2021-12-31 ENCOUNTER — Encounter (HOSPITAL_COMMUNITY): Payer: Self-pay

## 2021-12-31 ENCOUNTER — Inpatient Hospital Stay (HOSPITAL_COMMUNITY)
Admission: RE | Admit: 2021-12-31 | Discharge: 2022-01-05 | DRG: 291 | Disposition: A | Discharge status: Home / Self Care | Payer: Commercial Managed Care - HMO | Source: Ambulatory Visit | Attending: Internal Medicine | Admitting: Internal Medicine

## 2021-12-31 ENCOUNTER — Inpatient Hospital Stay (HOSPITAL_COMMUNITY): Payer: Commercial Managed Care - HMO

## 2021-12-31 DIAGNOSIS — E8809 Other disorders of plasma-protein metabolism, not elsewhere classified: Secondary | ICD-10-CM | POA: Diagnosis present

## 2021-12-31 DIAGNOSIS — Z833 Family history of diabetes mellitus: Secondary | ICD-10-CM

## 2021-12-31 DIAGNOSIS — N189 Chronic kidney disease, unspecified: Secondary | ICD-10-CM

## 2021-12-31 DIAGNOSIS — Z8051 Family history of malignant neoplasm of kidney: Secondary | ICD-10-CM | POA: Diagnosis not present

## 2021-12-31 DIAGNOSIS — I130 Hypertensive heart and chronic kidney disease with heart failure and stage 1 through stage 4 chronic kidney disease, or unspecified chronic kidney disease: Principal | ICD-10-CM | POA: Diagnosis present

## 2021-12-31 DIAGNOSIS — Z8249 Family history of ischemic heart disease and other diseases of the circulatory system: Secondary | ICD-10-CM

## 2021-12-31 DIAGNOSIS — D631 Anemia in chronic kidney disease: Secondary | ICD-10-CM | POA: Diagnosis present

## 2021-12-31 DIAGNOSIS — Z5329 Procedure and treatment not carried out because of patient's decision for other reasons: Secondary | ICD-10-CM | POA: Diagnosis present

## 2021-12-31 DIAGNOSIS — E1122 Type 2 diabetes mellitus with diabetic chronic kidney disease: Secondary | ICD-10-CM | POA: Diagnosis present

## 2021-12-31 DIAGNOSIS — Z800 Family history of malignant neoplasm of digestive organs: Secondary | ICD-10-CM | POA: Diagnosis not present

## 2021-12-31 DIAGNOSIS — Z79899 Other long term (current) drug therapy: Secondary | ICD-10-CM

## 2021-12-31 DIAGNOSIS — E1140 Type 2 diabetes mellitus with diabetic neuropathy, unspecified: Secondary | ICD-10-CM | POA: Diagnosis present

## 2021-12-31 DIAGNOSIS — Y92239 Unspecified place in hospital as the place of occurrence of the external cause: Secondary | ICD-10-CM | POA: Diagnosis present

## 2021-12-31 DIAGNOSIS — I5030 Unspecified diastolic (congestive) heart failure: Secondary | ICD-10-CM | POA: Diagnosis present

## 2021-12-31 DIAGNOSIS — F32A Depression, unspecified: Secondary | ICD-10-CM | POA: Diagnosis present

## 2021-12-31 DIAGNOSIS — N186 End stage renal disease: Secondary | ICD-10-CM

## 2021-12-31 DIAGNOSIS — I509 Heart failure, unspecified: Secondary | ICD-10-CM

## 2021-12-31 DIAGNOSIS — N185 Chronic kidney disease, stage 5: Secondary | ICD-10-CM

## 2021-12-31 DIAGNOSIS — G4733 Obstructive sleep apnea (adult) (pediatric): Secondary | ICD-10-CM | POA: Diagnosis present

## 2021-12-31 DIAGNOSIS — T502X5A Adverse effect of carbonic-anhydrase inhibitors, benzothiadiazides and other diuretics, initial encounter: Secondary | ICD-10-CM | POA: Diagnosis present

## 2021-12-31 DIAGNOSIS — F1290 Cannabis use, unspecified, uncomplicated: Secondary | ICD-10-CM | POA: Diagnosis present

## 2021-12-31 DIAGNOSIS — R600 Localized edema: Secondary | ICD-10-CM | POA: Diagnosis present

## 2021-12-31 DIAGNOSIS — I5033 Acute on chronic diastolic (congestive) heart failure: Secondary | ICD-10-CM | POA: Diagnosis present

## 2021-12-31 DIAGNOSIS — D696 Thrombocytopenia, unspecified: Secondary | ICD-10-CM | POA: Diagnosis present

## 2021-12-31 DIAGNOSIS — N184 Chronic kidney disease, stage 4 (severe): Secondary | ICD-10-CM | POA: Diagnosis present

## 2021-12-31 DIAGNOSIS — E1165 Type 2 diabetes mellitus with hyperglycemia: Secondary | ICD-10-CM | POA: Diagnosis present

## 2021-12-31 DIAGNOSIS — Z87891 Personal history of nicotine dependence: Secondary | ICD-10-CM | POA: Diagnosis not present

## 2021-12-31 DIAGNOSIS — Z8619 Personal history of other infectious and parasitic diseases: Secondary | ICD-10-CM

## 2021-12-31 DIAGNOSIS — N179 Acute kidney failure, unspecified: Secondary | ICD-10-CM | POA: Diagnosis present

## 2021-12-31 DIAGNOSIS — R1115 Cyclical vomiting syndrome unrelated to migraine: Secondary | ICD-10-CM | POA: Diagnosis present

## 2021-12-31 DIAGNOSIS — I10 Essential (primary) hypertension: Secondary | ICD-10-CM | POA: Diagnosis present

## 2021-12-31 DIAGNOSIS — E785 Hyperlipidemia, unspecified: Secondary | ICD-10-CM | POA: Diagnosis present

## 2021-12-31 DIAGNOSIS — R791 Abnormal coagulation profile: Secondary | ICD-10-CM | POA: Diagnosis present

## 2021-12-31 DIAGNOSIS — E119 Type 2 diabetes mellitus without complications: Secondary | ICD-10-CM | POA: Diagnosis present

## 2021-12-31 DIAGNOSIS — K761 Chronic passive congestion of liver: Secondary | ICD-10-CM | POA: Diagnosis present

## 2021-12-31 DIAGNOSIS — K219 Gastro-esophageal reflux disease without esophagitis: Secondary | ICD-10-CM | POA: Diagnosis present

## 2021-12-31 DIAGNOSIS — F419 Anxiety disorder, unspecified: Secondary | ICD-10-CM | POA: Diagnosis present

## 2021-12-31 DIAGNOSIS — H538 Other visual disturbances: Secondary | ICD-10-CM | POA: Diagnosis present

## 2021-12-31 HISTORY — DX: Heart failure, unspecified: I50.9

## 2021-12-31 LAB — COMPREHENSIVE METABOLIC PANEL
ALT: 21 U/L (ref 0–44)
AST: 22 U/L (ref 15–41)
Albumin: 2.4 g/dL — ABNORMAL LOW (ref 3.5–5.0)
Alkaline Phosphatase: 52 U/L (ref 38–126)
Anion gap: 7 (ref 5–15)
BUN: 67 mg/dL — ABNORMAL HIGH (ref 6–20)
CO2: 23 mmol/L (ref 22–32)
Calcium: 8.4 mg/dL — ABNORMAL LOW (ref 8.9–10.3)
Chloride: 106 mmol/L (ref 98–111)
Creatinine, Ser: 4.47 mg/dL — ABNORMAL HIGH (ref 0.61–1.24)
GFR, Estimated: 15 mL/min — ABNORMAL LOW (ref >60)
Glucose, Bld: 257 mg/dL — ABNORMAL HIGH (ref 70–99)
Potassium: 4.4 mmol/L (ref 3.5–5.1)
Sodium: 136 mmol/L (ref 135–145)
Total Bilirubin: 0.3 mg/dL (ref 0.3–1.2)
Total Protein: 6.1 g/dL — ABNORMAL LOW (ref 6.5–8.1)

## 2021-12-31 LAB — CBC
HCT: 26.9 % — ABNORMAL LOW (ref 39.0–52.0)
Hemoglobin: 8.8 g/dL — ABNORMAL LOW (ref 13.0–17.0)
MCH: 28.9 pg (ref 26.0–34.0)
MCHC: 32.7 g/dL (ref 30.0–36.0)
MCV: 88.2 fL (ref 80.0–100.0)
Platelets: 104 10*3/uL — ABNORMAL LOW (ref 150–400)
RBC: 3.05 MIL/uL — ABNORMAL LOW (ref 4.22–5.81)
RDW: 14 % (ref 11.5–15.5)
WBC: 5.4 10*3/uL (ref 4.0–10.5)
nRBC: 0 % (ref 0.0–0.2)

## 2021-12-31 LAB — GLUCOSE, CAPILLARY: Glucose-Capillary: 201 mg/dL — ABNORMAL HIGH (ref 70–99)

## 2021-12-31 LAB — MAGNESIUM: Magnesium: 1.9 mg/dL (ref 1.7–2.4)

## 2021-12-31 LAB — BRAIN NATRIURETIC PEPTIDE: B Natriuretic Peptide: 771.5 pg/mL — ABNORMAL HIGH (ref 0.0–100.0)

## 2021-12-31 MED ORDER — INSULIN GLARGINE-YFGN 100 UNIT/ML ~~LOC~~ SOLN
5.0000 [IU] | Freq: Every day | SUBCUTANEOUS | Status: DC
  Administered 2021-12-31 – 2022-01-04 (×5): 5 [IU] via SUBCUTANEOUS
  Filled 2021-12-31 (×6): qty 0.05

## 2021-12-31 MED ORDER — FUROSEMIDE 10 MG/ML IJ SOLN
120.0000 mg | Freq: Once | INTRAVENOUS | Status: AC
  Administered 2021-12-31: 120 mg via INTRAVENOUS
  Filled 2021-12-31: qty 10

## 2021-12-31 MED ORDER — ROSUVASTATIN CALCIUM 20 MG PO TABS
20.0000 mg | ORAL_TABLET | Freq: Every day | ORAL | Status: DC
  Administered 2021-12-31 – 2022-01-01 (×2): 20 mg via ORAL
  Filled 2021-12-31 (×2): qty 1

## 2021-12-31 MED ORDER — INSULIN ASPART 100 UNIT/ML IJ SOLN
0.0000 [IU] | Freq: Three times a day (TID) | INTRAMUSCULAR | Status: DC
  Administered 2021-12-31: 3 [IU] via SUBCUTANEOUS
  Administered 2022-01-01 (×2): 2 [IU] via SUBCUTANEOUS
  Administered 2022-01-02: 3 [IU] via SUBCUTANEOUS
  Administered 2022-01-02 – 2022-01-03 (×2): 1 [IU] via SUBCUTANEOUS
  Administered 2022-01-03: 2 [IU] via SUBCUTANEOUS
  Administered 2022-01-03 – 2022-01-04 (×2): 1 [IU] via SUBCUTANEOUS
  Administered 2022-01-04: 2 [IU] via SUBCUTANEOUS
  Administered 2022-01-04: 1 [IU] via SUBCUTANEOUS
  Administered 2022-01-05: 2 [IU] via SUBCUTANEOUS

## 2021-12-31 MED ORDER — MAGNESIUM SULFATE 2 GM/50ML IV SOLN
2.0000 g | Freq: Once | INTRAVENOUS | Status: AC
  Administered 2021-12-31: 2 g via INTRAVENOUS
  Filled 2021-12-31: qty 50

## 2021-12-31 MED ORDER — RIVAROXABAN 10 MG PO TABS
10.0000 mg | ORAL_TABLET | Freq: Every day | ORAL | Status: DC
  Administered 2021-12-31 – 2022-01-05 (×6): 10 mg via ORAL
  Filled 2021-12-31 (×7): qty 1

## 2021-12-31 MED ORDER — PROCHLORPERAZINE MALEATE 5 MG PO TABS
5.0000 mg | ORAL_TABLET | Freq: Three times a day (TID) | ORAL | Status: DC | PRN
  Administered 2021-12-31 – 2022-01-04 (×9): 5 mg via ORAL
  Filled 2021-12-31 (×12): qty 1

## 2021-12-31 MED ORDER — ACETAMINOPHEN 325 MG PO TABS
650.0000 mg | ORAL_TABLET | Freq: Four times a day (QID) | ORAL | Status: DC | PRN
  Filled 2021-12-31: qty 2

## 2021-12-31 MED ORDER — HYDRALAZINE HCL 50 MG PO TABS
50.0000 mg | ORAL_TABLET | Freq: Three times a day (TID) | ORAL | Status: DC
  Administered 2021-12-31 – 2022-01-05 (×15): 50 mg via ORAL
  Filled 2021-12-31 (×15): qty 1

## 2021-12-31 MED ORDER — CARVEDILOL 25 MG PO TABS
25.0000 mg | ORAL_TABLET | Freq: Two times a day (BID) | ORAL | Status: DC
  Administered 2021-12-31 – 2022-01-05 (×10): 25 mg via ORAL
  Filled 2021-12-31 (×10): qty 1

## 2021-12-31 MED ORDER — ACETAMINOPHEN 650 MG RE SUPP: 650.0000 mg | Freq: Four times a day (QID) | RECTAL | Status: DC | PRN

## 2021-12-31 NOTE — Progress Notes (Signed)
Mobility Specialist Progress Note    12/31/21 1652  Mobility  Activity Ambulated independently in hallway  Level of Assistance Independent  Assistive Device None  Distance Ambulated (ft) 360 ft  Activity Response Tolerated well  $Mobility charge 1 Mobility   Pre-Mobility: 75 HR, 98% SpO2 Post-Mobility: 81 HR  Pt received in bed and agreeable. No complaints on walk. Returned to sitting EOB with call bell in reach. Slightly SOB with exertion.  Hildred Alamin Mobility Specialist

## 2021-12-31 NOTE — Hospital Course (Addendum)
Heart Failure EF 60 to 65%. Carvedilol morning and evening, hydralazine TID, Lasix 80 BID for 7 days  -exhausted -legs heavy -shortness of breath at rest while sitting up, on exertion as well worse in last week -sleeps with 3-4 pillows -denies PND -endorses orthopnea -clothes tighter -still can't walk to kitchen without SOB, has to stop in living room  -endorses   -peeing 4-5 times a night  Hypertension - home readings of 191/99 this morning, 151/80 after taking meds.. 140s at lowest.  CKD? -previously on losartan but dc'd due to kidney function -used to urinate 8-9 times a night until about 6 months ago -not hardly peeing at night at all now -peeing 2-3 times a day today  DMII -   Denies chest pains, vision changes since last discharge but feels like this is from lowered blood pressure but improving in last 2 days.   01/02/22 - Pt states lungs are feeling better and SOB is improving - Pt is "peeing like a racehorse"  - Not a lot of fluid on abdominal exam - Pt has been swollen for about 6 weeks, but intermittent in the last year - Denies pain on R abdomen  - Pt stopped drinking around 10 years ago - Discussed plan for U/S of liver    01/03/22  1-2+, can see calf muscles.  Subjective feelings of abdomen being swelling. Says this feels better  Breathing better, when walks around and gets short of breath now. Can actually talk with walking now.

## 2021-12-31 NOTE — H&P (Signed)
Date: 12/31/2021               Patient Name:  [name] MRN: [id number]  DOB: 08-16-1965 Age / Sex: 56 y.o., male   PCP: Linus Galas, MD         Medical Service: Internal Medicine Teaching Service         Attending Physician: Dr. Jimmye Norman, Elaina Pattee, MD    First Contact: Dr. Linward Natal, MD Pager: [phone]  Second Contact: Dr. Rick Duff, MD Pager: [phone]       After Hours (After 5p/  First Contact Pager: [phone]  weekends / holidays): Second Contact Pager: [phone]   Chief Complaint: swelling   History of Present Illness: [name] is a 56 yo male with a history of uncontrolled T2 DM, hypertension, Charcot foot deformity, and chronic nausea and vomiting who presents with worsened lower leg swelling and shortness of breath. Patient with recently diagnosed HFpEF echo 12/18/2021 with EF 60 to 65%. Admitted with volume overload thought to be secondary to severe symptomatic hypertension and heart failure exacerbation. Diuresed with improvement in symptoms and discharged on Lasix 40 mg daily. Had telehealth visit 3 days after discharge for swelling and subsequently increased to Lasix 80mg  daily. Seen in clinic following day and found to have rapid weight re-accumulation of 20 pounds since discharge. Increased Lasix to 80 mg BID.   Patient followed up in clinic the following week and had gained 14 pounds additionally despite Lasix 80 mg twice daily.  Estimated to be 35 pounds over her dry weight.  At that time, he also endorsed worsening dyspnea on exertion, orthopnea, close fitting tighter, edema extending up to chest.  He did deny chest pain at that time.  This led to concerns for acute on chronic CHF exacerbation with inadequate response to Lasix po in the setting of increased bowel wall edema.  He presents today as a direct admit for IV formulation to facilitate diuresis.  Today, patient endorses breathing difficulty when lying supine, shortness of breath at  rest, and worsening SOB on exertion. He states he feels exhausted. Denies symptoms of PND. Currently, cannot walk from bedroom to kitchen without stopping in living room. Endorses clothes fitting more tightly as well. Denies chest pain.  He is not on oxygen at baseline. Denies headaches but does endorse muscle cramp at left neck. Endorses blurred vision after discharge last week but this is improving.  Meds: Current Meds  Medication Sig   carvedilol (COREG) 25 MG tablet Take 1 tablet (25 mg total) by mouth 2 (two) times daily with a meal.   feeding supplement (BOOST HIGH PROTEIN) LIQD Take 1 Container by mouth daily.   furosemide (LASIX) 40 MG tablet Take 1 tablet (40 mg total) by mouth daily for 90 doses.   hydrALAZINE (APRESOLINE) 50 MG tablet Take 1 tablet (50 mg total) by mouth 3 (three) times daily.   prochlorperazine (COMPAZINE) 5 MG tablet Take 1 tablet (5 mg total) by mouth every 8 (eight) hours as needed for nausea or vomiting.   rosuvastatin (CRESTOR) 20 MG tablet Take 1 tablet (20 mg total) by mouth daily.     Allergies: Allergies as of 12/30/2021 - Review Complete 12/30/2021  Allergen Reaction Noted   Amlodipine Other (See Comments) 11/18/2021   Lisinopril Swelling and Other (See Comments) 12/17/2021   Past Medical History:  Diagnosis Date   Acute exacerbation of CHF (congestive heart failure) ([place]) 12/31/2021   Anxiety    Panic Atacks  Arthritis    Carpal tunnel syndrome    Constipation    Depression    Diabetes mellitus    DMII   Dysrhythmia    hx tachy hr   GERD (gastroesophageal reflux disease)    no problem in past few years.   Hepatitis    Hyperlipidemia    Hypertension    Neuropathy associated with endocrine disorder ([place])    Seizures ([place])    "stress related" no more since taking Klonopin 2 years- See Neurologist  [facility]   Shortness of breath dyspnea    with exertion   Sleep apnea    used a cpap when he was 350lb-does not need one now 260lb    Wears contact lenses     Family History:  Family History  Problem Relation Age of Onset   Cancer Mother        pancres   Diabetes Mother    Hypertension Brother    Renal cancer Brother    Cancer Maternal Uncle    Colon cancer Maternal Uncle    Colon cancer Maternal Uncle    Stomach cancer Maternal Uncle    Esophageal cancer Neg Hx    Rectal cancer Neg Hx     Social History:  Social History   Socioeconomic History   Marital status: Single    Spouse name: Not on file   Number of children: Not on file   Years of education: Not on file   Highest education level: Not on file  Occupational History   Not on file  Tobacco Use   Smoking status: Former    Types: Cigarettes    Quit date: 10/27/2008    Years since quitting: 13.1   Smokeless tobacco: Never   Tobacco comments:    Quit x 10 yrs.  Vaping Use   Vaping Use: Never used  Substance and Sexual Activity   Alcohol use: Not Currently    Comment: Beer sometimes.   Drug use: No    Types: Marijuana   Sexual activity: Not Currently    Partners: Male    Comment: last HIV test years ago.  Other Topics Concern   Not on file  Social History Narrative   Not on file   Social Determinants of Health   Financial Resource Strain: Not on file  Food Insecurity: Not on file  Transportation Needs: No Transportation Needs (12/22/2021)   PRAPARE - Hydrologist (Medical): No    Lack of Transportation (Non-Medical): No  Physical Activity: Not on file  Stress: Not on file  Social Connections: Not on file  Intimate Partner Violence: Not on file    Review of Systems: A complete ROS was negative except as per HPI.   Physical Exam: Blood pressure (!) 151/85, pulse 70, temperature 97.8 F (36.6 C), temperature source Oral, weight 103.2 kg, SpO2 99 %. Physical Exam Constitutional:      General: He is not in acute distress. HENT:     Head: Normocephalic and atraumatic.  Eyes:     Extraocular  Movements: Extraocular movements intact.  Cardiovascular:     Rate and Rhythm: Normal rate and regular rhythm.     Pulses:          Radial pulses are 2+ on the right side and 2+ on the left side.       Dorsalis pedis pulses are 2+ on the right side and 2+ on the left side.     Comments: II/VI systolic murmur  heard best of LLSB. Pulmonary:     Effort: No tachypnea or respiratory distress.     Comments: CTAB Musculoskeletal:     Right lower leg: 2+ Pitting Edema present.     Left lower leg: 2+ Pitting Edema present.  Skin:    General: Skin is warm and dry.     Comments: Large callus on left distal great toe.  Neurological:     General: No focal deficit present.     Mental Status: He is alert and oriented to person, place, and time.  Psychiatric:        Mood and Affect: Mood normal.        Behavior: Behavior normal.    EKG: personally reviewed my interpretation is normal sinus rhythm with LVH.  CXR: personally reviewed my interpretation is normal, no effusions or consolidation.  Assessment & Plan by Problem: Principal Problem:   Acute exacerbation of CHF (congestive heart failure) (HCC) Active Problems:   Uncontrolled type 2 diabetes mellitus with hyperglycemia (HCC)   Essential hypertension   Lower extremity edema   Acute-on-chronic kidney injury ([place]) Treshon Karle is a 56 yo male with a history of uncontrolled T2 DM, hypertension, Charcot foot deformity, and chronic nausea and vomiting who presents with worsened lower leg swelling and shortness of breath admitted for acute heart failure exacerbation.  #Acute exacerbation of HFpEF #Lower extremity edema Recent echo during admission 2 weeks ago with EF 60 to 65% and new diagnosis of HFpEF in the setting of severe symptomatic hypertension. Patient presents today with worsening lower extremity edema and shortness of breath despite serial increases in Lasix to 80 twice daily.  Concern for decreased efficacy of oral diuretic in the  setting of bowel wall edema.  Appears volume overloaded on exam with 2+ pitting edema bilaterally however lungs clear to auscultation bilaterally.  BNP 771.5, up from 693.2 yesterday. Weight up from 92.2 kg 11 days ago to 103.2 kg today.  Extremities warm and well perfused. -IV Lasix 120 mg -Strict I/Os -Daily weights   #Uncontrolled type 2 diabetes mellitus with hyperglycemia Last A1c 6.9.  Patient states he has not been taking his blood sugar at home as he is no longer taking any diabetes medications.  He endorses significant weight loss before recent hospitalization that he attributes to chronic nausea and vomiting.  Notable for very high microalbumin to creatinine ratio during last admission. -Semglee 5 u nightly + SSI -trend BMP, goal K >4.0 and Mg >2.0  #Essential hypertension Patient has a history of chronic uncontrolled hypertension.  Pressure today 151/85.  Patient endorses Coreg 25 mg twice daily and hydralazine 50 mg 3 times daily.  He states his systolics are in the 902I at home before he takes his morning medications but then 140s to 150s after.  He denies chest pain, headache, worsening of his vision. -Continue carvedilol 25 mg twice daily -Continue hydralazine 50 mg 3 times daily  #Acute on chronic kidney injury Baseline Cr before prior admission thought to be around 3.3.  Elevated to 4.47 today.  Renal US normal during last admission.  Uptrending Cr likely a result of cardiorenal syndrome.  Unable to obtain nephrology consultation at outpatient follow-up due to lack of insurance coverage. -Continue diuresis -Trend BMP -Monitor urine output  Diet: HH VTE: Xarelto IVF: None,None Code: Full  Dispo: Admit patient to Observation with expected length of stay less than 2 midnights.  Signed: Linward Natal, MD 12/31/2021, 6:20 PM  Pager: [phone] After 5pm on weekdays and  1pm on weekends: On Call pager: [phone]

## 2021-12-31 NOTE — Plan of Care (Signed)

## 2022-01-01 ENCOUNTER — Other Ambulatory Visit: Payer: Self-pay

## 2022-01-01 ENCOUNTER — Encounter (HOSPITAL_COMMUNITY): Payer: Self-pay | Admitting: Internal Medicine

## 2022-01-01 DIAGNOSIS — I5033 Acute on chronic diastolic (congestive) heart failure: Secondary | ICD-10-CM

## 2022-01-01 DIAGNOSIS — G4733 Obstructive sleep apnea (adult) (pediatric): Secondary | ICD-10-CM

## 2022-01-01 LAB — CBC
HCT: 25.1 % — ABNORMAL LOW (ref 39.0–52.0)
Hemoglobin: 8.2 g/dL — ABNORMAL LOW (ref 13.0–17.0)
MCH: 28.7 pg (ref 26.0–34.0)
MCHC: 32.7 g/dL (ref 30.0–36.0)
MCV: 87.8 fL (ref 80.0–100.0)
Platelets: 97 10*3/uL — ABNORMAL LOW (ref 150–400)
RBC: 2.86 MIL/uL — ABNORMAL LOW (ref 4.22–5.81)
RDW: 13.8 % (ref 11.5–15.5)
WBC: 5.6 10*3/uL (ref 4.0–10.5)
nRBC: 0 % (ref 0.0–0.2)

## 2022-01-01 LAB — CBC WITH DIFFERENTIAL/PLATELET
Abs Immature Granulocytes: 0.01 10*3/uL (ref 0.00–0.07)
Basophils Absolute: 0 10*3/uL (ref 0.0–0.1)
Basophils Relative: 1 %
Eosinophils Absolute: 0.2 10*3/uL (ref 0.0–0.5)
Eosinophils Relative: 3 %
HCT: 25.5 % — ABNORMAL LOW (ref 39.0–52.0)
Hemoglobin: 8 g/dL — ABNORMAL LOW (ref 13.0–17.0)
Immature Granulocytes: 0 %
Lymphocytes Relative: 22 %
Lymphs Abs: 1.3 10*3/uL (ref 0.7–4.0)
MCH: 28.3 pg (ref 26.0–34.0)
MCHC: 31.4 g/dL (ref 30.0–36.0)
MCV: 90.1 fL (ref 80.0–100.0)
Monocytes Absolute: 0.5 10*3/uL (ref 0.1–1.0)
Monocytes Relative: 9 %
Neutro Abs: 3.7 10*3/uL (ref 1.7–7.7)
Neutrophils Relative %: 65 %
Platelets: 99 10*3/uL — ABNORMAL LOW (ref 150–400)
RBC: 2.83 MIL/uL — ABNORMAL LOW (ref 4.22–5.81)
RDW: 13.8 % (ref 11.5–15.5)
WBC: 5.7 10*3/uL (ref 4.0–10.5)
nRBC: 0 % (ref 0.0–0.2)

## 2022-01-01 LAB — PROTIME-INR
INR: 2.2 — ABNORMAL HIGH (ref 0.8–1.2)
Prothrombin Time: 24 seconds — ABNORMAL HIGH (ref 11.4–15.2)

## 2022-01-01 LAB — BASIC METABOLIC PANEL
Anion gap: 8 (ref 5–15)
BUN: 69 mg/dL — ABNORMAL HIGH (ref 6–20)
CO2: 22 mmol/L (ref 22–32)
Calcium: 8.3 mg/dL — ABNORMAL LOW (ref 8.9–10.3)
Chloride: 105 mmol/L (ref 98–111)
Creatinine, Ser: 4.29 mg/dL — ABNORMAL HIGH (ref 0.61–1.24)
GFR, Estimated: 16 mL/min — ABNORMAL LOW (ref >60)
Glucose, Bld: 141 mg/dL — ABNORMAL HIGH (ref 70–99)
Potassium: 4.3 mmol/L (ref 3.5–5.1)
Sodium: 135 mmol/L (ref 135–145)

## 2022-01-01 LAB — GLUCOSE, CAPILLARY
Glucose-Capillary: 136 mg/dL — ABNORMAL HIGH (ref 70–99)
Glucose-Capillary: 134 mg/dL — ABNORMAL HIGH (ref 70–99)
Glucose-Capillary: 165 mg/dL — ABNORMAL HIGH (ref 70–99)
Glucose-Capillary: 175 mg/dL — ABNORMAL HIGH (ref 70–99)
Glucose-Capillary: 117 mg/dL — ABNORMAL HIGH (ref 70–99)
Glucose-Capillary: 191 mg/dL — ABNORMAL HIGH (ref 70–99)

## 2022-01-01 LAB — MAGNESIUM: Magnesium: 2.3 mg/dL (ref 1.7–2.4)

## 2022-01-01 LAB — TECHNOLOGIST SMEAR REVIEW: Plt Morphology: NORMAL

## 2022-01-01 MED ORDER — MELATONIN 5 MG PO TABS
5.0000 mg | ORAL_TABLET | Freq: Once | ORAL | Status: AC
  Administered 2022-01-01: 5 mg via ORAL
  Filled 2022-01-01: qty 1

## 2022-01-01 MED ORDER — FUROSEMIDE 10 MG/ML IJ SOLN
120.0000 mg | Freq: Two times a day (BID) | INTRAVENOUS | Status: DC
  Administered 2022-01-01 – 2022-01-04 (×8): 120 mg via INTRAVENOUS
  Filled 2022-01-01: qty 12
  Filled 2022-01-01: qty 10
  Filled 2022-01-01 (×2): qty 12
  Filled 2022-01-01: qty 10
  Filled 2022-01-01 (×2): qty 12
  Filled 2022-01-01: qty 2
  Filled 2022-01-01 (×3): qty 10
  Filled 2022-01-01: qty 12

## 2022-01-01 MED ORDER — FUROSEMIDE 10 MG/ML IJ SOLN: 120.0000 mg | Freq: Once | INTRAVENOUS | Status: DC

## 2022-01-01 MED ORDER — METOLAZONE 2.5 MG PO TABS
2.5000 mg | ORAL_TABLET | Freq: Every day | ORAL | Status: DC
  Administered 2022-01-01 – 2022-01-04 (×4): 2.5 mg via ORAL
  Filled 2022-01-01 (×4): qty 1

## 2022-01-01 NOTE — Plan of Care (Signed)
Discussed with patient plan of care for the evening, pain management and other no medical interventions of pain with some teach back displayed.  Problem: Education: Goal: Knowledge of General Education information will improve Description: Including pain rating scale, medication(s)/side effects and non-pharmacologic comfort measures Outcome: Progressing

## 2022-01-01 NOTE — Progress Notes (Signed)
Internal Medicine Clinic Attending ° °Case discussed with Dr. Gawaluck  At the time of the visit.  We reviewed the resident’s history and exam and pertinent patient test results.  I agree with the assessment, diagnosis, and plan of care documented in the resident’s note.  °

## 2022-01-01 NOTE — Progress Notes (Signed)
Mobility Specialist Progress Note    01/01/22 1137  Mobility  Activity Ambulated independently in hallway  Level of Assistance Independent after set-up  Assistive Device None  Distance Ambulated (ft) 420 ft  Activity Response Tolerated well  $Mobility charge 1 Mobility   Pre-Mobility: 72 HR Post-Mobility: 73 HR  Pt received in bed and agreeable. No complaints on walk. Returned to sitting EOB with call bell in reach.    Hildred Alamin Mobility Specialist

## 2022-01-01 NOTE — Addendum Note (Signed)
Addended by: Charise Killian on: 01/01/2022 10:05 AM   Modules accepted: Level of Service

## 2022-01-01 NOTE — Progress Notes (Signed)
HD#1 Subjective:   Summary: [name] is a 56 yo male with a history of uncontrolled T2 DM, hypertension, Charcot foot deformity, and chronic nausea and vomiting who presents with worsened lower leg swelling and shortness of breath.  Overnight Events: NAEO.  Swelling improved, urinating well. Nausea improved.  Used to have cpap machine but has not had one for years due to discomfort/fit. We discussed possibility of need for dialysis in the future with him. Brother was on dialysis. Patient states he died several years ago and this has motivated him to stay on top of his health.      Objective:  Vital signs in last 24 hours: Vitals:   01/01/22 0014 01/01/22 0415 01/01/22 0800 01/01/22 0917  BP: (!) 149/72 (!) 151/75 (!) 168/89   Pulse:  73 73 73  Resp: 14 20 18    Temp: 98.2 F (36.8 C) 98.3 F (36.8 C) 98 F (36.7 C)   TempSrc: Oral Oral Oral   SpO2: 97%  97%   Weight:  103.1 kg     Supplemental O2: Room Air SpO2: 97 %   Physical Exam:  Constitutional: well-appearing male sitting in hospital bed, in no acute distress HENT: normocephalic atraumatic, mucous membranes moist Eyes: conjunctiva non-erythematous Neck: supple Cardiovascular: regular rate and rhythm, II/VI systolic murmur over LLSB, JVD to edge of mandible, radial and DP pulses 2+, distal extremities warm and well perfused  Pulmonary/Chest: normal work of breathing on room air, lungs clear to auscultation bilaterally Abdominal: soft, non-tender, non-distended MSK: normal bulk and tone, 2+ pitting edema at lower extremities bilaterally Neurological: alert & oriented x 3 Skin: warm and dry Psych: Normal mood and affect  Filed Weights   12/31/21 1305 01/01/22 0415  Weight: 103.2 kg 103.1 kg     Intake/Output Summary (Last 24 hours) at 01/01/2022 1103 Last data filed at 01/01/2022 0900 Gross per 24 hour  Intake 240 ml  Output 1950 ml  Net -1710 ml   Net IO Since Admission: -1,710 mL [01/01/22  1103]  Pertinent Labs:    Latest Ref Rng & Units 01/01/2022   12:11 AM 12/31/2021    3:01 PM 12/20/2021    2:17 AM  CBC  WBC 4.0 - 10.5 K/uL 5.6  5.4  5.2   Hemoglobin 13.0 - 17.0 g/dL 8.2  8.8  9.2   Hematocrit 39.0 - 52.0 % 25.1  26.9  28.3   Platelets 150 - 400 K/uL 97  104  126        Latest Ref Rng & Units 01/01/2022   12:11 AM 12/31/2021    3:01 PM 12/30/2021    9:34 AM  CMP  Glucose 70 - 99 mg/dL 141  257  211   BUN 6 - 20 mg/dL 69  67  66   Creatinine 0.61 - 1.24 mg/dL 4.29  4.47  4.46   Sodium 135 - 145 mmol/L 135  136  137   Potassium 3.5 - 5.1 mmol/L 4.3  4.4  4.6   Chloride 98 - 111 mmol/L 105  106  108   CO2 22 - 32 mmol/L 22  23  19    Calcium 8.9 - 10.3 mg/dL 8.3  8.4  7.8   Total Protein 6.5 - 8.1 g/dL  6.1    Total Bilirubin 0.3 - 1.2 mg/dL  0.3    Alkaline Phos 38 - 126 U/L  52    AST 15 - 41 U/L  22    ALT 0 -  44 U/L  21      Imaging: DG Chest Port 1 View  Result Date: 12/31/2021 CLINICAL DATA:  Shortness of breath. EXAM: PORTABLE CHEST 1 VIEW COMPARISON:  Chest x-ray dated December 17, 2021. FINDINGS: The heart size and mediastinal contours are within normal limits. Both lungs are clear. The visualized skeletal structures are unremarkable. IMPRESSION: No active disease. Electronically Signed   By: Titus Dubin M.D.   On: 12/31/2021 17:13    Assessment/Plan:   Principal Problem:   Acute exacerbation of CHF (congestive heart failure) (HCC) Active Problems:   Uncontrolled type 2 diabetes mellitus with hyperglycemia (HCC)   Essential hypertension   Lower extremity edema   Acute-on-chronic kidney injury ([place])   Patient Summary: [name] is a 56 yo male with a history of uncontrolled T2 DM, hypertension, Charcot foot deformity, and chronic nausea and vomiting who presents with worsened lower leg swelling and shortness of breath admitted for acute heart failure exacerbation and AKI on CKD IV.   #Acute exacerbation of HFpEF, NYHA IV, Stage  C #Lower extremity edema -2L urine with Lasix 120 mg IV last night. Wt unchanged. Volume status mildly improved from yesterday. SOB improved. Feels his swelling has improved as well. Exam with 2+ pitting edema bilaterally today. Lungs CTAB. JVD  to edge of mandible. Extremities warm and well perfused. Patient's OSA may be contributing. -Increase IV Lasix 120 mg BID -Strict I/Os -Daily weights -CPAP   #Uncontrolled type 2 diabetes mellitus with hyperglycemia Last A1c 6.9.  Not on diabetes medications at home prior to admission.  Significant weight loss in the past year attributed to nausea and vomiting.  Fasting glucose 165 today. -Semglee 5 u nightly + SSI -trend BMP, goal K >4.0 and Mg >2.0   #Essential hypertension Blood pressure 151/75 today.  Goal systolic less than 034.  He denies chest pain or headache.  Shortness of breath improving. -Continue carvedilol 25 twice daily -Continue hydralazine 50 mg 3 times daily   #Acute on chronic kidney injury Baseline creatinine around 3.3.  Creatinine 4.47 on admission and improved to 4.29 today with diuresis.  Likely cardiorenal syndrome. -Continue diuresis -Nephrology consult outpatient if able to obtain orange card  #Normocytic anemia #Thrombocytopenia Hemoglobin 8.2 today down from 8.8 yesterday and thrombocytopenic to 97k.  This is a decrease from 12 1 month ago.. Iron labs normal during prior admission.  Possibility for hepatic congestion secondary to heart failure leading to thrombocytopenia.  LFTs within normal limits.  Will obtain additional labs and PT/INR for synthetic function.  CKD likely contributing to anemia and possibly thrombocytopenia.  Reassured that he is not pancytopenic. -PT/INR -Smear  #OSA Patient reports history of sleep study and subsequently wearing his CPAP.  He states it was uncomfortable and did not stay on his face so he eventually stopped using.  He says that he does snore.  He also endorses waking up several  times a night to urinate which can be a sign of apneic events. -CPAP  #Chronic nausea and vomiting Patient endorses long history of nausea and vomiting with previous work-up including gastric emptying scan, colonoscopy, EGD, CT without any obvious cause.  He does endorse cannabis use daily.  States symptoms have improved since discharge with Compazine as needed. -Compazine as needed    Diet: HH VTE: Xarelto IVF: None,None Code: Full    Dispo: Admit patient to Observation with expected length of stay less than 2 midnights.  Linward Natal MD Internal Medicine Resident PGY-1 Please contact the  on call pager after 5 pm and on weekends at [phone].

## 2022-01-02 ENCOUNTER — Inpatient Hospital Stay (HOSPITAL_COMMUNITY): Payer: Commercial Managed Care - HMO

## 2022-01-02 DIAGNOSIS — Z87891 Personal history of nicotine dependence: Secondary | ICD-10-CM | POA: Diagnosis not present

## 2022-01-02 DIAGNOSIS — I5033 Acute on chronic diastolic (congestive) heart failure: Secondary | ICD-10-CM | POA: Diagnosis not present

## 2022-01-02 LAB — COMPREHENSIVE METABOLIC PANEL
ALT: 15 U/L (ref 0–44)
AST: 16 U/L (ref 15–41)
Albumin: 2.2 g/dL — ABNORMAL LOW (ref 3.5–5.0)
Alkaline Phosphatase: 43 U/L (ref 38–126)
Anion gap: 8 (ref 5–15)
BUN: 67 mg/dL — ABNORMAL HIGH (ref 6–20)
CO2: 22 mmol/L (ref 22–32)
Calcium: 8.4 mg/dL — ABNORMAL LOW (ref 8.9–10.3)
Chloride: 105 mmol/L (ref 98–111)
Creatinine, Ser: 4.31 mg/dL — ABNORMAL HIGH (ref 0.61–1.24)
GFR, Estimated: 15 mL/min — ABNORMAL LOW (ref >60)
Glucose, Bld: 132 mg/dL — ABNORMAL HIGH (ref 70–99)
Potassium: 4 mmol/L (ref 3.5–5.1)
Sodium: 135 mmol/L (ref 135–145)
Total Bilirubin: 0.3 mg/dL (ref 0.3–1.2)
Total Protein: 5.5 g/dL — ABNORMAL LOW (ref 6.5–8.1)

## 2022-01-02 LAB — CBC
HCT: 24.7 % — ABNORMAL LOW (ref 39.0–52.0)
Hemoglobin: 8.1 g/dL — ABNORMAL LOW (ref 13.0–17.0)
MCH: 28.8 pg (ref 26.0–34.0)
MCHC: 32.8 g/dL (ref 30.0–36.0)
MCV: 87.9 fL (ref 80.0–100.0)
Platelets: 108 10*3/uL — ABNORMAL LOW (ref 150–400)
RBC: 2.81 MIL/uL — ABNORMAL LOW (ref 4.22–5.81)
RDW: 13.5 % (ref 11.5–15.5)
WBC: 4.9 10*3/uL (ref 4.0–10.5)
nRBC: 0 % (ref 0.0–0.2)

## 2022-01-02 LAB — GLUCOSE, CAPILLARY
Glucose-Capillary: 132 mg/dL — ABNORMAL HIGH (ref 70–99)
Glucose-Capillary: 282 mg/dL — ABNORMAL HIGH (ref 70–99)
Glucose-Capillary: 144 mg/dL — ABNORMAL HIGH (ref 70–99)

## 2022-01-02 LAB — MAGNESIUM: Magnesium: 2.2 mg/dL (ref 1.7–2.4)

## 2022-01-02 MED ORDER — MELATONIN 5 MG PO TABS
5.0000 mg | ORAL_TABLET | Freq: Every evening | ORAL | Status: DC | PRN
  Administered 2022-01-02 – 2022-01-04 (×3): 5 mg via ORAL
  Filled 2022-01-02 (×3): qty 1

## 2022-01-02 MED ORDER — ATORVASTATIN CALCIUM 40 MG PO TABS
40.0000 mg | ORAL_TABLET | Freq: Every day | ORAL | Status: DC
  Administered 2022-01-03 – 2022-01-05 (×3): 40 mg via ORAL
  Filled 2022-01-02 (×3): qty 1

## 2022-01-02 NOTE — TOC Progression Note (Signed)
Transition of Care [facility]) - Progression Note    Patient Details  Name: HAVISH PETTIES MRN: [id number] Date of Birth: 13-Dec-1965  Transition of Care [facility]) CM/SW Contact  Angelita Ingles, RN Phone Number:[phone]   01/02/2022, 3:46 PM  Clinical Narrative:     Transition of Care (TOC) Screening Note   Patient Details  Name: Yiannis Tulloch Mastandrea Date of Birth: Oct 24, 1965   Transition of Care [facility]) CM/SW Contact:    Angelita Ingles, RN Phone Number: 01/02/2022, 3:46 PM    Transition of Care Department [facility]) has reviewed patient and no TOC needs have been identified at this time. We will continue to monitor patient advancement through interdisciplinary progression rounds.           Expected Discharge Plan and Services                                                 Social Determinants of Health (SDOH) Interventions    Readmission Risk Interventions     No data to display

## 2022-01-02 NOTE — Plan of Care (Signed)
Discussed with patient plan of care for the evening, pain management and coping mechanisms and more natural ways to relieve his stress with some teach back displayed.  Problem: Education: Goal: Knowledge of General Education information will improve Description: Including pain rating scale, medication(s)/side effects and non-pharmacologic comfort measures Outcome: Progressing   Problem: Health Behavior/Discharge Planning: Goal: Ability to manage health-related needs will improve Outcome: Progressing   Problem: Coping: Goal: Level of anxiety will decrease Outcome: Progressing

## 2022-01-02 NOTE — Progress Notes (Signed)
Refused cpap.

## 2022-01-02 NOTE — Progress Notes (Signed)
Mobility Specialist Progress Note    01/02/22 1636  Mobility  Activity Ambulated independently in hallway  Level of Assistance Independent  Assistive Device None  Distance Ambulated (ft) 420 ft  Activity Response Tolerated well  $Mobility charge 1 Mobility   Pre-Mobility: 75 HR, 150/78 BP During Mobility: 84 HR Post-Mobility: 79 HR  Pt received in bed and agreeable. No complaints on walk. Returned to sitting EOB with call bell in reach.    [name] Mobility Specialist

## 2022-01-02 NOTE — Progress Notes (Signed)
HD#2 Subjective:   Summary: [name] is a 56 yo male with a history of uncontrolled T2 DM, hypertension, Charcot foot deformity, and chronic nausea and vomiting who presents with worsened lower leg swelling and shortness of breath.  Overnight Events: NAEO. Refused CPAP.  Patient states he feels much better today.  Endorses improvement with his SOB on exertion and swelling.  He endorses prior alcohol use but none currently.  Stopped drinking about 10 years ago.  Endorses edema for about 6 weeks but has also had swollen legs a couple weeks at a time on and off for the past year or so.  Denies right upper quadrant pain.  Patient states he is "peeing like a race horse."   Objective:  Vital signs in last 24 hours: Vitals:   01/02/22 0337 01/02/22 0500 01/02/22 0600 01/02/22 0804  BP: (!) 144/72   (!) 145/76  Pulse: 72   74  Resp:  17 16 19   Temp: 98.1 F (36.7 C)   98.2 F (36.8 C)  TempSrc: Oral   Oral  SpO2: 94%   94%  Weight: 98.9 kg     Height:       Supplemental O2: Room Air SpO2: 94 %   Physical Exam:  Constitutional: well-appearing male sitting in hospital bed, in no acute distress HENT: normocephalic atraumatic, mucous membranes moist Eyes: conjunctiva non-erythematous Neck: supple Cardiovascular: regular rate and rhythm, II/VI systolic murmur over LLSB, distal extremities warm and well perfused  Pulmonary/Chest: normal work of breathing on room air, lungs clear to auscultation bilaterally Abdominal: Edematous but no ascites MSK: normal bulk and tone, 2+ pitting edema at lower extremities bilaterally Neurological: alert & oriented x 3 Skin: warm and dry Psych: Normal mood and affect  Filed Weights   12/31/21 1305 01/01/22 0415 01/02/22 0337  Weight: 103.2 kg 103.1 kg 98.9 kg     Intake/Output Summary (Last 24 hours) at 01/02/2022 1014 Last data filed at 01/02/2022 0800 Gross per 24 hour  Intake 1293.97 ml  Output 4150 ml  Net -2856.03 ml   Net IO  Since Admission: -4,566.03 mL [01/02/22 1014]  Pertinent Labs:    Latest Ref Rng & Units 01/02/2022   12:14 AM 01/01/2022   12:11 AM 12/31/2021    3:01 PM  CBC  WBC 4.0 - 10.5 K/uL 4.9  5.6    5.7  5.4   Hemoglobin 13.0 - 17.0 g/dL 8.1  8.2    8.0  8.8   Hematocrit 39.0 - 52.0 % 24.7  25.1    25.5  26.9   Platelets 150 - 400 K/uL 108  97    99  104        Latest Ref Rng & Units 01/02/2022   12:14 AM 01/01/2022   12:11 AM 12/31/2021    3:01 PM  CMP  Glucose 70 - 99 mg/dL 132  141  257   BUN 6 - 20 mg/dL 67  69  67   Creatinine 0.61 - 1.24 mg/dL 4.31  4.29  4.47   Sodium 135 - 145 mmol/L 135  135  136   Potassium 3.5 - 5.1 mmol/L 4.0  4.3  4.4   Chloride 98 - 111 mmol/L 105  105  106   CO2 22 - 32 mmol/L 22  22  23    Calcium 8.9 - 10.3 mg/dL 8.4  8.3  8.4   Total Protein 6.5 - 8.1 g/dL 5.5   6.1   Total Bilirubin 0.3 -  1.2 mg/dL 0.3   0.3   Alkaline Phos 38 - 126 U/L 43   52   AST 15 - 41 U/L 16   22   ALT 0 - 44 U/L 15   21     Imaging: No results found.  Assessment/Plan:   Principal Problem:   Acute on chronic heart failure with preserved ejection fraction (HFpEF) (HCC) Active Problems:   Uncontrolled type 2 diabetes mellitus with hyperglycemia (HCC)   Essential hypertension   Lower extremity edema   Acute-on-chronic kidney injury (HCC)   OSA (obstructive sleep apnea)   Patient Summary: [name] is a 56 yo male with a history of uncontrolled T2 DM, hypertension, Charcot foot deformity, and chronic nausea and vomiting who presents with worsened lower leg swelling and shortness of breath admitted for acute exacerbation of his HFpEF and AKI on CKD IV.   #Acute exacerbation of HFpEF, NYHA IV, Stage C #Lower extremity edema Patient feels like his breathing and swelling are improved today.  Continues with mild improvements in fluid status on exam.  Better response to diuresis with net -2.5 L on metolazone 2.5 mg daily and IV lasix 120 mg BID.  Wt down 4 kg since  yesterday.  -Continue IV Lasix 120 mg BID -Continue metolazone 2.5 mg daily -Strict I/Os -Daily weights -CPAP   #Uncontrolled type 2 diabetes mellitus with hyperglycemia Last A1c 6.9.  Not on diabetes medications at home prior to admission.  Significant weight loss in the past year attributed to nausea and vomiting.  Fasting glucose 132 today with relatively well controlled postprandials.  -Semglee 5 u nightly + SSI -trend BMP, goal K >4.0 and Mg >2.0   #Essential hypertension Blood pressure 144/72 today.  Goal systolic less than 696.  On home regimen.  -Continue carvedilol 25 twice daily -Continue hydralazine 50 mg 3 times daily   #Acute on chronic kidney injury Baseline creatinine around 3.3.  Creatinine stable at 4.31 today with continued diuresis.  Still favor cardiorenal syndrome. -Continue diuresis -Nephrology consult outpatient if able to obtain orange card  #Normocytic anemia #Thrombocytopenia History of Hep C (treated) and alcohol abuse. RUQ [place] in 2021 suggestive of cirrhosis vs infiltrative disease with subsequent CTAP without hepatic pathology. Hemoglobin stable at 8.1 today. Platelets 108k. Iron labs normal during prior admission.  LFTs within normal limits.  PT/INR elevated this admission. Smear unremarkable. Possibility for hepatic congestion secondary to heart failure leading to thrombocytopenia and abnormal coags. CKD likely contributing to anemia and possibly thrombocytopenia as well.  Reassured that he is not pancytopenic. However, low platelets, elevated PT/INR, and low albumin cause concern for congestive hepatopathy vs cirrhosis. Patient has long history of nausea, vomiting and recent weight loss. Denies RUQ pain. No signs of ascites on exam. Will continue to treat HF exacerbation and get RUQ [place] to investigate for interval change in liver appearance. -RUQ [place] -daily cbc, cmp  #OSA Refusing CPAP. Will continue to encourage use as this could benefit him given htn and  HFpEF. -CPAP  #Chronic nausea and vomiting Patient endorses long history of nausea and vomiting with previous work-up including gastric emptying scan, colonoscopy, EGD, CT without any obvious cause.  He does endorse cannabis use daily. Cannabinoid hyperemesis syndrome may be contributing to his symptoms.  States symptoms have improved since discharge with Compazine as needed. -Compazine as needed    Diet: HH VTE: Xarelto IVF: None,None Code: Full    Admit to Inpatient (patient's expected length of stay will be greater than  2 midnights or inpatient only procedure)  Linward Natal MD Internal Medicine Resident PGY-1 Please contact the on call pager after 5 pm and on weekends at [phone].

## 2022-01-03 DIAGNOSIS — I5033 Acute on chronic diastolic (congestive) heart failure: Secondary | ICD-10-CM | POA: Diagnosis not present

## 2022-01-03 DIAGNOSIS — Z87891 Personal history of nicotine dependence: Secondary | ICD-10-CM | POA: Diagnosis not present

## 2022-01-03 DIAGNOSIS — R600 Localized edema: Secondary | ICD-10-CM | POA: Diagnosis not present

## 2022-01-03 LAB — COMPREHENSIVE METABOLIC PANEL
ALT: 15 U/L (ref 0–44)
AST: 16 U/L (ref 15–41)
Albumin: 2.3 g/dL — ABNORMAL LOW (ref 3.5–5.0)
Alkaline Phosphatase: 43 U/L (ref 38–126)
Anion gap: 10 (ref 5–15)
BUN: 69 mg/dL — ABNORMAL HIGH (ref 6–20)
CO2: 24 mmol/L (ref 22–32)
Calcium: 8.3 mg/dL — ABNORMAL LOW (ref 8.9–10.3)
Chloride: 101 mmol/L (ref 98–111)
Creatinine, Ser: 4.27 mg/dL — ABNORMAL HIGH (ref 0.61–1.24)
GFR, Estimated: 16 mL/min — ABNORMAL LOW (ref >60)
Glucose, Bld: 124 mg/dL — ABNORMAL HIGH (ref 70–99)
Potassium: 3.9 mmol/L (ref 3.5–5.1)
Sodium: 135 mmol/L (ref 135–145)
Total Bilirubin: 0.4 mg/dL (ref 0.3–1.2)
Total Protein: 5.8 g/dL — ABNORMAL LOW (ref 6.5–8.1)

## 2022-01-03 LAB — CBC
HCT: 25.2 % — ABNORMAL LOW (ref 39.0–52.0)
Hemoglobin: 8.4 g/dL — ABNORMAL LOW (ref 13.0–17.0)
MCH: 28.9 pg (ref 26.0–34.0)
MCHC: 33.3 g/dL (ref 30.0–36.0)
MCV: 86.6 fL (ref 80.0–100.0)
Platelets: 113 10*3/uL — ABNORMAL LOW (ref 150–400)
RBC: 2.91 MIL/uL — ABNORMAL LOW (ref 4.22–5.81)
RDW: 13.5 % (ref 11.5–15.5)
WBC: 5.2 10*3/uL (ref 4.0–10.5)
nRBC: 0 % (ref 0.0–0.2)

## 2022-01-03 LAB — MAGNESIUM: Magnesium: 2.1 mg/dL (ref 1.7–2.4)

## 2022-01-03 LAB — GLUCOSE, CAPILLARY
Glucose-Capillary: 132 mg/dL — ABNORMAL HIGH (ref 70–99)
Glucose-Capillary: 183 mg/dL — ABNORMAL HIGH (ref 70–99)

## 2022-01-03 NOTE — Progress Notes (Signed)
Pt states he doesn't wear CPAP at home.

## 2022-01-03 NOTE — Progress Notes (Addendum)
HD#3 Subjective:   Summary: [name] is a 56 yo male with a history of uncontrolled T2 DM, hypertension, Charcot foot deformity, and chronic nausea and vomiting who presents with worsened lower leg swelling and shortness of breath.  Overnight Events: NAEO.   Patient reports feeling much better today.  He feels that his legs are not as tight and swollen.  He is able to walk around the hall and his shortness of breath on exertion is much improved from when he came in.  He endorses good bowel movements, good appetite, peeing quite frequently but this is expected.  He states that his nausea is well controlled with Compazine.  He denies having vomited since he has been here.   Objective:  Vital signs in last 24 hours: Vitals:   01/03/22 0246 01/03/22 0443 01/03/22 0607 01/03/22 0731  BP:    (!) 148/81  Pulse:    75  Resp: 17 14  13   Temp:    98 F (36.7 C)  TempSrc:    Oral  SpO2:    98%  Weight:   95 kg   Height:       Supplemental O2: Room Air SpO2: 98 %   Physical Exam:  Constitutional: well-appearing male sitting in hospital bed, in no acute distress HENT: normocephalic atraumatic, mucous membranes moist Eyes: conjunctiva non-erythematous Neck: supple Cardiovascular: regular rate and rhythm, II/VI systolic murmur over LLSB, distal extremities warm and well perfused  Pulmonary/Chest: normal work of breathing on room air, lungs clear to auscultation bilaterally Abdominal: nondistended, nontender MSK: normal bulk and tone, 1-2+ pitting edema at lower extremities bilaterally Neurological: alert & oriented x 3 Skin: warm and dry Psych: Normal mood and affect  Filed Weights   01/01/22 0415 01/02/22 0337 01/03/22 0607  Weight: 103.1 kg 98.9 kg 95 kg     Intake/Output Summary (Last 24 hours) at 01/03/2022 1104 Last data filed at 01/03/2022 0732 Gross per 24 hour  Intake 1012 ml  Output 5350 ml  Net -4338 ml    Net IO Since Admission: -8,904.03 mL [01/03/22  1104]  Pertinent Labs:    Latest Ref Rng & Units 01/03/2022   12:39 AM 01/02/2022   12:14 AM 01/01/2022   12:11 AM  CBC  WBC 4.0 - 10.5 K/uL 5.2  4.9  5.6    5.7   Hemoglobin 13.0 - 17.0 g/dL 8.4  8.1  8.2    8.0   Hematocrit 39.0 - 52.0 % 25.2  24.7  25.1    25.5   Platelets 150 - 400 K/uL 113  108  97    99        Latest Ref Rng & Units 01/03/2022   12:39 AM 01/02/2022   12:14 AM 01/01/2022   12:11 AM  CMP  Glucose 70 - 99 mg/dL 124  132  141   BUN 6 - 20 mg/dL 69  67  69   Creatinine 0.61 - 1.24 mg/dL 4.27  4.31  4.29   Sodium 135 - 145 mmol/L 135  135  135   Potassium 3.5 - 5.1 mmol/L 3.9  4.0  4.3   Chloride 98 - 111 mmol/L 101  105  105   CO2 22 - 32 mmol/L 24  22  22    Calcium 8.9 - 10.3 mg/dL 8.3  8.4  8.3   Total Protein 6.5 - 8.1 g/dL 5.8  5.5    Total Bilirubin 0.3 - 1.2 mg/dL 0.4  0.3  Alkaline Phos 38 - 126 U/L 43  43    AST 15 - 41 U/L 16  16    ALT 0 - 44 U/L 15  15      Imaging: US Abdomen Limited RUQ (LIVER/GB)  Result Date: 01/02/2022 CLINICAL DATA:  History of alcohol viral hepatitis EXAM: ULTRASOUND ABDOMEN LIMITED RIGHT UPPER QUADRANT COMPARISON:  CT 01/18/2020 FINDINGS: Gallbladder: No gallstones or wall thickening visualized. No sonographic Murphy sign noted by sonographer. Common bile duct: Diameter: 3.7 mm Liver: Negative for focal mass. No convincing surface nodularity. Portal vein is patent on color Doppler imaging with normal direction of blood flow towards the liver. Other: Right kidney is possibly echogenic IMPRESSION: 1. Negative for hepatic mass lesion. 2. Possible slightly echogenic right kidney as may be seen with medical renal disease Electronically Signed   By: Donavan Foil M.D.   On: 01/02/2022 18:19    Assessment/Plan:   Principal Problem:   Acute on chronic heart failure with preserved ejection fraction (HFpEF) (HCC) Active Problems:   Uncontrolled type 2 diabetes mellitus with hyperglycemia (HCC)   Essential hypertension   Lower  extremity edema   Acute-on-chronic kidney injury (HCC)   OSA (obstructive sleep apnea)   Patient Summary: [name] is a 56 yo male with a history of uncontrolled T2 DM, hypertension, Charcot foot deformity, and chronic nausea and vomiting who presents with worsened lower leg swelling and shortness of breath admitted for acute exacerbation of his HFpEF and AKI on CKD IV.   #Acute exacerbation of HFpEF, NYHA IV, Stage C #Lower extremity edema Continues to improve clinically with IV Lasix and metolazone.  Still volume up.  Weight approaching baseline, down 4 kg since yesterday.  Net -4 L.  Creatinine and electrolytes stable. -Continue IV Lasix 120 mg BID -Continue metolazone 2.5 mg daily -Strict I/Os -Daily weights -CPAP   #Uncontrolled type 2 diabetes mellitus with hyperglycemia Last A1c 6.9.  Not on diabetes medications at home prior to admission.  Significant weight loss in the past year attributed to nausea and vomiting.  Fasting glucose 132 today with relatively well controlled postprandials. One high BG after receiving dextrose with his high dose furosemide IV. Will reach out to pharmacy if this continues to be a problem. -Semglee 5 u nightly + SSI -trend BMP, goal K >4.0 and Mg >2.0   #Essential hypertension Blood pressure 119/60 today.  Goal systolic less than 101.  On home regimen.  -Continue carvedilol 25 twice daily -Continue hydralazine 50 mg 3 times daily   #Acute on chronic kidney injury Baseline creatinine around 3.3.  Creatinine stable at 4.27 today with continued diuresis.  Still favor cardiorenal syndrome.  Good urine output. -Continue diuresis -Nephrology consult outpatient if able to obtain orange card  #Normocytic anemia #Thrombocytopenia History of Hep C (treated) and alcohol abuse. RUQ [place] in 2021 suggestive of cirrhosis vs infiltrative disease but subsequent CTAP without hepatic pathology.  Hemoglobin stable at 8.4 and platelets stable at 113.  PT/INR of  24/2.2.  Albumin 2.2.  BNP elevation indicating edema due to HFpEF.  Smear unremarkable. Possibility for hepatic congestion secondary to heart failure leading to thrombocytopenia and abnormal coags. CKD likely contributing to anemia and possibly thrombocytopenia as well.  Reassured that he is not pancytopenic. However, low platelets, elevated PT/INR, and low albumin cause concern for congestive hepatopathy.  Less concern for cirrhosis after repeat RUQ [place] yesterday without surface nodularity or biliary disease.  Portal vein patent with normal direction of flow but no  other details on hepatic veins or whether or not IVC dilated.  Will continue to treat HF exacerbation.  Asymptomatic, this can be worked up outpatient if labs do not normalize. -daily cbc, cmp  #OSA Refusing CPAP. Will continue to encourage use as this could benefit him given htn and HFpEF. -CPAP  #Chronic nausea and vomiting Patient endorses long history of nausea and vomiting with previous work-up including gastric emptying scan, colonoscopy, EGD, CT without any obvious cause.  He does endorse cannabis use daily. Cannabinoid hyperemesis syndrome versus cyclic vomiting syndrome may be contributing to his symptoms.  States symptoms have improved since discharge with Compazine as needed.  Denies vomiting since admission. -Compazine as needed    Diet: HH VTE: Xarelto IVF: None,None Code: Full    Admit to Inpatient (patient's expected length of stay will be greater than 2 midnights or inpatient only procedure)  Linward Natal MD Internal Medicine Resident PGY-1 Please contact the on call pager after 5 pm and on weekends at [phone].

## 2022-01-04 DIAGNOSIS — R600 Localized edema: Secondary | ICD-10-CM | POA: Diagnosis not present

## 2022-01-04 DIAGNOSIS — I5033 Acute on chronic diastolic (congestive) heart failure: Secondary | ICD-10-CM | POA: Diagnosis not present

## 2022-01-04 DIAGNOSIS — Z87891 Personal history of nicotine dependence: Secondary | ICD-10-CM | POA: Diagnosis not present

## 2022-01-04 LAB — BASIC METABOLIC PANEL
Anion gap: 9 (ref 5–15)
BUN: 71 mg/dL — ABNORMAL HIGH (ref 6–20)
CO2: 26 mmol/L (ref 22–32)
Calcium: 8.2 mg/dL — ABNORMAL LOW (ref 8.9–10.3)
Chloride: 99 mmol/L (ref 98–111)
Creatinine, Ser: 4.47 mg/dL — ABNORMAL HIGH (ref 0.61–1.24)
GFR, Estimated: 15 mL/min — ABNORMAL LOW (ref >60)
Glucose, Bld: 147 mg/dL — ABNORMAL HIGH (ref 70–99)
Potassium: 3.8 mmol/L (ref 3.5–5.1)
Sodium: 134 mmol/L — ABNORMAL LOW (ref 135–145)

## 2022-01-04 LAB — CBC
HCT: 26 % — ABNORMAL LOW (ref 39.0–52.0)
Hemoglobin: 8.7 g/dL — ABNORMAL LOW (ref 13.0–17.0)
MCH: 29 pg (ref 26.0–34.0)
MCHC: 33.5 g/dL (ref 30.0–36.0)
MCV: 86.7 fL (ref 80.0–100.0)
Platelets: 110 10*3/uL — ABNORMAL LOW (ref 150–400)
RBC: 3 MIL/uL — ABNORMAL LOW (ref 4.22–5.81)
RDW: 13.2 % (ref 11.5–15.5)
WBC: 5.4 10*3/uL (ref 4.0–10.5)
nRBC: 0 % (ref 0.0–0.2)

## 2022-01-04 LAB — PROTIME-INR
INR: 1.5 — ABNORMAL HIGH (ref 0.8–1.2)
Prothrombin Time: 17.9 seconds — ABNORMAL HIGH (ref 11.4–15.2)

## 2022-01-04 LAB — GLUCOSE, CAPILLARY
Glucose-Capillary: 132 mg/dL — ABNORMAL HIGH (ref 70–99)
Glucose-Capillary: 157 mg/dL — ABNORMAL HIGH (ref 70–99)
Glucose-Capillary: 144 mg/dL — ABNORMAL HIGH (ref 70–99)
Glucose-Capillary: 165 mg/dL — ABNORMAL HIGH (ref 70–99)

## 2022-01-04 LAB — MAGNESIUM: Magnesium: 2.1 mg/dL (ref 1.7–2.4)

## 2022-01-04 MED ORDER — POTASSIUM CHLORIDE 20 MEQ PO PACK
20.0000 meq | PACK | Freq: Once | ORAL | Status: AC
  Administered 2022-01-04: 20 meq via ORAL
  Filled 2022-01-04: qty 1

## 2022-01-04 NOTE — Progress Notes (Signed)
Mobility Specialist Progress Note:   01/04/22 1141  Mobility  Activity Ambulated independently in hallway  Level of Assistance Independent  Assistive Device None  Distance Ambulated (ft) 420 ft  Activity Response Tolerated well  $Mobility charge 1 Mobility   Pt received in bed and eager. No complaints. Pt left ambulating in room with all needs met and call bell in reach.   Shamecka Hocutt Mobility Specialist-Acute Rehab Secure Chat only

## 2022-01-04 NOTE — Progress Notes (Signed)
Pt doesn't want to wear CPAP 

## 2022-01-04 NOTE — Progress Notes (Signed)
HD#4 Subjective:   Summary: [name] is a 56 yo male with a history of uncontrolled T2 DM, hypertension, Charcot foot deformity, and chronic nausea and vomiting who presents with worsened lower leg swelling and shortness of breath.  Overnight Events: NAEO.   Patient continues to feel that his breathing is much improved.  He notes that he is able to ambulate and converse, which was previously limited by his dyspnea.  He does note that his appetite is still not where it used to be but he has not had any episodes of emesis and feels his nausea is well controlled without any medications.  Patient denies any chest pain.   Objective:  Vital signs in last 24 hours: Vitals:   01/03/22 2308 01/04/22 0435 01/04/22 0717 01/04/22 1129  BP: (!) 106/55 (!) 124/92 (!) 149/77 (!) 166/88  Pulse:   75 72  Resp: 17 14 15 17   Temp: 98.4 F (36.9 C) 98.4 F (36.9 C) 98.1 F (36.7 C) 97.6 F (36.4 C)  TempSrc: Oral Oral Oral Oral  SpO2: 95% 95% 97% 98%  Weight:      Height:       Supplemental O2: Room Air SpO2: 98 %   Physical Exam:   Constitutional: Well-developed, well-nourished, and in no distress.  HENT:  Neck: Normal range of motion.  Cardiovascular: Normal rate, regular rhythm, intact distal pulses. No gallop and no friction rub.  No murmur heard. Trace-1+ BLE edema. No JVD Pulmonary: Non labored breathing on room air, no wheezing or rales  Abdominal: Soft. Normal bowel sounds. Non distended and non tender Musculoskeletal: Normal range of motion.        General: No tenderness Neurological: Alert and oriented to person, place, and time. Non focal  Skin: Skin is warm and dry.    Filed Weights   01/01/22 0415 01/02/22 0337 01/03/22 0607  Weight: 103.1 kg 98.9 kg 95 kg     Intake/Output Summary (Last 24 hours) at 01/04/2022 1405 Last data filed at 01/04/2022 1132 Gross per 24 hour  Intake 120 ml  Output 2750 ml  Net -2630 ml    Net IO Since Admission: -12,934.03 mL  [01/04/22 1405]  Pertinent Labs:    Latest Ref Rng & Units 01/04/2022   12:28 AM 01/03/2022   12:39 AM 01/02/2022   12:14 AM  CBC  WBC 4.0 - 10.5 K/uL 5.4  5.2  4.9   Hemoglobin 13.0 - 17.0 g/dL 8.7  8.4  8.1   Hematocrit 39.0 - 52.0 % 26.0  25.2  24.7   Platelets 150 - 400 K/uL 110  113  108        Latest Ref Rng & Units 01/04/2022   12:28 AM 01/03/2022   12:39 AM 01/02/2022   12:14 AM  CMP  Glucose 70 - 99 mg/dL 147  124  132   BUN 6 - 20 mg/dL 71  69  67   Creatinine 0.61 - 1.24 mg/dL 4.47  4.27  4.31   Sodium 135 - 145 mmol/L 134  135  135   Potassium 3.5 - 5.1 mmol/L 3.8  3.9  4.0   Chloride 98 - 111 mmol/L 99  101  105   CO2 22 - 32 mmol/L 26  24  22    Calcium 8.9 - 10.3 mg/dL 8.2  8.3  8.4   Total Protein 6.5 - 8.1 g/dL  5.8  5.5   Total Bilirubin 0.3 - 1.2 mg/dL  0.4  0.3  Alkaline Phos 38 - 126 U/L  43  43   AST 15 - 41 U/L  16  16   ALT 0 - 44 U/L  15  15     Imaging: No results found.  Assessment/Plan:   Principal Problem:   Acute on chronic heart failure with preserved ejection fraction (HFpEF) (HCC) Active Problems:   Uncontrolled type 2 diabetes mellitus with hyperglycemia (HCC)   Essential hypertension   Lower extremity edema   Acute-on-chronic kidney injury (HCC)   OSA (obstructive sleep apnea)   Patient Summary: [name] is a 56 yo male with a history of uncontrolled T2 DM, hypertension, Charcot foot deformity, and chronic nausea and vomiting who presents with worsened lower leg swelling and shortness of breath admitted for acute exacerbation of his HFpEF and AKI on CKD IV.   #Acute exacerbation of HFpEF, NYHA IV, Stage C #Lower extremity edema Continues to improve clinically with IV Lasix and metolazone.  Still volume up.  Weight approaching baseline. Weight from today pending..  Net -3.59 L over the last 24h and net -12L since admission.  Creatinine and electrolytes stable. -Continue IV Lasix 120 mg BID, can likely transition to PO torsemide  60mg  BID in the AM.  -discontinue metolazone 2.5 mg daily -Strict I/Os -Daily weights -CPAP   #Uncontrolled type 2 diabetes mellitus with hyperglycemia Last A1c 6.9.  Not on diabetes medications at home prior to admission.  Significant weight loss in the past year attributed to nausea and vomiting.  Fasting glucose 132 today with relatively well controlled postprandials. One high BG after receiving dextrose with his high dose furosemide IV. Will reach out to pharmacy if this continues to be a problem. -Semglee 5 u nightly + SSI -trend BMP, goal K >4.0 and Mg >2.0   #Essential hypertension Continue home regimen. -Continue carvedilol 25 twice daily -Continue hydralazine 50 mg 3 times daily   #Acute on chronic kidney injury Baseline creatinine around 3.3.  Creatinine stable at 4.47today with continued diuresis.  Still favor cardiorenal syndrome.  Good urine output. -Continue diuresis -Will need Nephrology consult outpatient, notes he now has [employer]   #Normocytic anemia #Thrombocytopenia History of Hep C (treated) and alcohol abuse. RUQ [place] in 2021 suggestive of cirrhosis vs infiltrative disease but subsequent CTAP without hepatic pathology.  Hemoglobin stable at 8.4 and platelets stable at 113.  PT/INR of 24/2.2.  Albumin 2.2.  BNP elevation indicating edema due to HFpEF.  Smear unremarkable. Possibility for hepatic congestion secondary to heart failure leading to thrombocytopenia and abnormal coags. CKD likely contributing to anemia and possibly thrombocytopenia as well.  Reassured that he is not pancytopenic. However, low platelets, elevated PT/INR, and low albumin cause concern for congestive hepatopathy.  Less concern for cirrhosis after repeat RUQ [place] yesterday without surface nodularity or biliary disease.  Portal vein patent with normal direction of flow but no other details on hepatic veins or whether or not IVC dilated.  Will continue to treat HF exacerbation.  Asymptomatic, this can be  worked up outpatient if labs do not normalize. -daily cbc, cmp  #OSA Refusing CPAP. Will continue to encourage use as this could benefit him given htn and HFpEF. -CPAP  #Chronic nausea and vomiting Patient endorses long history of nausea and vomiting with previous work-up including gastric emptying scan, colonoscopy, EGD, CT without any obvious cause.  He does endorse cannabis use daily. Cannabinoid hyperemesis syndrome versus cyclic vomiting syndrome may be contributing to his symptoms.  States symptoms have improved since  discharge with Compazine as needed.  Denies vomiting since admission. -Compazine as needed    Diet: HH VTE: Xarelto IVF: None,None Code: Full    Admit to Inpatient (patient's expected length of stay will be greater than 2 midnights or inpatient only procedure)  Rick Duff, MD PGY-3 Internal Medicine  Pager [phone]

## 2022-01-05 ENCOUNTER — Other Ambulatory Visit (HOSPITAL_COMMUNITY): Payer: Self-pay

## 2022-01-05 ENCOUNTER — Encounter: Payer: Commercial Managed Care - HMO | Admitting: Internal Medicine

## 2022-01-05 DIAGNOSIS — Z87891 Personal history of nicotine dependence: Secondary | ICD-10-CM | POA: Diagnosis not present

## 2022-01-05 DIAGNOSIS — I5033 Acute on chronic diastolic (congestive) heart failure: Secondary | ICD-10-CM | POA: Diagnosis not present

## 2022-01-05 LAB — BASIC METABOLIC PANEL
Anion gap: 11 (ref 5–15)
BUN: 76 mg/dL — ABNORMAL HIGH (ref 6–20)
CO2: 24 mmol/L (ref 22–32)
Calcium: 8.3 mg/dL — ABNORMAL LOW (ref 8.9–10.3)
Chloride: 99 mmol/L (ref 98–111)
Creatinine, Ser: 4.82 mg/dL — ABNORMAL HIGH (ref 0.61–1.24)
GFR, Estimated: 14 mL/min — ABNORMAL LOW (ref >60)
Glucose, Bld: 116 mg/dL — ABNORMAL HIGH (ref 70–99)
Potassium: 3.7 mmol/L (ref 3.5–5.1)
Sodium: 134 mmol/L — ABNORMAL LOW (ref 135–145)

## 2022-01-05 LAB — MAGNESIUM: Magnesium: 1.9 mg/dL (ref 1.7–2.4)

## 2022-01-05 LAB — GLUCOSE, CAPILLARY
Glucose-Capillary: 201 mg/dL — ABNORMAL HIGH (ref 70–99)
Glucose-Capillary: 143 mg/dL — ABNORMAL HIGH (ref 70–99)
Glucose-Capillary: 163 mg/dL — ABNORMAL HIGH (ref 70–99)
Glucose-Capillary: 111 mg/dL — ABNORMAL HIGH (ref 70–99)
Glucose-Capillary: 161 mg/dL — ABNORMAL HIGH (ref 70–99)

## 2022-01-05 LAB — CBC
HCT: 27.7 % — ABNORMAL LOW (ref 39.0–52.0)
Hemoglobin: 9 g/dL — ABNORMAL LOW (ref 13.0–17.0)
MCH: 28.2 pg (ref 26.0–34.0)
MCHC: 32.5 g/dL (ref 30.0–36.0)
MCV: 86.8 fL (ref 80.0–100.0)
Platelets: 118 10*3/uL — ABNORMAL LOW (ref 150–400)
RBC: 3.19 MIL/uL — ABNORMAL LOW (ref 4.22–5.81)
RDW: 13.1 % (ref 11.5–15.5)
WBC: 4.8 10*3/uL (ref 4.0–10.5)
nRBC: 0 % (ref 0.0–0.2)

## 2022-01-05 MED ORDER — TORSEMIDE 20 MG PO TABS
40.0000 mg | ORAL_TABLET | Freq: Two times a day (BID) | ORAL | 2 refills | Status: DC
  Filled 2022-01-05: qty 120, 30d supply, fill #0

## 2022-01-05 MED ORDER — TORSEMIDE 20 MG PO TABS
60.0000 mg | ORAL_TABLET | Freq: Two times a day (BID) | ORAL | Status: DC
  Administered 2022-01-05: 60 mg via ORAL
  Filled 2022-01-05: qty 3

## 2022-01-05 MED ORDER — ATORVASTATIN CALCIUM 40 MG PO TABS
40.0000 mg | ORAL_TABLET | Freq: Every day | ORAL | 2 refills | Status: DC
  Filled 2022-01-05: qty 30, 30d supply, fill #0

## 2022-01-05 MED ORDER — TORSEMIDE 20 MG PO TABS: 40.0000 mg | ORAL_TABLET | Freq: Two times a day (BID) | ORAL | Status: DC

## 2022-01-05 NOTE — Plan of Care (Signed)
  Problem: Education: Goal: Knowledge of General Education information will improve Description: Including pain rating scale, medication(s)/side effects and non-pharmacologic comfort measures Outcome: Adequate for Discharge   Problem: Health Behavior/Discharge Planning: Goal: Ability to manage health-related needs will improve Outcome: Adequate for Discharge   Problem: Clinical Measurements: Goal: Ability to maintain clinical measurements within normal limits will improve Outcome: Adequate for Discharge Goal: Will remain free from infection Outcome: Adequate for Discharge Goal: Diagnostic test results will improve Outcome: Adequate for Discharge Goal: Respiratory complications will improve Outcome: Adequate for Discharge Goal: Cardiovascular complication will be avoided Outcome: Adequate for Discharge   Problem: Activity: Goal: Risk for activity intolerance will decrease Outcome: Adequate for Discharge   Problem: Nutrition: Goal: Adequate nutrition will be maintained Outcome: Adequate for Discharge   Problem: Coping: Goal: Level of anxiety will decrease Outcome: Adequate for Discharge   Problem: Elimination: Goal: Will not experience complications related to bowel motility Outcome: Adequate for Discharge Goal: Will not experience complications related to urinary retention Outcome: Adequate for Discharge   Problem: Pain Managment: Goal: General experience of comfort will improve Outcome: Adequate for Discharge   Problem: Safety: Goal: Ability to remain free from injury will improve Outcome: Adequate for Discharge   Problem: Skin Integrity: Goal: Risk for impaired skin integrity will decrease Outcome: Adequate for Discharge   Problem: Education: Goal: Ability to describe self-care measures that may prevent or decrease complications (Diabetes Survival Skills Education) will improve Outcome: Adequate for Discharge Goal: Individualized Educational Video(s) Outcome:  Adequate for Discharge   Problem: Coping: Goal: Ability to adjust to condition or change in health will improve Outcome: Adequate for Discharge   Problem: Fluid Volume: Goal: Ability to maintain a balanced intake and output will improve Outcome: Adequate for Discharge   Problem: Health Behavior/Discharge Planning: Goal: Ability to identify and utilize available resources and services will improve Outcome: Adequate for Discharge Goal: Ability to manage health-related needs will improve Outcome: Adequate for Discharge   Problem: Metabolic: Goal: Ability to maintain appropriate glucose levels will improve Outcome: Adequate for Discharge   Problem: Skin Integrity: Goal: Risk for impaired skin integrity will decrease Outcome: Adequate for Discharge   Problem: Tissue Perfusion: Goal: Adequacy of tissue perfusion will improve Outcome: Adequate for Discharge

## 2022-01-05 NOTE — Progress Notes (Signed)
Mobility Specialist Progress Note    01/05/22 0935  Mobility  Activity Ambulated independently in hallway  Activity Response Tolerated well  Distance Ambulated (ft) 420 ft  $Mobility charge 1 Mobility  Level of Assistance Independent  Assistive Device None  Mobility Referral No   Pre-Mobility: 71 HR During Mobility: 76 HR Post-Mobility: 73 HR  Pt received in chair and agreeable. No complaints on walk. Returned to chair with call bell in reach.    Hildred Alamin Mobility Specialist

## 2022-01-05 NOTE — Discharge Summary (Signed)
Name: [name] MRN: [id number] DOB: 07/25/1965 56 y.o. PCP: Linus Galas, MD  Date of Admission: 12/31/2021 12:45 PM Date of Discharge: 01/05/2022  1:18 PM Attending Physician: No att. providers found  Discharge Diagnosis: 1. Principal Problem:   Acute on chronic heart failure with preserved ejection fraction (HFpEF) (HCC) Active Problems:   Uncontrolled type 2 diabetes mellitus with hyperglycemia (HCC)   Essential hypertension   Lower extremity edema   Acute-on-chronic kidney injury (HCC)   OSA (obstructive sleep apnea)   Discharge Medications: Allergies as of 01/05/2022       Reactions   Norvasc [amlodipine] Other (See Comments)   Bilateral leg swelling   Zestril [lisinopril] Swelling, Other (See Comments)   Face and legs swell        Medication List     STOP taking these medications    furosemide 40 MG tablet Commonly known as: LASIX   INSULIN SYRINGE .3CC/31GX5/16" 31G X 5/16" 0.3 ML Misc   rosuvastatin 20 MG tablet Commonly known as: CRESTOR       TAKE these medications    atorvastatin 40 MG tablet Commonly known as: LIPITOR Take 1 tablet (40 mg total) by mouth daily. Start taking on: January 06, 2022   carvedilol 25 MG tablet Commonly known as: COREG Take 1 tablet (25 mg total) by mouth 2 (two) times daily with a meal.   feeding supplement Liqd Take 1 Container by mouth daily.   hydrALAZINE 50 MG tablet Commonly known as: APRESOLINE Take 1 tablet (50 mg total) by mouth 3 (three) times daily.   prochlorperazine 5 MG tablet Commonly known as: COMPAZINE Take 1 tablet (5 mg total) by mouth every 8 (eight) hours as needed for nausea or vomiting.   torsemide 20 MG tablet Commonly known as: DEMADEX Take 2 tablets (40 mg total) by mouth 2 (two) times daily.        Disposition and follow-up:   [name] was discharged from [facility] in Good condition.  At the hospital follow up visit please  address:  1.  HFpEF exacerbation: Patient much improved with IV Lasix.  Will go home with torsemide 40 mg twice daily.  Instructed on taking daily weights.  Follow-up clinical status to ensure no longer in exacerbation.  Trace pitting edema bilaterally on day of discharge.  Lungs clear.  AKI on CKD IV: Possibility of overdiuresis on last day of admission but creatinine stable/slightly improved since admission value increased from baseline.  Patient discharging with torsemide 40 mg twice daily.  Follow-up kidney function.  Placed nephrology referral now that patient has insurance.  Please follow-up on this.  Liver function: INR elevated, albumin low, normocytic anemia, smear unremarkable, thrombocytopenic.  History of hep C and alcohol abuse.  RUQ [place] without signs of cirrhosis this admission.  History of chronic nausea and vomiting of unknown etiology.  Follow-up outpatient, could consider elastography.  INR downtrending, question congestive hepatopathy. F/u with CBC, CMP, PT/INR.  T2DM: Patient had not been on medication since last discharge.  Will defer discussion of his diabetes to follow-up visit.   2.  Labs / imaging needed at time of follow-up: CMP, CBC, PT/INR  3.  Pending labs/ test needing follow-up: none  Follow-up Appointments:   Hospital Course by problem list: 1. HFpEF exacerbation: Recent echo with EF 60 to 65%. Patient initially presented as a direct admit after weight gain/fluid status worsening despite escalating home Lasix.  He has responded well to diuresis with Lasix  120 mg twice daily IV and metolazone 2.5 mg daily.  He is back at his baseline weight.  Net -13.6 L since admission.  His swelling and breathing status are back to baseline for him.  Instructed on use of compression stockings to help with his lower leg edema.  He will go home with torsemide 40 mg twice daily.  Instructed to follow-up in clinic later this week.  2.  AKI on CKD IV: Baseline creatinine around 3.3  prior to admission.  Creatinine 4.46 on day of admission.  Felt to be result of cardiorenal syndrome.  Mild improvement with diuresis.  Worsened on day of discharge, likely due to overdiuresis.  He has not been able to see nephrology in the past due to insurance coverage but now has insurance.  Placed referral for nephrology outpatient.  3.  Liver function: History of Hep C (treated) and alcohol abuse, though endorses no alcohol in 4 years. RUQ [place] in 2021 suggestive of cirrhosis vs infiltrative disease but subsequent CTAP without hepatic pathology.  Normocytic anemia, thrombocytopenia. PT/INR of 24/2.2 initially but 17.9/1.5 on repeat.  Albumin 2.2.  Smear unremarkable. Possibility for hepatic congestion secondary to heart failure leading to thrombocytopenia and abnormal coags. CKD likely contributing to anemia and possibly thrombocytopenia as well.  Reassured that he is not pancytopenic. However, low platelets, elevated PT/INR, and low albumin cause concern for congestive hepatopathy.  Less concern for cirrhosis after repeat RUQ [place] without surface nodularity or biliary disease.  Asymptomatic, this can be worked up outpatient if labs do not normalize. Consider elastography.  Discharge subjective:  Discharge Exam:   BP (!) 146/74 (BP Location: Left Arm)   Pulse 71   Temp 98.6 F (37 C) (Oral)   Resp 13   Ht 5\' 10"  (1.778 m)   Wt 90.4 kg   SpO2 97%   BMI 28.60 kg/m  Discharge exam: Physical Exam Constitutional:      General: He is not in acute distress. HENT:     Head: Normocephalic and atraumatic.  Eyes:     Extraocular Movements: Extraocular movements intact.  Cardiovascular:     Comments: Trace pitting edema bilaterally. No JVD. Pulmonary:     Effort: Pulmonary effort is normal.  Abdominal:     General: There is no distension.     Palpations: Abdomen is soft.  Skin:    General: Skin is warm and dry.  Neurological:     Mental Status: He is alert.  Psychiatric:        Mood and  Affect: Mood normal.      Pertinent Labs, Studies, and Procedures:     Latest Ref Rng & Units 01/05/2022    7:43 AM 01/04/2022   12:28 AM 01/03/2022   12:39 AM  CBC  WBC 4.0 - 10.5 K/uL 4.8  5.4  5.2   Hemoglobin 13.0 - 17.0 g/dL 9.0  8.7  8.4   Hematocrit 39.0 - 52.0 % 27.7  26.0  25.2   Platelets 150 - 400 K/uL 118  110  113       Latest Ref Rng & Units 01/05/2022    7:43 AM 01/04/2022   12:28 AM 01/03/2022   12:39 AM  CMP  Glucose 70 - 99 mg/dL 116  147  124   BUN 6 - 20 mg/dL 76  71  69   Creatinine 0.61 - 1.24 mg/dL 4.82  4.47  4.27   Sodium 135 - 145 mmol/L 134  134  135  Potassium 3.5 - 5.1 mmol/L 3.7  3.8  3.9   Chloride 98 - 111 mmol/L 99  99  101   CO2 22 - 32 mmol/L 24  26  24    Calcium 8.9 - 10.3 mg/dL 8.3  8.2  8.3   Total Protein 6.5 - 8.1 g/dL   5.8   Total Bilirubin 0.3 - 1.2 mg/dL   0.4   Alkaline Phos 38 - 126 U/L   43   AST 15 - 41 U/L   16   ALT 0 - 44 U/L   15    US Abdomen Limited RUQ (LIVER/GB)  Result Date: 01/02/2022 CLINICAL DATA:  History of alcohol viral hepatitis EXAM: ULTRASOUND ABDOMEN LIMITED RIGHT UPPER QUADRANT COMPARISON:  CT 01/18/2020 FINDINGS: Gallbladder: No gallstones or wall thickening visualized. No sonographic Murphy sign noted by sonographer. Common bile duct: Diameter: 3.7 mm Liver: Negative for focal mass. No convincing surface nodularity. Portal vein is patent on color Doppler imaging with normal direction of blood flow towards the liver. Other: Right kidney is possibly echogenic IMPRESSION: 1. Negative for hepatic mass lesion. 2. Possible slightly echogenic right kidney as may be seen with medical renal disease Electronically Signed   By: Donavan Foil M.D.   On: 01/02/2022 18:19     Discharge Instructions: Discharge Instructions     Ambulatory referral to Nephrology   Complete by: As directed    [facility]   Diet - low sodium heart healthy   Complete by: As directed    Discharge instructions   Complete by:  As directed    [name],  It was a pleasure caring for you during her stay here at [place] came in with a heart failure exacerbation and were treated with diuresis with good response.  Stop taking your Lasix. You will leave with a prescription for torsemide 40 mg twice a day.  Please follow-up with our clinic later this week.  You can call the number provided to schedule an appointment. -Stop taking Lasix.  Start taking torsemide. -Stop taking Crestor. Start taking Lipitor. -Follow-up with the [facility] later this week -Please weigh yourself daily and record this to bring to your follow-up -A kidney specialist should be calling you to schedule an appointment       Signed: Linward Natal, MD 01/05/2022, 1:35 PM   Pager: [phone]

## 2022-01-06 ENCOUNTER — Other Ambulatory Visit (HOSPITAL_COMMUNITY): Payer: Self-pay

## 2022-01-06 ENCOUNTER — Other Ambulatory Visit: Payer: Self-pay | Admitting: Student

## 2022-01-06 MED ORDER — PROCHLORPERAZINE MALEATE 5 MG PO TABS
5.0000 mg | ORAL_TABLET | Freq: Three times a day (TID) | ORAL | 0 refills | Status: DC | PRN
  Filled 2022-01-06: qty 30, 10d supply, fill #0

## 2022-01-08 ENCOUNTER — Ambulatory Visit (INDEPENDENT_AMBULATORY_CARE_PROVIDER_SITE_OTHER): Payer: Commercial Managed Care - HMO | Admitting: Student

## 2022-01-08 ENCOUNTER — Other Ambulatory Visit (HOSPITAL_COMMUNITY): Payer: Self-pay

## 2022-01-08 VITALS — BP 162/86 | HR 70 | Wt 212.6 lb

## 2022-01-08 DIAGNOSIS — Z87891 Personal history of nicotine dependence: Secondary | ICD-10-CM

## 2022-01-08 DIAGNOSIS — E11610 Type 2 diabetes mellitus with diabetic neuropathic arthropathy: Secondary | ICD-10-CM

## 2022-01-08 DIAGNOSIS — N189 Chronic kidney disease, unspecified: Secondary | ICD-10-CM

## 2022-01-08 DIAGNOSIS — N179 Acute kidney failure, unspecified: Secondary | ICD-10-CM | POA: Diagnosis not present

## 2022-01-08 DIAGNOSIS — I509 Heart failure, unspecified: Secondary | ICD-10-CM

## 2022-01-08 DIAGNOSIS — E119 Type 2 diabetes mellitus without complications: Secondary | ICD-10-CM

## 2022-01-08 DIAGNOSIS — I5033 Acute on chronic diastolic (congestive) heart failure: Secondary | ICD-10-CM | POA: Diagnosis not present

## 2022-01-08 DIAGNOSIS — I130 Hypertensive heart and chronic kidney disease with heart failure and stage 1 through stage 4 chronic kidney disease, or unspecified chronic kidney disease: Secondary | ICD-10-CM | POA: Diagnosis not present

## 2022-01-08 DIAGNOSIS — I10 Essential (primary) hypertension: Secondary | ICD-10-CM

## 2022-01-08 DIAGNOSIS — E1122 Type 2 diabetes mellitus with diabetic chronic kidney disease: Secondary | ICD-10-CM

## 2022-01-08 DIAGNOSIS — B182 Chronic viral hepatitis C: Secondary | ICD-10-CM

## 2022-01-08 DIAGNOSIS — Z0000 Encounter for general adult medical examination without abnormal findings: Secondary | ICD-10-CM

## 2022-01-08 DIAGNOSIS — Z23 Encounter for immunization: Secondary | ICD-10-CM | POA: Diagnosis not present

## 2022-01-08 LAB — PROTIME-INR
INR: 1.1 (ref 0.8–1.2)
Prothrombin Time: 14.5 seconds (ref 11.4–15.2)

## 2022-01-08 MED ORDER — TORSEMIDE 20 MG PO TABS
60.0000 mg | ORAL_TABLET | Freq: Two times a day (BID) | ORAL | 2 refills | Status: DC
  Filled 2022-01-08 – 2022-01-19 (×2): qty 120, 20d supply, fill #0

## 2022-01-08 NOTE — Assessment & Plan Note (Addendum)
Blood pressure 162/86 today.  He has been continued on Coreg 25 mg twice daily and hydralazine 50 mg 3 times daily.  We think some of his elevation in blood pressure is due to the recent increase in peripheral edema and hope that with the increase in diuresis this will improve.  We will recheck his blood pressure next week and make any changes if needed.  We considered adding losartan 25 mg as well as dapagliflozin 10 mg however his renal function is currently prohibitive. - Continue on hydralazine 50 mg 3 times daily and carvedilol 25 mg twice daily - Return in 1 week to reevaluate

## 2022-01-08 NOTE — Patient Instructions (Signed)
  Thank you, [name], for allowing [place] to provide your care today. Today we discussed . . .  > Heart failure       -Today we discussed your fluid gain since your discharge from the hospital at almost 20 pound gain.  We discussed the need to either increase your medication or add others.  We will increase her torsemide to 60 mg twice daily and plan for you to return to the clinic early next week to reevaluate.  Please continue to take daily weights as you were.  Continue with your current salt restriction.  If you have any worsening swelling or decreased urine output as well as any other worrying symptoms please call the clinic or go to the emergency department. > Acute kidney injury and liver function abnormalities       -We discussed that during her hospitalization for an acute exacerbation of your heart failure the inpatient team noticed your kidney function decline but then stabilized as you were discharged.  They also saw that some indicators of liver function were abnormal but again they solve these normalizing prior to discharge.  We will check labs today to ensure these are continuing to trend in the right direction.  We will call with results and any changes that need to be made will be communicated with you.  We will also send a referral to nephrology to further evaluate this. > Hypertension       -Your blood pressure today in the office was elevated at 174/83.  You are currently on hydralazine and carvedilol.  We will increase the torsemide as above and hope with the fluid that we can get off your blood pressure will improve.  We will reassess this at your visit next week and discuss any further treatment options at that point if needed.   I have ordered the following labs for you:  Lab Orders         CBC no Diff         CMP14 + Anion Gap         Protime-INR        Tests ordered today:  None   Referrals ordered today:   Referral Orders         Ambulatory referral to  Nephrology         Ambulatory referral to Podiatry        I have ordered the following medication/changed the following medications:   Stop the following medications: Medications Discontinued During This Encounter  Medication Reason   torsemide (DEMADEX) 20 MG tablet Reorder     Start the following medications: Meds ordered this encounter  Medications   torsemide (DEMADEX) 20 MG tablet    Sig: Take 3 tablets (60 mg total) by mouth 2 (two) times daily.    Dispense:  120 tablet    Refill:  2      Follow up:  1 week     Remember: Call the office or go to the emergency department if you have signs and symptoms of worsening heart failure including increased leg swelling, shortness of breath, chest pain, or signs of further decreased renal function including decreased urine output.   Should you have any questions or concerns please call the internal medicine clinic at [phone].     [name], [place]

## 2022-01-08 NOTE — Progress Notes (Signed)
   CC: Hospital follow-up after acute exacerbation of heart failure  HPI:  [name] a 56 y.o. male with past medical history as below who presents for hospital follow-up after being discharged in the hospital on 01/05/2022 for acute exacerbation of heart failure with preserved ejection fraction.  Please see problem based charting for history, assessment and plan.  Past Medical History:  Diagnosis Date   Acute exacerbation of CHF (congestive heart failure) ([place]) 12/31/2021   Anxiety    Panic Atacks   Arthritis    Carpal tunnel syndrome    Constipation    Depression    Diabetes mellitus    DMII   Dysrhythmia    hx tachy hr   GERD (gastroesophageal reflux disease)    no problem in past few years.   Hepatitis    Hyperlipidemia    Hypertension    Neuropathy associated with endocrine disorder ([place])    Seizures ([place])    "stress related" no more since taking Klonopin 2 years- See Neurologist  [facility]   Shortness of breath dyspnea    with exertion   Sleep apnea    used a cpap when he was 350lb-does not need one now 260lb   Wears contact lenses    Review of Systems:   Lower extremity edema, intermittent leg cramps No dyspnea, chest pain, cough, paroxysmal nocturnal dyspnea, nausea, vomiting, abdominal pain, dysuria, decreased urine output.  Physical Exam:  Vitals:   01/08/22 1328 01/08/22 1416  BP: (!) 174/83 (!) 162/86  Pulse: 73 70  SpO2: 99%   Weight: 212 lb 9.6 oz (96.4 kg)    Constitutional: Elderly male sitting in chair. In no acute distress. HENT: Normocephalic atraumatic Eyes: Sclera nonicteric, PERRL, EOM intact Neck: No evidence of JVD Cardio:Regular rate and rhythm. No murmurs, rubs, or gallops. Pulm: Largely clear to auscultation bilaterally with faint crackles in the bases. Normal work of breathing on room air. Abdomen: Soft, nontender, no appreciable edema, bowel sounds present MSK: 2+ pitting edema in the bilateral lower extremities below the  knees.  Charcot foot on the right. Skin:Warm and dry.  Large callus buildup on the base of the left medial first toe Neuro:Alert and oriented x3. No focal deficit noted. Psych:Pleasant mood and affect.   Assessment & Plan:   See Encounters Tab for problem based charting.  Patient seen with Dr. Dareen Piano

## 2022-01-08 NOTE — Assessment & Plan Note (Signed)
Patient was recently discharged from the hospital after an acute exacerbation of his heart failure and was discharged on torsemide 40 mg twice daily a change from his prior of Lasix 80 mg twice daily.  Our inpatient team had originally planned to discharge on torsemide 60 mg twice daily however his renal function was worsening during admission and we elected to discharge on a lower dose to avoid further kidney injury.  However he presents today with a 7 pound weight gain since discharge with increased edema in his lower extremities.  He denies any increased dyspnea, chest pain, or cough.  He has had some mild leg cramps.  On exam he has 1-2+ pitting edema right greater than left below the chin.  We will increase his torsemide to 60 mg twice daily and recheck labs.  With a CBC, CMP, PT and INR as well as a urine microalbumin (patient was unable to void at this visit so we will recollect at next visit). - Torsemide 60 mg twice daily - Return to clinic next week

## 2022-01-09 ENCOUNTER — Other Ambulatory Visit (HOSPITAL_COMMUNITY): Payer: Self-pay

## 2022-01-09 DIAGNOSIS — Z0000 Encounter for general adult medical examination without abnormal findings: Secondary | ICD-10-CM | POA: Insufficient documentation

## 2022-01-09 LAB — CMP14 + ANION GAP
ALT: 19 IU/L (ref 0–44)
AST: 25 IU/L (ref 0–40)
Albumin/Globulin Ratio: 1.2 (ref 1.2–2.2)
Albumin: 3.7 g/dL — ABNORMAL LOW (ref 3.8–4.9)
Alkaline Phosphatase: 66 IU/L (ref 44–121)
Anion Gap: 18 mmol/L (ref 10.0–18.0)
BUN/Creatinine Ratio: 23 — ABNORMAL HIGH (ref 9–20)
BUN: 111 mg/dL (ref 6–24)
Bilirubin Total: <0.2 mg/dL (ref 0.0–1.2)
CO2: 22 mmol/L (ref 20–29)
Calcium: 8.3 mg/dL — ABNORMAL LOW (ref 8.7–10.2)
Chloride: 95 mmol/L — ABNORMAL LOW (ref 96–106)
Creatinine, Ser: 4.77 mg/dL — ABNORMAL HIGH (ref 0.76–1.27)
Globulin, Total: 3.2 g/dL (ref 1.5–4.5)
Glucose: 191 mg/dL — ABNORMAL HIGH (ref 70–99)
Potassium: 4.3 mmol/L (ref 3.5–5.2)
Sodium: 135 mmol/L (ref 134–144)
Total Protein: 6.9 g/dL (ref 6.0–8.5)
eGFR: 14 mL/min/{1.73_m2} — ABNORMAL LOW (ref >59)

## 2022-01-09 LAB — CBC
Hematocrit: 28.6 % — ABNORMAL LOW (ref 37.5–51.0)
Hemoglobin: 9.5 g/dL — ABNORMAL LOW (ref 13.0–17.7)
MCH: 28.5 pg (ref 26.6–33.0)
MCHC: 33.2 g/dL (ref 31.5–35.7)
MCV: 86 fL (ref 79–97)
Platelets: 131 10*3/uL — ABNORMAL LOW (ref 150–450)
RBC: 3.33 x10E6/uL — ABNORMAL LOW (ref 4.14–5.80)
RDW: 13.2 % (ref 11.6–15.4)
WBC: 6.9 10*3/uL (ref 3.4–10.8)

## 2022-01-09 NOTE — Assessment & Plan Note (Signed)
Patient with normal renal function in 2022 now with evidence of chronic kidney disease likely stage III.  However this is slightly difficult to determine due to acute kidney injury at the moment.  We will get a CMP today to continue to monitor his renal function and refer him to nephrology.

## 2022-01-09 NOTE — Assessment & Plan Note (Addendum)
Patient had elevated PT/INR and low platelets during recent admission with INR up to 2.2 but trending down upon discharge.  PT/INR check today within normal limits.  Platelets are still low but rising at 130 K.

## 2022-01-09 NOTE — Assessment & Plan Note (Addendum)
Patient appears to be on any diabetic medication with his A1c at 6.9% last month.  Prior notes have stated he was on Victoza.  He is closely managing his diet and watching his weight.  His free evaluate and treat his lower extremity edema we will monitor this and treat if indicated. - Unable to get urine microalbumin at this visit and will recollect at next visit next week.

## 2022-01-09 NOTE — Progress Notes (Signed)
Call the patient and discussed the results of his recent CMP, CBC, and PT/INR.  Discussed in particular that his INR and PT are back in therapeutic range and his platelets are trending up close to normal.  We also discussed his stable creatinine but increasing BUN.  During our visit when the labs were drawn he did not have any symptoms of uremia and when asked again on this call he denied any symptoms of uremia as well.  He was advised on symptoms to watch for and given strict return precautions.  All questions were answered.

## 2022-01-09 NOTE — Progress Notes (Signed)
Internal Medicine Clinic Attending  I saw and evaluated the patient.  I personally confirmed the key portions of the history and exam documented by Dr. Goodwin and I reviewed pertinent patient test results.  The assessment, diagnosis, and plan were formulated together and I agree with the documentation in the resident's note.  

## 2022-01-09 NOTE — Assessment & Plan Note (Signed)
Patient was stable Charcot foot on the right and has orthotic inserts for his shoes.  He also has a callus buildup on his left foot on the medial first digit.  He plans to have this removed by podiatry. - Refer to podiatry

## 2022-01-19 ENCOUNTER — Other Ambulatory Visit: Payer: Self-pay | Admitting: Internal Medicine

## 2022-01-19 ENCOUNTER — Other Ambulatory Visit (HOSPITAL_COMMUNITY): Payer: Self-pay

## 2022-01-20 ENCOUNTER — Ambulatory Visit (INDEPENDENT_AMBULATORY_CARE_PROVIDER_SITE_OTHER): Payer: Commercial Managed Care - HMO | Admitting: Student

## 2022-01-20 ENCOUNTER — Other Ambulatory Visit (HOSPITAL_COMMUNITY): Payer: Self-pay

## 2022-01-20 VITALS — BP 164/92 | HR 74 | Temp 98.2°F | Ht 70.0 in | Wt 216.7 lb

## 2022-01-20 DIAGNOSIS — N189 Chronic kidney disease, unspecified: Secondary | ICD-10-CM

## 2022-01-20 DIAGNOSIS — Z87891 Personal history of nicotine dependence: Secondary | ICD-10-CM

## 2022-01-20 DIAGNOSIS — I10 Essential (primary) hypertension: Secondary | ICD-10-CM

## 2022-01-20 DIAGNOSIS — E1122 Type 2 diabetes mellitus with diabetic chronic kidney disease: Secondary | ICD-10-CM

## 2022-01-20 DIAGNOSIS — I130 Hypertensive heart and chronic kidney disease with heart failure and stage 1 through stage 4 chronic kidney disease, or unspecified chronic kidney disease: Secondary | ICD-10-CM | POA: Diagnosis not present

## 2022-01-20 DIAGNOSIS — I5033 Acute on chronic diastolic (congestive) heart failure: Secondary | ICD-10-CM | POA: Diagnosis not present

## 2022-01-20 DIAGNOSIS — E119 Type 2 diabetes mellitus without complications: Secondary | ICD-10-CM

## 2022-01-20 DIAGNOSIS — F32A Depression, unspecified: Secondary | ICD-10-CM

## 2022-01-20 DIAGNOSIS — N179 Acute kidney failure, unspecified: Secondary | ICD-10-CM | POA: Diagnosis not present

## 2022-01-20 MED ORDER — TORSEMIDE 20 MG PO TABS
80.0000 mg | ORAL_TABLET | Freq: Two times a day (BID) | ORAL | 3 refills | Status: DC
  Filled 2022-01-20 (×2): qty 240, 30d supply, fill #0

## 2022-01-20 NOTE — Patient Instructions (Signed)
  Thank you, Mr.Jaideep E Yielding, for allowing [place] to provide your care today. Today we discussed . . .  > Heart failure       -Unfortunately we do not get the desired effect from increasing torsemide to 60 mg twice daily.  We will again increase this to 80 mg twice daily.  If you have any worsening edema, decreased urine output, increased shortness of breath, or other concerning symptoms please do not hesitate to give our office a call. > Kidney injury       -We have referred you to nephrology and will check on the referral to make sure that they contact you this week.  We have drawn labs today to monitor your renal function and we will call you with results.   I have ordered the following labs for you:   Lab Orders         Microalbumin / Creatinine Urine Ratio         BMP8+Anion Gap       Tests ordered today:  None   Referrals ordered today:   Referral Orders  No referral(s) requested today      I have ordered the following medication/changed the following medications:   Stop the following medications: Medications Discontinued During This Encounter  Medication Reason   torsemide (DEMADEX) 20 MG tablet Dose change     Start the following medications: Meds ordered this encounter  Medications   torsemide (DEMADEX) 20 MG tablet    Sig: Take 4 tablets (80 mg total) by mouth 2 (two) times daily.    Dispense:  240 tablet    Refill:  3      Follow up:  2 Weeks     Remember:     Should you have any questions or concerns please call the internal medicine clinic at [phone].     Johny Blamer, [place]

## 2022-01-20 NOTE — Assessment & Plan Note (Signed)
BMP at last visit showed stable creatinine of 4.7 with GFR of 14.  We will repeat this today and he has a nephrology referral that we are checking on.  He denies any change in his urine output and notes that it is similar to as much as it was when he was being aggressively diuresed in the hospital. - Follow-up BMP today and continue with nephrology referral

## 2022-01-20 NOTE — Assessment & Plan Note (Signed)
PHQ-9 score today of 13 without thoughts of self-harm or passive death wish.  Unfortunately this was not discussed with the patient during his visit.  Based on prior notes he has been on sertraline in the past but had to stop due to losing health insurance. - Discuss restarting SSRI with the patient at his next visit

## 2022-01-20 NOTE — Progress Notes (Signed)
   CC: Follow-up heart failure, hypertension, and AKI  HPI:  [name] a 56 y.o. male with history as below who presents for follow-up visit.  Please see encounters tab for problem based charting.  Past Medical History:  Diagnosis Date   Acute exacerbation of CHF (congestive heart failure) ([place]) 12/31/2021   Anxiety    Panic Atacks   Arthritis    Carpal tunnel syndrome    Constipation    Depression    Diabetes mellitus    DMII   Dysrhythmia    hx tachy hr   GERD (gastroesophageal reflux disease)    no problem in past few years.   Hepatitis    Hyperlipidemia    Hypertension    Neuropathy associated with endocrine disorder ([place])    Seizures ([place])    "stress related" no more since taking Klonopin 2 years- See Neurologist  [facility]   Shortness of breath dyspnea    with exertion   Sleep apnea    used a cpap when he was 350lb-does not need one now 260lb   Wears contact lenses    Review of Systems:   A comprehensive review of systems was negative except for: Cardiovascular: positive for lower extremity edema Musculoskeletal: positive for back pain   Physical Exam:  Vitals:   01/20/22 0947 01/20/22 1024  BP: (!) 176/88 (!) 164/92  Pulse: 77 74  Temp: 98.2 F (36.8 C)   TempSrc: Oral   SpO2: 100%   Weight: 216 lb 11.2 oz (98.3 kg)   Height: 5\' 10"  (1.778 m)    Constitutional: Middle-age male sitting in his chair. In no acute distress. HENT: Normocephalic, atraumatic,  Eyes: Sclera non-icteric Cardio:Regular rate and rhythm. No murmurs, rubs, or gallops. 2+ bilateral radial pulses. Pulm:Clear to auscultation bilaterally. Normal work of breathing on room air. Abdomen: Soft, non-tender, non-distended MSK: 1-2+ pitting edema in bilateral lower extremities to the mid shin.  No evidence of skin breakdown or ulcers. Skin:Warm and dry. Neuro:Alert and oriented x3. No focal deficit noted. Psych:Pleasant mood and affect.   Assessment & Plan:   See  Encounters Tab for problem based charting.  Patient seen with Dr. Evette Doffing

## 2022-01-20 NOTE — Assessment & Plan Note (Signed)
Patient comes in 1 week after hospital follow-up.  At prior visit he was roughly 7 pounds up after discharge and today he is an additional 4 pounds up.  He has slightly increased lower extremity edema but notes improving dyspnea.  On exam his lungs are clear but he does have slightly increased lower extremity edema.  Weight today 216 in office today, 212 one week ago, and 199 when discharged from hospital.  At his last visit we increased his torsemide from 40 mg twice daily to 60 mg twice daily.  BMP during that visit showed stable renal function although his GFR remains at 14.  We are still limited to primarily loop diuretics at this time.  We will increase his torsemide to 80 mg twice daily.  He has a nephrology referral that we will check on today. - Increase torsemide to 80 mg twice daily - Return in 2 weeks for reevaluation

## 2022-01-20 NOTE — Assessment & Plan Note (Signed)
Blood pressure elevated today at 176/88 with repeat of 164/92.  He had just recently taken his hydralazine and carvedilol.  He is keeping a log of his blood pressures at home and taking them once before his medications and then once or before he goes to bed.  His blood pressure at home has been elevated with systolics of 747-159 and diastolics just above 539 with response after taking his medication but systolics are consistently above 140 with majority of time and diastolics hover between 85 and 95.  He denies any symptoms from his high blood pressure but does note that when his blood pressure is around 130/80 he can feel a little less energetic.  Denies any presyncope or syncope.  Considered adding additional diuretic for blood pressure control however his GFR of 14 is prohibitive at this time.  We will continue to work on diuresis with increasing his loop diuretic and working on expediting his nephrology referral. - Continue carvedilol 25 mg twice daily and hydralazine 50 mg 3 times daily - Increase torsemide to 80 mg twice daily - Return in 2 weeks for reevaluation

## 2022-01-21 ENCOUNTER — Other Ambulatory Visit (HOSPITAL_COMMUNITY): Payer: Self-pay

## 2022-01-21 LAB — BMP8+ANION GAP
Anion Gap: 16 mmol/L (ref 10.0–18.0)
BUN/Creatinine Ratio: 22 — ABNORMAL HIGH (ref 9–20)
BUN: 93 mg/dL (ref 6–24)
CO2: 20 mmol/L (ref 20–29)
Calcium: 8.5 mg/dL — ABNORMAL LOW (ref 8.7–10.2)
Chloride: 97 mmol/L (ref 96–106)
Creatinine, Ser: 4.32 mg/dL — ABNORMAL HIGH (ref 0.76–1.27)
Glucose: 396 mg/dL — ABNORMAL HIGH (ref 70–99)
Potassium: 4.9 mmol/L (ref 3.5–5.2)
Sodium: 133 mmol/L — ABNORMAL LOW (ref 134–144)
eGFR: 15 mL/min/{1.73_m2} — ABNORMAL LOW (ref >59)

## 2022-01-21 NOTE — Progress Notes (Signed)
Patient called and updated about the results of his BMP. Renal function and BUN slightly improved. Glucose is nearly 400. Last A1c in 12/18/2021 was 6.9. Plan to recheck A1c at follow up. All questions were answered.

## 2022-01-21 NOTE — Progress Notes (Signed)
Internal Medicine Clinic Attending  I saw and evaluated the patient.  I personally confirmed the key portions of the history and exam documented by Dr. Goodwin and I reviewed pertinent patient test results.  The assessment, diagnosis, and plan were formulated together and I agree with the documentation in the resident's note.  

## 2022-01-23 ENCOUNTER — Other Ambulatory Visit (HOSPITAL_COMMUNITY): Payer: Self-pay

## 2022-01-23 LAB — MICROALBUMIN / CREATININE URINE RATIO
Creatinine, Urine: 30 mg/dL
Microalb/Creat Ratio: 5352 mg/g creat — ABNORMAL HIGH (ref 0–29)
Microalbumin, Urine: 1605.6 ug/mL

## 2022-01-25 ENCOUNTER — Other Ambulatory Visit: Payer: Self-pay | Admitting: Internal Medicine

## 2022-01-28 ENCOUNTER — Other Ambulatory Visit (HOSPITAL_COMMUNITY): Payer: Self-pay

## 2022-01-28 MED ORDER — PROCHLORPERAZINE MALEATE 5 MG PO TABS
5.0000 mg | ORAL_TABLET | Freq: Three times a day (TID) | ORAL | 0 refills | Status: DC | PRN
  Filled 2022-01-28: qty 30, 10d supply, fill #0

## 2022-01-28 NOTE — Progress Notes (Signed)
Called the patient and discussed the results of his microalbumin to creatinine ratio.  He had already looked at the results on MyChart.  Discussed that although it is high it is going in the right direction.  No change in therapy at this point.  Continue with scheduled follow-up on 02/03/2022.  All questions were answered.

## 2022-01-29 ENCOUNTER — Ambulatory Visit: Payer: Commercial Managed Care - HMO | Admitting: Podiatry

## 2022-01-29 ENCOUNTER — Other Ambulatory Visit (HOSPITAL_COMMUNITY): Payer: Self-pay

## 2022-02-02 ENCOUNTER — Other Ambulatory Visit: Payer: Self-pay

## 2022-02-02 MED ORDER — HYDRALAZINE HCL 50 MG PO TABS: 50.0000 mg | ORAL_TABLET | Freq: Three times a day (TID) | ORAL | 5 refills | Status: DC

## 2022-02-02 NOTE — Telephone Encounter (Signed)
Pt has an appt schedule with Dr Allyson Sabal tomorrow.

## 2022-02-02 NOTE — Telephone Encounter (Signed)
Pt state the pharmacy is waiting for reply on hydrALAZINE (APRESOLINE) 50 MG tablet. Pt would like this Rx to be sent to [facility] on [address]. Please call pt back.

## 2022-02-03 ENCOUNTER — Encounter: Payer: Self-pay | Admitting: Student

## 2022-02-03 ENCOUNTER — Ambulatory Visit (INDEPENDENT_AMBULATORY_CARE_PROVIDER_SITE_OTHER): Payer: Commercial Managed Care - HMO | Admitting: Student

## 2022-02-03 VITALS — BP 189/90 | HR 69 | Temp 97.7°F | Ht 70.0 in | Wt 204.4 lb

## 2022-02-03 DIAGNOSIS — E785 Hyperlipidemia, unspecified: Secondary | ICD-10-CM | POA: Diagnosis not present

## 2022-02-03 DIAGNOSIS — I130 Hypertensive heart and chronic kidney disease with heart failure and stage 1 through stage 4 chronic kidney disease, or unspecified chronic kidney disease: Secondary | ICD-10-CM

## 2022-02-03 DIAGNOSIS — E119 Type 2 diabetes mellitus without complications: Secondary | ICD-10-CM

## 2022-02-03 DIAGNOSIS — Z87891 Personal history of nicotine dependence: Secondary | ICD-10-CM

## 2022-02-03 DIAGNOSIS — I10 Essential (primary) hypertension: Secondary | ICD-10-CM

## 2022-02-03 DIAGNOSIS — E1122 Type 2 diabetes mellitus with diabetic chronic kidney disease: Secondary | ICD-10-CM

## 2022-02-03 DIAGNOSIS — N179 Acute kidney failure, unspecified: Secondary | ICD-10-CM | POA: Diagnosis not present

## 2022-02-03 DIAGNOSIS — I5033 Acute on chronic diastolic (congestive) heart failure: Secondary | ICD-10-CM

## 2022-02-03 DIAGNOSIS — D696 Thrombocytopenia, unspecified: Secondary | ICD-10-CM

## 2022-02-03 DIAGNOSIS — K7689 Other specified diseases of liver: Secondary | ICD-10-CM

## 2022-02-03 DIAGNOSIS — N189 Chronic kidney disease, unspecified: Secondary | ICD-10-CM | POA: Diagnosis not present

## 2022-02-03 LAB — BASIC METABOLIC PANEL
Anion gap: 15 (ref 5–15)
BUN: 84 mg/dL — ABNORMAL HIGH (ref 6–20)
CO2: 23 mmol/L (ref 22–32)
Calcium: 8.8 mg/dL — ABNORMAL LOW (ref 8.9–10.3)
Chloride: 92 mmol/L — ABNORMAL LOW (ref 98–111)
Creatinine, Ser: 4.56 mg/dL — ABNORMAL HIGH (ref 0.61–1.24)
GFR, Estimated: 14 mL/min — ABNORMAL LOW (ref >60)
Glucose, Bld: 429 mg/dL — ABNORMAL HIGH (ref 70–99)
Potassium: 3.7 mmol/L (ref 3.5–5.1)
Sodium: 130 mmol/L — ABNORMAL LOW (ref 135–145)

## 2022-02-03 MED ORDER — ATORVASTATIN CALCIUM 40 MG PO TABS: 40.0000 mg | ORAL_TABLET | Freq: Every day | ORAL | 2 refills | Status: DC

## 2022-02-03 NOTE — Assessment & Plan Note (Signed)
A1c 6.9 September.  He is doing well controlling his diabetes with diet.  -Follow-up with podiatry for foot callus

## 2022-02-03 NOTE — Assessment & Plan Note (Signed)
Blood pressure elevated today 189/90.  He ran out of his Coreg and hydralazine for 2 days.  He thought his medications was at [facility] but actually at [facility] on [address].   -Advised patient to pick up his Coreg and hydralazine -Patient could not tolerate amlodipine due to bilateral lower extremity swelling.

## 2022-02-03 NOTE — Assessment & Plan Note (Addendum)
Patient was noted to have hypoalbuminemia 2.3, thrombocytopenia 113, normocytic anemia 8.4 with elevated INR of 2.2 from last hospitalization.  Right upper quadrant [place] did not show any nodularity appearance.  Portal vein was patent.  Patient has remote hepatitis C infection which was eradicated.  Confirmed cure 2 months ago.  He also had remote history of alcohol use disorder which he quit 7 years ago.  Fib 4 score calculated 2.5 which put him at intermediate risk that needs further evaluation.  With his chronic hep C and alcohol use disorder, will obtain elastography to rule out hepatic cirrhosis.

## 2022-02-03 NOTE — Assessment & Plan Note (Signed)
-  Please see acute on chronic HFpEF problem above

## 2022-02-03 NOTE — Patient Instructions (Addendum)
[name],  It was nice seeing you in the clinic today.  Here is a summary what we talked about:  1.  I am glad you are doing well on the current dose of torsemide 80 mg twice daily.  Please keep this current dose.   2.  I will check your kidney function today.  Our staff will reach out to [facility] for your follow-up appointment.    3.  I ordered a ultrasound of your liver to look for any scarring.  They will call you for an appointment.  Please return in 2 weeks  Take care  Dr. Alfonse Spruce

## 2022-02-03 NOTE — Assessment & Plan Note (Addendum)
Patient is here for 2 weeks follow-up.  He was taking torsemide 80 mg twice daily with improvement of his urine output.  Said that his lower extremity edema has improved as well as his shortness of breath and exercise tolerance.  His weight has trended down to 204 pounds today from 216.  On exam, he has +1 pitting edema up to bilateral calf.  He was wearing compression stocking.  Lungs are clear bilaterally.  No JVD appreciated.  Overall he is very close to his dry weight.  We are also watching his kidney function closely.  His last creatinine was 4.3, BUN 93, K4.9 and GFR 15.  Patient report mild symptoms of uremia including change in taste, brain fogginess and pruritus.  No indication for urgent dialysis today but he is heading towards ESRD.  We called [facility] and made an appointment for him on December 4.  In the meantime he will need close follow-up at the [facility].    -Continue torsemide 80 mg twice daily -Continue Coreg and hydralazine for blood pressure control -Cannot add an SGLT2 with his kidney function. -Recheck BMP today -Follow-up with [facility] in 2 weeks -Follow-up with [facility] in December

## 2022-02-03 NOTE — Progress Notes (Signed)
CC: Follow-up on HFpEF exacerbation and CKD  HPI:  [name] is a 56 y.o. with past medical history of hypertension, type 2 diabetes, HFpEF, who presents to the clinic for 2 weeks follow-up for his HFpEF exacerbation.  Please see problem based charting for detail  Past Medical History:  Diagnosis Date   Acute exacerbation of CHF (congestive heart failure) ([place]) 12/31/2021   Anxiety    Panic Atacks   Arthritis    Carpal tunnel syndrome    Constipation    Depression    Diabetes mellitus    DMII   Dysrhythmia    hx tachy hr   GERD (gastroesophageal reflux disease)    no problem in past few years.   Hepatitis    Hyperlipidemia    Hypertension    Neuropathy associated with endocrine disorder ([place])    Seizures ([place])    "stress related" no more since taking Klonopin 2 years- See Neurologist  [facility]   Shortness of breath dyspnea    with exertion   Sleep apnea    used a cpap when he was 350lb-does not need one now 260lb   Wears contact lenses    Review of Systems:  per HPI  Physical Exam:  Vitals:   02/03/22 0950  BP: (!) 189/90  Pulse: 69  Temp: 97.7 F (36.5 C)  TempSrc: Oral  SpO2: 100%  Weight: 204 lb 6.4 oz (92.7 kg)  Height: 5\' 10"  (1.778 m)   Physical Exam Constitutional:      General: He is not in acute distress.    Appearance: He is not ill-appearing.  HENT:     Head: Normocephalic.  Eyes:     General:        Right eye: No discharge.        Left eye: No discharge.     Conjunctiva/sclera: Conjunctivae normal.  Cardiovascular:     Rate and Rhythm: Normal rate and regular rhythm.     Comments: +1 LE edema bilaterally up to mid calf.  No JVD Pulmonary:     Effort: Pulmonary effort is normal. No respiratory distress.     Breath sounds: Normal breath sounds. No wheezing.  Abdominal:     General: Bowel sounds are normal. There is no distension.     Palpations: Abdomen is soft.  Musculoskeletal:        General: Normal range of motion.      Cervical back: Normal range of motion.  Skin:    General: Skin is warm.     Comments: Large callus lateral to left big toe.  Neurological:     Mental Status: He is alert.  Psychiatric:        Mood and Affect: Mood normal.      Assessment & Plan:   See Encounters Tab for problem based charting.  Acute on chronic heart failure with preserved ejection fraction [facility]) Patient is here for 2 weeks follow-up.  He was taking torsemide 80 mg twice daily with improvement of his urine output.  Said that his lower extremity edema has improved as well as his shortness of breath and exercise tolerance.  His weight has trended down to 204 pounds today from 216.  On exam, he has +1 pitting edema up to bilateral calf.  He was wearing compression stocking.  Lungs are clear bilaterally.  No JVD appreciated.  Overall he is very close to his dry weight.  We are also watching his kidney function closely.  His last creatinine  was 4.3, BUN 93, K4.9 and GFR 15.  Patient report mild symptoms of uremia including change in taste, brain fogginess and pruritus.  No indication for urgent dialysis today but he is heading towards ESRD.  We called [facility] and made an appointment for him on December 4.  In the meantime he will need close follow-up at the [facility].    -Continue torsemide 80 mg twice daily -Continue Coreg and hydralazine for blood pressure control -Cannot add an SGLT2 with his kidney function. -Recheck BMP today -Follow-up with [facility] in 2 weeks -Follow-up with [place] [facility] in December  Essential hypertension Blood pressure elevated today 189/90.  He ran out of his Coreg and hydralazine for 2 days.  He thought his medications was at [facility] but actually at [facility] on [address].   -Advised patient to pick up his Coreg and hydralazine -Patient could not tolerate amlodipine due to bilateral lower extremity swelling.  Acute-on-chronic kidney injury ([place]) -Please see acute  on chronic HFpEF problem above  Type 2 diabetes mellitus ([place]) A1c 6.9 September.  He is doing well controlling his diabetes with diet.  -Follow-up with podiatry for foot callus  Liver dysfunction Patient was noted to have hypoalbuminemia 2.3, thrombocytopenia 113, normocytic anemia 8.4 with elevated INR of 2.2 from last hospitalization.  Right upper quadrant [place] did not show any nodularity appearance.  Portal vein was patent.  Patient has remote hepatitis C infection which was eradicated.  Confirmed cure 2 months ago.  He also had remote history of alcohol use disorder which he quit 7 years ago.  Fib 4 score calculated 2.5 which put him at intermediate risk that needs further evaluation.  With his chronic hep C and alcohol use disorder, will obtain elastography to rule out hepatic cirrhosis.   Patient discussed with Dr. Dareen Piano

## 2022-02-04 NOTE — Progress Notes (Signed)
Internal Medicine Clinic Attending  Case discussed with Dr. Nguyen  At the time of the visit.  We reviewed the resident's history and exam and pertinent patient test results.  I agree with the assessment, diagnosis, and plan of care documented in the resident's note. 

## 2022-02-10 ENCOUNTER — Other Ambulatory Visit (HOSPITAL_COMMUNITY): Payer: Self-pay

## 2022-02-10 ENCOUNTER — Other Ambulatory Visit: Payer: Self-pay

## 2022-02-10 MED ORDER — CARVEDILOL 25 MG PO TABS: 25.0000 mg | ORAL_TABLET | Freq: Two times a day (BID) | ORAL | 5 refills | Status: DC

## 2022-02-10 NOTE — Telephone Encounter (Signed)
Patient would like to have all of his prescriptions sent to the [facility] on [address].Marland Kitchen

## 2022-02-10 NOTE — Telephone Encounter (Signed)
  carvedilol (COREG) 25 MG tablet, REFILL REQUEST @ [id number] - [place], [place] - [address].

## 2022-02-11 ENCOUNTER — Other Ambulatory Visit (HOSPITAL_COMMUNITY): Payer: Self-pay

## 2022-02-17 ENCOUNTER — Encounter: Payer: Self-pay | Admitting: Student

## 2022-02-17 ENCOUNTER — Other Ambulatory Visit: Payer: Self-pay

## 2022-02-17 ENCOUNTER — Ambulatory Visit (INDEPENDENT_AMBULATORY_CARE_PROVIDER_SITE_OTHER): Payer: Commercial Managed Care - HMO | Admitting: Student

## 2022-02-17 VITALS — BP 141/67 | HR 70 | Temp 98.1°F | Ht 70.0 in | Wt 204.9 lb

## 2022-02-17 DIAGNOSIS — E1122 Type 2 diabetes mellitus with diabetic chronic kidney disease: Secondary | ICD-10-CM

## 2022-02-17 DIAGNOSIS — N189 Chronic kidney disease, unspecified: Secondary | ICD-10-CM

## 2022-02-17 DIAGNOSIS — I5033 Acute on chronic diastolic (congestive) heart failure: Secondary | ICD-10-CM

## 2022-02-17 DIAGNOSIS — E119 Type 2 diabetes mellitus without complications: Secondary | ICD-10-CM

## 2022-02-17 DIAGNOSIS — N179 Acute kidney failure, unspecified: Secondary | ICD-10-CM

## 2022-02-17 DIAGNOSIS — I130 Hypertensive heart and chronic kidney disease with heart failure and stage 1 through stage 4 chronic kidney disease, or unspecified chronic kidney disease: Secondary | ICD-10-CM

## 2022-02-17 DIAGNOSIS — I10 Essential (primary) hypertension: Secondary | ICD-10-CM

## 2022-02-17 LAB — BASIC METABOLIC PANEL
Anion gap: 14 (ref 5–15)
BUN: 89 mg/dL — ABNORMAL HIGH (ref 6–20)
CO2: 21 mmol/L — ABNORMAL LOW (ref 22–32)
Calcium: 8.1 mg/dL — ABNORMAL LOW (ref 8.9–10.3)
Chloride: 91 mmol/L — ABNORMAL LOW (ref 98–111)
Creatinine, Ser: 4.72 mg/dL — ABNORMAL HIGH (ref 0.61–1.24)
GFR, Estimated: 14 mL/min — ABNORMAL LOW (ref >60)
Glucose, Bld: 685 mg/dL (ref 70–99)
Potassium: 4 mmol/L (ref 3.5–5.1)
Sodium: 126 mmol/L — ABNORMAL LOW (ref 135–145)

## 2022-02-17 MED ORDER — BASAGLAR KWIKPEN 100 UNIT/ML ~~LOC~~ SOPN: 15.0000 [IU] | PEN_INJECTOR | Freq: Every day | SUBCUTANEOUS | 1 refills | Status: DC

## 2022-02-17 MED ORDER — PEN NEEDLES 32G X 4 MM MISC: 1.0000 | Freq: Every day | 1 refills | Status: AC

## 2022-02-17 NOTE — Assessment & Plan Note (Addendum)
Patient is here for 2-week follow-up. Patient has been taking his medications consistently with no issues. He denies difficulty breathing or shortness of breath. He endorses exertional fatigue. He reports that his bilateral leg edema is improving and explains that he has been urinating frequently. Patient reports dry-heaving in the mornings, which he says he has been experiencing since 2020. His weight has remained neutral at 204 pounds since last visit on 02/03/22. On exam, he has +1 pitting edema bilaterally up to his calves. JVD observed up to ear.  Kidney function labs today showed Cr 4.72, BUN 89, and GFR 14 up from Cr 4.56 and BUN 89 two weeks ago. Patent reports occasional brain fogginess.   A: Patient's HF exacerbation is improving as he continues to undergo diuresis. His kidney function is declining with mild symptoms of uremia including brain fogginess. No indication for urgent dialysis today.   Plan -Continue torsemide 80 mg BID -Continue Coreg and hydralazine -Nephrology appointment on 12/4 for further CKD management -Follow-up in clinic after his nephrology appointment

## 2022-02-17 NOTE — Progress Notes (Signed)
Subjective:   Patient ID: [name] male   DOB: Aug 27, 1965 56 y.o.   MRN: [id number]  HPI: [name] is a 56 y.o. man with medical history as listed below who presents for 2-week follow-up of exacerbation of acute on chronic heart failure with preserved ejection fraction. Please see problem-based assessment and plan charting for further details.  Review of Systems: Pertinent items are noted in HPI of problem-based charting.  Past Medical History:  Diagnosis Date   Acute exacerbation of CHF (congestive heart failure) ([place]) 12/31/2021   Anxiety    Panic Atacks   Arthritis    Carpal tunnel syndrome    Constipation    Depression    Diabetes mellitus    DMII   Dysrhythmia    hx tachy hr   GERD (gastroesophageal reflux disease)    no problem in past few years.   Hepatitis    Hyperlipidemia    Hypertension    Neuropathy associated with endocrine disorder ([place])    Seizures ([place])    "stress related" no more since taking Klonopin 2 years- See Neurologist  [facility]   Shortness of breath dyspnea    with exertion   Sleep apnea    used a cpap when he was 350lb-does not need one now 260lb   Wears contact lenses     Patient Active Problem List   Diagnosis Date Noted   Liver dysfunction 02/03/2022   Healthcare maintenance 01/09/2022   OSA (obstructive sleep apnea) 01/01/2022   Acute-on-chronic kidney injury ([place]) 12/31/2021   Acute on chronic heart failure with preserved ejection fraction ([place]) 12/23/2021   Constipation 10/10/2021   Tachycardia 10/02/2021   Unintentional weight loss 09/28/2021   Charcot foot due to diabetes mellitus ([place]) 09/05/2020   Abdominal pain 12/28/2019   Depression    Diabetic neuropathy ([employer]) 09/19/2019   Hyperlipidemia 05/10/2019   Chronic hepatitis C ([place]) 05/10/2019   Abnormal CT scan of lung 05/10/2019   Type 2 diabetes mellitus ([place]) 01/29/2016   Essential hypertension 01/29/2016   Lumbar facet arthropathy 08/29/2014      Current Outpatient Medications  Medication Sig Dispense Refill   atorvastatin (LIPITOR) 40 MG tablet Take 1 tablet (40 mg total) by mouth daily. 30 tablet 2   carvedilol (COREG) 25 MG tablet Take 1 tablet (25 mg total) by mouth 2 (two) times daily with a meal. 60 tablet 5   feeding supplement (BOOST HIGH PROTEIN) LIQD Take 1 Container by mouth daily.     hydrALAZINE (APRESOLINE) 50 MG tablet Take 1 tablet (50 mg total) by mouth 3 (three) times daily. 90 tablet 5   prochlorperazine (COMPAZINE) 5 MG tablet Take 1 tablet (5 mg total) by mouth every 8 (eight) hours as needed for nausea or vomiting. 30 tablet 0   torsemide (DEMADEX) 20 MG tablet Take 4 tablets (80 mg total) by mouth 2 (two) times daily. 240 tablet 3   No current facility-administered medications for this visit.     Objective:   Physical Exam: Vitals:   02/17/22 0944  BP: (!) 141/67  Pulse: 70  Temp: 98.1 F (36.7 C)  TempSrc: Oral  SpO2: 99%  Weight: 204 lb 14.4 oz (92.9 kg)  Height: 5\' 10"  (1.778 m)    Constitutional: well-appearing, in no acute distress HENT: mucous membranes moist. JVD noted up to ear Cardiovascular: regular rate with normal rhythm, no murmurs Pulmonary/Chest: normal work of breathing on room air, lungs clear to auscultation bilaterally Abdominal:  soft, non-tender, non-distended, bowel sounds present MSK: 1+ lower extremity edema bilaterally up to mid-calf Skin: warm and dry. Neurological: alert and answering questions appropriately. Psych: appropriate mood and affect   Assessment & Plan:   Acute on chronic heart failure with preserved ejection fraction [facility]) Patient is here for 2-week follow-up. Patient has been taking his medications consistently with no issues. He denies difficulty breathing or shortness of breath. He endorses exertional fatigue. He reports that his bilateral leg edema is improving and explains that he has been urinating frequently. Patient reports dry-heaving in the  mornings, which he says he has been experiencing since 2020. His weight has remained neutral at 204 pounds since last visit on 02/03/22. On exam, he has +1 pitting edema bilaterally up to his calves. JVD observed up to ear.  Kidney function labs today showed Cr 4.72, BUN 89, and GFR 14 up from Cr 4.56 and BUN 89 two weeks ago. Patent reports occasional brain fogginess.   A: Patient's HF exacerbation is improving as he continues to undergo diuresis. His kidney function is declining with mild symptoms of uremia including brain fogginess. No indication for urgent dialysis today.   Plan -Continue torsemide 80 mg BID -Continue Coreg and hydralazine -Nephrology appointment on 12/4 for further CKD management -Follow-up in clinic after his nephrology appointment  Essential hypertension BP today at 141/67. Not at goal, but improved from BP two weeks ago, which was at 189/90.  Plan -Continue Coreg 25 mg BID and hydralazine 50 mg TID  Type 2 diabetes mellitus ([place]) Patient's A1c 6.9 in 09/23. Glucose BMP at 429 on 10/31.  Joseph Pries (Max) [place], MS3 02/17/2022

## 2022-02-17 NOTE — Assessment & Plan Note (Addendum)
Patient's A1c 6.9 in 09/23. Glucose BMP at 429 on 10/31.  ADDENDUM: Patient's BMP resulted with glucose significantly elevated at 685 (asymptomatic) with mild acidosis. He has been on long-acting insulin in the past and knows how to inject insulin to himself. We discussed initiating a long-acting insulin at this time to improve glycemic control. He is in agreement. He already has a device to check his sugar levels along with lancets and test strips. Sent prescription for basaglar 15u qhs (weight-based dosing; basaglar better covered by insurance) and pen needles to preferred pharmacy. He will call [facility] in 2-3 days with fasting glucose levels over the next few days.   -start basaglar 15u qhs -checking fasting sugar levels and call [facility] with results in 2-3 days

## 2022-02-17 NOTE — Patient Instructions (Addendum)
[name],  It was great to see you in clinic today.  Below is what we discussed today:  For your heart failure exacerbation - We are happy to see that the swelling has improved. Keep with your current regimen and we will check-in after your appointment with the nephrologist.  For your blood sugars - keep a track of your fasting sugars until your next appointment, and we will re-evaluate about whether to start insulin  It is a pleasure to be a part of your team, thank you for allowing [place] to be a part of your care,  Max and Dr. Allyson Sabal  Please call our clinic if you have any questions or concerns, we may be able to help and keep you from a long and expensive emergency room wait. Our clinic and after hours phone number is [phone], the best time to call is Monday through Friday 9 am to 4 pm but there is always someone available 24/7 if you have an emergency.   If you need medication refills please notify your pharmacy one week in advance and they will send [place] a request.

## 2022-02-17 NOTE — Assessment & Plan Note (Addendum)
BP today at 141/67. Not at goal, but improved from BP two weeks ago, which was at 189/90.  Plan -Continue Coreg 25 mg BID and hydralazine 50 mg TID

## 2022-02-23 NOTE — Progress Notes (Signed)
Internal Medicine Clinic Attending  Case discussed with the resident at the time of the visit.  We reviewed the resident's history and exam and pertinent patient test results.  I agree with the assessment, diagnosis, and plan of care documented in the resident's note.  

## 2022-02-24 ENCOUNTER — Telehealth: Payer: Self-pay | Admitting: *Deleted

## 2022-02-24 ENCOUNTER — Telehealth: Payer: Self-pay

## 2022-02-24 ENCOUNTER — Other Ambulatory Visit (HOSPITAL_COMMUNITY): Payer: Self-pay

## 2022-02-24 ENCOUNTER — Ambulatory Visit (HOSPITAL_COMMUNITY)
Admission: RE | Admit: 2022-02-24 | Discharge: 2022-02-24 | Disposition: A | Discharge status: Home / Self Care | Payer: Commercial Managed Care - HMO | Source: Ambulatory Visit | Attending: Internal Medicine | Admitting: Internal Medicine

## 2022-02-24 DIAGNOSIS — D696 Thrombocytopenia, unspecified: Secondary | ICD-10-CM | POA: Diagnosis present

## 2022-02-24 NOTE — Telephone Encounter (Signed)
There are 3 refills left on Torsemide. Pt was called - no answer, left message pt to call the pharmacy.

## 2022-02-24 NOTE — Telephone Encounter (Signed)
Refill     torsemide (DEMADEX) 20 MG tablet  [facility]

## 2022-03-02 ENCOUNTER — Other Ambulatory Visit (HOSPITAL_COMMUNITY): Payer: Self-pay

## 2022-03-09 ENCOUNTER — Ambulatory Visit (INDEPENDENT_AMBULATORY_CARE_PROVIDER_SITE_OTHER): Payer: Commercial Managed Care - HMO | Admitting: Podiatry

## 2022-03-09 ENCOUNTER — Ambulatory Visit: Payer: Commercial Managed Care - HMO

## 2022-03-09 ENCOUNTER — Ambulatory Visit (INDEPENDENT_AMBULATORY_CARE_PROVIDER_SITE_OTHER): Payer: Commercial Managed Care - HMO

## 2022-03-09 DIAGNOSIS — L84 Corns and callosities: Secondary | ICD-10-CM

## 2022-03-09 DIAGNOSIS — E1149 Type 2 diabetes mellitus with other diabetic neurological complication: Secondary | ICD-10-CM | POA: Diagnosis not present

## 2022-03-09 DIAGNOSIS — L97529 Non-pressure chronic ulcer of other part of left foot with unspecified severity: Secondary | ICD-10-CM

## 2022-03-09 NOTE — Progress Notes (Signed)
Subjective: Chief Complaint  Patient presents with   Callouses    Patient came in today for Charcot foot due to diabetes mellitus, A1c- 6.9 BG- 211, patient as has a diabetic ulcer on the left foot, patient denies any pain    56 year old male presents for above concerns.  He has a thick callus inside of his left big toe which has been growing over time causing discomfort.  No swelling redness or any drainage that he reports.   Objective: AAO x3, NAD DP/PT pulses palpable bilaterally, CRT less than 3 seconds Sensation decreased with Semmes Weinstein monofilament Along the medial aspect of the left hallux there is a large callus noted on the medial aspect the left hallux IPJ.  Upon debridement there was some macerated tissue with superficial skin breakdown. No pain with calf compression, swelling, warmth, erythema      Assessment: Preulcerative large callus left hallux  Plan: -All treatment options discussed with the patient including all alternatives, risks, complications.  -X-rays were obtained reviewed of the left.  3 views were obtained.  No evidence of acute fracture or osteomyelitis. -I sharply debrided the lesion to the any complications.  Superficial skin breakdown noted.  Recommend antibiotic ointment dressing changes daily.  Monitor for any signs or symptoms of infection.  I do expect this to heal uneventfully however if is not healing or getting worse let me know.  If not heal next 2 to 3 weeks to let me know or sooner if there is any worsening.  Verbalized understanding. -Patient encouraged to call the office with any questions, concerns, change in symptoms.   Trula Slade DPM

## 2022-03-09 NOTE — Patient Instructions (Signed)
Keep a small amount of antibiotic ointment on the area where the callus was removed. If there is any opening or drainage let me know.

## 2022-03-16 ENCOUNTER — Other Ambulatory Visit: Payer: Self-pay | Admitting: Student

## 2022-03-16 ENCOUNTER — Encounter: Payer: Commercial Managed Care - HMO | Admitting: Internal Medicine

## 2022-03-16 NOTE — Progress Notes (Deleted)
CC: ***  HPI:  [name] is a 56 y.o. male living with a history stated below and presents today for ***. Please see problem based assessment and plan for additional details.  Past Medical History:  Diagnosis Date   Acute exacerbation of CHF (congestive heart failure) ([place]) 12/31/2021   Anxiety    Panic Atacks   Arthritis    Carpal tunnel syndrome    Constipation    Depression    Diabetes mellitus    DMII   Dysrhythmia    hx tachy hr   GERD (gastroesophageal reflux disease)    no problem in past few years.   Hepatitis    Hyperlipidemia    Hypertension    Neuropathy associated with endocrine disorder ([place])    Seizures ([place])    "stress related" no more since taking Klonopin 2 years- See Neurologist  [facility]   Shortness of breath dyspnea    with exertion   Sleep apnea    used a cpap when he was 350lb-does not need one now 260lb   Wears contact lenses     Current Outpatient Medications on File Prior to Visit  Medication Sig Dispense Refill   atorvastatin (LIPITOR) 40 MG tablet Take 1 tablet (40 mg total) by mouth daily. 30 tablet 2   carvedilol (COREG) 25 MG tablet Take 1 tablet (25 mg total) by mouth 2 (two) times daily with a meal. 60 tablet 5   feeding supplement (BOOST HIGH PROTEIN) LIQD Take 1 Container by mouth daily.     hydrALAZINE (APRESOLINE) 50 MG tablet Take 1 tablet (50 mg total) by mouth 3 (three) times daily. 90 tablet 5   Insulin Glargine (BASAGLAR KWIKPEN) 100 UNIT/ML Inject 15 Units into the skin at bedtime. 15 mL 1   Insulin Pen Needle (PEN NEEDLES) 32G X 4 MM MISC 1 Needle by Does not apply route daily at 6 (six) AM. 200 each 1   prochlorperazine (COMPAZINE) 5 MG tablet Take 1 tablet (5 mg total) by mouth every 8 (eight) hours as needed for nausea or vomiting. 30 tablet 0   torsemide (DEMADEX) 20 MG tablet Take 4 tablets (80 mg total) by mouth 2 (two) times daily. 240 tablet 3   No current facility-administered medications on file prior to  visit.    Family History  Problem Relation Age of Onset   Cancer Mother        pancres   Diabetes Mother    Hypertension Brother    Renal cancer Brother    Cancer Maternal Uncle    Colon cancer Maternal Uncle    Colon cancer Maternal Uncle    Stomach cancer Maternal Uncle    Esophageal cancer Neg Hx    Rectal cancer Neg Hx     Social History   Socioeconomic History   Marital status: Single    Spouse name: Not on file   Number of children: Not on file   Years of education: Not on file   Highest education level: Not on file  Occupational History   Not on file  Tobacco Use   Smoking status: Former    Types: Cigarettes    Quit date: 10/27/2008    Years since quitting: 13.3   Smokeless tobacco: Never   Tobacco comments:    Quit x 10 yrs.  Vaping Use   Vaping Use: Never used  Substance and Sexual Activity   Alcohol use: Not Currently    Comment: Beer sometimes.   Drug use:  No    Types: Marijuana   Sexual activity: Not Currently    Partners: Male    Birth control/protection: None    Comment: last HIV test years ago.  Other Topics Concern   Not on file  Social History Narrative   Not on file   Social Determinants of Health   Financial Resource Strain: Not on file  Food Insecurity: No Food Insecurity (02/17/2022)   Hunger Vital Sign    Worried About Running Out of Food in the Last Year: Never true    Ran Out of Food in the Last Year: Never true  Transportation Needs: No Transportation Needs (02/17/2022)   PRAPARE - Hydrologist (Medical): No    Lack of Transportation (Non-Medical): No  Physical Activity: Not on file  Stress: Not on file  Social Connections: Moderately Integrated (02/17/2022)   Social Connection and Isolation Panel [NHANES]    Frequency of Communication with Friends and Family: More than three times a week    Frequency of Social Gatherings with Friends and Family: More than three times a week    Attends Religious  Services: More than 4 times per year    Active Member of [employer] or Organizations: Yes    Attends Archivist Meetings: More than 4 times per year    Marital Status: Never married  Intimate Partner Violence: Not At Risk (02/17/2022)   Humiliation, Afraid, Rape, and Kick questionnaire    Fear of Current or Ex-Partner: No    Emotionally Abused: No    Physically Abused: No    Sexually Abused: No    Review of Systems: ROS negative except for what is noted on the assessment and plan.  There were no vitals filed for this visit.  Physical Exam: Constitutional: well-appearing *** sitting in ***, in no acute distress HENT: normocephalic atraumatic, mucous membranes moist Eyes: conjunctiva non-erythematous Cardiovascular: regular rate and rhythm, no m/r/g Pulmonary/Chest: normal work of breathing on room air, lungs clear to auscultation bilaterally Abdominal: soft, non-tender, non-distended MSK: normal bulk and tone Neurological: alert & oriented x 3, no focal deficit Skin: warm and dry Psych: normal mood and behavior  Assessment & Plan:   AoC CHF: torsemide 80 bid, coreg, hydral  DM: started basaglar 15u qhs last visit (cbg in 600s, asymptomatic)  Patient {GC/GE:[id number]::"discussed with","seen with"} Dr. {EHOZY:[id number]::"[id number]","V. Hoffman","Mullen","Narendra","Vincent","Guilloud","Lau","Machen"}  No problem-specific Assessment & Plan notes found for this encounter.   Buddy Duty, D.O. [facility], PGY-2 Phone: [phone] Date 03/16/2022 Time 7:45 AM

## 2022-03-24 NOTE — Telephone Encounter (Signed)
RTC to patient stated not sure why Pharmacy is asking for refill on the Lasix.  Stated he is on the Torsemide.  Reminded of appointment on 03/26/2022.in the [facility].

## 2022-03-26 ENCOUNTER — Ambulatory Visit (INDEPENDENT_AMBULATORY_CARE_PROVIDER_SITE_OTHER): Payer: Commercial Managed Care - HMO

## 2022-03-26 VITALS — BP 140/82 | HR 70 | Temp 98.1°F | Ht 70.0 in | Wt 221.5 lb

## 2022-03-26 DIAGNOSIS — I10 Essential (primary) hypertension: Secondary | ICD-10-CM

## 2022-03-26 DIAGNOSIS — N179 Acute kidney failure, unspecified: Secondary | ICD-10-CM | POA: Diagnosis not present

## 2022-03-26 DIAGNOSIS — N185 Chronic kidney disease, stage 5: Secondary | ICD-10-CM

## 2022-03-26 DIAGNOSIS — Z87891 Personal history of nicotine dependence: Secondary | ICD-10-CM

## 2022-03-26 DIAGNOSIS — I132 Hypertensive heart and chronic kidney disease with heart failure and with stage 5 chronic kidney disease, or end stage renal disease: Secondary | ICD-10-CM

## 2022-03-26 DIAGNOSIS — I5033 Acute on chronic diastolic (congestive) heart failure: Secondary | ICD-10-CM | POA: Diagnosis not present

## 2022-03-26 DIAGNOSIS — E1122 Type 2 diabetes mellitus with diabetic chronic kidney disease: Secondary | ICD-10-CM

## 2022-03-26 DIAGNOSIS — E785 Hyperlipidemia, unspecified: Secondary | ICD-10-CM

## 2022-03-26 DIAGNOSIS — F321 Major depressive disorder, single episode, moderate: Secondary | ICD-10-CM

## 2022-03-26 DIAGNOSIS — Z794 Long term (current) use of insulin: Secondary | ICD-10-CM

## 2022-03-26 LAB — BASIC METABOLIC PANEL
Anion gap: 7 (ref 5–15)
BUN: 72 mg/dL — ABNORMAL HIGH (ref 6–20)
CO2: 24 mmol/L (ref 22–32)
Calcium: 8.3 mg/dL — ABNORMAL LOW (ref 8.9–10.3)
Chloride: 106 mmol/L (ref 98–111)
Creatinine, Ser: 4.64 mg/dL — ABNORMAL HIGH (ref 0.61–1.24)
GFR, Estimated: 14 mL/min — ABNORMAL LOW (ref >60)
Glucose, Bld: 149 mg/dL — ABNORMAL HIGH (ref 70–99)
Potassium: 4.6 mmol/L (ref 3.5–5.1)
Sodium: 137 mmol/L (ref 135–145)

## 2022-03-26 LAB — GLUCOSE, CAPILLARY: Glucose-Capillary: 146 mg/dL — ABNORMAL HIGH (ref 70–99)

## 2022-03-26 MED ORDER — SERTRALINE HCL 25 MG PO TABS: 25.0000 mg | ORAL_TABLET | Freq: Every day | ORAL | 0 refills | Status: DC

## 2022-03-26 NOTE — Assessment & Plan Note (Signed)
CKD is advanced to stage V.  He has met with nephrology to discuss the likelihood of dialysis in the future.  He does have proteinuria but starting ACE/ARB would be of low benefit to him at this stage.  He is contraindicated from starting SGLT2 as well.  Will continue to monitor and follow-up with nephrology as well.  Will get BMP today

## 2022-03-26 NOTE — Assessment & Plan Note (Addendum)
PHQ9 score today was 20, patient does endorse intermittent suicidal ideation and has had a prior attempt when he was 20.  His major stressor is his recent health issues including the prospect of starting dialysis within the next year, multiple heart failure exacerbations, and upcoming eye surgery.  He said that there are days when he feels like he does not want to get out of bed.  He currently has no SI and says he has no plan to act out on any of his episodes of SI. He has multiple safety plans and crisis contacts in place. He used to take sertraline but stopped because of insurance problems, but he has medicaid now.  Will start low dose sertraline and titrate. Also referred to behavioral health and provided [facility] with [facility]. F\u in 4 weeks to uptitrate sertraline and reassess.

## 2022-03-26 NOTE — Assessment & Plan Note (Signed)
Has HFrEF with recent hospitalizations this year.  On torsemide 80 mg twice daily.  Unable to start SGLT2 or MRA during his hospital visits because of his kidney function.  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  He is asymptomatic today without much edema, dyspnea, PND.  He does feel like he has gained some weight over Thanksgiving because of not being able to get torsemide for a few days but he has since been taking his medicine every day and improved.  Some discrepancies in weight as he is 221 pounds today and was 204 pounds on November 14.  He says his scale at home put him out about to 10 to 15 pounds this morning.  Possibly some discrepancy between weights.  On exam, he has no lower extremity pitting edema, no crackles in his lungs, no abdominal fluid wave, no JVD.  Feel like his weight is likely not a reliable marker as there is clearly no volume overload on exam.  Will obtain BMP today and if he feels like he is gaining volume at home, informed him to let us know so we can possibly increase torsemide dose to 100 twice daily.  Also provided strict return precautions for if he gained significant amounts of volume.

## 2022-03-26 NOTE — Progress Notes (Signed)
CC: Routine follow-up  HPI:  [name] a 56 y.o.-year-old male with past medical history as below presenting for routine follow-up of chronic problems.  Please see encounters tab for problem-based charting.  Past Medical History:  Diagnosis Date   Acute exacerbation of CHF (congestive heart failure) ([place]) 12/31/2021   Anxiety    Panic Atacks   Arthritis    Carpal tunnel syndrome    Constipation    Depression    Diabetes mellitus    DMII   Dysrhythmia    hx tachy hr   GERD (gastroesophageal reflux disease)    no problem in past few years.   Hepatitis    Hyperlipidemia    Hypertension    Neuropathy associated with endocrine disorder ([place])    Seizures ([place])    "stress related" no more since taking Klonopin 2 years- See Neurologist  [facility]   Shortness of breath dyspnea    with exertion   Sleep apnea    used a cpap when he was 350lb-does not need one now 260lb   Wears contact lenses    Review of Systems: As in HPI.  Please see encounters tab for problem based charting.  Physical Exam:  Vitals:   03/26/22 1019  BP: (!) 140/82  Pulse: 70  Temp: 98.1 F (36.7 C)  TempSrc: Oral  SpO2: 100%  Weight: 221 lb 8 oz (100.5 kg)  Height: _0  (1.778 m)   General:Well-appearing, pleasant, In NAD Cardiac: RRR, no murmurs rubs or gallops.  No JVD.  No lower extremity pitting edema. Respiratory: Normal work of breathing on room air, CTAB Abdominal: Soft, nontender, nondistended.  No fluid wave   Assessment & Plan:   Essential hypertension BP approximately at goal today on coreg 25 BID and hydral 50 TID. No symptoms.  Type 2 diabetes mellitus ([place]) On basaglar 15.  Due for A1c today.  Depression PHQ9 score today was 20, patient does endorse intermittent suicidal ideation and has had a prior attempt when he was 20.  His major stressor is his recent health issues including the prospect of starting dialysis within the next year, multiple heart failure  exacerbations, and upcoming eye surgery.  He said that there are days when he feels like he does not want to get out of bed.  He currently has no SI and says he has no plan to act out on any of his episodes of SI. He has multiple safety plans and crisis contacts in place. He used to take sertraline but stopped because of insurance problems, but he has medicaid now.  Will start low dose sertraline and titrate. Also referred to behavioral health and provided him with [facility]. F\u in 4 weeks to uptitrate sertraline and reassess.  Acute on chronic heart failure with preserved ejection fraction ([facility]) Has HFrEF with recent hospitalizations this year.  On torsemide 80 mg twice daily.  Unable to start SGLT2 or MRA during his hospital visits because of his kidney function.  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  He is asymptomatic today without much edema, dyspnea, PND.  He does feel like he has gained some weight over Thanksgiving because of not being able to get torsemide for a few days but he has since been taking his medicine every day and improved.  Some discrepancies in weight as he is 221 pounds today and was 204 pounds on November 14.  He says his scale at home put him out about to 10 to 15 pounds this morning.  Possibly some discrepancy between weights.  On exam, he has no lower extremity pitting edema, no crackles in his lungs, no abdominal fluid wave, no JVD.  Feel like his weight is likely not a reliable marker as there is clearly no volume overload on exam.  Will obtain BMP today and if he feels like he is gaining volume at home, informed him to let us know so we can possibly increase torsemide dose to 100 twice daily.  Also provided strict return precautions for if he gained significant amounts of volume.  Acute-on-chronic kidney injury ([place]) CKD is advanced to stage V.  He has met with nephrology to discuss the likelihood of  dialysis in the future.  He does have proteinuria but starting ACE/ARB would be of low benefit to him at this stage.  He is contraindicated from starting SGLT2 as well.  Will continue to monitor and follow-up with nephrology as well.  Will get BMP today   Patient seen with Dr.  Cain Sieve

## 2022-03-26 NOTE — Patient Instructions (Addendum)
[name], it was a pleasure seeing you today! You endorsed feeling well today. Below are some of the things we talked about this visit. We look forward to seeing you in the follow up appointment!  Today we discussed: Heart failure: I'll follow up on your labs and let you know if we can do an extra dose of torsemide if necessary  Diabetes: I'll let you know what your A1c is  Cholesterol: I'll let you know what your cholesterol is  I'm sending your zoloft prescription to the [facility] on [address]  [email or web address] [phone]    I have ordered the following labs today:  Lab Orders         BMP8+Anion Gap         Lipid Profile         POC Hbg A1C        Referrals ordered today:   Referral Orders         Ambulatory referral to [facility]       I have ordered the following medication/changed the following medications:   Stop the following medications: There are no discontinued medications.   Start the following medications: Meds ordered this encounter  Medications   sertraline (ZOLOFT) 25 MG tablet    Sig: Take 1 tablet (25 mg total) by mouth daily.    Dispense:  30 tablet    Refill:  0     Follow-up:  4 weeks    Please make sure to arrive 15 minutes prior to your next appointment. If you arrive late, you may be asked to reschedule.   We look forward to seeing you next time. Please call our clinic at [phone] if you have any questions or concerns. The best time to call is Monday-Friday from 9am-4pm, but there is someone available 24/7. If after hours or the weekend, call the main hospital number and ask for the Internal Medicine Resident On-Call. If you need medication refills, please notify your pharmacy one week in advance and they will send [place] a request.  Thank you for letting us take part in your care. Wishing you the best!  Thank you, Linus Galas MD

## 2022-03-26 NOTE — Assessment & Plan Note (Signed)
BP approximately at goal today on coreg 25 BID and hydral 50 TID. No symptoms.

## 2022-03-26 NOTE — Assessment & Plan Note (Addendum)
On basaglar 15.  Due for A1c today.

## 2022-03-27 LAB — LIPID PANEL
Chol/HDL Ratio: 2.3 ratio (ref 0.0–5.0)
Cholesterol, Total: 153 mg/dL (ref 100–199)
HDL: 66 mg/dL (ref >39)
LDL Chol Calc (NIH): 78 mg/dL (ref 0–99)
Triglycerides: 39 mg/dL (ref 0–149)
VLDL Cholesterol Cal: 9 mg/dL (ref 5–40)

## 2022-03-28 NOTE — Progress Notes (Signed)
Internal Medicine Clinic Attending  I saw and evaluated the patient.  I personally confirmed the key portions of the history and exam documented by Dr. {QMVHQ:[id number]::"MWUXLKGM","WNUUVOZ","DGUYQ","IHKVQ","QVZDGLOV","FIEPPIR","JJOACZYS","AYTKZS","WFUXNATFT","DDU","KGUR","KYHCWC","BJSEGBTD","VVOHYWV","PXTG","GYIRSWNIOEV","OJJKK","XFGHWEX","HBZJIR","CVELF","YBOFBP","ZWCHE","NIDPOEU","MPNTIRWE"} and I reviewed pertinent patient test results.  The assessment, diagnosis, and plan were formulated together and I agree with the documentation in the resident's note.

## 2022-04-21 NOTE — Progress Notes (Incomplete)
CC: ***  HPI:   [name] is a 57 y.o. male with a past medical history of CHF, anxiety, depression, diabetes, hyperlipidemia, hypertension, and seizures who presents for routine follow-up. He was started on sertraline at his last [facility] visit on 03-26-2022.    Essential hypertension BP approximately at goal today on coreg 25 BID and hydral 50 TID. No symptoms.  Xxx    Type 2 diabetes mellitus (HCC) On glargine 15.  Due for A1c today, 6.9 in 12-2021  Microalbumin-creatinine ratio 5,352 High  in 01-2022  Xxx    Depression PHQ9 score today was 20, patient does endorse intermittent suicidal ideation and has had a prior attempt when he was 20.  His major stressor is his recent health issues including the prospect of starting dialysis within the next year, multiple heart failure exacerbations, and upcoming eye surgery.  He said that there are days when he feels like he does not want to get out of bed.  He currently has no SI and says he has no plan to act out on any of his episodes of SI. He has multiple safety plans and crisis contacts in place. He used to take sertraline but stopped because of insurance problems, but he has medicaid now. Will start low dose sertraline and titrate. Also referred to behavioral health and provided him with contact info for [facility]. F\u in 4 weeks to uptitrate sertraline and reassess.  Xxx Phq-9 score today is __    Acute on chronic heart failure with preserved ejection fraction (HCC) Has HFrEF with recent hospitalizations this year.  On torsemide 80 mg twice daily.  Unable to start SGLT2 or MRA during his hospital visits because of his kidney function.  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  He is asymptomatic today without much edema, dyspnea, PND.  He does feel like he has gained some weight over Thanksgiving because of not being able to get torsemide for a few days but he has since been taking  his medicine every day and improved.  Some discrepancies in weight as he is 221 pounds today and was 204 pounds on November 14.  He says his scale at home put him out about to 10 to 15 pounds this morning.  Possibly some discrepancy between weights.  On exam, he has no lower extremity pitting edema, no crackles in his lungs, no abdominal fluid wave, no JVD.  Feel like his weight is likely not a reliable marker as there is clearly no volume overload on exam.  Will obtain BMP today and if he feels like he is gaining volume at home, informed him to let us know so we can possibly increase torsemide dose to 100 twice daily.  Also provided strict return precautions for if he gained significant amounts of volume.   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  He has met with nephrology to discuss the likelihood of dialysis in the future.  He does have proteinuria but starting ACE/ARB would be of low benefit to him at this stage.  He is contraindicated from starting SGLT2 as well.  Will continue to monitor and follow-up with nephrology as well.  Will get BMP today  Xxx No follow up with nephro yet   Past Medical History:  Diagnosis Date   Acute exacerbation of CHF (congestive heart failure) ([place]) 12/31/2021   Anxiety    Panic  Atacks   Arthritis    Carpal tunnel syndrome    Constipation    Depression    Diabetes mellitus    DMII   Dysrhythmia    hx tachy hr   GERD (gastroesophageal reflux disease)    no problem in past few years.   Hepatitis    Hyperlipidemia    Hypertension    Neuropathy associated with endocrine disorder ([place])    Seizures ([place])    "stress related" no more since taking Klonopin 2 years- See Neurologist  [facility]   Shortness of breath dyspnea    with exertion   Sleep apnea    used a cpap when he was 350lb-does not need one now 260lb   Wears contact lenses      Review of Systems:    Reports *** Denies *** (subjective fever?, pain  anywhere?, bowel changes?)   Physical Exam:  There were no vitals filed for this visit.  General:   awake and alert, sitting comfortably in chair, cooperative, not in acute distress Skin:   warm and dry, intact without any obvious lesions or scars, no rashes or lesions  Head:   normocephalic and atraumatic, oral mucosa moist with good dentition, no lymphadenopathy Eyes:   extraocular movements intact, conjunctivae pink, pupils round and reactive to light, no periorbital swelling or scleral icterus Ears:   pinnae normal, no discharge or external lesions  Nose:   symmetrical and without mucosal inflammation, no external lesions or discharge Lungs:   normal respiratory effort, breathing unlabored, symmetrical chest rise, no crackles or wheezing Cardiac:   regular rate and rhythm, normal S1 and S2, capillary refill 2-3 seconds, dorsalis pedis pulses intact bilaterally, no pitting edema Abdomen:   soft and non-distended, normoactive bowel sounds present in all four quadrants, no guarding or palpable masses Musculoskeletal:   full range of motion in joints, motor strength 5 /5 in all four extremities, no obvious deformities or joint tenderness Neurologic:   oriented to person-place-time, moving all extremities, sensation to light touch intact, no facial droop Psychiatric:   euthymic mood with congruent affect, intelligible speech    Assessment & Plan:   No problem-specific Assessment & Plan notes found for this encounter.     See Encounters Tab for problem based charting.  Patient {GC/GE:3044014::"discussed with","seen with"} Dr. {NAMES:[id number]::"Guilloud","Hoffman","Mullen","Narendra","Williams","Vincent"}

## 2022-04-23 ENCOUNTER — Encounter: Payer: Commercial Managed Care - HMO | Admitting: Student

## 2022-04-27 ENCOUNTER — Other Ambulatory Visit: Payer: Self-pay | Admitting: Student

## 2022-04-27 DIAGNOSIS — E785 Hyperlipidemia, unspecified: Secondary | ICD-10-CM

## 2022-04-28 ENCOUNTER — Other Ambulatory Visit: Payer: Self-pay | Admitting: Student

## 2022-04-28 DIAGNOSIS — I5033 Acute on chronic diastolic (congestive) heart failure: Secondary | ICD-10-CM

## 2022-04-30 ENCOUNTER — Telehealth: Payer: Self-pay

## 2022-04-30 DIAGNOSIS — I5033 Acute on chronic diastolic (congestive) heart failure: Secondary | ICD-10-CM

## 2022-04-30 NOTE — Telephone Encounter (Signed)
Decision:Denied  Request Reference Number: [id number]. TORSEMIDE TAB 20MG  is denied for not meeting the prior authorization requirement(s). For further questions, call [facility] at [phone] for more information.

## 2022-04-30 NOTE — Telephone Encounter (Signed)
Prior Authorization for patient (Torsemide) came through on cover my meds was submitted with last office notes awaiting approval or denial

## 2022-05-08 ENCOUNTER — Other Ambulatory Visit (HOSPITAL_COMMUNITY): Payer: Self-pay

## 2022-05-08 MED ORDER — BUMETANIDE 2 MG PO TABS
4.0000 mg | ORAL_TABLET | Freq: Two times a day (BID) | ORAL | 0 refills | Status: DC
  Filled 2022-05-08: qty 55, 14d supply, fill #0
  Filled 2022-05-12: qty 65, 16d supply, fill #0

## 2022-05-08 MED ORDER — TORSEMIDE 20 MG PO TABS
80.0000 mg | ORAL_TABLET | Freq: Two times a day (BID) | ORAL | 0 refills | Status: DC
  Filled 2022-05-08: qty 240, 30d supply, fill #0

## 2022-05-08 NOTE — Addendum Note (Signed)
Addended by: Charise Killian on: 05/08/2022 11:43 AM   Modules accepted: Orders

## 2022-05-08 NOTE — Telephone Encounter (Signed)
Secure chat  NOTE : between Dr Saverio Danker and Myself  with pharmacist on the phone ( COPIED AND PASTE)       Good morning. I sent a message back about Mr. Carreira's torsemide. Are you able to follow up with pharmacy to see if the 30 day supply is covered?  10:39 AM I will call them now and get back to you   1  49 mins I am on the the phone with the pharmacist the max dose is 4 tabs a day   46 mins GL Charise Killian, MD Faythe Ghee. Can you ask them if there are limitations on Bumex? Or do they need a prescription first  45 mins she is not sure so send in the rx to make sure   44 mins GL Charise Killian, MD Okay I will  88 mins Maxie Barb, RN was added by you. 43 mins GH GL Charise Killian, MD New prescription for Bumex sent to [facility]. I spoke with Mr. Mchaffie  1 min ok thanks for helping

## 2022-05-08 NOTE — Telephone Encounter (Signed)
Pt called about his PA  was done on 04/30/22 .Marland Kitchen He has been out of his meds for  2 days ... He stated that he is already having fluid  build up  his next appt is 2/6.Marland Kitchen The pharmacy  is going to sell him 24 pills for $10  can someone please  assist  with new med .... Pharmacist stated that the insurance will not cover  90 days supply maybe you can try and fewer  days supply .Marland KitchenMarland Kitchen

## 2022-05-08 NOTE — Telephone Encounter (Addendum)
30 day supply sent to [facility] to see about insurance coverage. 24 tablets should last for the next three days. If not covered, we could consider alternative diuretic.  Addendum: Insurance will only cover 4 tablets/day of torsemide, half of Mr. Soules current dosage. Will send bumetanide 4 mg bid to [facility]. Per CMA Judyann Munson, pharmacy will cover 30 days while we work out insurance coverage. Discussed with Mr. Michele, f/u in clinic on Tuesday, 05/12/22.

## 2022-05-11 ENCOUNTER — Other Ambulatory Visit (HOSPITAL_COMMUNITY): Payer: Self-pay

## 2022-05-12 ENCOUNTER — Ambulatory Visit: Payer: Medicaid Other

## 2022-05-12 ENCOUNTER — Telehealth: Payer: Self-pay

## 2022-05-12 ENCOUNTER — Other Ambulatory Visit (HOSPITAL_COMMUNITY): Payer: Self-pay

## 2022-05-12 VITALS — BP 141/67 | HR 72 | Temp 98.5°F | Wt 213.0 lb

## 2022-05-12 DIAGNOSIS — Z87891 Personal history of nicotine dependence: Secondary | ICD-10-CM

## 2022-05-12 DIAGNOSIS — I10 Essential (primary) hypertension: Secondary | ICD-10-CM

## 2022-05-12 DIAGNOSIS — Z794 Long term (current) use of insulin: Secondary | ICD-10-CM

## 2022-05-12 DIAGNOSIS — F32A Depression, unspecified: Secondary | ICD-10-CM | POA: Diagnosis not present

## 2022-05-12 DIAGNOSIS — N185 Chronic kidney disease, stage 5: Secondary | ICD-10-CM

## 2022-05-12 DIAGNOSIS — E1122 Type 2 diabetes mellitus with diabetic chronic kidney disease: Secondary | ICD-10-CM | POA: Diagnosis present

## 2022-05-12 DIAGNOSIS — I120 Hypertensive chronic kidney disease with stage 5 chronic kidney disease or end stage renal disease: Secondary | ICD-10-CM

## 2022-05-12 LAB — POCT GLYCOSYLATED HEMOGLOBIN (HGB A1C): Hemoglobin A1C: 7.7 % — AB (ref 4.0–5.6)

## 2022-05-12 LAB — GLUCOSE, CAPILLARY: Glucose-Capillary: 115 mg/dL — ABNORMAL HIGH (ref 70–99)

## 2022-05-12 MED ORDER — TRULICITY 0.75 MG/0.5ML ~~LOC~~ SOAJ
0.7500 mg | SUBCUTANEOUS | 1 refills | Status: DC
  Filled 2022-05-12: qty 2, 28d supply, fill #0

## 2022-05-12 MED ORDER — TRULICITY 1.5 MG/0.5ML ~~LOC~~ SOAJ
1.5000 mg | SUBCUTANEOUS | 2 refills | Status: DC
  Filled 2022-05-12 – 2022-06-26 (×3): qty 2, 28d supply, fill #0
  Filled 2022-07-21: qty 2, 28d supply, fill #1

## 2022-05-12 MED ORDER — HYDRALAZINE HCL 50 MG PO TABS
50.0000 mg | ORAL_TABLET | Freq: Three times a day (TID) | ORAL | 5 refills | Status: DC
  Filled 2022-05-12: qty 90, 30d supply, fill #0
  Filled 2022-06-09 – 2022-06-26 (×2): qty 90, 30d supply, fill #1
  Filled 2022-07-21: qty 90, 30d supply, fill #2
  Filled 2022-09-02 – 2022-09-28 (×2): qty 90, 30d supply, fill #3

## 2022-05-12 MED ORDER — SERTRALINE HCL 50 MG PO TABS
50.0000 mg | ORAL_TABLET | Freq: Every day | ORAL | 2 refills | Status: DC
  Filled 2022-05-12: qty 60, 60d supply, fill #0
  Filled 2022-07-21: qty 60, 60d supply, fill #1
  Filled 2022-10-25: qty 60, 60d supply, fill #2

## 2022-05-12 NOTE — Assessment & Plan Note (Signed)
Patient last seen in clinic for this condition in 03/2022. BMP from this visit demonstrated a GFR of 14. He had already met with nephrology to discuss the likelihood of dialysis in the future. Plan was to f/u with nephrology again. Patient met with nephrology but is still deciding on HD vs PD. He will see nephro in 4 weeks.   Plan: - Continue f/u w/ nephrology

## 2022-05-12 NOTE — Assessment & Plan Note (Signed)
Patient last seen for depression in clinic in 03/2022. He was started on sertraline 25 mg daily. Patient reports symptom improvement since starting this medication. He experienced insomnia for the first week which has since resolved. Denies SI or HI today. PHQ9 today 17 today improved from 20 at last visit. Patient would like to increase his dose today.   Plan: - Increase to sertraline 50 mg daily

## 2022-05-12 NOTE — Progress Notes (Signed)
CC: CKD5  HPI:  [name] a 57 y.o. male with past medical history of HTN, HLD, T2DM, CHF, CKD5, OSA, Hep C, and depression that presents for CKD5.   Patient last seen in clinic for [place] in 03/2022. BMP from this visit demonstrated a GFR of 14. He had already met with nephrology to discuss the likelihood of dialysis in the future. Plan was to f/u with nephrology again. Patient met with nephrology but is still deciding on HD vs PD. He will see nephro in 4 weeks.   Patient has a history of T2DM. Current medications include Basaglar 15 units daily. Patient states that they are compliant with this medication. Patient had difficulty tolerateing metformin and victoza in the past due to GI side effects. He was able to control his diabetes with diet in 01/2022 however was restarted on basaglar after he had significantly elevated glucose in clinic in 02/2022. Patient does check their blood sugar at home regularly and notes values in the mid 100s. Occasionally up in the 200s-300s. No lows at home. Patient endorses polyuria, polydipsia, fatigue over the last few weeks. Patient states that they do visit the ophthalmologist for yearly eye exams.   Patient has a history of HTN. Current medications include hydrALAZINE 50 mg TID and carvedilol 25 mg BID. Patient states that they are compliant with these medications but has not taken his medications yet today. Patient has not tolerated amlodipine previously due to leg swelling. Patient also developed swelling with lisinopril. He is also having difficulty getting his hydralazine covered by insurance. Patient states that they do check their BP regularly at home and usually notes values near 140/80. Patient endorses lightheadedness with standing over the last few years. Patient denies HA, CP, or SOB.   Patient last seen for depression in clinic in 03/2022. He was started on sertraline 25 mg daily. Patient reports symptom improvement since starting this  medication. He experienced insomnia for the first week which has since resolved. Denies SI or HI today. Patient would like to increase his dose today.   Allergies as of 05/12/2022       Reactions   Norvasc [amlodipine] Other (See Comments)   Bilateral leg swelling   Zestril [lisinopril] Swelling, Other (See Comments)   Face and legs swell        Medication List        Accurate as of May 12, 2022 10:07 AM. If you have any questions, ask your nurse or doctor.          atorvastatin 40 MG tablet Commonly known as: LIPITOR TAKE 1 TABLET(40 MG) BY MOUTH DAILY   Basaglar KwikPen 100 UNIT/ML Inject 15 Units into the skin at bedtime.   bumetanide 2 MG tablet Commonly known as: Bumex Take 2 tablets (4 mg total) by mouth 2 (two) times daily.   carvedilol 25 MG tablet Commonly known as: COREG Take 1 tablet (25 mg total) by mouth 2 (two) times daily with a meal.   feeding supplement Liqd Take 1 Container by mouth daily.   hydrALAZINE 50 MG tablet Commonly known as: APRESOLINE Take 1 tablet (50 mg total) by mouth 3 (three) times daily.   Pen Needles 32G X 4 MM Misc 1 Needle by Does not apply route daily at 6 (six) AM.   prochlorperazine 5 MG tablet Commonly known as: COMPAZINE Take 1 tablet (5 mg total) by mouth every 8 (eight) hours as needed for nausea or vomiting.   sertraline 50 MG  tablet Commonly known as: Zoloft Take 1 tablet (50 mg total) by mouth daily. What changed:  medication strength how much to take Changed by: Starlyn Skeans, MD   Trulicity 1.61 WR/6.0AV Sopn Generic drug: Dulaglutide Inject 0.75 mg into the skin once a week. Started by: Starlyn Skeans, MD   Trulicity 1.5 WU/9.8JX Sopn Generic drug: Dulaglutide Inject 1.5 mg into the skin once a week. Start taking on: June 10, 2022 Started by: Starlyn Skeans, MD         Past Medical History:  Diagnosis Date   Acute exacerbation of CHF (congestive heart failure) ([place]) 12/31/2021   Anxiety    Panic  Atacks   Arthritis    Carpal tunnel syndrome    Constipation    Depression    Diabetes mellitus    DMII   Dysrhythmia    hx tachy hr   GERD (gastroesophageal reflux disease)    no problem in past few years.   Hepatitis    Hyperlipidemia    Hypertension    Neuropathy associated with endocrine disorder ([place])    Seizures ([place])    "stress related" no more since taking Klonopin 2 years- See Neurologist  [facility]   Shortness of breath dyspnea    with exertion   Sleep apnea    used a cpap when he was 350lb-does not need one now 260lb   Wears contact lenses    Review of Systems:  per HPI.   Physical Exam: Vitals:   05/12/22 0900 05/12/22 1000  BP: (!) 162/80 (!) 141/67  Pulse: 80 72  Temp: 98.5 F (36.9 C)   TempSrc: Oral   SpO2: 98%   Weight: 213 lb (96.6 kg)    Constitutional: Well-developed, well-nourished, appears comfortable  HENT: Normocephalic and atraumatic.  Eyes: EOM are normal. PERRL.  Neck: Normal range of motion.  Cardiovascular: Regular rate, regular rhythm. No murmurs, rubs, or gallops. Normal radial and PT pulses bilaterally. Trace bilateral LE edema.  Pulmonary: Normal respiratory effort. No wheezes, rales, or rhonchi.   Abdominal: Soft. Non-distended. No tenderness. Normal bowel sounds.  Musculoskeletal: Normal range of motion.     Neurological: Alert and oriented to person, place, and time. Non-focal. Skin: warm and dry.    Assessment & Plan:   See Encounters Tab for problem based charting.  Patient seen with Dr. Cain Sieve

## 2022-05-12 NOTE — Assessment & Plan Note (Signed)
Patient has a history of HTN. Current medications include hydrALAZINE 50 mg TID and carvedilol 25 mg BID. Patient states that they are compliant with these medications but has not taken his medications yet today. Patient has not tolerated amlodipine previously due to leg swelling. Patient also developed swelling with lisinopril. He is also having difficulty getting his hydralazine covered by insurance. Patient states that they do check their BP regularly at home and usually notes values near 140/80. Patient endorses lightheadedness with standing over the last few years. Patient denies HA, CP, or SOB. Initial BP today is 162/80. Repeat BP is 141/67. Given his ESRD, will not start an ARB at this time. Patient also has a history of CHF and was switched from torsemide to bumex this week. He has been without a diuretic for a few days and has minimal LE edema on exam. Suspect that starting his new diuretic will help with his blood pressure. Hydralazine is also 15$ a month at [facility]. Will continue with hydralazine for now given that he has not tolerated amlodipine.   Plan: - Continue carvedilol 25 mg BID and hydrALAZINE 50 mg TID

## 2022-05-12 NOTE — Assessment & Plan Note (Signed)
Patient has a history of T2DM. Current medications include Basaglar 15 units daily. Patient states that they are compliant with this medication. Patient had difficulty tolerateing metformin and victoza in the past due to GI side effects. He was able to control his diabetes with diet in 01/2022 however was restarted on basaglar after he had significantly elevated glucose in clinic in 02/2022. Patient does check their blood sugar at home regularly and notes values in the mid 100s. Occasionally up in the 200s-300s. No lows at home. Patient endorses polyuria, polydipsia, fatigue over the last few weeks. Patient states that they do visit the ophthalmologist for yearly eye exams. A1c was 6.9% four months ago ago. A1c today 7.7%.    Plan: - Continue Basaglar 15 units daily - Start trulicity 7.19 mg once weekly (if tolerating after 4 weeks can increase to 1.5 mg once weekly) - F/u ophthalmology for yearly eye exams

## 2022-05-12 NOTE — Telephone Encounter (Signed)
Prior Authorization for patient (Trulicity) came through on cover my meds was submitted with last office notes and labs awaiting approval or denial 

## 2022-05-12 NOTE — Patient Instructions (Addendum)
Thank you for coming to see [name] in clinic Mr. Cragle.  Plan: - Please continue to follow up with nephrology regarding dialysis and management of your blood pressure  - Please continue taking basaglar 15 units daily for your diabetes  - Please start using trulicity 9.59 mg once weekly for your diabetes (after 4 weeks if you are tolerating this medication we can increase to 1.5 mg once weekly)  - Please continue taking hydralazine 50 mg three times daily and carvedilol 25 mg twice daily for your high blood pressure (we ref[facility]ralazine at [facility] for a discounted price)  - Please start taking bumex as prescribed  It was very nice to see you, thank you for allowing [name] to be involved in your care.   Please arrive to your next appointment 5-10 minutes before your scheduled appointment time, thank you!

## 2022-05-12 NOTE — Telephone Encounter (Signed)
Decision:Approved [name] (Key: [id number]) PA Case ID #: [id number] Need Help? Call us at [phone] Outcome Approved today Request Reference Number: [id number]. TRULICITY INJ 5.8/7.2 is approved through 05/13/2023. For further questions, call [employer] at [phone]. Authorization Expiration Date: 06/13/4318 Drug Trulicity 1.5MG /0.5ML pen-injectors ePA cloud logo Form [employer] Electronic Prior Authorization Form [phone] NCPDP)

## 2022-05-13 NOTE — Progress Notes (Signed)
Internal Medicine Clinic Attending  Case discussed with Dr. Mapp  At the time of the visit.  We reviewed the resident's history and exam and pertinent patient test results.  I agree with the assessment, diagnosis, and plan of care documented in the resident's note.  

## 2022-05-18 ENCOUNTER — Other Ambulatory Visit (HOSPITAL_COMMUNITY): Payer: Self-pay

## 2022-05-20 ENCOUNTER — Other Ambulatory Visit (HOSPITAL_COMMUNITY): Payer: Self-pay

## 2022-06-09 ENCOUNTER — Other Ambulatory Visit: Payer: Self-pay | Admitting: Internal Medicine

## 2022-06-09 ENCOUNTER — Ambulatory Visit (INDEPENDENT_AMBULATORY_CARE_PROVIDER_SITE_OTHER): Payer: Medicaid Other | Admitting: Podiatry

## 2022-06-09 ENCOUNTER — Other Ambulatory Visit: Payer: Self-pay

## 2022-06-09 DIAGNOSIS — B351 Tinea unguium: Secondary | ICD-10-CM | POA: Diagnosis not present

## 2022-06-09 DIAGNOSIS — L84 Corns and callosities: Secondary | ICD-10-CM

## 2022-06-09 DIAGNOSIS — E1149 Type 2 diabetes mellitus with other diabetic neurological complication: Secondary | ICD-10-CM

## 2022-06-09 DIAGNOSIS — M79674 Pain in right toe(s): Secondary | ICD-10-CM | POA: Diagnosis not present

## 2022-06-09 DIAGNOSIS — M79675 Pain in left toe(s): Secondary | ICD-10-CM

## 2022-06-09 LAB — LAB REPORT - SCANNED: EGFR: 16

## 2022-06-10 NOTE — Telephone Encounter (Signed)
Next appt scheduled 5/8 with Dr Alton Revere.

## 2022-06-11 ENCOUNTER — Other Ambulatory Visit: Payer: Self-pay

## 2022-06-11 ENCOUNTER — Other Ambulatory Visit (HOSPITAL_COMMUNITY): Payer: Self-pay

## 2022-06-11 MED ORDER — BUMETANIDE 2 MG PO TABS
4.0000 mg | ORAL_TABLET | Freq: Two times a day (BID) | ORAL | 0 refills | Status: DC
  Filled 2022-06-11 – 2022-06-26 (×2): qty 120, 30d supply, fill #0

## 2022-06-12 ENCOUNTER — Other Ambulatory Visit (HOSPITAL_COMMUNITY): Payer: Self-pay

## 2022-06-16 NOTE — Progress Notes (Signed)
Subjective: Chief Complaint  Patient presents with   Diabetes    Diabetic foot care, A1c- 7.7 BG-146, nail trim and callus     57 year old male presents for above concerns.  Nails are thick and elongated causing discomfort.  He has a thick callus inside of his left big toe which has been growing over time causing discomfort.  No swelling redness or any drainage that he reports.  No injuries.  No other concerns.   Last A1c was 7.7 on May 12, 2022 Linus Galas, MD-last seen May 12, 2022   Objective: AAO x3, NAD DP/PT pulses palpable bilaterally, CRT less than 3 seconds Sensation decreased with Semmes Weinstein monofilament Nails are hypertrophic, dystrophic, brittle, discolored, elongated 10. No surrounding redness or drainage. Tenderness nails 1-5 bilaterally.  Along the medial aspect of the left hallux there is a large callus noted on the medial aspect the left hallux IPJ.  Upon debridement there was some macerated tissue with superficial skin breakdown. No pain with calf compression, swelling, warmth, erythema      Assessment: Preulcerative large callus left hallux  Plan: -All treatment options discussed with the patient including all alternatives, risks, complications.  -Nails debrided x 10 without any complications or bleeding -I sharply debrided the lesion to the any complications further bleeding.  Continue offloading, moisturizer. -Daily foot inspection. -Patient encouraged to call the office with any questions, concerns, change in symptoms.   Trula Slade DPM

## 2022-06-18 ENCOUNTER — Ambulatory Visit (HOSPITAL_COMMUNITY): Payer: Medicaid Other | Admitting: Psychiatry

## 2022-06-25 ENCOUNTER — Other Ambulatory Visit (HOSPITAL_COMMUNITY): Payer: Self-pay

## 2022-06-26 ENCOUNTER — Other Ambulatory Visit (HOSPITAL_COMMUNITY): Payer: Self-pay

## 2022-07-01 ENCOUNTER — Encounter: Payer: Self-pay | Admitting: Internal Medicine

## 2022-07-07 ENCOUNTER — Ambulatory Visit (HOSPITAL_COMMUNITY): Payer: Medicaid Other | Admitting: Psychiatry

## 2022-07-07 ENCOUNTER — Encounter (HOSPITAL_COMMUNITY): Payer: Self-pay

## 2022-07-21 ENCOUNTER — Other Ambulatory Visit: Payer: Self-pay

## 2022-07-22 ENCOUNTER — Other Ambulatory Visit: Payer: Self-pay

## 2022-07-22 ENCOUNTER — Other Ambulatory Visit (HOSPITAL_COMMUNITY): Payer: Self-pay

## 2022-07-22 MED ORDER — BUMETANIDE 2 MG PO TABS
4.0000 mg | ORAL_TABLET | Freq: Two times a day (BID) | ORAL | 0 refills | Status: DC
  Filled 2022-07-22: qty 120, 30d supply, fill #0

## 2022-08-03 ENCOUNTER — Telehealth: Payer: Self-pay | Admitting: Pharmacist

## 2022-08-03 NOTE — Progress Notes (Signed)
Patient attempted to be outreached by Natalie Elchert, PharmD Candidate on 08/03/2022 to discuss hypertension. Left voicemail for patient to return our call at their convenience at [phone].   Natalie Elchert, PharmD Candidate   Catie T. Kanaya Gunnarson, PharmD, BCACP, CPP [facility] [phone]   

## 2022-08-11 ENCOUNTER — Other Ambulatory Visit: Payer: Self-pay | Admitting: *Deleted

## 2022-08-11 DIAGNOSIS — N186 End stage renal disease: Secondary | ICD-10-CM

## 2022-08-12 ENCOUNTER — Ambulatory Visit: Payer: Medicaid Other | Admitting: Student

## 2022-08-12 ENCOUNTER — Encounter: Payer: Self-pay | Admitting: Student

## 2022-08-12 VITALS — BP 134/75 | HR 84 | Temp 98.2°F | Ht 70.0 in | Wt 187.6 lb

## 2022-08-12 DIAGNOSIS — N185 Chronic kidney disease, stage 5: Secondary | ICD-10-CM | POA: Diagnosis not present

## 2022-08-12 DIAGNOSIS — I130 Hypertensive heart and chronic kidney disease with heart failure and stage 1 through stage 4 chronic kidney disease, or unspecified chronic kidney disease: Secondary | ICD-10-CM

## 2022-08-12 DIAGNOSIS — E1122 Type 2 diabetes mellitus with diabetic chronic kidney disease: Secondary | ICD-10-CM | POA: Diagnosis not present

## 2022-08-12 DIAGNOSIS — I5032 Chronic diastolic (congestive) heart failure: Secondary | ICD-10-CM

## 2022-08-12 DIAGNOSIS — I10 Essential (primary) hypertension: Secondary | ICD-10-CM

## 2022-08-12 DIAGNOSIS — R972 Elevated prostate specific antigen [PSA]: Secondary | ICD-10-CM

## 2022-08-12 DIAGNOSIS — R634 Abnormal weight loss: Secondary | ICD-10-CM | POA: Diagnosis not present

## 2022-08-12 DIAGNOSIS — E119 Type 2 diabetes mellitus without complications: Secondary | ICD-10-CM

## 2022-08-12 LAB — POCT GLYCOSYLATED HEMOGLOBIN (HGB A1C): Hemoglobin A1C: 5.7 % — AB (ref 4.0–5.6)

## 2022-08-12 LAB — GLUCOSE, CAPILLARY: Glucose-Capillary: 139 mg/dL — ABNORMAL HIGH (ref 70–99)

## 2022-08-12 NOTE — Progress Notes (Signed)
CC: A1c f/u  HPI:  [name] a 57 y.o. living with hypertension, HFpEF, CKD 5, type 2 diabetes, who presents to the clinic for A1c recheck and medication refill.  Please see problem based charting for detail  Past Medical History:  Diagnosis Date   Acute exacerbation of CHF (congestive heart failure) (HCC) 12/31/2021   Anxiety    Panic Atacks   Arthritis    Carpal tunnel syndrome    Constipation    Depression    Diabetes mellitus    DMII   Dysrhythmia    hx tachy hr   GERD (gastroesophageal reflux disease)    no problem in past few years.   Hepatitis    Hyperlipidemia    Hypertension    Neuropathy associated with endocrine disorder (HCC)    Seizures (HCC)    "stress related" no more since taking Klonopin 2 years- See Neurologist  [facility]   Shortness of breath dyspnea    with exertion   Sleep apnea    used a cpap when he was 350lb-does not need one now 260lb   Wears contact lenses    Review of Systems:  per HPI  Physical Exam:  Vitals:   08/12/22 1308  BP: 134/75  Pulse: 84  Temp: 98.2 F (36.8 C)  TempSrc: Oral  SpO2: 100%  Weight: 187 lb 9.6 oz (85.1 kg)  Height: 5\' 10"  (1.778 m)   Physical Exam HENT:     Head: Normocephalic.  Eyes:     General:        Right eye: No discharge.        Left eye: No discharge.     Conjunctiva/sclera: Conjunctivae normal.  Cardiovascular:     Rate and Rhythm: Normal rate and regular rhythm.     Comments: No LE edema, no JVD Pulmonary:     Effort: Pulmonary effort is normal. No respiratory distress.     Breath sounds: Normal breath sounds. No wheezing.  Abdominal:     General: Bowel sounds are normal. There is no distension.     Palpations: Abdomen is soft.     Tenderness: There is no abdominal tenderness.  Musculoskeletal:        General: Normal range of motion.     Cervical back: Normal range of motion.  Skin:    General: Skin is warm.  Neurological:     Mental Status: He is alert. Mental  status is at baseline.  Psychiatric:        Mood and Affect: Mood normal.      Assessment & Plan:   See Encounters Tab for problem based charting.  Essential hypertension Blood pressure is well-controlled 130/75.  He reports adherence to Coreg 25 mg twice daily and hydralazine 50 mg 3 times daily.  He could not tolerate amlodipine in the past and limited benefit of ACE/ARB in the setting of CKD 5.  -Continue current regiment -Recheck BMP  (HFpEF) heart failure with preserved ejection fraction (HCC) Patient appears euvolemic on exam without lower extremity edema or JVD.  Lungs are clear to auscultation.  He is on Bumex 4 mg twice daily for diuretics.  He said that his urine output has been decreasing.  He denies lower extremity edema, orthopnea or dyspnea.  -Continue Bumex for diuretics.   -Recheck BMP   CKD (chronic kidney disease) stage 5, GFR less than 15 ml/min (HCC) His electrolytes on the last BMP was stable.  He may have mild uremic symptoms of nausea,  poor p.o. intake, change in taste and brain fogginess. Patient seen a nephrologist and has an appointment with vascular surgery next week to discuss fistula placement.    -Repeat BMP today to evaluate for his electrolytes and BUN.  Type 2 diabetes mellitus (HCC) His A1c is 5.7 today.  Patient has not using his Basaglar for the last 3 weeks due to coverage issue.  He is doing well on Trulicity 1.5 mg weekly.  He however lost around 30 pounds in the last 3 months which accounts for his improved A1c.  -Will stop insulin and hold Trulicity as well to avoid further weight loss. -Close follow-up in 1 month for CBG and weight recheck -Patient has had an ophthalmology visit last month with Dr. Dione Booze  Unintentional weight loss Patient reports significant weight loss due to his poor p.o. intake and exacerbated by starting Trulicity.  He reports chronic nausea with epigastric pain that has been ongoing for many years.  Workup has been  unremarkable with normal EGD, colonoscopy and gastric emptying study.  The only thing that helps with his nausea is marijuana.  He also has change in taste and brain fogginess that has been worsening.  Even though his weight loss can be from poor p.o. intake and GLP-1, I am worried about any underlying malignancies.  Regarding cancer screening, colon cancer was ruled out.  He does have 20-pack-year smoking history so we will get a CT chst abd/epl.  He also endorses lower urinary tract symptoms.  His PSA checked in 2022 was elevated at 8 and 6 without follow-up.  Will recheck PSA.  If elevated, will place a referral to urology.  Will stop Trulicity and follow in 1 month for his weight recheck   Patient discussed with Dr. Heide Spark

## 2022-08-12 NOTE — Progress Notes (Signed)
Internal Medicine Clinic Attending  Case discussed with Dr. Nguyen  At the time of the visit.  We reviewed the resident's history and exam and pertinent patient test results.  I agree with the assessment, diagnosis, and plan of care documented in the resident's note. 

## 2022-08-12 NOTE — Assessment & Plan Note (Addendum)
Patient reports significant weight loss due to his poor p.o. intake and exacerbated by starting Trulicity.  He reports chronic nausea with epigastric pain that has been ongoing for many years.  Workup has been unremarkable with normal EGD, colonoscopy and gastric emptying study.  The only thing that helps with his nausea is marijuana.  He also has change in taste and brain fogginess that has been worsening.  Even though his weight loss can be from poor p.o. intake and GLP-1, I am worried about any underlying malignancies.  Regarding cancer screening, colon cancer was ruled out.  He does have 20-pack-year smoking history so we will get a CT chst abd/epl.  He also endorses lower urinary tract symptoms.  His PSA checked in 2022 was elevated at 8 and 6 without follow-up.  Will recheck PSA.  If elevated, will place a referral to urology.  Will stop Trulicity and follow in 1 month for his weight recheck

## 2022-08-12 NOTE — Assessment & Plan Note (Signed)
His A1c is 5.7 today.  Patient has not using his Basaglar for the last 3 weeks due to coverage issue.  He is doing well on Trulicity 1.5 mg weekly.  He however lost around 30 pounds in the last 3 months which accounts for his improved A1c.  -Will stop insulin and hold Trulicity as well to avoid further weight loss. -Close follow-up in 1 month for CBG and weight recheck -Patient has had an ophthalmology visit last month with Dr. Dione Booze

## 2022-08-12 NOTE — Assessment & Plan Note (Signed)
Patient appears euvolemic on exam without lower extremity edema or JVD.  Lungs are clear to auscultation.  He is on Bumex 4 mg twice daily for diuretics.  He said that his urine output has been decreasing.  He denies lower extremity edema, orthopnea or dyspnea.  -Continue Bumex for diuretics.   -Recheck BMP

## 2022-08-12 NOTE — Assessment & Plan Note (Signed)
Blood pressure is well-controlled 130/75.  He reports adherence to Coreg 25 mg twice daily and hydralazine 50 mg 3 times daily.  He could not tolerate amlodipine in the past and limited benefit of ACE/ARB in the setting of CKD 5.  -Continue current regiment -Recheck BMP

## 2022-08-12 NOTE — Patient Instructions (Signed)
[name],  It was nice seeing you in the clinic today.  Here is a summary what we talked about:  1.  Your blood pressure is well-controlled today.  Please continue Coreg and hydralazine.   2.  Please continue Bumex for diuresis.  3.  Please follow-up with your vascular surgeon as scheduled for fistula placement.  I will recheck your kidney function today.  4.  Please stop using any insulin or Trulicity due to significant weight loss.  I will will obtain a scan of your chest and belly to look for any cause of the weight loss.  I also recheck your prostate numbers today.  Please follow in 1 month for weight recheck  Dr. Cyndie Chime

## 2022-08-12 NOTE — Assessment & Plan Note (Signed)
His electrolytes on the last BMP was stable.  He may have mild uremic symptoms of nausea, poor p.o. intake, change in taste and brain fogginess. Patient seen a nephrologist and has an appointment with vascular surgery next week to discuss fistula placement.    -Repeat BMP today to evaluate for his electrolytes and BUN.

## 2022-08-14 LAB — BMP8+ANION GAP
Anion Gap: 17 mmol/L (ref 10.0–18.0)
BUN/Creatinine Ratio: 14 (ref 9–20)
BUN: 64 mg/dL — ABNORMAL HIGH (ref 6–24)
CO2: 19 mmol/L — ABNORMAL LOW (ref 20–29)
Calcium: 9.3 mg/dL (ref 8.7–10.2)
Chloride: 103 mmol/L (ref 96–106)
Creatinine, Ser: 4.54 mg/dL — ABNORMAL HIGH (ref 0.76–1.27)
Glucose: 131 mg/dL — ABNORMAL HIGH (ref 70–99)
Potassium: 5 mmol/L (ref 3.5–5.2)
Sodium: 139 mmol/L (ref 134–144)
eGFR: 15 mL/min/{1.73_m2} — ABNORMAL LOW (ref >59)

## 2022-08-14 LAB — PSA: Prostate Specific Ag, Serum: 16.4 ng/mL — ABNORMAL HIGH (ref 0.0–4.0)

## 2022-08-14 NOTE — Addendum Note (Signed)
Addended byDoran Stabler on: 08/14/2022 08:56 AM   Modules accepted: Orders

## 2022-08-18 ENCOUNTER — Ambulatory Visit (INDEPENDENT_AMBULATORY_CARE_PROVIDER_SITE_OTHER)
Admission: RE | Admit: 2022-08-18 | Discharge: 2022-08-18 | Disposition: A | Discharge status: Home / Self Care | Payer: Medicaid Other | Source: Ambulatory Visit | Attending: Vascular Surgery | Admitting: Vascular Surgery

## 2022-08-18 ENCOUNTER — Ambulatory Visit (HOSPITAL_COMMUNITY)
Admission: RE | Admit: 2022-08-18 | Discharge: 2022-08-18 | Disposition: A | Discharge status: Home / Self Care | Payer: Medicaid Other | Source: Ambulatory Visit | Attending: Vascular Surgery | Admitting: Vascular Surgery

## 2022-08-18 DIAGNOSIS — N186 End stage renal disease: Secondary | ICD-10-CM

## 2022-09-02 ENCOUNTER — Encounter: Payer: Self-pay | Admitting: Vascular Surgery

## 2022-09-02 ENCOUNTER — Ambulatory Visit (INDEPENDENT_AMBULATORY_CARE_PROVIDER_SITE_OTHER): Payer: Medicaid Other | Admitting: Vascular Surgery

## 2022-09-02 ENCOUNTER — Other Ambulatory Visit: Payer: Self-pay

## 2022-09-02 VITALS — BP 135/76 | HR 69 | Temp 97.9°F | Resp 20 | Ht 70.0 in | Wt 202.5 lb

## 2022-09-02 DIAGNOSIS — N185 Chronic kidney disease, stage 5: Secondary | ICD-10-CM | POA: Diagnosis not present

## 2022-09-02 NOTE — H&P (View-Only) (Signed)
 Patient ID: [name], male   DOB: 05/26/1965, 57 y.o.   MRN: [id number]  Reason for Consult: New Patient (Initial Visit)   Referred by Sridharan, Sriramkumar,*  Subjective:     HPI:  [name] is a 57 y.o. male with chronic kidney disease.  He is right-hand dominant and has never been on dialysis before.  Does not have any previous upper extremity, chest or breast surgeries.  His brother was on dialysis but is now deceased.  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  Past Medical History:  Diagnosis Date   Acute exacerbation of CHF (congestive heart failure) (HCC) 12/31/2021   Anxiety    Panic Atacks   Arthritis    Carpal tunnel syndrome    Chronic kidney disease    Constipation    Depression    Diabetes mellitus    DMII   Dysrhythmia    hx tachy hr   GERD (gastroesophageal reflux disease)    no problem in past few years.   Hepatitis    Hyperlipidemia    Hypertension    Neuropathy associated with endocrine disorder (HCC)    Seizures (HCC)    "stress related" no more since taking Klonopin 2 years- See Neurologist  [facility]   Shortness of breath dyspnea    with exertion   Sleep apnea    used a cpap when he was 350lb-does not need one now 260lb   Wears contact lenses    Family History  Problem Relation Age of Onset   Cancer Mother        pancres   Diabetes Mother    Hypertension Brother    Renal cancer Brother    Cancer Maternal Uncle    Colon cancer Maternal Uncle    Colon cancer Maternal Uncle    Stomach cancer Maternal Uncle    Esophageal cancer Neg Hx    Rectal cancer Neg Hx    Past Surgical History:  Procedure Laterality Date   COLONOSCOPY     KNEE ARTHROSCOPY W/ MENISCAL REPAIR  2000   left   LUMBAR LAMINECTOMY  2002    Short Social History:  Social History   Tobacco Use   Smoking status: Former    Types: Cigarettes    Quit date: 10/27/2008    Years since quitting: 13.8   Smokeless  tobacco: Never   Tobacco comments:    Quit x 10 yrs.  Substance Use Topics   Alcohol use: Not Currently    Comment: Beer sometimes.    Allergies  Allergen Reactions   Norvasc [Amlodipine] Other (See Comments)    Bilateral leg swelling   Zestril [Lisinopril] Swelling and Other (See Comments)    Face and legs swell    Current Outpatient Medications  Medication Sig Dispense Refill   atorvastatin (LIPITOR) 40 MG tablet TAKE 1 TABLET(40 MG) BY MOUTH DAILY 30 tablet 2   bumetanide (BUMEX) 2 MG tablet Take 2 tablets (4 mg total) by mouth 2 (two) times daily. 120 tablet 0   feeding supplement (BOOST HIGH PROTEIN) LIQD Take 1 Container by mouth daily.     hydrALAZINE (APRESOLINE) 50 MG tablet Take 1 tablet (50 mg total) by mouth 3 (three) times daily. 90 tablet 5   Insulin Pen Needle (PEN NEEDLES) 32G X 4 MM MISC 1 Needle by Does not apply route daily at 6 (six) AM. 200 each 1   sertraline (ZOLOFT) 50 MG tablet   Take 1 tablet (50 mg total) by mouth daily. 60 tablet 2   carvedilol (COREG) 25 MG tablet Take 1 tablet (25 mg total) by mouth 2 (two) times daily with a meal. 60 tablet 5   No current facility-administered medications for this visit.    Review of Systems  Constitutional:  Constitutional negative. HENT: HENT negative.  Eyes: Eyes negative.  Respiratory: Respiratory negative.  Cardiovascular: Cardiovascular negative.  GI: Gastrointestinal negative.  Musculoskeletal: Musculoskeletal negative.  Neurological: Positive for numbness.  Hematologic: Hematologic/lymphatic negative.  Psychiatric: Psychiatric negative.        Objective:  Objective   Vitals:   09/02/22 1452  BP: 135/76  Pulse: 69  Resp: 20  Temp: 97.9 F (36.6 C)  SpO2: 95%  Weight: 202 lb 8 oz (91.9 kg)  Height: 5' 10" (1.778 m)   Body mass index is 29.06 kg/m.  Physical Exam HENT:     Head: Normocephalic.     Nose: Nose normal.  Eyes:     Pupils: Pupils are equal, round, and reactive to light.   Cardiovascular:     Rate and Rhythm: Normal rate.     Pulses:          Radial pulses are 2+ on the right side and 2+ on the left side.  Abdominal:     General: Abdomen is flat.  Musculoskeletal:     Right lower leg: No edema.     Left lower leg: No edema.  Skin:    General: Skin is warm.     Capillary Refill: Capillary refill takes less than 2 seconds.  Neurological:     General: No focal deficit present.     Mental Status: He is alert.  Psychiatric:        Mood and Affect: Mood normal.        Thought Content: Thought content normal.        Judgment: Judgment normal.     Data: Right Cephalic   Diameter (cm)Depth (cm)Findings  +-----------------+-------------+----------+--------+  Shoulder            0.28        0.66             +-----------------+-------------+----------+--------+  Prox upper arm       0.35        0.44             +-----------------+-------------+----------+--------+  Mid upper arm        0.29        0.38             +-----------------+-------------+----------+--------+  Dist upper arm       0.28        0.51             +-----------------+-------------+----------+--------+  Antecubital fossa    0.67        0.27             +-----------------+-------------+----------+--------+  Prox forearm         0.24        0.28             +-----------------+-------------+----------+--------+  Mid forearm          0.25        0.22             +-----------------+-------------+----------+--------+  Dist forearm         0.19        0.21             +-----------------+-------------+----------+--------+    Wrist               0.13        0.23             +-----------------+-------------+----------+--------+   +-----------------+-------------+----------+--------------+  Right Basilic    Diameter (cm)Depth (cm)   Findings     +-----------------+-------------+----------+--------------+  Prox upper arm       0.30         0.84                   +-----------------+-------------+----------+--------------+  Mid upper arm        0.28        0.77                   +-----------------+-------------+----------+--------------+  Dist upper arm       0.23        0.73                   +-----------------+-------------+----------+--------------+  Antecubital fossa    0.09        0.81     branching     +-----------------+-------------+----------+--------------+  Prox forearm                            not visualized  +-----------------+-------------+----------+--------------+  Mid forearm                             not visualized  +-----------------+-------------+----------+--------------+  Distal forearm                          not visualized  +-----------------+-------------+----------+--------------+  Elbow                                  not visualized  +-----------------+-------------+----------+--------------+  Wrist                                  not visualized  +-----------------+-------------+----------+--------------+   +-----------------+-------------+----------+--------+  Left Cephalic    Diameter (cm)Depth (cm)Findings  +-----------------+-------------+----------+--------+  Shoulder            0.32        0.59             +-----------------+-------------+----------+--------+  Prox upper arm       0.28        0.53             +-----------------+-------------+----------+--------+  Mid upper arm        0.24        0.29             +-----------------+-------------+----------+--------+  Dist upper arm       0.26        0.32             +-----------------+-------------+----------+--------+  Antecubital fossa    0.24        0.36             +-----------------+-------------+----------+--------+  Prox forearm         0.23        0.41             +-----------------+-------------+----------+--------+  Mid forearm          0.17           0.36             +-----------------+-------------+----------+--------+  Dist forearm         0.11        0.28             +-----------------+-------------+----------+--------+  Wrist               0.19        0.22             +-----------------+-------------+----------+--------+   +-----------------+-------------+----------+--------------+  Left Basilic     Diameter (cm)Depth (cm)   Findings     +-----------------+-------------+----------+--------------+  Prox upper arm       0.15        0.86                   +-----------------+-------------+----------+--------------+  Mid upper arm        0.13        0.81                   +-----------------+-------------+----------+--------------+  Dist upper arm       0.14        0.74     branching     +-----------------+-------------+----------+--------------+  Antecubital fossa                       not visualized  +-----------------+-------------+----------+--------------+  Prox forearm                            not visualized  +-----------------+-------------+----------+--------------+  Mid forearm                             not visualized  +-----------------+-------------+----------+--------------+  Distal forearm                          not visualized  +-----------------+-------------+----------+--------------+  Elbow                                  not visualized  +-----------------+-------------+----------+--------------+   Right Pre-Dialysis Findings:  +-----------------------+----------+--------------------+---------+--------  +  Location              PSV (cm/s)Intralum. Diam. (cm)Waveform  Comments  +-----------------------+----------+--------------------+---------+--------  +  Brachial Antecub. fossa112       0.55                triphasic           +-----------------------+----------+--------------------+---------+--------  +  Radial Art at Wrist     106       0.19                triphasic           +-----------------------+----------+--------------------+---------+--------  +  Ulnar Art at Wrist     112       0.22                triphasic           +-----------------------+----------+--------------------+---------+--------  +      Left Pre-Dialysis Findings:  +-----------------------+----------+--------------------+---------+--------  +  Location              PSV (cm/s)Intralum. Diam. (cm)Waveform  Comments  +-----------------------+----------+--------------------+---------+--------  +  Brachial Antecub. fossa86        0.48                  triphasic           +-----------------------+----------+--------------------+---------+--------  +  Radial Art at Wrist    94        0.24                triphasic           +-----------------------+----------+--------------------+---------+--------  +  Ulnar Art at Wrist     72        0.15                triphasic           +-----------------------+----------+--------------------+---------+--------  +      Assessment/Plan:    56-year-old male with chronic kidney disease stage V now in need of permanent dialysis access.  We have been asked to place a fistula and weight on graft.  He did not appear to have suitable vein on ultrasound but by physical exam he does have a possible cephalic vein in the forearm and I evaluated the cephalic vein with ultrasound at bedside and appears to be suitable on the left side for fistula creation.  I have given the patient the choice to wait to place fistula versus graft until he is closer to dialysis versus proceeding with fistula at this time which may not work and he understands this.  Will get him scheduled for fistula creation in the near future.  All risk benefits and alternatives discussed with patient and he clearly understands and all questions were answered.     Murat Rideout Christopher Dhwani Venkatesh MD [facility]   

## 2022-09-02 NOTE — Progress Notes (Signed)
Patient ID: [name], male   DOB: 1966/01/04, 57 y.o.   MRN: [id number]  Reason for Consult: New Patient (Initial Visit)   Referred by Lyndle Herrlich,*  Subjective:     HPI:  [name] is a 57 y.o. male with chronic kidney disease.  He is right-hand dominant and has never been on dialysis before.  Does not have any previous upper extremity, chest or breast surgeries.  His brother was on dialysis but is now deceased.  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  Past Medical History:  Diagnosis Date   Acute exacerbation of CHF (congestive heart failure) (HCC) 12/31/2021   Anxiety    Panic Atacks   Arthritis    Carpal tunnel syndrome    Chronic kidney disease    Constipation    Depression    Diabetes mellitus    DMII   Dysrhythmia    hx tachy hr   GERD (gastroesophageal reflux disease)    no problem in past few years.   Hepatitis    Hyperlipidemia    Hypertension    Neuropathy associated with endocrine disorder (HCC)    Seizures (HCC)    "stress related" no more since taking Klonopin 2 years- See Neurologist  [facility]   Shortness of breath dyspnea    with exertion   Sleep apnea    used a cpap when he was 350lb-does not need one now 260lb   Wears contact lenses    Family History  Problem Relation Age of Onset   Cancer Mother        pancres   Diabetes Mother    Hypertension Brother    Renal cancer Brother    Cancer Maternal Uncle    Colon cancer Maternal Uncle    Colon cancer Maternal Uncle    Stomach cancer Maternal Uncle    Esophageal cancer Neg Hx    Rectal cancer Neg Hx    Past Surgical History:  Procedure Laterality Date   COLONOSCOPY     KNEE ARTHROSCOPY W/ MENISCAL REPAIR  2000   left   LUMBAR LAMINECTOMY  2002    Short Social History:  Social History   Tobacco Use   Smoking status: Former    Types: Cigarettes    Quit date: 10/27/2008    Years since quitting: 13.8   Smokeless  tobacco: Never   Tobacco comments:    Quit x 10 yrs.  Substance Use Topics   Alcohol use: Not Currently    Comment: Beer sometimes.    Allergies  Allergen Reactions   Norvasc [Amlodipine] Other (See Comments)    Bilateral leg swelling   Zestril [Lisinopril] Swelling and Other (See Comments)    Face and legs swell    Current Outpatient Medications  Medication Sig Dispense Refill   atorvastatin (LIPITOR) 40 MG tablet TAKE 1 TABLET(40 MG) BY MOUTH DAILY 30 tablet 2   bumetanide (BUMEX) 2 MG tablet Take 2 tablets (4 mg total) by mouth 2 (two) times daily. 120 tablet 0   feeding supplement (BOOST HIGH PROTEIN) LIQD Take 1 Container by mouth daily.     hydrALAZINE (APRESOLINE) 50 MG tablet Take 1 tablet (50 mg total) by mouth 3 (three) times daily. 90 tablet 5   Insulin Pen Needle (PEN NEEDLES) 32G X 4 MM MISC 1 Needle by Does not apply route daily at 6 (six) AM. 200 each 1   sertraline (ZOLOFT) 50 MG tablet  Take 1 tablet (50 mg total) by mouth daily. 60 tablet 2   carvedilol (COREG) 25 MG tablet Take 1 tablet (25 mg total) by mouth 2 (two) times daily with a meal. 60 tablet 5   No current facility-administered medications for this visit.    Review of Systems  Constitutional:  Constitutional negative. HENT: HENT negative.  Eyes: Eyes negative.  Respiratory: Respiratory negative.  Cardiovascular: Cardiovascular negative.  GI: Gastrointestinal negative.  Musculoskeletal: Musculoskeletal negative.  Neurological: Positive for numbness.  Hematologic: Hematologic/lymphatic negative.  Psychiatric: Psychiatric negative.        Objective:  Objective   Vitals:   09/02/22 1452  BP: 135/76  Pulse: 69  Resp: 20  Temp: 97.9 F (36.6 C)  SpO2: 95%  Weight: 202 lb 8 oz (91.9 kg)  Height: 5\' 10"  (1.778 m)   Body mass index is 29.06 kg/m.  Physical Exam HENT:     Head: Normocephalic.     Nose: Nose normal.  Eyes:     Pupils: Pupils are equal, round, and reactive to light.   Cardiovascular:     Rate and Rhythm: Normal rate.     Pulses:          Radial pulses are 2+ on the right side and 2+ on the left side.  Abdominal:     General: Abdomen is flat.  Musculoskeletal:     Right lower leg: No edema.     Left lower leg: No edema.  Skin:    General: Skin is warm.     Capillary Refill: Capillary refill takes less than 2 seconds.  Neurological:     General: No focal deficit present.     Mental Status: He is alert.  Psychiatric:        Mood and Affect: Mood normal.        Thought Content: Thought content normal.        Judgment: Judgment normal.     Data: Right Cephalic   Diameter (cm)Depth (cm)Findings  +-----------------+-------------+----------+--------+  Shoulder            0.28        0.66             +-----------------+-------------+----------+--------+  Prox upper arm       0.35        0.44             +-----------------+-------------+----------+--------+  Mid upper arm        0.29        0.38             +-----------------+-------------+----------+--------+  Dist upper arm       0.28        0.51             +-----------------+-------------+----------+--------+  Antecubital fossa    0.67        0.27             +-----------------+-------------+----------+--------+  Prox forearm         0.24        0.28             +-----------------+-------------+----------+--------+  Mid forearm          0.25        0.22             +-----------------+-------------+----------+--------+  Dist forearm         0.19        0.21             +-----------------+-------------+----------+--------+  Wrist               0.13        0.23             +-----------------+-------------+----------+--------+   +-----------------+-------------+----------+--------------+  Right Basilic    Diameter (cm)Depth (cm)   Findings     +-----------------+-------------+----------+--------------+  Prox upper arm       0.30         0.84                   +-----------------+-------------+----------+--------------+  Mid upper arm        0.28        0.77                   +-----------------+-------------+----------+--------------+  Dist upper arm       0.23        0.73                   +-----------------+-------------+----------+--------------+  Antecubital fossa    0.09        0.81     branching     +-----------------+-------------+----------+--------------+  Prox forearm                            not visualized  +-----------------+-------------+----------+--------------+  Mid forearm                             not visualized  +-----------------+-------------+----------+--------------+  Distal forearm                          not visualized  +-----------------+-------------+----------+--------------+  Elbow                                  not visualized  +-----------------+-------------+----------+--------------+  Wrist                                  not visualized  +-----------------+-------------+----------+--------------+   +-----------------+-------------+----------+--------+  Left Cephalic    Diameter (cm)Depth (cm)Findings  +-----------------+-------------+----------+--------+  Shoulder            0.32        0.59             +-----------------+-------------+----------+--------+  Prox upper arm       0.28        0.53             +-----------------+-------------+----------+--------+  Mid upper arm        0.24        0.29             +-----------------+-------------+----------+--------+  Dist upper arm       0.26        0.32             +-----------------+-------------+----------+--------+  Antecubital fossa    0.24        0.36             +-----------------+-------------+----------+--------+  Prox forearm         0.23        0.41             +-----------------+-------------+----------+--------+  Mid forearm          0.17  0.36             +-----------------+-------------+----------+--------+  Dist forearm         0.11        0.28             +-----------------+-------------+----------+--------+  Wrist               0.19        0.22             +-----------------+-------------+----------+--------+   +-----------------+-------------+----------+--------------+  Left Basilic     Diameter (cm)Depth (cm)   Findings     +-----------------+-------------+----------+--------------+  Prox upper arm       0.15        0.86                   +-----------------+-------------+----------+--------------+  Mid upper arm        0.13        0.81                   +-----------------+-------------+----------+--------------+  Dist upper arm       0.14        0.74     branching     +-----------------+-------------+----------+--------------+  Antecubital fossa                       not visualized  +-----------------+-------------+----------+--------------+  Prox forearm                            not visualized  +-----------------+-------------+----------+--------------+  Mid forearm                             not visualized  +-----------------+-------------+----------+--------------+  Distal forearm                          not visualized  +-----------------+-------------+----------+--------------+  Elbow                                  not visualized  +-----------------+-------------+----------+--------------+   Right Pre-Dialysis Findings:  +-----------------------+----------+--------------------+---------+--------  +  Location              PSV (cm/s)Intralum. Diam. (cm)Waveform  Comments  +-----------------------+----------+--------------------+---------+--------  +  Brachial Antecub. fossa112       0.55                triphasic           +-----------------------+----------+--------------------+---------+--------  +  Radial Art at Wrist     106       0.19                triphasic           +-----------------------+----------+--------------------+---------+--------  +  Ulnar Art at Wrist     112       0.22                triphasic           +-----------------------+----------+--------------------+---------+--------  +      Left Pre-Dialysis Findings:  +-----------------------+----------+--------------------+---------+--------  +  Location              PSV (cm/s)Intralum. Diam. (cm)Waveform  Comments  +-----------------------+----------+--------------------+---------+--------  +  Brachial Antecub. fossa86        0.48  triphasic           +-----------------------+----------+--------------------+---------+--------  +  Radial Art at Wrist    94        0.24                triphasic           +-----------------------+----------+--------------------+---------+--------  +  Ulnar Art at Wrist     72        0.15                triphasic           +-----------------------+----------+--------------------+---------+--------  +      Assessment/Plan:    57 year old male with chronic kidney disease stage V now in need of permanent dialysis access.  We have been asked to place a fistula and weight on graft.  He did not appear to have suitable vein on ultrasound but by physical exam he does have a possible cephalic vein in the forearm and I evaluated the cephalic vein with ultrasound at bedside and appears to be suitable on the left side for fistula creation.  I have given the patient the choice to wait to place fistula versus graft until he is closer to dialysis versus proceeding with fistula at this time which may not work and he understands this.  Will get him scheduled for fistula creation in the near future.  All risk benefits and alternatives discussed with patient and he clearly understands and all questions were answered.     Maeola Harman MD [facility]

## 2022-09-03 ENCOUNTER — Other Ambulatory Visit (HOSPITAL_COMMUNITY): Payer: Self-pay

## 2022-09-03 ENCOUNTER — Other Ambulatory Visit: Payer: Self-pay

## 2022-09-03 MED ORDER — BUMETANIDE 2 MG PO TABS
4.0000 mg | ORAL_TABLET | Freq: Two times a day (BID) | ORAL | 0 refills | Status: DC
  Filled 2022-09-03 – 2022-09-28 (×2): qty 120, 30d supply, fill #0

## 2022-09-04 ENCOUNTER — Other Ambulatory Visit (HOSPITAL_COMMUNITY): Payer: Self-pay

## 2022-09-07 ENCOUNTER — Other Ambulatory Visit: Payer: Self-pay

## 2022-09-07 ENCOUNTER — Other Ambulatory Visit: Payer: Self-pay | Admitting: Urology

## 2022-09-07 DIAGNOSIS — N185 Chronic kidney disease, stage 5: Secondary | ICD-10-CM

## 2022-09-07 DIAGNOSIS — R972 Elevated prostate specific antigen [PSA]: Secondary | ICD-10-CM

## 2022-09-08 ENCOUNTER — Encounter: Payer: Self-pay | Admitting: *Deleted

## 2022-09-09 ENCOUNTER — Other Ambulatory Visit: Payer: Self-pay

## 2022-09-09 ENCOUNTER — Encounter (HOSPITAL_COMMUNITY): Payer: Self-pay | Admitting: Vascular Surgery

## 2022-09-09 NOTE — Progress Notes (Signed)
SDW call  Patient was given pre-op instructions over the phone. Patient verbalized understanding of instructions provided.    PCP - [facility] Cardiologist - denies Pulmonary: denies   PPM/ICD - denies   Chest x-ray - 12/31/2021 EKG -  12/31/2021 Stress Test - ECHO - 12/18/2021 Cardiac Cath -   Sleep Study/sleep apnea/CPAP: Diagnosed with sleep apnea.  Patient has lost 150 pounds and no longer needs CPAP  Type II diabetic.  Last A1C was 5.5, and patient was taken off all his diabetic medications Fasting Blood sugar range: 100-140 How often check sugars: daily   Blood Thinner Instructions: denies Aspirin Instructions:denies   ERAS Protcol - No, NPO PRE-SURGERY Ensure or G2-    COVID TEST- n/a    Anesthesia review: Yes.  CHF, HTN, DM, sleep apnea, high cholesterol   Patient denies shortness of breath, fever, cough and chest pain over the phone call  Your procedure is scheduled on Friday September 11, 2022  Report to [facility] Main Entrance "A" at 0830   A.M., then check in with the Admitting office.  Call this number if you have problems the morning of surgery:  [phone]   If you have any questions prior to your surgery date call [phone]: Open Monday-Friday 8am-4pm If you experience any cold or flu symptoms such as cough, fever, chills, shortness of breath, etc. between now and your scheduled surgery, please notify us at the above number    Remember:  Do not eat or drink after midnight the night before your surgery  Take these medicines the morning of surgery with A SIP OF WATER:  Atorvastatin, carvedilol, hydralazine, zoloft  As of today, STOP taking any Aspirin (unless otherwise instructed by your surgeon) Aleve, Naproxen, Ibuprofen, Motrin, Advil, Goody's, BC's, all herbal medications, fish oil, and all vitamins.

## 2022-09-11 ENCOUNTER — Ambulatory Visit (HOSPITAL_COMMUNITY): Payer: Medicaid Other | Admitting: Physician Assistant

## 2022-09-11 ENCOUNTER — Encounter (HOSPITAL_COMMUNITY)
Admission: RE | Disposition: A | Discharge status: Home / Self Care | Payer: Self-pay | Source: Home / Self Care | Attending: Vascular Surgery

## 2022-09-11 ENCOUNTER — Ambulatory Visit: Payer: Medicaid Other | Admitting: Podiatry

## 2022-09-11 ENCOUNTER — Ambulatory Visit (HOSPITAL_COMMUNITY)
Admission: RE | Admit: 2022-09-11 | Discharge: 2022-09-11 | Disposition: A | Discharge status: Home / Self Care | Payer: Medicaid Other | Attending: Vascular Surgery | Admitting: Vascular Surgery

## 2022-09-11 DIAGNOSIS — N185 Chronic kidney disease, stage 5: Secondary | ICD-10-CM | POA: Insufficient documentation

## 2022-09-11 DIAGNOSIS — I120 Hypertensive chronic kidney disease with stage 5 chronic kidney disease or end stage renal disease: Secondary | ICD-10-CM | POA: Diagnosis not present

## 2022-09-11 DIAGNOSIS — E1122 Type 2 diabetes mellitus with diabetic chronic kidney disease: Secondary | ICD-10-CM | POA: Diagnosis not present

## 2022-09-11 DIAGNOSIS — Z09 Encounter for follow-up examination after completed treatment for conditions other than malignant neoplasm: Secondary | ICD-10-CM | POA: Diagnosis not present

## 2022-09-11 DIAGNOSIS — N189 Chronic kidney disease, unspecified: Secondary | ICD-10-CM | POA: Diagnosis present

## 2022-09-11 DIAGNOSIS — Z87891 Personal history of nicotine dependence: Secondary | ICD-10-CM

## 2022-09-11 DIAGNOSIS — E11610 Type 2 diabetes mellitus with diabetic neuropathic arthropathy: Secondary | ICD-10-CM | POA: Insufficient documentation

## 2022-09-11 DIAGNOSIS — G4730 Sleep apnea, unspecified: Secondary | ICD-10-CM | POA: Insufficient documentation

## 2022-09-11 DIAGNOSIS — Z992 Dependence on renal dialysis: Secondary | ICD-10-CM | POA: Diagnosis not present

## 2022-09-11 DIAGNOSIS — N186 End stage renal disease: Secondary | ICD-10-CM | POA: Diagnosis not present

## 2022-09-11 HISTORY — PX: AV FISTULA PLACEMENT: SHX1204

## 2022-09-11 LAB — POCT I-STAT, CHEM 8
BUN: 57 mg/dL — ABNORMAL HIGH (ref 6–20)
Calcium, Ion: 1.02 mmol/L — ABNORMAL LOW (ref 1.15–1.40)
Chloride: 107 mmol/L (ref 98–111)
Creatinine, Ser: 4.7 mg/dL — ABNORMAL HIGH (ref 0.61–1.24)
Glucose, Bld: 180 mg/dL — ABNORMAL HIGH (ref 70–99)
HCT: 27 % — ABNORMAL LOW (ref 39.0–52.0)
Hemoglobin: 9.2 g/dL — ABNORMAL LOW (ref 13.0–17.0)
Potassium: 4.9 mmol/L (ref 3.5–5.1)
Sodium: 137 mmol/L (ref 135–145)
TCO2: 23 mmol/L (ref 22–32)

## 2022-09-11 LAB — GLUCOSE, CAPILLARY
Glucose-Capillary: 172 mg/dL — ABNORMAL HIGH (ref 70–99)
Glucose-Capillary: 129 mg/dL — ABNORMAL HIGH (ref 70–99)
Glucose-Capillary: 161 mg/dL — ABNORMAL HIGH (ref 70–99)

## 2022-09-11 SURGERY — ARTERIOVENOUS (AV) FISTULA CREATION
Anesthesia: Monitor Anesthesia Care | Laterality: Left

## 2022-09-11 MED ORDER — FENTANYL CITRATE (PF) 100 MCG/2ML IJ SOLN
INTRAMUSCULAR | Status: AC
  Administered 2022-09-11: 100 ug
  Filled 2022-09-11: qty 2

## 2022-09-11 MED ORDER — PROPOFOL 500 MG/50ML IV EMUL
INTRAVENOUS | Status: DC | PRN
  Administered 2022-09-11: 75 ug/kg/min via INTRAVENOUS

## 2022-09-11 MED ORDER — FENTANYL CITRATE (PF) 100 MCG/2ML IJ SOLN: 25.0000 ug | INTRAMUSCULAR | Status: DC | PRN

## 2022-09-11 MED ORDER — ONDANSETRON HCL 4 MG/2ML IJ SOLN: 4.0000 mg | Freq: Once | INTRAMUSCULAR | Status: DC | PRN

## 2022-09-11 MED ORDER — CHLORHEXIDINE GLUCONATE 0.12 % MT SOLN
15.0000 mL | Freq: Once | OROMUCOSAL | Status: AC
  Administered 2022-09-11: 15 mL via OROMUCOSAL
  Filled 2022-09-11: qty 15

## 2022-09-11 MED ORDER — LIDOCAINE-EPINEPHRINE (PF) 1.5 %-1:200000 IJ SOLN
INTRAMUSCULAR | Status: DC | PRN
  Administered 2022-09-11: 20 mL via PERINEURAL

## 2022-09-11 MED ORDER — SODIUM CHLORIDE 0.9 % IV SOLN: INTRAVENOUS | Status: DC

## 2022-09-11 MED ORDER — INSULIN ASPART 100 UNIT/ML IJ SOLN
INTRAMUSCULAR | Status: AC
  Filled 2022-09-11: qty 1

## 2022-09-11 MED ORDER — PROPOFOL 10 MG/ML IV BOLUS
INTRAVENOUS | Status: AC
  Filled 2022-09-11: qty 20

## 2022-09-11 MED ORDER — CHLORHEXIDINE GLUCONATE 4 % EX SOLN: 60.0000 mL | Freq: Once | CUTANEOUS | Status: DC

## 2022-09-11 MED ORDER — 0.9 % SODIUM CHLORIDE (POUR BTL) OPTIME
TOPICAL | Status: DC | PRN
  Administered 2022-09-11: 1000 mL

## 2022-09-11 MED ORDER — HEPARIN 6000 UNIT IRRIGATION SOLUTION
Status: AC
  Filled 2022-09-11: qty 500

## 2022-09-11 MED ORDER — HYDRALAZINE HCL 20 MG/ML IJ SOLN
5.0000 mg | Freq: Once | INTRAMUSCULAR | Status: AC
  Administered 2022-09-11: 5 mg via INTRAVENOUS

## 2022-09-11 MED ORDER — INSULIN ASPART 100 UNIT/ML IJ SOLN
0.0000 [IU] | INTRAMUSCULAR | Status: DC | PRN
  Administered 2022-09-11: 2 [IU] via SUBCUTANEOUS

## 2022-09-11 MED ORDER — CEFAZOLIN SODIUM-DEXTROSE 2-4 GM/100ML-% IV SOLN
2.0000 g | INTRAVENOUS | Status: AC
  Administered 2022-09-11: 2 g via INTRAVENOUS
  Filled 2022-09-11: qty 100

## 2022-09-11 MED ORDER — OXYCODONE HCL 5 MG PO TABS: 5.0000 mg | ORAL_TABLET | Freq: Once | ORAL | Status: DC | PRN

## 2022-09-11 MED ORDER — HEPARIN 6000 UNIT IRRIGATION SOLUTION
Status: DC | PRN
  Administered 2022-09-11: 1

## 2022-09-11 MED ORDER — MEPIVACAINE HCL (PF) 2 % IJ SOLN
INTRAMUSCULAR | Status: DC | PRN
  Administered 2022-09-11: 10 mL

## 2022-09-11 MED ORDER — OXYCODONE HCL 5 MG PO TABS: 5.0000 mg | ORAL_TABLET | ORAL | 0 refills | Status: DC | PRN

## 2022-09-11 MED ORDER — ONDANSETRON HCL 4 MG/2ML IJ SOLN
4.0000 mg | Freq: Once | INTRAMUSCULAR | Status: AC
  Administered 2022-09-11: 4 mg via INTRAVENOUS

## 2022-09-11 MED ORDER — ONDANSETRON HCL 4 MG/2ML IJ SOLN
INTRAMUSCULAR | Status: AC
  Filled 2022-09-11: qty 2

## 2022-09-11 MED ORDER — MIDAZOLAM HCL 2 MG/2ML IJ SOLN: 2.0000 mg | Freq: Once | INTRAMUSCULAR | Status: AC

## 2022-09-11 MED ORDER — HYDRALAZINE HCL 20 MG/ML IJ SOLN
INTRAMUSCULAR | Status: AC
  Filled 2022-09-11: qty 1

## 2022-09-11 MED ORDER — PROPOFOL 10 MG/ML IV BOLUS
INTRAVENOUS | Status: DC | PRN
  Administered 2022-09-11: 30 mg via INTRAVENOUS

## 2022-09-11 MED ORDER — FENTANYL CITRATE (PF) 100 MCG/2ML IJ SOLN: 50.0000 ug | Freq: Once | INTRAMUSCULAR | Status: AC

## 2022-09-11 MED ORDER — MIDAZOLAM HCL 2 MG/2ML IJ SOLN
INTRAMUSCULAR | Status: AC
  Administered 2022-09-11: 2 mg via INTRAVENOUS
  Filled 2022-09-11: qty 2

## 2022-09-11 MED ORDER — OXYCODONE HCL 5 MG/5ML PO SOLN: 5.0000 mg | Freq: Once | ORAL | Status: DC | PRN

## 2022-09-11 MED ORDER — ORAL CARE MOUTH RINSE: 15.0000 mL | Freq: Once | OROMUCOSAL | Status: AC

## 2022-09-11 SURGICAL SUPPLY — 33 items
ADH SKN CLS APL DERMABOND .7 (GAUZE/BANDAGES/DRESSINGS) ×1
ARMBAND PINK RESTRICT EXTREMIT (MISCELLANEOUS) ×1 IMPLANT
BAG COUNTER SPONGE SURGICOUNT (BAG) ×1 IMPLANT
BAG SPNG CNTER NS LX DISP (BAG) ×1
CANISTER SUCT 3000ML PPV (MISCELLANEOUS) ×1 IMPLANT
CLIP LIGATING EXTRA MED SLVR (CLIP) ×1 IMPLANT
CLIP LIGATING EXTRA SM BLUE (MISCELLANEOUS) ×1 IMPLANT
COVER PROBE W GEL 5X96 (DRAPES) IMPLANT
DERMABOND ADVANCED .7 DNX12 (GAUZE/BANDAGES/DRESSINGS) ×1 IMPLANT
DRAPE IMP U-DRAPE 54X76 (DRAPES) IMPLANT
ELECT REM PT RETURN 9FT ADLT (ELECTROSURGICAL) ×1
ELECTRODE REM PT RTRN 9FT ADLT (ELECTROSURGICAL) ×1 IMPLANT
GLOVE BIO SURGEON STRL SZ7.5 (GLOVE) ×1 IMPLANT
GOWN STRL REUS W/ TWL LRG LVL3 (GOWN DISPOSABLE) ×2 IMPLANT
GOWN STRL REUS W/ TWL XL LVL3 (GOWN DISPOSABLE) ×1 IMPLANT
GOWN STRL REUS W/TWL LRG LVL3 (GOWN DISPOSABLE) ×2
GOWN STRL REUS W/TWL XL LVL3 (GOWN DISPOSABLE) ×1
INSERT FOGARTY SM (MISCELLANEOUS) IMPLANT
KIT BASIN OR (CUSTOM PROCEDURE TRAY) ×1 IMPLANT
KIT TURNOVER KIT B (KITS) ×1 IMPLANT
NS IRRIG 1000ML POUR BTL (IV SOLUTION) ×1 IMPLANT
PACK CV ACCESS (CUSTOM PROCEDURE TRAY) ×1 IMPLANT
PAD ARMBOARD 7.5X6 YLW CONV (MISCELLANEOUS) ×2 IMPLANT
POWDER SURGICEL 3.0 GRAM (HEMOSTASIS) IMPLANT
SLING ARM FOAM STRAP LRG (SOFTGOODS) IMPLANT
SLING ARM FOAM STRAP MED (SOFTGOODS) IMPLANT
SUT MNCRL AB 4-0 PS2 18 (SUTURE) ×1 IMPLANT
SUT PROLENE 6 0 BV (SUTURE) ×1 IMPLANT
SUT VIC AB 3-0 SH 27 (SUTURE) ×1
SUT VIC AB 3-0 SH 27X BRD (SUTURE) ×1 IMPLANT
TOWEL GREEN STERILE (TOWEL DISPOSABLE) ×1 IMPLANT
UNDERPAD 30X36 HEAVY ABSORB (UNDERPADS AND DIAPERS) ×1 IMPLANT
WATER STERILE IRR 1000ML POUR (IV SOLUTION) ×1 IMPLANT

## 2022-09-11 NOTE — Op Note (Signed)
    Patient name: [name] MRN: [id number] DOB: 09-08-1965 Sex: male  09/11/2022 Pre-operative Diagnosis: Chronic kidney disease stage V Post-operative diagnosis:  Same Surgeon:  Luanna Salk. Randie Heinz, MD Assistant: Loel Dubonnet, PA Procedure Performed:   Left brachial artery to cephalic vein AV fistula creation  Indications: 57 year old male with chronic kidney disease now indicated for permanent dialysis access.  He has marginal cephalic vein on the left we have evaluated with ultrasound at the bedside in the office and have elected to proceed with fistula and given his decreased renal function would likely place a graft at the time of surgery if necessary.  All risk benefits alternatives have been discussed with the patient he demonstrates good understanding and agrees to proceed.  Given the complexity of the case,  the assistant was necessary in order to expedient the procedure and safely perform the technical aspects of the operation.  The assistant provided traction and countertraction to assist with exposure of the artery and vein.  They also assisted with suture ligation of multiple venous branches.  They played a critical role in the anastomosis. These skills, especially following the Prolene suture for the anastomosis, could not have been adequately performed by a scrub tech assistant.    Findings: Cephalic vein above the antecubital was quite large at the antecubital was quite a bit smaller but was easily dilated to 4 mm.  The brachial artery was healthy and large and at completion there was a very strong thrill in the fistula traced with Doppler and a radial artery signal at the wrist that did augment with compression of the fistula.   Procedure:  The patient was identified in the holding area and taken to the operating was fully supine operative table and MAC anesthesia was induced.  He was sterilely prepped and draped in the left upper extremity usual fashion, antibiotics were  administered a timeout was called.  A preoperative block of been placed this was checked and noted to be intact.  Transverse incision was created between the cephalic vein which had been identified on ultrasound and the brachial artery pulse.  We dissected out the vein for several centimeters marked this for orientation divided branches between clips and ties.  We dissected to the deep fascia the brachial artery placed a vessel loop around this.  The vein was then transected distally and tied off and flushed with heparinized saline and then serially dilated to 4 mm and again flushed and clamped.  The artery was clamped distally and proximally opened longitudinally and flushed with heparinized saline and distal direction.  The vein was spatulated sewn into side with 6-0 Prolene suture.  Prior completion without flushing all directions.  There was a very strong thrill upon completion of the fistula this could be traced with Doppler of the upper arm we did free up some soft tissue using cautery which allowed the fistula to sit nicely.  We then irrigated the wound obtaining stasis and closed in layers with Vicryl Monocryl.  Dermabond was placed at the skin level.  The patient was awakened from anesthesia having tolerated procedure without any complication.  Counts were correct at completion.  EBL: 20 cc   Kia Stavros C. Randie Heinz, MD [facility] [place] Office: [phone] Pager: [phone]

## 2022-09-11 NOTE — Anesthesia Preprocedure Evaluation (Signed)
Anesthesia Evaluation  Patient identified by MRN, date of birth, ID band Patient awake    Reviewed: Allergy & Precautions, H&P , NPO status , Patient's Chart, lab work & pertinent test results  Airway Mallampati: II  TM Distance: >3 FB Neck ROM: Full    Dental no notable dental hx.    Pulmonary sleep apnea and Continuous Positive Airway Pressure Ventilation , former smoker   Pulmonary exam normal breath sounds clear to auscultation       Cardiovascular hypertension, Pt. on medications Normal cardiovascular exam Rhythm:Regular Rate:Normal  1. Left ventricular ejection fraction, by estimation, is 60 to 65%. The  left ventricle has normal function. The left ventricle has no regional  wall motion abnormalities. There is mild left ventricular hypertrophy.  Left ventricular diastolic parameters  are consistent with Grade I diastolic dysfunction (impaired relaxation).  Elevated left ventricular end-diastolic pressure. The E/e' is 16.   2. Right ventricular systolic function is low normal. The right  ventricular size is normal. Tricuspid regurgitation signal is inadequate  for assessing PA pressure.   3. Left atrial size was mildly dilated.   4. The mitral valve is normal in structure. No evidence of mitral valve  regurgitation.   5. The aortic valve is tricuspid. Aortic valve regurgitation is not  visualized.   6. The inferior vena cava is normal in size with greater than 50%  respiratory variability, suggesting right atrial pressure of 3 mmHg.     Neuro/Psych Seizures -, Well Controlled,   negative psych ROS   GI/Hepatic negative GI ROS,,,(+) Hepatitis -, C  Endo/Other  negative endocrine ROSdiabetes    Renal/GU ESRFRenal disease  negative genitourinary   Musculoskeletal negative musculoskeletal ROS (+)    Abdominal   Peds negative pediatric ROS (+)  Hematology negative hematology ROS (+)   Anesthesia Other  Findings   Reproductive/Obstetrics negative OB ROS                             Anesthesia Physical Anesthesia Plan  ASA: 3  Anesthesia Plan: MAC   Post-op Pain Management: Minimal or no pain anticipated and Regional block*   Induction: Intravenous  PONV Risk Score and Plan: 1 and Propofol infusion and Treatment may vary due to age or medical condition  Airway Management Planned: Simple Face Mask  Additional Equipment:   Intra-op Plan:   Post-operative Plan:   Informed Consent: I have reviewed the patients History and Physical, chart, labs and discussed the procedure including the risks, benefits and alternatives for the proposed anesthesia with the patient or authorized representative who has indicated his/her understanding and acceptance.     Dental advisory given  Plan Discussed with: CRNA and Surgeon  Anesthesia Plan Comments:        Anesthesia Quick Evaluation

## 2022-09-11 NOTE — Interval H&P Note (Signed)
History and Physical Interval Note:  09/11/2022 9:48 AM  [name]  has presented today for surgery, with the diagnosis of Chronic Kidney Disease Stage V.  The various methods of treatment have been discussed with the patient and family. After consideration of risks, benefits and other options for treatment, the patient has consented to  Procedure(s): LEFT ARM ARTERIOVENOUS (AV) FISTULA CREATION VERSUS GRAFT (Left) as a surgical intervention.  The patient's history has been reviewed, patient examined, no change in status, stable for surgery.  I have reviewed the patient's chart and labs.  Questions were answered to the patient's satisfaction.     Lemar Livings

## 2022-09-11 NOTE — Anesthesia Procedure Notes (Signed)
Anesthesia Procedure Image    

## 2022-09-11 NOTE — Anesthesia Procedure Notes (Signed)
Anesthesia Regional Block: Supraclavicular block   Pre-Anesthetic Checklist: , timeout performed,  Correct Patient, Correct Site, Correct Laterality,  Correct Procedure, Correct Position, site marked,  Risks and benefits discussed,  Surgical consent,  Pre-op evaluation,  At surgeon's request and post-op pain management  Laterality: Left  Prep: chloraprep       Needles:  Injection technique: Single-shot  Needle Type: Echogenic Needle     Needle Length: 9cm      Additional Needles:   Procedures:,,,, ultrasound used (permanent image in chart),,    Narrative:  Start time: 09/11/2022 9:40 AM End time: 09/11/2022 9:50 AM Injection made incrementally with aspirations every 5 mL.  Performed by: Personally  Anesthesiologist: Eilene Ghazi, MD  Additional Notes: Patient tolerated the procedure well without complications

## 2022-09-11 NOTE — Progress Notes (Signed)
Patient's B/P elevated and Dr. Okey Dupre aware.  Will continue to monitor.

## 2022-09-11 NOTE — Discharge Instructions (Signed)
[facility]  Discharge Instructions  AV Fistula or Graft Surgery for Dialysis Access  Please refer to the following instructions for your post-procedure care. Your surgeon or physician assistant will discuss any changes with you.  Activity  You may drive the day following your surgery, if you are comfortable and no longer taking prescription pain medication. Resume full activity as the soreness in your incision resolves.  Bathing/Showering  You may shower after you go home. Keep your incision dry for 48 hours. Do not soak in a bathtub, hot tub, or swim until the incision heals completely. You may not shower if you have a hemodialysis catheter.  Incision Care  Clean your incision with mild soap and water after 48 hours. Pat the area dry with a clean towel. You do not need a bandage unless otherwise instructed. Do not apply any ointments or creams to your incision. You may have skin glue on your incision. Do not peel it off. It will come off on its own in about one week. Your arm may swell a bit after surgery. To reduce swelling use pillows to elevate your arm so it is above your heart. Your doctor will tell you if you need to lightly wrap your arm with an ACE bandage.  Diet  Resume your normal diet. There are not special food restrictions following this procedure. In order to heal from your surgery, it is CRITICAL to get adequate nutrition. Your body requires vitamins, minerals, and protein. Vegetables are the best source of vitamins and minerals. Vegetables also provide the perfect balance of protein. Processed food has little nutritional value, so try to avoid this.  Medications  Resume taking all of your medications. If your incision is causing pain, you may take over-the counter pain relievers such as acetaminophen (Tylenol). If you were prescribed a stronger pain medication, please be aware these medications can cause nausea and constipation. Prevent  nausea by taking the medication with a snack or meal. Avoid constipation by drinking plenty of fluids and eating foods with high amount of fiber, such as fruits, vegetables, and grains.  Do not take Tylenol if you are taking prescription pain medications.  Follow up Your surgeon may want to see you in the office following your access surgery. If so, this will be arranged at the time of your surgery.  Please call us immediately for any of the following conditions:  Increased pain, redness, drainage (pus) from your incision site Fever of 101 degrees or higher Severe or worsening pain at your incision site Hand pain or numbness.  Reduce your risk of vascular disease:  Stop smoking. If you would like help, call [facility] at 1-800-QUIT-NOW (307-305-7065) or [place] at 928-406-8459  Manage your cholesterol Maintain a desired weight Control your diabetes Keep your blood pressure down  Dialysis  It will take several weeks to several months for your new dialysis access to be ready for use. Your surgeon will determine when it is okay to use it. Your nephrologist will continue to direct your dialysis. You can continue to use your Permcath until your new access is ready for use.   09/11/2022 [name] [id number] 03-Jul-1965  Surgeon(s): Maeola Harman, MD  Procedure(s): LEFT ARM BRACHIOCEPHALIC ARTERIOVENOUS (AV) FISTULA CREATION   May stick graft immediately   May stick graft on designated area only:   x Do not stick fistula for 12 weeks    If you have any questions, please call the office at  [phone].  

## 2022-09-11 NOTE — Transfer of Care (Signed)
Immediate Anesthesia Transfer of Care Note  Patient: [name]  Procedure(s) Performed: LEFT ARM BRACHIOCEPHALIC ARTERIOVENOUS (AV) FISTULA CREATION (Left)  Patient Location: PACU  Anesthesia Type:MAC  Level of Consciousness: awake, alert , and oriented  Airway & Oxygen Therapy: Patient Spontanous Breathing  Post-op Assessment: Report given to RN and Post -op Vital signs reviewed and stable  Post vital signs: Reviewed and stable  Last Vitals:  Vitals Value Taken Time  BP 205/93 09/11/22 1118  Temp    Pulse 82 09/11/22 1121  Resp 17 09/11/22 1121  SpO2 98 % 09/11/22 1121  Vitals shown include unvalidated device data.  Last Pain:  Vitals:   09/11/22 0919  TempSrc:   PainSc: 5        Will be treating post op blood pressure  Complications: No notable events documented.

## 2022-09-12 ENCOUNTER — Other Ambulatory Visit: Payer: Self-pay

## 2022-09-12 ENCOUNTER — Encounter (HOSPITAL_COMMUNITY): Payer: Self-pay | Admitting: Vascular Surgery

## 2022-09-13 NOTE — Anesthesia Postprocedure Evaluation (Signed)
Anesthesia Post Note  Patient: [name]  Procedure(s) Performed: LEFT ARM BRACHIOCEPHALIC ARTERIOVENOUS (AV) FISTULA CREATION (Left)     Patient location during evaluation: PACU Anesthesia Type: MAC and Regional Level of consciousness: awake and alert Pain management: pain level controlled Vital Signs Assessment: post-procedure vital signs reviewed and stable Respiratory status: spontaneous breathing, nonlabored ventilation, respiratory function stable and patient connected to nasal cannula oxygen Cardiovascular status: stable and blood pressure returned to baseline Postop Assessment: no apparent nausea or vomiting Anesthetic complications: no  No notable events documented.  Last Vitals:  Vitals:   09/11/22 1200 09/11/22 1205  BP: (!) 192/104 (!) 180/94  Pulse: 85 84  Resp: 16 15  Temp:  36.6 C  SpO2: 97% 98%    Last Pain:  Vitals:   09/11/22 1145  TempSrc:   PainSc: 0-No pain                 Clester Chlebowski L Zelig Gacek

## 2022-09-14 ENCOUNTER — Ambulatory Visit (INDEPENDENT_AMBULATORY_CARE_PROVIDER_SITE_OTHER): Payer: Medicaid Other | Admitting: Student

## 2022-09-14 VITALS — BP 151/75 | HR 68 | Temp 97.7°F | Ht 70.0 in | Wt 198.3 lb

## 2022-09-14 DIAGNOSIS — R634 Abnormal weight loss: Secondary | ICD-10-CM | POA: Diagnosis not present

## 2022-09-14 DIAGNOSIS — I120 Hypertensive chronic kidney disease with stage 5 chronic kidney disease or end stage renal disease: Secondary | ICD-10-CM

## 2022-09-14 DIAGNOSIS — I5032 Chronic diastolic (congestive) heart failure: Secondary | ICD-10-CM | POA: Diagnosis not present

## 2022-09-14 DIAGNOSIS — N185 Chronic kidney disease, stage 5: Secondary | ICD-10-CM

## 2022-09-14 DIAGNOSIS — E1122 Type 2 diabetes mellitus with diabetic chronic kidney disease: Secondary | ICD-10-CM | POA: Diagnosis not present

## 2022-09-14 DIAGNOSIS — G4733 Obstructive sleep apnea (adult) (pediatric): Secondary | ICD-10-CM

## 2022-09-14 DIAGNOSIS — Z794 Long term (current) use of insulin: Secondary | ICD-10-CM | POA: Diagnosis not present

## 2022-09-14 DIAGNOSIS — I10 Essential (primary) hypertension: Secondary | ICD-10-CM

## 2022-09-14 DIAGNOSIS — E119 Type 2 diabetes mellitus without complications: Secondary | ICD-10-CM

## 2022-09-14 MED ORDER — ONDANSETRON HCL 4 MG PO TABS: 4.0000 mg | ORAL_TABLET | Freq: Every day | ORAL | 0 refills | Status: DC | PRN

## 2022-09-14 MED ORDER — CARVEDILOL 25 MG PO TABS: 25.0000 mg | ORAL_TABLET | Freq: Two times a day (BID) | ORAL | 5 refills | Status: DC

## 2022-09-14 MED ORDER — INSULIN GLARGINE 100 UNIT/ML SOLOSTAR PEN
5.0000 [IU] | PEN_INJECTOR | Freq: Every day | SUBCUTANEOUS | 1 refills | Status: DC
Start: 2022-09-14 — End: 2023-09-09

## 2022-09-14 NOTE — Assessment & Plan Note (Signed)
Patient with weight loss noted during last visit. Appears that weights were in the 260s as of 2022, then down to 210s at follow up visit in 2023. There has been a lot of fluctuation over the past year with ongoing volume overload with subsequent diuresis. He has also been eating healthier and was on trulicity therapy so unclear how much of his weight loss was truly unintentional. Nonetheless, he does have an elevated PSA of 16 and referral to urology was placed. Plan is to obtain MR prostate for further evaluation and then subsequent prostate biopsy if needed. He also has a CT chest/abd/pel ordered given 20-pack-year smoking history.   Plan: -f/u CT chest/abd/pel -f/u MR prostate

## 2022-09-14 NOTE — Assessment & Plan Note (Signed)
He is euvolemic on exam. On bumex 4mg  BID. Denies any orthopnea or PND. He is requesting a cardiology referral today which is reasonable.   -continue bumex -placed referral to cardiology

## 2022-09-14 NOTE — Assessment & Plan Note (Signed)
Recent A1c of 5.7% last month. He was previously on basaglar 15u daily and trulicity 1.5mg  weekly. His insurance stopped covering basaglar and thus this was stopped. He was also having weight loss (see "unintentional weight loss" tab) and thus trulicity was stopped as well. He has noticed that his fasting CBGs have increased to 140s-150s and CBGs later in the day have been around 180s. Given his kidney function, we are limited in options. Will start him on lantus 5u daily given well controlled A1c.   Plan: -start lantus 5u daily -repeat A1c in 2 months

## 2022-09-14 NOTE — Progress Notes (Signed)
   CC: f/u HTN, HFpEF, CKD V  HPI:  [name] a 57 y.o. male with history listed below presenting to the [facility] for f/u HTN, HFpEF. Please see individualized problem based charting for full HPI.  Past Medical History:  Diagnosis Date   Acute exacerbation of CHF (congestive heart failure) (HCC) 12/31/2021   Anxiety    Panic Atacks   Arthritis    Carpal tunnel syndrome    Chronic kidney disease    Constipation    Depression    Diabetes mellitus    DMII   Dysrhythmia    hx tachy hr   GERD (gastroesophageal reflux disease)    no problem in past few years.   Hepatitis    Hyperlipidemia    Hypertension    Neuropathy associated with endocrine disorder (HCC)    Seizures (HCC)    "stress related" no more since taking Klonopin 2 years- See Neurologist  [facility]   Shortness of breath dyspnea    with exertion   Sleep apnea    used a cpap when he was 350lb-does not need one now 260lb   Wears contact lenses     Review of Systems:  Negative aside from that listed in individualized problem based charting.  Physical Exam:  Vitals:   09/14/22 1317 09/14/22 1324 09/14/22 1347  BP: (!) 156/71 (!) 151/75 (!) 151/75  Pulse: 70 68   Temp: 97.7 F (36.5 C)    TempSrc: Oral    SpO2: 100%    Weight: 198 lb 4.8 oz (89.9 kg)    Height: 5\' 10"  (1.778 m)     Physical Exam Constitutional:      Appearance: Normal appearance. He is not ill-appearing.  HENT:     Mouth/Throat:     Mouth: Mucous membranes are moist.     Pharynx: Oropharynx is clear. No oropharyngeal exudate.  Eyes:     General: No scleral icterus.    Extraocular Movements: Extraocular movements intact.     Conjunctiva/sclera: Conjunctivae normal.  Cardiovascular:     Rate and Rhythm: Normal rate and regular rhythm.     Heart sounds: Normal heart sounds. No murmur heard.    No friction rub. No gallop.  Pulmonary:     Effort: Pulmonary effort is normal.     Breath sounds: Normal breath sounds. No wheezing,  rhonchi or rales.  Abdominal:     General: Bowel sounds are normal. There is no distension.     Palpations: Abdomen is soft.     Tenderness: There is no abdominal tenderness.  Musculoskeletal:        General: No swelling.  Skin:    General: Skin is warm and dry.     Comments: L brachiocephalic AVF in place, surgical site clean/dry/intact.  Neurological:     General: No focal deficit present.     Mental Status: He is alert and oriented to person, place, and time.  Psychiatric:        Mood and Affect: Mood normal.        Behavior: Behavior normal.      Assessment & Plan:   See Encounters Tab for problem based charting.  Patient discussed with Dr.  Sol Blazing

## 2022-09-14 NOTE — Assessment & Plan Note (Signed)
BMP last month with GFR 15, stable from prior about 7 months ago. He had a left brachiocephalic AVF placed 3 days ago (6/7) with VVS. He does note some ongoing nausea in the mornings and had good relief with zofran in the past. Last EKG in 12/2021 with stable Qtc. Will prescribe short course of zofran 4mg  daily prn and have advised him to take it daily prn.  Plan: -zofran 4mg  daily prn for 15 tablets

## 2022-09-14 NOTE — Assessment & Plan Note (Signed)
BP 156/71 with repeat 151/75. He is on coreg 25mg  BID and hydralazine 50mg  TID. Took hydralazine this morning but has not yet taken his other antihypertensives. He is also having some pain around AVF site (placed 6/7) which is likely contributing. Will avoid changing his medications at this time.  Plan: -continue coreg, hydralazine -f/u in 1 month for BP recheck

## 2022-09-14 NOTE — Patient Instructions (Addendum)
[name],  It was a pleasure seeing you in the clinic today.   I have prescribed lantus (long-acting insulin). Please try and get this from the pharmacy later today and let us know if you get any push back for it. If there is any paperwork that we need to fill out for it, we will. I have also prescribed a medicine called zofran to help with your nausea. Please only use this once a day as needed. I have placed a referral to the heart doctors. They will call you to schedule this appointment. Please come back in 1 month (or sooner) if needed.  Please call our clinic at [phone] if you have any questions or concerns. The best time to call is Monday-Friday from 9am-4pm, but there is someone available 24/7 at the same number. If you need medication refills, please notify your pharmacy one week in advance and they will send [place] a request.   Thank you for letting us take part in your care. We look forward to seeing you next time!

## 2022-09-15 ENCOUNTER — Other Ambulatory Visit (HOSPITAL_COMMUNITY): Payer: Self-pay

## 2022-09-16 ENCOUNTER — Other Ambulatory Visit (HOSPITAL_COMMUNITY): Payer: Self-pay

## 2022-09-16 NOTE — Progress Notes (Signed)
Internal Medicine Clinic Attending  Case discussed with Dr. Jinwala  At the time of the visit.  We reviewed the resident's history and exam and pertinent patient test results.  I agree with the assessment, diagnosis, and plan of care documented in the resident's note.  

## 2022-09-21 ENCOUNTER — Telehealth: Payer: Self-pay

## 2022-09-21 NOTE — Telephone Encounter (Signed)
Pt called with c/o L shoulder pain that goes down towards his elbow. He states his arm is still "a little swollen" and he has been keeping it at his side in a sling. I have advised him to take XS Tylenol if he is able to take that and to elevate his arm above heart level. He will call us back if this does not improve or worsens.

## 2022-09-24 ENCOUNTER — Ambulatory Visit (INDEPENDENT_AMBULATORY_CARE_PROVIDER_SITE_OTHER): Payer: Medicaid Other | Admitting: Podiatry

## 2022-09-24 DIAGNOSIS — B351 Tinea unguium: Secondary | ICD-10-CM | POA: Diagnosis not present

## 2022-09-24 DIAGNOSIS — M79675 Pain in left toe(s): Secondary | ICD-10-CM | POA: Diagnosis not present

## 2022-09-24 DIAGNOSIS — E1149 Type 2 diabetes mellitus with other diabetic neurological complication: Secondary | ICD-10-CM | POA: Diagnosis not present

## 2022-09-24 DIAGNOSIS — L84 Corns and callosities: Secondary | ICD-10-CM

## 2022-09-24 DIAGNOSIS — M79674 Pain in right toe(s): Secondary | ICD-10-CM | POA: Diagnosis not present

## 2022-09-26 NOTE — Progress Notes (Signed)
Subjective: Chief Complaint  Patient presents with   Callouses    57 year old male presents for above concerns.  Nails are thick and elongated causing discomfort.  He has a thick callus inside of his left big toe which has been growing over time causing discomfort again.  Denies any drainage or pus or any swelling or redness.  No fever or chills.   Last A1c was 5.7 on Aug 12, 2022 Lyndle Herrlich, MD-last seen May 12, 2022   Objective: AAO x3, NAD DP/PT pulses palpable bilaterally, CRT less than 3 seconds Sensation decreased with Semmes Weinstein monofilament Nails are hypertrophic, dystrophic, brittle, discolored, elongated 10. No surrounding redness or drainage. Tenderness nails 1-5 bilaterally.  Along the medial aspect of the left hallux there is a large callus noted on the medial aspect the left hallux IPJ.  Upon debridement there was some macerated tissue with superficial skin breakdown. No pain with calf compression, swelling, warmth, erythema    Assessment: Preulcerative large callus left hallux; symptomatic onychomycosis  Plan: -All treatment options discussed with the patient including all alternatives, risks, complications.  -Nails debrided x 10 without any complications or bleeding -I sharply debrided the lesion to the any complications further bleeding.  Continue offloading, moisturizer. -Daily foot inspection. -Patient encouraged to call the office with any questions, concerns, change in symptoms.   Vivi Barrack DPM

## 2022-09-28 ENCOUNTER — Other Ambulatory Visit: Payer: Self-pay | Admitting: *Deleted

## 2022-09-28 DIAGNOSIS — N186 End stage renal disease: Secondary | ICD-10-CM

## 2022-09-29 ENCOUNTER — Other Ambulatory Visit (HOSPITAL_COMMUNITY): Payer: Self-pay

## 2022-09-29 ENCOUNTER — Other Ambulatory Visit: Payer: Self-pay

## 2022-10-01 ENCOUNTER — Ambulatory Visit
Admission: EM | Admit: 2022-10-01 | Discharge: 2022-10-01 | Disposition: A | Discharge status: Home / Self Care | Payer: Medicaid Other | Attending: Family Medicine | Admitting: Family Medicine

## 2022-10-01 DIAGNOSIS — Z4802 Encounter for removal of sutures: Secondary | ICD-10-CM | POA: Diagnosis not present

## 2022-10-01 NOTE — ED Triage Notes (Signed)
Patient here today to have sutures removed from upper lip. They were placed on 09/19/2022 at an UC in [place].

## 2022-10-01 NOTE — Discharge Instructions (Signed)
We have taken out your sutures

## 2022-10-01 NOTE — ED Provider Notes (Signed)
[facility]    CSN: [id number] Arrival date & time: 10/01/22  1254      History   Chief Complaint Chief Complaint  Patient presents with   Suture / Staple Removal    HPI [name] is a 57 y.o. male.    Suture / Staple Removal   Here for suture removal.  On 6/15 he got lightheaded at a cookout and passed out, falling forward. He sustained a laceration to his upper lip, and got sutured in an [facility] in [place] where he was. States 7 sutures placed. Was supposed to have had them out 6/22, but he did not fell well for unrelated reasons, and so is just now coming in for removal. No pain there now, and no dc.  He is on dialysis.  Past Medical History:  Diagnosis Date   Acute exacerbation of CHF (congestive heart failure) (HCC) 12/31/2021   Anxiety    Panic Atacks   Arthritis    Carpal tunnel syndrome    Chronic kidney disease    Constipation    Depression    Diabetes mellitus    DMII   Dysrhythmia    hx tachy hr   GERD (gastroesophageal reflux disease)    no problem in past few years.   Hepatitis    Hyperlipidemia    Hypertension    Neuropathy associated with endocrine disorder (HCC)    Seizures (HCC)    "stress related" no more since taking Klonopin 2 years- See Neurologist  [facility]   Shortness of breath dyspnea    with exertion   Sleep apnea    used a cpap when he was 350lb-does not need one now 260lb   Wears contact lenses     Patient Active Problem List   Diagnosis Date Noted   Liver dysfunction 02/03/2022   Healthcare maintenance 01/09/2022   OSA (obstructive sleep apnea) 01/01/2022   CKD (chronic kidney disease) stage 5, GFR less than 15 ml/min (HCC) 12/31/2021   (HFpEF) heart failure with preserved ejection fraction (HCC) 12/23/2021   Constipation 10/10/2021   Tachycardia 10/02/2021   Unintentional weight loss 09/28/2021   Charcot foot due to diabetes mellitus (HCC) 09/05/2020   Abdominal pain 12/28/2019   Depression    Diabetic  neuropathy (HCC) 09/19/2019   Hyperlipidemia 05/10/2019   Chronic hepatitis C (HCC) 05/10/2019   Abnormal CT scan of lung 05/10/2019   Type 2 diabetes mellitus (HCC) 01/29/2016   Essential hypertension 01/29/2016   Lumbar facet arthropathy 08/29/2014    Past Surgical History:  Procedure Laterality Date   AV FISTULA PLACEMENT Left 09/11/2022   Procedure: LEFT ARM BRACHIOCEPHALIC ARTERIOVENOUS (AV) FISTULA CREATION;  Surgeon: Maeola Harman, MD;  Location: [facility] OR;  Service: Vascular;  Laterality: Left;   CATARACT EXTRACTION Bilateral    COLONOSCOPY     KNEE ARTHROSCOPY W/ MENISCAL REPAIR  04/06/1998   left   LUMBAR LAMINECTOMY  04/06/2000       Home Medications    Prior to Admission medications   Medication Sig Start Date End Date Taking? Authorizing Provider  atorvastatin (LIPITOR) 40 MG tablet TAKE 1 TABLET(40 MG) BY MOUTH DAILY 04/28/22  Yes Lyndle Herrlich, MD  bumetanide (BUMEX) 2 MG tablet Take 2 tablets (4 mg total) by mouth 2 (two) times daily. 09/03/22  Yes Lyndle Herrlich, MD  carvedilol (COREG) 25 MG tablet Take 1 tablet (25 mg total) by mouth 2 (two) times daily with a meal. 09/14/22 03/13/23 Yes Merrilyn Puma, MD  gabapentin (NEURONTIN)  100 MG capsule Take 100 mg by mouth at bedtime. 06/08/22  Yes [provider]  hydrALAZINE (APRESOLINE) 50 MG tablet Take 1 tablet (50 mg total) by mouth 3 (three) times daily. 05/12/22  Yes Mapp, Tavien, MD  insulin glargine (LANTUS) 100 UNIT/ML Solostar Pen Inject 5 Units into the skin daily. 09/14/22  Yes Merrilyn Puma, MD  ondansetron (ZOFRAN) 4 MG tablet Take 1 tablet (4 mg total) by mouth daily as needed for nausea or vomiting. 09/14/22 09/14/23 Yes Merrilyn Puma, MD  oxyCODONE (ROXICODONE) 5 MG immediate release tablet Take 1 tablet (5 mg total) by mouth every 4 (four) hours as needed for severe pain. 09/11/22  Yes Schuh, McKenzi P, PA-C  sertraline (ZOLOFT) 50 MG tablet Take 1 tablet (50 mg total) by mouth  daily. 05/12/22  Yes Mapp, Tavien, MD  feeding supplement (BOOST HIGH PROTEIN) LIQD Take 1 Container by mouth daily as needed (Supplement).    [provider]  Insulin Pen Needle (PEN NEEDLES) 32G X 4 MM MISC 1 Needle by Does not apply route daily at 6 (six) AM. 02/17/22   Merrilyn Puma, MD    Family History Family History  Problem Relation Age of Onset   Cancer Mother        pancres   Diabetes Mother    Hypertension Brother    Renal cancer Brother    Cancer Maternal Uncle    Colon cancer Maternal Uncle    Colon cancer Maternal Uncle    Stomach cancer Maternal Uncle    Esophageal cancer Neg Hx    Rectal cancer Neg Hx     Social History Social History   Tobacco Use   Smoking status: Former    Types: Cigarettes    Quit date: 10/27/2008    Years since quitting: 13.9   Smokeless tobacco: Never   Tobacco comments:    Quit x 10 yrs.  Vaping Use   Vaping Use: Never used  Substance Use Topics   Alcohol use: Not Currently    Comment: Beer sometimes.   Drug use: No    Types: Marijuana     Allergies   Norvasc [amlodipine] and Zestril [lisinopril]   Review of Systems Review of Systems   Physical Exam Triage Vital Signs ED Triage Vitals [10/01/22 1302]  Enc Vitals Group     BP (!) 197/92     Pulse Rate 87     Resp 18     Temp 98.4 F (36.9 C)     Temp Source Oral     SpO2 98 %     Weight 200 lb (90.7 kg)     Height 5\' 11"  (1.803 m)     Head Circumference      Peak Flow      Pain Score 0     Pain Loc      Pain Edu?      Excl. in GC?    No data found.  Updated Vital Signs BP (!) 197/92 (BP Location: Right Arm)   Pulse 87   Temp 98.4 F (36.9 C) (Oral)   Resp 18   Ht 5\' 11"  (1.803 m)   Wt 90.7 kg   SpO2 98%   BMI 27.89 kg/m   Visual Acuity Right Eye Distance:   Left Eye Distance:   Bilateral Distance:    Right Eye Near:   Left Eye Near:    Bilateral Near:     Physical Exam Vitals reviewed.  Constitutional:  General: He is not  in acute distress.    Appearance: He is not ill-appearing, toxic-appearing or diaphoretic.  HENT:     Mouth/Throat:     Comments: The laceration is well healed, with sutures visible on the vermilion part of the lip and on upper lip (pt has a beard/mustache) Skin:    Coloration: Skin is not pale.  Neurological:     Mental Status: He is alert and oriented to person, place, and time.  Psychiatric:        Behavior: Behavior normal.      UC Treatments / Results  Labs (all labs ordered are listed, but only abnormal results are displayed) Labs Reviewed - No data to display  EKG   Radiology No results found.  Procedures Procedures (including critical care time)  Medications Ordered in UC Medications - No data to display  Initial Impression / Assessment and Plan / UC Course  I have reviewed the triage vital signs and the nursing notes.  Pertinent labs & imaging results that were available during my care of the patient were reviewed by me and considered in my medical decision making (see chart for details).        7 sutures removed by staff Final Clinical Impressions(s) / UC Diagnoses   Final diagnoses:  None   Discharge Instructions   None    ED Prescriptions   None    PDMP not reviewed this encounter.   Zenia Resides, MD 10/01/22 [phone]

## 2022-10-12 ENCOUNTER — Ambulatory Visit (HOSPITAL_COMMUNITY)
Admission: RE | Admit: 2022-10-12 | Discharge: 2022-10-12 | Disposition: A | Discharge status: Home / Self Care | Payer: Medicaid Other | Source: Ambulatory Visit | Attending: Surgery | Admitting: Surgery

## 2022-10-12 DIAGNOSIS — N186 End stage renal disease: Secondary | ICD-10-CM | POA: Insufficient documentation

## 2022-10-14 ENCOUNTER — Ambulatory Visit (INDEPENDENT_AMBULATORY_CARE_PROVIDER_SITE_OTHER): Payer: Medicaid Other | Admitting: Physician Assistant

## 2022-10-14 VITALS — BP 186/89 | HR 67 | Temp 97.2°F | Resp 16 | Ht 71.0 in | Wt 204.0 lb

## 2022-10-14 DIAGNOSIS — N185 Chronic kidney disease, stage 5: Secondary | ICD-10-CM

## 2022-10-14 NOTE — Progress Notes (Signed)
Postoperative Access Visit   History of Present Illness   [name] is a 57 y.o. year old male who presents for postoperative follow-up for: Left brachiocephalic AV fistula on 09/11/22 by Dr. Randie Heinz.  The patient's wounds are well healed.  The patient notes no true steal symptoms. He explains that since surgery he has had aching in his left shoulder region. This is mostly constant but kinda of changes in intensity. He says its " like a tooth ache". It has improved some since surgery. He also had swelling that resolved. He has numbness and tingling in his hands but this is bilaterally and was present prior to surgery. He otherwise denies any pain in his left hand, no weakness or numbness.   The patient is able to complete their activities of daily living.  He is not currently on Dialysis. He saw his Nephrologist yesterday and will follow up again in 6 weeks.   Physical Examination   Vitals:   10/14/22 0949  BP: (!) 186/89  Pulse: 67  Resp: 16  Temp: (!) 97.2 F (36.2 C)  TempSrc: Temporal  SpO2: 99%  Weight: 204 lb (92.5 kg)  Height: 5\' 11"  (1.803 m)   Body mass index is 28.45 kg/m.  left arm Incision is well healed, 2+ radial pulse, hand grip is 5/5, sensation in digits is intact, palpable thrill, bruit can  be auscultated    Non invasive vascular lab: Findings:  +--------------------+----------+-----------------+--------+  AVF                PSV (cm/s)Flow Vol (mL/min)Comments  +--------------------+----------+-----------------+--------+  Native artery inflow   211           953                 +--------------------+----------+-----------------+--------+  AVF Anastomosis        431                               +--------------------+----------+-----------------+--------+     +------------+----------+-------------+----------+--------------+  OUTFLOW VEINPSV (cm/s)Diameter (cm)Depth (cm)   Describe      +------------+----------+-------------+----------+--------------+  Prox UA        376        0.51        0.63   branch 0.25 cm  +------------+----------+-------------+----------+--------------+  Mid UA         134        0.68        0.40   branch 0.26 cm  +------------+----------+-------------+----------+--------------+  Dist UA        134        0.63        0.31                   +------------+----------+-------------+----------+--------------+  AC Fossa       224        0.87        0.29                   +------------+----------+-------------+----------+--------------+    Summary:  Patent left BC AVF. No evidence of stenosis or thrombus. Branches as above.   Medical Decision Making   [name] is a 57 y.o. year old male who presents s/p  Left brachiocephalic AV fistula on 09/11/22 by Dr. Randie Heinz.  The patient's wounds are well healed. He does not have typical steal symptoms but is having aching in his left  shoulder since surgery. This has improved a little. Hopefully will continue to improve with time. Recommend elevation, Tylenol as needed for pain and also can try warm or cold compresses.  Patent is without signs or symptoms of steal syndrome The patient's access will be ready for use after 12/12/22 He knows to call for follow up evaluation if his left upper arm/ shoulder pain worsens or he has other concerns The patient may follow up on a prn basis   Graceann Congress, PA-C [facility] Office: [phone]  Clinic MD: Randie Heinz

## 2022-10-21 ENCOUNTER — Ambulatory Visit
Admission: RE | Admit: 2022-10-21 | Discharge: 2022-10-21 | Disposition: A | Discharge status: Home / Self Care | Payer: Medicaid Other | Source: Ambulatory Visit | Attending: Urology | Admitting: Urology

## 2022-10-21 DIAGNOSIS — R972 Elevated prostate specific antigen [PSA]: Secondary | ICD-10-CM

## 2022-10-22 ENCOUNTER — Other Ambulatory Visit: Payer: Self-pay | Admitting: Student

## 2022-10-22 DIAGNOSIS — I10 Essential (primary) hypertension: Secondary | ICD-10-CM

## 2022-10-22 MED ORDER — HYDRALAZINE HCL 50 MG PO TABS
25.0000 mg | ORAL_TABLET | Freq: Three times a day (TID) | ORAL | 5 refills | Status: DC
Start: 2022-10-22 — End: 2023-02-02

## 2022-10-26 ENCOUNTER — Other Ambulatory Visit: Payer: Self-pay | Admitting: Internal Medicine

## 2022-10-26 ENCOUNTER — Ambulatory Visit (HOSPITAL_COMMUNITY)
Admission: RE | Admit: 2022-10-26 | Discharge: 2022-10-26 | Disposition: A | Discharge status: Home / Self Care | Payer: Medicaid Other | Source: Ambulatory Visit | Attending: Internal Medicine | Admitting: Internal Medicine

## 2022-10-26 DIAGNOSIS — R972 Elevated prostate specific antigen [PSA]: Secondary | ICD-10-CM

## 2022-10-26 DIAGNOSIS — E119 Type 2 diabetes mellitus without complications: Secondary | ICD-10-CM

## 2022-10-26 DIAGNOSIS — R634 Abnormal weight loss: Secondary | ICD-10-CM | POA: Diagnosis not present

## 2022-10-26 DIAGNOSIS — N185 Chronic kidney disease, stage 5: Secondary | ICD-10-CM

## 2022-10-26 DIAGNOSIS — I5032 Chronic diastolic (congestive) heart failure: Secondary | ICD-10-CM

## 2022-10-26 DIAGNOSIS — I10 Essential (primary) hypertension: Secondary | ICD-10-CM

## 2022-10-26 MED ORDER — SODIUM CHLORIDE (PF) 0.9 % IJ SOLN
INTRAMUSCULAR | Status: AC
  Filled 2022-10-26: qty 50

## 2022-10-26 NOTE — Progress Notes (Signed)
Received call from Lanny Hurst at [facility] about orders for CT chest/abdomen/pelvis with contrast, patient has arrived to complete imaging today. Patient with hx of CKD5 and recently had AV fistula placed by [facility] in June. Not on dialysis at this time. Stated patient is unable to receive IV contrast. Verbal order placed to complete CT chest/abdomen/pelvis without contrast.

## 2022-10-27 ENCOUNTER — Encounter: Payer: Medicaid Other | Admitting: Student

## 2022-11-02 ENCOUNTER — Other Ambulatory Visit (HOSPITAL_COMMUNITY): Payer: Self-pay | Admitting: *Deleted

## 2022-11-05 ENCOUNTER — Other Ambulatory Visit (HOSPITAL_COMMUNITY): Payer: Self-pay

## 2022-11-05 ENCOUNTER — Encounter (HOSPITAL_COMMUNITY)
Admission: RE | Admit: 2022-11-05 | Discharge: 2022-11-05 | Disposition: A | Discharge status: Home / Self Care | Payer: Medicaid Other | Source: Ambulatory Visit | Attending: Internal Medicine | Admitting: Internal Medicine

## 2022-11-05 DIAGNOSIS — D631 Anemia in chronic kidney disease: Secondary | ICD-10-CM | POA: Diagnosis not present

## 2022-11-05 DIAGNOSIS — N189 Chronic kidney disease, unspecified: Secondary | ICD-10-CM | POA: Diagnosis present

## 2022-11-05 MED ORDER — SODIUM CHLORIDE 0.9 % IV SOLN
510.0000 mg | INTRAVENOUS | Status: DC
  Administered 2022-11-05: 510 mg via INTRAVENOUS
  Filled 2022-11-05: qty 510

## 2022-11-11 ENCOUNTER — Ambulatory Visit (HOSPITAL_COMMUNITY)
Admission: RE | Admit: 2022-11-11 | Discharge: 2022-11-11 | Disposition: A | Discharge status: Home / Self Care | Payer: Medicaid Other | Source: Ambulatory Visit | Attending: Internal Medicine | Admitting: Internal Medicine

## 2022-11-11 ENCOUNTER — Ambulatory Visit (INDEPENDENT_AMBULATORY_CARE_PROVIDER_SITE_OTHER): Payer: Medicaid Other | Admitting: Internal Medicine

## 2022-11-11 ENCOUNTER — Encounter: Payer: Self-pay | Admitting: Internal Medicine

## 2022-11-11 VITALS — BP 180/76 | HR 66 | Temp 98.2°F | Ht 71.0 in | Wt 208.2 lb

## 2022-11-11 DIAGNOSIS — F32A Depression, unspecified: Secondary | ICD-10-CM

## 2022-11-11 DIAGNOSIS — Z794 Long term (current) use of insulin: Secondary | ICD-10-CM

## 2022-11-11 DIAGNOSIS — R9431 Abnormal electrocardiogram [ECG] [EKG]: Secondary | ICD-10-CM | POA: Insufficient documentation

## 2022-11-11 DIAGNOSIS — R634 Abnormal weight loss: Secondary | ICD-10-CM | POA: Diagnosis present

## 2022-11-11 DIAGNOSIS — E119 Type 2 diabetes mellitus without complications: Secondary | ICD-10-CM | POA: Diagnosis not present

## 2022-11-11 DIAGNOSIS — Z87891 Personal history of nicotine dependence: Secondary | ICD-10-CM | POA: Diagnosis not present

## 2022-11-11 DIAGNOSIS — I110 Hypertensive heart disease with heart failure: Secondary | ICD-10-CM

## 2022-11-11 DIAGNOSIS — I10 Essential (primary) hypertension: Secondary | ICD-10-CM

## 2022-11-11 DIAGNOSIS — I5032 Chronic diastolic (congestive) heart failure: Secondary | ICD-10-CM

## 2022-11-11 DIAGNOSIS — R112 Nausea with vomiting, unspecified: Secondary | ICD-10-CM

## 2022-11-11 LAB — GLUCOSE, CAPILLARY: Glucose-Capillary: 137 mg/dL — ABNORMAL HIGH (ref 70–99)

## 2022-11-11 LAB — POCT GLYCOSYLATED HEMOGLOBIN (HGB A1C): Hemoglobin A1C: 5.8 % — AB (ref 4.0–5.6)

## 2022-11-11 MED ORDER — SERTRALINE HCL 100 MG PO TABS
100.0000 mg | ORAL_TABLET | Freq: Every day | ORAL | 2 refills | Status: DC
Start: 2022-11-11 — End: 2023-05-27

## 2022-11-11 MED ORDER — PROCHLORPERAZINE MALEATE 5 MG PO TABS
5.0000 mg | ORAL_TABLET | Freq: Four times a day (QID) | ORAL | 0 refills | Status: DC | PRN
Start: 2022-11-11 — End: 2022-11-16

## 2022-11-11 NOTE — Progress Notes (Unsigned)
CC: routine f/u  HPI:  [name] a 57 y.o. male with past medical history as detailed below who presents for routine check up. Please see problem based charting for detailed assessment and plan.  Past Medical History:  Diagnosis Date   Acute exacerbation of CHF (congestive heart failure) (HCC) 12/31/2021   Anxiety    Panic Atacks   Arthritis    Carpal tunnel syndrome    Chronic kidney disease    Constipation    Depression    Diabetes mellitus    DMII   Dysrhythmia    hx tachy hr   GERD (gastroesophageal reflux disease)    no problem in past few years.   Hepatitis    Hyperlipidemia    Hypertension    Nausea and vomiting 11/12/2022   Neuropathy associated with endocrine disorder (HCC)    Seizures (HCC)    "stress related" no more since taking Klonopin 2 years- See Neurologist  [facility]   Shortness of breath dyspnea    with exertion   Sleep apnea    used a cpap when he was 350lb-does not need one now 260lb   Wears contact lenses    Review of Systems:  Negative unless otherwise stated.  Physical Exam:  Vitals:   11/11/22 1359 11/11/22 1423  BP: (!) 167/77 (!) 180/76  Pulse: 76 66  Temp: 98.2 F (36.8 C)   TempSrc: Oral   SpO2: 100%   Weight: 208 lb 3.2 oz (94.4 kg)   Height: 5\' 11"  (1.803 m)    Constitutional:Pleasant gentleman, seated comfortably in no acute distress. Cardio:Regular rate and rhythm. No murmurs, rubs, or gallops. L AVF with bruit. Pulm:Clear to auscultation bilaterally. Normal work of breathing on room air. Abdomen:Soft, nontender, nondistended. YNW:GNFAOZHYQ 2+ pitting edema of LE to knees. Skin:Warm and dry. Neuro:Alert and oriented x3. No focal deficit noted. Psych:Pleasant mood and affect.  Assessment & Plan:   See Encounters Tab for problem based charting.  Unintentional weight loss Weight today 208 lbs, increased from 201 pounds on 08/01, though I suspect this is due to fluid retention rather than normal weight gain.  PSA noted to be elevated at last OV at 16 and a referral to urology was placed; he has not been seen by urology yet because he needs to have a MR prostate completed first which has been rescheduled for September as he did not correctly prep for this the first time. CT chest/abdomen/pelvis ordered at last OV showed severe DDD and irregularity at L3-4 and L4-5 with inability to exclude discitis. He is not complaining of pain in the area or signs of systemic illness. Plan:MRI follow up for further assessment of CT findings if patient develops back pain or signs of systemic illness. Await referral visit with urology.  Depression PHQ-9 17, unchanged from prior. On sertraline 50 mg daily. Plan:Increase sertraline to 100 mg daily.  Type 2 diabetes mellitus (HCC) HbA1c 08/2022 5.7%, today 5.8%. OP regimen is lantus 5 units daily. He does not check his CBG at home.  Plan:Continue lantus 5 units daily.  (HFpEF) heart failure with preserved ejection fraction (HCC) Clinically hypervolemic on exam today as evidenced by bilateral 2+ LE edema to the knees. OP regimen is bumex 4 mg BID however due to nausea and dry heaving he has not taken more than 2 doses over the last 1-2 weeks. He does express concern that he may be approaching the need to start hemodialysis because of his fluid status. He has noticed  weight gain over the last several days though again he has not been taking his bumex as prescribed. No respiratory symptoms concerning for acute need for IV diuresis. Cardiology referral was placed at last OV and he has an appointmeny Monday 11/16/2022. He is following with his nephrologist every few weeks and is set to see them again soon. Plan:Will treat nausea and encourage patient to take bumex as prescribed.  Essential hypertension BP today 167/77, on recheck 180/76. OP regimen is carvedilol 25 mg BID and hydralazine 50 mg TID. He has taken his medications today but this is the first time in a few days that  they were taken. He has only taken one dose of the hydralazine today of the two he normally takes by this time of day. He checks BP at home and it normally runs SBP 140s-150s.He is also not taking bumex as prescribed due to nausea. Plan:No change in regimen today as he has not been optimally taking his current regimen.   Nausea and vomiting Explains that when ozempic first came out he used the medication and at that time he thinks his issues with nausea and vomiting began. He has dry heaving, sweating, and spitting with the episodes. Zofran previously helped but does not at this time. He has not tried other anti-nausea medications. I do not suspect that he is acutely uremic. EKG checked to assess Qtc which showed Qtc 465 ms, NSR with rate 68, possible L atrial enlargement, overall unchanged from prior. Plan:Trial prochlorperazine 5 mg q6h PRN nausea.  Patient discussed with Dr. Criselda Peaches

## 2022-11-11 NOTE — Patient Instructions (Addendum)
Mr. Feister,  It was nice seeing you today! Thank you for choosing [facility] for your Primary Care.    I would like you to increase your dose of sertraline to 100 mg daily.  I am also sending in a medicine for nausea called compazine. You can take 5 mg every 6 hours as needed for nausea. Make sure to take this before you take your medicine!  I am not going to change your other medications.  Please follow up in about 6 weeks so we can assess how your depression symptoms are responding to the new dose sertraline.  My best, Dr. August Saucer

## 2022-11-12 ENCOUNTER — Ambulatory Visit (HOSPITAL_COMMUNITY)
Admission: RE | Admit: 2022-11-12 | Discharge: 2022-11-12 | Disposition: A | Discharge status: Home / Self Care | Payer: Medicaid Other | Source: Ambulatory Visit | Attending: Internal Medicine | Admitting: Internal Medicine

## 2022-11-12 ENCOUNTER — Encounter: Payer: Self-pay | Admitting: Internal Medicine

## 2022-11-12 DIAGNOSIS — D631 Anemia in chronic kidney disease: Secondary | ICD-10-CM | POA: Insufficient documentation

## 2022-11-12 DIAGNOSIS — N189 Chronic kidney disease, unspecified: Secondary | ICD-10-CM | POA: Insufficient documentation

## 2022-11-12 DIAGNOSIS — R112 Nausea with vomiting, unspecified: Secondary | ICD-10-CM | POA: Insufficient documentation

## 2022-11-12 HISTORY — DX: Nausea with vomiting, unspecified: R11.2

## 2022-11-12 MED ORDER — SODIUM CHLORIDE 0.9 % IV SOLN
510.0000 mg | INTRAVENOUS | Status: DC
  Administered 2022-11-12: 510 mg via INTRAVENOUS
  Filled 2022-11-12: qty 510

## 2022-11-12 NOTE — Assessment & Plan Note (Signed)
PHQ-9 17, unchanged from prior. On sertraline 50 mg daily. Plan:Increase sertraline to 100 mg daily.

## 2022-11-12 NOTE — Assessment & Plan Note (Signed)
Weight today 208 lbs, increased from 201 pounds on 08/01, though I suspect this is due to fluid retention rather than normal weight gain. PSA noted to be elevated at last OV at 16 and a referral to urology was placed; he has not been seen by urology yet because he needs to have a MR prostate completed first which has been rescheduled for September as he did not correctly prep for this the first time. CT chest/abdomen/pelvis ordered at last OV showed severe DDD and irregularity at L3-4 and L4-5 with inability to exclude discitis. He is not complaining of pain in the area or signs of systemic illness. Plan:MRI follow up for further assessment of CT findings if patient develops back pain or signs of systemic illness. Await referral visit with urology.

## 2022-11-12 NOTE — Assessment & Plan Note (Signed)
BP today 167/77, on recheck 180/76. OP regimen is carvedilol 25 mg BID and hydralazine 50 mg TID. He has taken his medications today but this is the first time in a few days that they were taken. He has only taken one dose of the hydralazine today of the two he normally takes by this time of day. He checks BP at home and it normally runs SBP 140s-150s.He is also not taking bumex as prescribed due to nausea. Plan:No change in regimen today as he has not been optimally taking his current regimen.

## 2022-11-12 NOTE — Assessment & Plan Note (Signed)
Clinically hypervolemic on exam today as evidenced by bilateral 2+ LE edema to the knees. OP regimen is bumex 4 mg BID however due to nausea and dry heaving he has not taken more than 2 doses over the last 1-2 weeks. He does express concern that he may be approaching the need to start hemodialysis because of his fluid status. He has noticed weight gain over the last several days though again he has not been taking his bumex as prescribed. No respiratory symptoms concerning for acute need for IV diuresis. Cardiology referral was placed at last OV and he has an appointmeny Monday 11/16/2022. He is following with his nephrologist every few weeks and is set to see them again soon. Plan:Will treat nausea and encourage patient to take bumex as prescribed.

## 2022-11-12 NOTE — Assessment & Plan Note (Signed)
HbA1c 08/2022 5.7%, today 5.8%. OP regimen is lantus 5 units daily. He does not check his CBG at home.  Plan:Continue lantus 5 units daily.

## 2022-11-12 NOTE — Assessment & Plan Note (Signed)
Explains that when ozempic first came out he used the medication and at that time he thinks his issues with nausea and vomiting began. He has dry heaving, sweating, and spitting with the episodes. Zofran previously helped but does not at this time. He has not tried other anti-nausea medications. I do not suspect that he is acutely uremic. EKG checked to assess Qtc which showed Qtc 465 ms, NSR with rate 68, possible L atrial enlargement, overall unchanged from prior. Plan:Trial prochlorperazine 5 mg q6h PRN nausea.

## 2022-11-15 NOTE — Progress Notes (Signed)
 Internal Medicine Clinic Attending  Case discussed with the resident at the time of the visit.  We reviewed the resident's history and exam and pertinent patient test results.  I agree with the assessment, diagnosis, and plan of care documented in the resident's note.  Debe Coder, MD

## 2022-11-16 ENCOUNTER — Encounter: Payer: Self-pay | Admitting: Cardiovascular Disease

## 2022-11-16 ENCOUNTER — Ambulatory Visit: Payer: Medicaid Other | Admitting: Cardiovascular Disease

## 2022-11-16 VITALS — BP 136/78 | HR 73 | Ht 71.0 in | Wt 215.0 lb

## 2022-11-16 DIAGNOSIS — I10 Essential (primary) hypertension: Secondary | ICD-10-CM | POA: Diagnosis not present

## 2022-11-16 DIAGNOSIS — R0989 Other specified symptoms and signs involving the circulatory and respiratory systems: Secondary | ICD-10-CM | POA: Diagnosis not present

## 2022-11-16 DIAGNOSIS — I5032 Chronic diastolic (congestive) heart failure: Secondary | ICD-10-CM | POA: Diagnosis not present

## 2022-11-16 NOTE — Progress Notes (Signed)
  Cardiology Office Note:  .   Date:  11/16/2022  ID:  [name], DOB [birth year], MRN [id number] PCP: Joseph James, DO  [facility] Providers Cardiologist:    Click to update primary MD,subspecialty MD or APP then REFRESH:1}   History of Present Illness: .   [name] is a 57 y.o. 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  He has grade 1 diastolic dysfunction. Trivial tricuspid regurgitation.   Creatinine is 4.7   He watches his fluid good intake   Former smoker, Stopped in 2010       ROS:   Studies Reviewed: .         Risk Assessment/Calculations:             Physical Exam:   VS:  BP 136/78   Pulse 73   Ht 5\' 11"  (1.803 m)   Wt 215 lb (97.5 kg)   SpO2 98%   BMI 29.99 kg/m    Wt Readings from Last 3 Encounters:  11/16/22 215 lb (97.5 kg)  11/12/22 208 lb 3.2 oz (94.4 kg)  11/11/22 208 lb 3.2 oz (94.4 kg)    GEN: Well nourished, well developed in no acute distress NECK: No JVD; bilateral carotid bruits, left greater than right CARDIAC: Regular rate S1-S2.  Soft systolic murmur. RESPIRATORY:  Clear to auscultation without rales, wheezing or rhonchi  ABDOMEN: Soft, non-tender, non-distended EXTREMITIES:  2-3 + tense edema   ASSESSMENT AND PLAN: .     Chronic diastolic CHF: [name] has normal left ventricular systolic function.  He has grade 1 diastolic dysfunction.  He is significantly volume overloaded today but I suspect most of this is due to his chronic kidney disease.  I will defer diuretic therapy and hypertensive therapy to his nephrologist since he has chronic kidney disease.  I suspect his volume status will greatly improve once he  starts dialysis.  He appears to be at least 20 pounds volume overloaded and I suspect that he will need to go on dialysis soon.  He is on high dose of diuretics but is having more more difficulty achieving adequate diuresis.   2.  CKD; creatinine is 4. 7.   Further evaluation per his nephrologist    3. DM- A1c is 5.8   4.  Bilateral carotid bruits: He has bilateral carotid bruits, left greater than right.  Will check carotid duplex scan.  He is already established with Dr. Randie [name] at [facility].  Will get the duplex scan at [facility].        Dispo: 1 year   Signed, Joseph Miss, MD

## 2022-11-16 NOTE — Patient Instructions (Signed)
Medication Instructions:  Your physician recommends that you continue on your current medications as directed. Please refer to the Current Medication list given to you today.  *If you need a refill on your cardiac medications before your next appointment, please call your pharmacy*  Lab Work: NONE If you have labs (blood work) drawn today and your tests are completely normal, you will receive your results only by: MyChart Message (if you have MyChart) OR A paper copy in the mail If you have any lab test that is abnormal or we need to change your treatment, we will call you to review the results.  Testing/Procedures: Carotid Ultrasound Your physician has requested that you have a carotid duplex. This test is an ultrasound of the carotid arteries in your neck. It looks at blood flow through these arteries that supply the brain with blood. Allow one hour for this exam. There are no restrictions or special instructions.  Follow-Up: At [facility], you and your health needs are our priority.  As part of our continuing mission to provide you with exceptional heart care, we have created designated Provider Care Teams.  These Care Teams include your primary Cardiologist (physician) and Advanced Practice Providers (APPs -  Physician Assistants and Nurse Practitioners) who all work together to provide you with the care you need, when you need it.  Your next appointment:   1 year(s)  Provider:   Kristeen Miss, MD

## 2022-11-24 LAB — LAB REPORT - SCANNED
EGFR: 16
HM Hepatitis Screen: POSITIVE

## 2022-12-01 ENCOUNTER — Ambulatory Visit (HOSPITAL_COMMUNITY): Payer: Medicaid Other

## 2022-12-02 ENCOUNTER — Ambulatory Visit (HOSPITAL_COMMUNITY)
Admission: RE | Admit: 2022-12-02 | Discharge: 2022-12-02 | Disposition: A | Discharge status: Home / Self Care | Payer: Medicaid Other | Source: Ambulatory Visit | Attending: Cardiology | Admitting: Cardiology

## 2022-12-02 DIAGNOSIS — R0989 Other specified symptoms and signs involving the circulatory and respiratory systems: Secondary | ICD-10-CM | POA: Diagnosis not present

## 2022-12-14 ENCOUNTER — Ambulatory Visit
Admission: RE | Admit: 2022-12-14 | Discharge: 2022-12-14 | Disposition: A | Discharge status: Home / Self Care | Payer: Medicaid Other | Source: Ambulatory Visit | Attending: Urology

## 2022-12-14 MED ORDER — GADOPICLENOL 0.5 MMOL/ML IV SOLN
10.0000 mL | Freq: Once | INTRAVENOUS | Status: AC | PRN
  Administered 2022-12-14: 10 mL via INTRAVENOUS

## 2022-12-23 ENCOUNTER — Encounter: Payer: Medicaid Other | Admitting: Internal Medicine

## 2022-12-24 ENCOUNTER — Other Ambulatory Visit: Payer: Self-pay

## 2022-12-24 ENCOUNTER — Encounter: Payer: Self-pay | Admitting: Student

## 2022-12-24 ENCOUNTER — Ambulatory Visit (INDEPENDENT_AMBULATORY_CARE_PROVIDER_SITE_OTHER): Payer: Medicaid Other | Admitting: Student

## 2022-12-24 VITALS — BP 151/49 | HR 66 | Temp 98.7°F | Ht 70.0 in | Wt 213.1 lb

## 2022-12-24 DIAGNOSIS — Z992 Dependence on renal dialysis: Secondary | ICD-10-CM | POA: Diagnosis not present

## 2022-12-24 DIAGNOSIS — I10 Essential (primary) hypertension: Secondary | ICD-10-CM

## 2022-12-24 DIAGNOSIS — I120 Hypertensive chronic kidney disease with stage 5 chronic kidney disease or end stage renal disease: Secondary | ICD-10-CM

## 2022-12-24 DIAGNOSIS — N186 End stage renal disease: Secondary | ICD-10-CM

## 2022-12-24 DIAGNOSIS — M4726 Other spondylosis with radiculopathy, lumbar region: Secondary | ICD-10-CM

## 2022-12-24 DIAGNOSIS — M5136 Other intervertebral disc degeneration, lumbar region: Secondary | ICD-10-CM | POA: Diagnosis not present

## 2022-12-24 DIAGNOSIS — M51369 Other intervertebral disc degeneration, lumbar region without mention of lumbar back pain or lower extremity pain: Secondary | ICD-10-CM | POA: Insufficient documentation

## 2022-12-24 DIAGNOSIS — Z23 Encounter for immunization: Secondary | ICD-10-CM | POA: Diagnosis present

## 2022-12-24 MED ORDER — DULOXETINE HCL 20 MG PO CPEP: 20.0000 mg | ORAL_CAPSULE | Freq: Every day | ORAL | 3 refills | Status: DC

## 2022-12-24 NOTE — Progress Notes (Signed)
CC: Follow-up  HPI:  [name] is a 57 y.o. male living with a history stated below and presents today for follow-up. Please see problem based assessment and plan for additional details.  Past Medical History:  Diagnosis Date   Acute exacerbation of CHF (congestive heart failure) (HCC) 12/31/2021   Anxiety    Panic Atacks   Arthritis    Carpal tunnel syndrome    Chronic kidney disease    Constipation    Depression    Diabetes mellitus    DMII   Dysrhythmia    hx tachy hr   GERD (gastroesophageal reflux disease)    no problem in past few years.   Hepatitis    Hyperlipidemia    Hypertension    Nausea and vomiting 11/12/2022   Neuropathy associated with endocrine disorder (HCC)    Seizures (HCC)    "stress related" no more since taking Klonopin 2 years- See Neurologist  [facility]   Shortness of breath dyspnea    with exertion   Sleep apnea    used a cpap when he was 350lb-does not need one now 260lb   Wears contact lenses     Current Outpatient Medications on File Prior to Visit  Medication Sig Dispense Refill   atorvastatin (LIPITOR) 40 MG tablet TAKE 1 TABLET(40 MG) BY MOUTH DAILY 30 tablet 2   bumetanide (BUMEX) 2 MG tablet Take 2 tablets (4 mg total) by mouth 2 (two) times daily. 120 tablet 0   carvedilol (COREG) 25 MG tablet Take 1 tablet (25 mg total) by mouth 2 (two) times daily with a meal. 60 tablet 5   feeding supplement (BOOST HIGH PROTEIN) LIQD Take 1 Container by mouth daily as needed (Supplement).     gabapentin (NEURONTIN) 100 MG capsule Take 100 mg by mouth at bedtime.     hydrALAZINE (APRESOLINE) 50 MG tablet Take 0.5 tablets (25 mg total) by mouth 3 (three) times daily. 90 tablet 5   insulin glargine (LANTUS) 100 UNIT/ML Solostar Pen Inject 5 Units into the skin daily. 15 mL 1   Insulin Pen Needle (PEN NEEDLES) 32G X 4 MM MISC 1 Needle by Does not apply route daily at 6 (six) AM. 200 each 1   levofloxacin (LEVAQUIN) 750 MG tablet Take 750 mg  by mouth as directed.     prochlorperazine (COMPAZINE) 5 MG tablet Take 5 mg by mouth as needed for nausea or vomiting.     sertraline (ZOLOFT) 100 MG tablet Take 1 tablet (100 mg total) by mouth daily. 30 tablet 2   No current facility-administered medications on file prior to visit.    Family History  Problem Relation Age of Onset   Cancer Mother        pancres   Diabetes Mother    Hypertension Brother    Renal cancer Brother    Cancer Maternal Uncle    Colon cancer Maternal Uncle    Colon cancer Maternal Uncle    Stomach cancer Maternal Uncle    Esophageal cancer Neg Hx    Rectal cancer Neg Hx     Social History   Socioeconomic History   Marital status: Single    Spouse name: Not on file   Number of children: Not on file   Years of education: Not on file   Highest education level: Not on file  Occupational History   Not on file  Tobacco Use   Smoking status: Former    Current packs/day: 0.00  Types: Cigarettes    Quit date: 10/27/2008    Years since quitting: 14.1   Smokeless tobacco: Never   Tobacco comments:    Quit x 10 yrs.  Vaping Use   Vaping status: Never Used  Substance and Sexual Activity   Alcohol use: Not Currently    Comment: Beer sometimes.   Drug use: No    Types: Marijuana   Sexual activity: Not Currently    Partners: Male    Birth control/protection: None    Comment: last HIV test years ago.  Other Topics Concern   Not on file  Social History Narrative   Not on file   Social Determinants of Health   Financial Resource Strain: Not on file  Food Insecurity: No Food Insecurity (02/17/2022)   Hunger Vital Sign    Worried About Running Out of Food in the Last Year: Never true    Ran Out of Food in the Last Year: Never true  Transportation Needs: No Transportation Needs (02/17/2022)   PRAPARE - Administrator, Civil Service (Medical): No    Lack of Transportation (Non-Medical): No  Physical Activity: Not on file  Stress:  Not on file  Social Connections: Moderately Integrated (02/17/2022)   Social Connection and Isolation Panel [NHANES]    Frequency of Communication with Friends and Family: More than three times a week    Frequency of Social Gatherings with Friends and Family: More than three times a week    Attends Religious Services: More than 4 times per year    Active Member of [employer] or Organizations: Yes    Attends Banker Meetings: More than 4 times per year    Marital Status: Never married  Intimate Partner Violence: Not At Risk (02/17/2022)   Humiliation, Afraid, Rape, and Kick questionnaire    Fear of Current or Ex-Partner: No    Emotionally Abused: No    Physically Abused: No    Sexually Abused: No    Review of Systems: ROS negative except for what is noted on the assessment and plan.  Vitals:   12/24/22 0940  BP: (!) 151/49  Pulse: 66  Temp: 98.7 F (37.1 C)  TempSrc: Oral  SpO2: 100%  Weight: 213 lb 1.6 oz (96.7 kg)  Height: 5\' 10"  (1.778 m)    Physical Exam: Constitutional: well-appearing male in no acute distress Cardiovascular: regular rate and rhythm, no m/r/g Pulmonary/Chest: normal work of breathing on room air, lungs clear to auscultation bilaterally Abdominal: soft, non-tender, non-distended MSK: normal bulk and tone, tenderness in L5 region, straight leg test positive on left side Skin: warm and dry, fistula present in left upper extremity, palpable thrill   Assessment & Plan:   ESRD (end stage renal disease) on dialysis [facility]) Patient presents for follow-up after initiating dialysis.  He states dialysis is going well, and he receives it Monday Wednesday Friday via left brachiocephalic fistula.  His nausea and vomiting has resolved, and he follows with [facility] Dr. Melanee Spry for routine nephrology follow-up.  Essential hypertension Management per nephrology, regimen is Coreg 25 mg twice a day, hydralazine 25 mg 3 times a day, and 2 mg  twice a day of Bumex.  Patient states that he urinates only once daily, so unsure efficiency of Bumex, however will defer to nephrology.  Disc degeneration, lumbar Patient with severe degenerative changes from L3-L 5 noted on CT abdomen pelvis in July.  He states this has become functionally limiting for him, as he is  unable to tolerate sitting in a dialysis chair without severe amount of pain, as well as getting out of bed, taking a shower and sitting in his car.  On my exam, there is tenderness in the L5 region, straight leg test is positive on the left side.  He has tried pain relievers such as gabapentin as well as Tylenol with no avail.  He is also gone through physical therapy before which did not his pain subside.  Will trial a course of Cymbalta, but endorsed patient to be careful given that he is on an SSRI as well as has end-stage renal disease.  Plan: - Cymbalta 20 mg short course - Referral to neurosurgery  Patient discussed with Dr. Maryagnes Amos Rebeccah Ivins, M.D. [facility], PGY-2 Pager: [phone] Date 12/24/2022 Time 3:13 PM

## 2022-12-24 NOTE — Assessment & Plan Note (Signed)
Management per nephrology, regimen is Coreg 25 mg twice a day, hydralazine 25 mg 3 times a day, and 2 mg twice a day of Bumex.  Patient states that he urinates only once daily, so unsure efficiency of Bumex, however will defer to nephrology.

## 2022-12-24 NOTE — Patient Instructions (Addendum)
Thank you so much for coming to the clinic today!   I am sending in that medication called Cymabalta, please try to take only when you're having severe pain.  I have also placed a referral for neurosurgery, they should be giving you a call.  If you have any questions please feel free to the call the clinic at anytime at [phone]. It was a pleasure seeing you!  Best, Dr. Thomasene Ripple

## 2022-12-24 NOTE — Assessment & Plan Note (Signed)
Patient presents for follow-up after initiating dialysis.  He states dialysis is going well, and he receives it Monday Wednesday Friday via left brachiocephalic fistula.  His nausea and vomiting has resolved, and he follows with [facility] Dr. Melanee Spry for routine nephrology follow-up.

## 2022-12-24 NOTE — Assessment & Plan Note (Signed)
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  He states this has become functionally limiting for him, as he is unable to tolerate sitting in a dialysis chair without severe amount of pain, as well as getting out of bed, taking a shower and sitting in his car.  On my exam, there is tenderness in the L5 region, straight leg test is positive on the left side.  He has tried pain relievers such as gabapentin as well as Tylenol with no avail.  He is also gone through physical therapy before which did not his pain subside.  Will trial a course of Cymbalta, but endorsed patient to be careful given that he is on an SSRI as well as has end-stage renal disease.  Plan: - Cymbalta 20 mg short course - Referral to neurosurgery

## 2022-12-25 ENCOUNTER — Ambulatory Visit: Payer: Medicaid Other | Admitting: Podiatry

## 2022-12-25 NOTE — Progress Notes (Signed)
 Internal Medicine Clinic Attending  Case discussed with the resident physician at the time of the visit.  We reviewed the patient's history, exam, and pertinent patient test results.  I agree with the assessment, diagnosis, and plan of care documented in the resident's note.

## 2022-12-27 ENCOUNTER — Other Ambulatory Visit: Payer: Self-pay

## 2023-01-07 ENCOUNTER — Ambulatory Visit: Payer: Medicaid Other | Admitting: Podiatry

## 2023-01-13 ENCOUNTER — Telehealth: Payer: Self-pay | Admitting: Radiation Oncology

## 2023-01-13 NOTE — Telephone Encounter (Signed)
10/9 @ 8:04 Left voicemail for Justin ([facility]) to confirm if PET images has been done or schedule due to gleason score, before we can schedule patient.  Waiting on call back.

## 2023-01-14 ENCOUNTER — Telehealth: Payer: Self-pay | Admitting: Radiation Oncology

## 2023-01-14 NOTE — Telephone Encounter (Signed)
10/9 Received call back from [facility]).  PET scan will be done through [facility], but has not been schedule yet.  Waiting on PET images before schedule patient.

## 2023-01-20 ENCOUNTER — Other Ambulatory Visit (HOSPITAL_COMMUNITY): Payer: Self-pay

## 2023-01-21 ENCOUNTER — Other Ambulatory Visit (HOSPITAL_COMMUNITY): Payer: Self-pay | Admitting: Urology

## 2023-01-21 DIAGNOSIS — C61 Malignant neoplasm of prostate: Secondary | ICD-10-CM

## 2023-02-02 ENCOUNTER — Encounter: Payer: Self-pay | Admitting: Radiation Oncology

## 2023-02-02 ENCOUNTER — Encounter (HOSPITAL_COMMUNITY)
Admission: RE | Admit: 2023-02-02 | Discharge: 2023-02-02 | Disposition: A | Discharge status: Home / Self Care | Payer: Medicaid Other | Source: Ambulatory Visit | Attending: Urology | Admitting: Urology

## 2023-02-02 ENCOUNTER — Ambulatory Visit: Payer: Medicaid Other | Admitting: Podiatry

## 2023-02-02 DIAGNOSIS — E1149 Type 2 diabetes mellitus with other diabetic neurological complication: Secondary | ICD-10-CM

## 2023-02-02 DIAGNOSIS — C61 Malignant neoplasm of prostate: Secondary | ICD-10-CM | POA: Diagnosis present

## 2023-02-02 DIAGNOSIS — M79675 Pain in left toe(s): Secondary | ICD-10-CM | POA: Diagnosis not present

## 2023-02-02 DIAGNOSIS — M216X1 Other acquired deformities of right foot: Secondary | ICD-10-CM

## 2023-02-02 DIAGNOSIS — M79674 Pain in right toe(s): Secondary | ICD-10-CM

## 2023-02-02 DIAGNOSIS — B351 Tinea unguium: Secondary | ICD-10-CM

## 2023-02-02 MED ORDER — FLOTUFOLASTAT F 18 GALLIUM 296-5846 MBQ/ML IV SOLN
8.0000 | Freq: Once | INTRAVENOUS | Status: AC
  Administered 2023-02-02: 7.62 via INTRAVENOUS
  Filled 2023-02-02: qty 8

## 2023-02-02 NOTE — Progress Notes (Signed)
Subjective: Chief Complaint  Patient presents with   Nail Problem    RFC.    57 year old male presents for above concerns.  Nails are thick and elongated causing discomfort.  He has a thick callus inside of his left big toe which has been growing over time causing discomfort again.  He also has a callus along with his right foot.  He did miss his last appointment because he was recent diagnosed prostate cancer and forgot he had an appointment.  No open lesions that he reports.   Last A1c was 5.8 on November 11, 2022 Lyndle Herrlich, MD-last seen December 24, 2022   Objective: AAO x3, NAD DP/PT pulses palpable bilaterally, CRT less than 3 seconds Sensation decreased with Semmes Weinstein monofilament Nails are hypertrophic, dystrophic, brittle, discolored, elongated 10. No surrounding redness or drainage. Tenderness nails 1-5 bilaterally.  Along the medial aspect of the left hallux there is a large callus noted on the medial aspect the left hallux IPJ.  Upon debridement there is no underlying ulceration drainage or signs of infection.  On the plantar aspect the right foot prominence hyperkeratotic lesion.  There was some dried blood underneath the callus but upon debridement there is no skin breakdown or any signs of infection.  Rocker-bottom deformity of the right foot. No pain with calf compression, swelling, warmth, erythema  Assessment: Preulcerative large callus; symptomatic onychomycosis  Plan: -All treatment options discussed with the patient including all alternatives, risks, complications.  -Nails debrided x 10 without any complications or bleeding -Sharply debrided the hyperkeratotic lesions x 2 without any complications or bleeding.  Discussed importance of offloading given the callus which can lead infection. -I am going to have him follow up for inserts.  -Daily foot inspection. -Patient encouraged to call the office with any questions, concerns, change in symptoms.    Vivi Barrack DPM

## 2023-02-02 NOTE — Progress Notes (Signed)
GU Location of Tumor / Histology: Prostate Ca  If Prostate Cancer, Gleason Score is (4 + 5) and PSA is (16.4 on 08/12/2022)  PSA 6.0 on 08/06/2020 PSA 8.3 on 06/10/2020  Biopsy       10/21/2022 Dr. Cristal Deer Winter MR Prostate with/without Contrast CLINICAL DATA: Elevated PSA level of 16.4 on 08/12/2022. R97.20   IMPRESSION: 1. No focal lesion of intermediate or higher suspicion for prostate cancer is identified. 2. Hazy low T2 signal in the peripheral zone is nonfocal, likely postinflammatory, and is considered PI-RADS category 2. 3. Trace free pelvic fluid, etiology uncertain. 4. Subcutaneous edema along the pelvis and lateral to the hips. 5. Enhancement in the left greater trochanter possibly attributable to inflammation given the suspected adjacent gluteus minimus tendinopathy. 6. Despite efforts by the technologist and patient, motion artifact is present on today's exam and could not be eliminated. This reduces exam sensitivity and specificity.   Past/Anticipated interventions by urology, if any: No  Past/Anticipated interventions by medical oncology, if any:  NA  Weight changes, if any:  Weight loss over several years over 100 lbs.  IPSS:  23 SHIM:  5  Bowel/Bladder complaints, if any:  Frequent bowel movements and urinary frequency not bothersome has become use to symptoms.  Reports since starting dialysis having these issues.  Nausea/Vomiting, if any: Yes  Pain issues, if any:  6/10 lower back and left leg.  SAFETY ISSUES: Prior radiation? No Pacemaker/ICD? No Possible current pregnancy? Male Is the patient on methotrexate? No  Current Complaints / other details:

## 2023-02-08 ENCOUNTER — Encounter: Payer: Self-pay | Admitting: Urology

## 2023-02-08 DIAGNOSIS — C61 Malignant neoplasm of prostate: Secondary | ICD-10-CM | POA: Insufficient documentation

## 2023-02-08 NOTE — Progress Notes (Signed)
Radiation Oncology         [phone] ________________________________  Initial Outpatient Consultation - Conducted via Telephone due to current COVID-19 concerns for limiting patient exposure  Name: [name] MRN: [id number]  Date: 02/09/2023  DOB: Nov 30, 1965  WC:[id number], Cristal Deer, DO  Shelly Rubenstein*   REFERRING PHYSICIAN: Shelly Rubenstein*  DIAGNOSIS: 57 y.o. gentleman with Stage T1c adenocarcinoma of the prostate with Gleason score of 4+5, and PSA of 16.4.  No diagnosis found.  HISTORY OF PRESENT ILLNESS: [name] Dicamillo is a 57 y.o. male with a diagnosis of prostate cancer. He was noted to have an elevated PSA of 16.4 by his primary care physician, Dr. Cyndie Chime. Prior values from 2022 were 8.3 in March and 6 in May, but no follow up was done at that time. Accordingly, he was referred for evaluation in urology by Dr. Liliane Shi on 09/07/22. He underwent prostate MRI on 12/14/22 showing: within limitation of motion artifact, no focal lesion suspicious for prostate cancer; nonfocal hazy low T2 signal in peripheral zone, likely postinflammatory (PI-RADS 2). The patient proceeded to transrectal ultrasound with 12 biopsies of the prostate on 12/31/22.  The prostate volume measured 29.6 cc.  Out of 12 core biopsies, 4 were positive.  The maximum Gleason score was 4+5, and this was seen in left mid and left apex lateral. Additionally, Gleason 4+3 was seen in left apex and Gleason 3+4 in left mid lateral.  He underwent staging PSMA PET scan on 02/02/23 showing no evidence of radiotracer activity outside of the prostate gland.  The patient reviewed the biopsy results with his urologist and he has kindly been referred today for discussion of potential radiation treatment options.   PREVIOUS RADIATION THERAPY: No  PAST MEDICAL HISTORY:  Past Medical History:  Diagnosis Date   Acute exacerbation of CHF (congestive heart failure) (HCC) 12/31/2021   Anxiety    Panic Atacks    Arthritis    Carpal tunnel syndrome    Chronic kidney disease    Constipation    Depression    Diabetes mellitus    DMII   Dysrhythmia    hx tachy hr   GERD (gastroesophageal reflux disease)    no problem in past few years.   Heart disease    Hepatitis    Hyperlipidemia    Hypertension    Nausea and vomiting 11/12/2022   Neuropathy associated with endocrine disorder (HCC)    Seizures (HCC)    "stress related" no more since taking Klonopin 2 years- See Neurologist  [facility]   Shortness of breath dyspnea    with exertion   Sleep apnea    used a cpap when he was 350lb-does not need one now 260lb   Wears contact lenses       PAST SURGICAL HISTORY: Past Surgical History:  Procedure Laterality Date   AV FISTULA PLACEMENT Left 09/11/2022   Procedure: LEFT ARM BRACHIOCEPHALIC ARTERIOVENOUS (AV) FISTULA CREATION;  Surgeon: Maeola Harman, MD;  Location: [facility] OR;  Service: Vascular;  Laterality: Left;   CATARACT EXTRACTION Bilateral    COLONOSCOPY     KNEE ARTHROSCOPY W/ MENISCAL REPAIR  04/06/1998   left   LUMBAR LAMINECTOMY  04/06/2000   PROSTATE BIOPSY      FAMILY HISTORY:  Family History  Problem Relation Age of Onset   Cancer Mother        pancres   Diabetes Mother    Hypertension Brother    Renal cancer Brother    Cancer  Maternal Uncle    Colon cancer Maternal Uncle    Colon cancer Maternal Uncle    Stomach cancer Maternal Uncle    Esophageal cancer Neg Hx    Rectal cancer Neg Hx     SOCIAL HISTORY:  Social History   Socioeconomic History   Marital status: Single    Spouse name: Not on file   Number of children: Not on file   Years of education: Not on file   Highest education level: Not on file  Occupational History   Not on file  Tobacco Use   Smoking status: Former    Current packs/day: 0.00    Types: Cigarettes    Quit date: 10/27/2008    Years since quitting: 14.2   Smokeless tobacco: Never   Tobacco comments:    Quit x 10 yrs.   Vaping Use   Vaping status: Never Used  Substance and Sexual Activity   Alcohol use: Not Currently    Comment: Beer sometimes.   Drug use: No    Types: Marijuana   Sexual activity: Not Currently    Partners: Male    Birth control/protection: None    Comment: last HIV test years ago.  Other Topics Concern   Not on file  Social History Narrative   Not on file   Social Determinants of Health   Financial Resource Strain: Not on file  Food Insecurity: No Food Insecurity (02/17/2022)   Hunger Vital Sign    Worried About Running Out of Food in the Last Year: Never true    Ran Out of Food in the Last Year: Never true  Transportation Needs: No Transportation Needs (02/17/2022)   PRAPARE - Administrator, Civil Service (Medical): No    Lack of Transportation (Non-Medical): No  Physical Activity: Not on file  Stress: Not on file  Social Connections: Moderately Integrated (02/17/2022)   Social Connection and Isolation Panel [NHANES]    Frequency of Communication with Friends and Family: More than three times a week    Frequency of Social Gatherings with Friends and Family: More than three times a week    Attends Religious Services: More than 4 times per year    Active Member of [employer] or Organizations: Yes    Attends Banker Meetings: More than 4 times per year    Marital Status: Never married  Intimate Partner Violence: Not At Risk (02/17/2022)   Humiliation, Afraid, Rape, and Kick questionnaire    Fear of Current or Ex-Partner: No    Emotionally Abused: No    Physically Abused: No    Sexually Abused: No    ALLERGIES: Norvasc [amlodipine] and Zestril [lisinopril]  MEDICATIONS:  Current Outpatient Medications  Medication Sig Dispense Refill   atorvastatin (LIPITOR) 40 MG tablet TAKE 1 TABLET(40 MG) BY MOUTH DAILY 30 tablet 2   bumetanide (BUMEX) 2 MG tablet Take 2 tablets (4 mg total) by mouth 2 (two) times daily. 120 tablet 0   carvedilol (COREG) 25  MG tablet Take 1 tablet (25 mg total) by mouth 2 (two) times daily with a meal. 60 tablet 5   DULoxetine (CYMBALTA) 20 MG capsule Take 1 capsule (20 mg total) by mouth daily. 30 capsule 3   feeding supplement (BOOST HIGH PROTEIN) LIQD Take 1 Container by mouth daily as needed (Supplement).     gabapentin (NEURONTIN) 100 MG capsule Take 100 mg by mouth at bedtime.     insulin glargine (LANTUS) 100 UNIT/ML Solostar Pen Inject 5  Units into the skin daily. 15 mL 1   Insulin Pen Needle (PEN NEEDLES) 32G X 4 MM MISC 1 Needle by Does not apply route daily at 6 (six) AM. 200 each 1   prochlorperazine (COMPAZINE) 5 MG tablet Take 5 mg by mouth as needed for nausea or vomiting.     sertraline (ZOLOFT) 100 MG tablet Take 1 tablet (100 mg total) by mouth daily. 30 tablet 2   No current facility-administered medications for this encounter.    REVIEW OF SYSTEMS:  On review of systems, the patient reports that he is doing well overall. He denies any chest pain, shortness of breath, cough, fevers, chills, night sweats, unintended weight changes. He denies any bowel disturbances, and denies abdominal pain, nausea or vomiting. He denies any new musculoskeletal or joint aches or pains. His IPSS was ***, indicating *** urinary symptoms. His SHIM was ***, indicating he {does not have/has mild/moderate/severe} erectile dysfunction. A complete review of systems is obtained and is otherwise negative.    PHYSICAL EXAM:  Wt Readings from Last 3 Encounters:  12/24/22 213 lb 1.6 oz (96.7 kg)  11/16/22 215 lb (97.5 kg)  11/12/22 208 lb 3.2 oz (94.4 kg)   Temp Readings from Last 3 Encounters:  12/24/22 98.7 F (37.1 C) (Oral)  11/12/22 98.1 F (36.7 C) (Oral)  11/11/22 98.2 F (36.8 C) (Oral)   BP Readings from Last 3 Encounters:  12/24/22 (!) 151/49  11/16/22 136/78  11/12/22 134/72   Pulse Readings from Last 3 Encounters:  12/24/22 66  11/16/22 73  11/12/22 64    /10  Physical exam not performed in  light of telephone encounter.   KPS = ***  100 - Normal; no complaints; no evidence of disease. 90   - Able to carry on normal activity; Mcgivern signs or symptoms of disease. 80   - Normal activity with effort; some signs or symptoms of disease. 71   - Cares for self; unable to carry on normal activity or to do active work. 60   - Requires occasional assistance, but is able to care for most of his personal needs. 50   - Requires considerable assistance and frequent medical care. 40   - Disabled; requires special care and assistance. 30   - Severely disabled; hospital admission is indicated although death not imminent. 20   - Very sick; hospital admission necessary; active supportive treatment necessary. 10   - Moribund; fatal processes progressing rapidly. 0     - Dead  Karnofsky DA, Abelmann WH, Craver LS and Burchenal [facility] [phone]) The use of the nitrogen mustards in the palliative treatment of carcinoma: with particular reference to bronchogenic carcinoma Cancer 1 634-56  LABORATORY DATA:  Lab Results  Component Value Date   WBC 6.9 01/08/2022   HGB 9.2 (L) 09/11/2022   HCT 27.0 (L) 09/11/2022   MCV 86 01/08/2022   PLT 131 (L) 01/08/2022   Lab Results  Component Value Date   NA 137 09/11/2022   K 4.9 09/11/2022   CL 107 09/11/2022   CO2 19 (L) 08/12/2022   Lab Results  Component Value Date   ALT 19 01/08/2022   AST 25 01/08/2022   GGT 75 05/16/2019   ALKPHOS 66 01/08/2022   BILITOT <0.2 01/08/2022     RADIOGRAPHY: NM PET (PSMA) SKULL TO MID THIGH  Result Date: 02/08/2023 CLINICAL DATA:  Elevated PSA equal 16.4. EXAM: NUCLEAR MEDICINE PET SKULL BASE TO THIGH TECHNIQUE: 7.6 mCi Flotufolastat (Posluma) was injected  intravenously. Full-ring PET imaging was performed from the skull base to thigh after the radiotracer. CT data was obtained and used for attenuation correction and anatomic localization. COMPARISON:  None Available. FINDINGS: NECK No radiotracer activity in neck  lymph nodes. Incidental CT finding: None CHEST No radiotracer accumulation within mediastinal or hilar lymph nodes. No suspicious pulmonary nodules on the CT scan. Incidental CT finding: None. ABDOMEN/PELVIS Prostate: Intense radiotracer activity in the LEFT lobe of thyroid gland prostate gland with SUV max equal 19.5 on image 194. Lymph nodes: No abnormal radiotracer accumulation within pelvic or abdominal nodes. Liver: No evidence of liver metastasis. Incidental CT finding: None. SKELETON No focal activity to suggest skeletal metastasis. IMPRESSION: 1. Intense radiotracer activity in the LEFT lobe of the prostate gland consistent with primary prostate adenocarcinoma. 2. No evidence of metastatic adenopathy in the pelvis or periaortic retroperitoneum. 3. No evidence of visceral metastasis or skeletal metastasis. Electronically Signed   By: Genevive Bi M.D.   On: 02/08/2023 13:25      IMPRESSION/PLAN: This visit was conducted via Telephone to spare the patient unnecessary potential exposure in the healthcare setting during the current COVID-19 pandemic. 1. 57 y.o. gentleman with Stage T1c adenocarcinoma of the prostate with Gleason Score of 4+5, and PSA of 16.4. We discussed the patient's workup and outlined the nature of prostate cancer in this setting. The patient's T stage, Gleason's score, and PSA put him into the high risk group. Accordingly, he is eligible for a variety of potential treatment options including LT-ADT in combination with 8 weeks of external radiation, 5 weeks of external radiation preceded by a brachytherapy boost, or prostatectomy. We discussed the available radiation techniques, and focused on the details and logistics and delivery. We discussed and outlined the risks, benefits, short and long-term effects associated with radiotherapy and compared and contrasted these with prostatectomy. We discussed the role of SpaceOAR in reducing the rectal toxicity associated with radiotherapy.  We also detailed the role of ADT in the treatment of high risk prostate cancer and outlined the associated side effects that could be expected with this therapy. He appears to have a good understanding of his disease and our treatment recommendations which are of curative intent.  He was encouraged to ask questions that were answered to his stated satisfaction.  At the end of the conversation the patient is interested in moving forward with ***.   Given current concerns for patient exposure during the COVID-19 pandemic, this encounter was conducted via telephone. The patient was notified in advance and was offered a MyChart meeting to allow for face to face communication but unfortunately reported that he did not have the appropriate resources/technology to support such a visit and instead preferred to proceed with telephone consult. The patient has given verbal consent for this type of encounter. The time spent during this encounter was *** minutes. The attendants for this meeting include Margaretmary Dys MD, Ashlyn Bruning PA-C, and patient Suhaas Agena Cichowski {and ***.} During the encounter, Margaretmary Dys MD and Marcello Fennel PA-C were located at [facility] Radiation Oncology Department.  Patient Raynald Rouillard Jasinski {and *** were} was located at home.    Marguarite Arbour, PA-C    Margaretmary Dys, MD  [facility]  Radiation Oncology Direct Dial: [phone]  Fax: [phone] [email or web address]  Skype  [email or web address]   This document serves as a record of services personally performed by Margaretmary Dys, MD and Marcello Fennel, PA-C. It was created on their behalf by Florentina Addison  [place], a trained medical scribe. The creation of this record is based on the scribe's personal observations and the provider's statements to them. This document has been checked and approved by the attending provider.

## 2023-02-09 ENCOUNTER — Ambulatory Visit
Admission: RE | Admit: 2023-02-09 | Discharge: 2023-02-09 | Disposition: A | Discharge status: Home / Self Care | Payer: Medicaid Other | Source: Ambulatory Visit | Attending: Radiation Oncology | Admitting: Radiation Oncology

## 2023-02-09 ENCOUNTER — Encounter: Payer: Self-pay | Admitting: Radiation Oncology

## 2023-02-09 ENCOUNTER — Other Ambulatory Visit: Payer: Self-pay

## 2023-02-09 VITALS — Ht 71.0 in | Wt 185.0 lb

## 2023-02-09 DIAGNOSIS — C61 Malignant neoplasm of prostate: Secondary | ICD-10-CM

## 2023-02-09 HISTORY — DX: Heart disease, unspecified: I51.9

## 2023-02-23 ENCOUNTER — Other Ambulatory Visit: Payer: Medicaid Other

## 2023-02-24 ENCOUNTER — Other Ambulatory Visit: Payer: Medicaid Other

## 2023-03-09 NOTE — Progress Notes (Signed)
Patient will proceed with surgery with Dr. Laverle Patter for his high risk prostate cancer.

## 2023-03-22 ENCOUNTER — Other Ambulatory Visit (HOSPITAL_COMMUNITY): Payer: Self-pay

## 2023-03-22 ENCOUNTER — Telehealth: Payer: Self-pay | Admitting: *Deleted

## 2023-03-22 ENCOUNTER — Other Ambulatory Visit: Payer: Self-pay | Admitting: Urology

## 2023-03-22 ENCOUNTER — Other Ambulatory Visit: Payer: Self-pay

## 2023-03-22 ENCOUNTER — Telehealth: Payer: Self-pay | Admitting: Cardiovascular Disease

## 2023-03-22 DIAGNOSIS — E785 Hyperlipidemia, unspecified: Secondary | ICD-10-CM

## 2023-03-22 MED ORDER — ATORVASTATIN CALCIUM 40 MG PO TABS
40.0000 mg | ORAL_TABLET | Freq: Every day | ORAL | 2 refills | Status: DC
Start: 2023-03-22 — End: 2023-11-30

## 2023-03-22 MED ORDER — GABAPENTIN 100 MG PO CAPS: 100.0000 mg | ORAL_CAPSULE | Freq: Every day | ORAL | 3 refills | Status: DC

## 2023-03-22 NOTE — Telephone Encounter (Signed)
Pt has been scheduled tele preop appt 04/01/23. Med rec and consent are done.      Patient Consent for Virtual Visit        [name] has provided verbal consent on 03/22/2023 for a virtual visit (video or telephone).   CONSENT FOR VIRTUAL VISIT FOR:  [name]  By participating in this virtual visit I agree to the following:  I hereby voluntarily request, consent and authorize [facility] and its employed or contracted physicians, physician assistants, nurse practitioners or other licensed health care professionals (the Practitioner), to provide me with telemedicine health care services (the "Services") as deemed necessary by the treating Practitioner. I acknowledge and consent to receive the Services by the Practitioner via telemedicine. I understand that the telemedicine visit will involve communicating with the Practitioner through live audiovisual communication technology and the disclosure of certain medical information by electronic transmission. I acknowledge that I have been given the opportunity to request an in-person assessment or other available alternative prior to the telemedicine visit and am voluntarily participating in the telemedicine visit.  I understand that I have the right to withhold or withdraw my consent to the use of telemedicine in the course of my care at any time, without affecting my right to future care or treatment, and that the Practitioner or I may terminate the telemedicine visit at any time. I understand that I have the right to inspect all information obtained and/or recorded in the course of the telemedicine visit and may receive copies of available information for a reasonable fee.  I understand that some of the potential risks of receiving the Services via telemedicine include:  Delay or interruption in medical evaluation due to technological equipment failure or disruption; Information transmitted may not be sufficient (e.g. poor  resolution of images) to allow for appropriate medical decision making by the Practitioner; and/or  In rare instances, security protocols could fail, causing a breach of personal health information.  Furthermore, I acknowledge that it is my responsibility to provide information about my medical history, conditions and care that is complete and accurate to the best of my ability. I acknowledge that Practitioner's advice, recommendations, and/or decision may be based on factors not within their control, such as incomplete or inaccurate data provided by me or distortions of diagnostic images or specimens that may result from electronic transmissions. I understand that the practice of medicine is not an exact science and that Practitioner makes no warranties or guarantees regarding treatment outcomes. I acknowledge that a copy of this consent can be made available to me via my patient portal [facility] MyChart), or I can request a printed copy by calling the office of [facility].    I understand that my insurance will be billed for this visit.   I have read or had this consent read to me. I understand the contents of this consent, which adequately explains the benefits and risks of the Services being provided via telemedicine.  I have been provided ample opportunity to ask questions regarding this consent and the Services and have had my questions answered to my satisfaction. I give my informed consent for the services to be provided through the use of telemedicine in my medical care

## 2023-03-22 NOTE — Telephone Encounter (Signed)
Pt has been scheduled tele pre op appt 04/01/23. Med rec and consent are done.

## 2023-03-22 NOTE — Telephone Encounter (Signed)
   Name: [name]  DOB: [birth year]  MRN: [id number]  Primary Cardiologist: Kristeen Miss, MD   Preoperative team, please contact this patient and set up a phone call appointment for further preoperative risk assessment. Please obtain consent and complete medication review. Thank you for your help.  I confirm that guidance regarding antiplatelet and oral anticoagulation therapy has been completed and, if necessary, noted below (none requested).   I also confirmed the patient resides in the state of [place]. As per [facility] Medical Board telemedicine laws, the patient must reside in the state in which the provider is licensed.   Joylene Grapes, NP 03/22/2023, 11:47 AM [facility]

## 2023-03-22 NOTE — Telephone Encounter (Signed)
   Pre-operative Risk Assessment    Patient Name: [name]  DOB: 28-Mar-1966 MRN: [id number]      Request for Surgical Clearance    Procedure:   Robot assisted laparoscopic radical prostatectomy and bilateral pelvic lymphadnectomy  Date of Surgery:  Clearance 04/15/23                                 Surgeon:  Dr. Everardo All Group or Practice Name:  [facility] Phone number:  [phone] Fax number:  [phone]   Type of Clearance Requested:   - Medical    Type of Anesthesia:  General    Additional requests/questions:   Caller Vincente Poli) stated patient will need medical clearance.  Signed, Annetta Maw   03/22/2023, 10:24 AM

## 2023-04-01 ENCOUNTER — Ambulatory Visit: Payer: Medicaid Other | Attending: Cardiology | Admitting: Cardiology

## 2023-04-01 DIAGNOSIS — Z01810 Encounter for preprocedural cardiovascular examination: Secondary | ICD-10-CM | POA: Diagnosis not present

## 2023-04-01 NOTE — Progress Notes (Signed)
Virtual Visit via Telephone Note   Because of [name]'s co-morbid illnesses, he is at least at moderate risk for complications without adequate follow up.  This format is felt to be most appropriate for this patient at this time.  The patient did not have access to video technology/had technical difficulties with video requiring transitioning to audio format only (telephone).  All issues noted in this document were discussed and addressed.  No physical exam could be performed with this format.  Please refer to the patient's chart for his consent to telehealth for [name].  Evaluation Performed:  Preoperative cardiovascular risk assessment _____________   Date:  04/01/2023   Patient ID:  [name], DOB 01/07/1966, MRN [id number] Patient Location:  Home Provider location:   Office  Primary Care Provider:  Katheran James, DO Primary Cardiologist:  Joseph Miss, MD  Chief Complaint / Patient Profile   57 y.o. y/o male with a h/o chronic diastolic CHF, CKD, and diabetes mellitus who is pending Robot assisted laparoscopic radical prostatectomy and bilateral pelvic lymphadenectomy on 04/15/23 with Dr. Laverle [name] and presents today for telephonic preoperative cardiovascular risk assessment.  History of Present Illness    [name] is a 57 y.o. male who presents via audio/video conferencing for a telehealth visit today.  Pt was last seen in cardiology clinic on 11/16/22 by Dr. Elease [name].  At that time [name] was doing well.  The patient is now pending procedure as outlined above. Since his last visit, he has remained stable from a cardiac perspective.   Today he denies chest pain, shortness of breath, lower extremity edema, fatigue, palpitations, melena, hematuria, hemoptysis, diaphoresis, weakness, presyncope, syncope, orthopnea, and PND.   Past Medical History    Past Medical History:  Diagnosis Date   Acute exacerbation of CHF (congestive heart  failure) (HCC) 12/31/2021   Anxiety    Panic Atacks   Arthritis    Carpal tunnel syndrome    Chronic kidney disease    Constipation    Depression    Diabetes mellitus    DMII   Dysrhythmia    hx tachy hr   GERD (gastroesophageal reflux disease)    no problem in past few years.   Heart disease    Hepatitis    Hyperlipidemia    Hypertension    Nausea and vomiting 11/12/2022   Neuropathy associated with endocrine disorder (HCC)    Seizures (HCC)    "stress related" no more since taking Klonopin 2 years- See Neurologist  [facility]   Shortness of breath dyspnea    with exertion   Sleep apnea    used a cpap when he was 350lb-does not need one now 260lb   Wears contact lenses    Past Surgical History:  Procedure Laterality Date   AV FISTULA PLACEMENT Left 09/11/2022   Procedure: LEFT ARM BRACHIOCEPHALIC ARTERIOVENOUS (AV) FISTULA CREATION;  Surgeon: Joseph Harman, MD;  Location: [facility] OR;  Service: Vascular;  Laterality: Left;   CATARACT EXTRACTION Bilateral    COLONOSCOPY     KNEE ARTHROSCOPY W/ MENISCAL REPAIR  04/06/1998   left   LUMBAR LAMINECTOMY  04/06/2000   PROSTATE BIOPSY      Allergies  Allergies  Allergen Reactions   Norvasc [Amlodipine] Other (See Comments)    Bilateral leg swelling   Zestril [Lisinopril] Swelling and Other (See Comments)    Face and legs swell    Home Medications    Prior to Admission medications  Medication Sig Start Date End Date Taking? Authorizing Provider  atorvastatin (LIPITOR) 40 MG tablet Take 1 tablet (40 mg total) by mouth daily. 03/22/23   Joseph James, DO  bumetanide (BUMEX) 2 MG tablet Take 2 tablets (4 mg total) by mouth 2 (two) times daily. Patient taking differently: Take 4 mg by mouth every Tuesday, Thursday, Saturday, and Sunday. 09/03/22   Joseph Herrlich, MD  carvedilol (COREG) 25 MG tablet Take 25 mg by mouth 2 (two) times daily with a meal.    [provider]  DULoxetine (CYMBALTA)  20 MG capsule Take 1 capsule (20 mg total) by mouth daily. 12/24/22   [name], Joseph Fila, MD  feeding supplement (BOOST HIGH PROTEIN) LIQD Take 1 Container by mouth daily as needed (Supplement).    [provider]  gabapentin (NEURONTIN) 100 MG capsule Take 1 capsule (100 mg total) by mouth at bedtime. 03/22/23   Joseph James, DO  insulin glargine (LANTUS) 100 UNIT/ML Solostar Pen Inject 5 Units into the skin daily. Patient taking differently: Inject 15 Units into the skin daily as needed (High Blood Sugar). 09/14/22   Joseph Burns, MD  Insulin Pen Needle (PEN NEEDLES) 32G X 4 MM MISC 1 Needle by Does not apply route daily at 6 (six) AM. 02/17/22   Joseph Burns, MD  Methoxy PEG-Epoetin Beta (MIRCERA IJ) Mircera 12/16/22 12/15/23  [provider]  prochlorperazine (COMPAZINE) 5 MG tablet Take 5 mg by mouth as needed for nausea or vomiting.    [provider]  sertraline (ZOLOFT) 100 MG tablet Take 1 tablet (100 mg total) by mouth daily. 11/11/22 11/11/23  Joseph Mungo, DO  sevelamer carbonate (RENVELA) 800 MG tablet Take 800 mg by mouth 3 (three) times daily with meals.    [provider]    Physical Exam    Vital Signs:  [name] does not have vital signs available for review today.  Given telephonic nature of communication, physical exam is limited. AAOx3. NAD. Normal affect.  Speech and respirations are unlabored.  Accessory Clinical Findings    None  Assessment & Plan    1.  Preoperative Cardiovascular Risk Assessment: Robot assisted laparoscopic radical prostatectomy and bilateral pelvic lymphadnectomy on 04/15/23 with Dr. Laverle [name].  Mr. Tamargo's perioperative risk of a major cardiac event is 11% according to the Revised Cardiac Risk Index (RCRI).  Therefore, he is at high risk for perioperative complications.   His functional capacity is good at 5.81 METs according to the [facility] Activity Status Index (DASI). Recommendations: According to  ACC/AHA guidelines, no further cardiovascular testing needed.  The patient may proceed to surgery at acceptable risk.    The patient was advised that if he develops new symptoms prior to surgery to contact our office to arrange for a follow-up visit, and he verbalized understanding.  A copy of this note will be routed to requesting surgeon.  Time:   Today, I have spent 7 minutes with the patient with telehealth technology discussing medical history, symptoms, and management plan.    Rip Harbour, NP  04/01/2023, 10:11 AM

## 2023-04-06 NOTE — Patient Instructions (Addendum)
 SURGICAL WAITING ROOM VISITATION  Patients having surgery or a procedure may have no more than 2 support people in the waiting area - these visitors may rotate.    Children under the age of 57 must have an adult with them who is not the patient.  Due to an increase in RSV and influenza rates and associated hospitalizations, children ages 2 and under may not visit patients in [facility] hospitals.  If the patient needs to stay at the hospital during part of their recovery, the visitor guidelines for inpatient rooms apply. Pre-op  nurse will coordinate an appropriate time for 1 support person to accompany patient in pre-op .  This support person may not rotate.    Please refer to the [facility] website for the visitor guidelines for Inpatients (after your surgery is over and you are in a regular room).    Your procedure is scheduled on: 04/15/23   Report to [facility] Main Entrance    Report to admitting at 9:00 AM   Call this number if you have problems the morning of surgery [phone]   Follow a clear liquid diet the day before surgery.  Water  Non-Citrus Juices (without pulp, NO RED-Apple, White grape, White cranberry) Black Coffee (NO MILK/CREAM OR CREAMERS, sugar ok)  Clear Tea (NO MILK/CREAM OR CREAMERS, sugar ok) regular and decaf                             Plain Jell-O (NO RED)                                           Fruit ices (not with fruit pulp, NO RED)                                     Popsicles (NO RED)                                                               Sports drinks like Gatorade (NO RED)              Nothing to drink after midnight          If you have questions, please contact your surgeon's office.   FOLLOW BOWEL PREP AND ANY ADDITIONAL PRE OP INSTRUCTIONS YOU RECEIVED FROM YOUR SURGEON'S OFFICE!!!     Oral Hygiene is also important to reduce your risk of infection.                                    Remember - BRUSH YOUR TEETH THE  MORNING OF SURGERY WITH YOUR REGULAR TOOTHPASTE  DENTURES WILL BE REMOVED PRIOR TO SURGERY PLEASE DO NOT APPLY Poly grip OR ADHESIVES!!!   Stop all vitamins and herbal supplements 7 days before surgery.   Take these medicines the morning of surgery with A SIP OF WATER : Atorvastatin , Carvedilol , Duloxetine , Compazine , Sertraline    DO NOT TAKE ANY ORAL DIABETIC MEDICATIONS DAY OF YOUR SURGERY  How to Manage  Your Diabetes Before and After Surgery  Why is it important to control my blood sugar before and after surgery? Improving blood sugar levels before and after surgery helps healing and can limit problems. A way of improving blood sugar control is eating a healthy diet by:  Eating less sugar and carbohydrates  Increasing activity/exercise  Talking with your doctor about reaching your blood sugar goals High blood sugars (greater than 180 mg/dL) can raise your risk of infections and slow your recovery, so you will need to focus on controlling your diabetes during the weeks before surgery. Make sure that the doctor who takes care of your diabetes knows about your planned surgery including the date and location.  How do I manage my blood sugar before surgery? Check your blood sugar at least 4 times a day, starting 2 days before surgery, to make sure that the level is not too high or low. Check your blood sugar the morning of your surgery when you wake up and every 2 hours until you get to the Short Stay unit. If your blood sugar is less than 70 mg/dL, you will need to treat for low blood sugar: Do not take insulin . Treat a low blood sugar (less than 70 mg/dL) with  cup of clear juice (cranberry or apple), 4 glucose tablets, OR glucose gel. Recheck blood sugar in 15 minutes after treatment (to make sure it is greater than 70 mg/dL). If your blood sugar is not greater than 70 mg/dL on recheck, call [phone] for further instructions. Report your blood sugar to the short stay nurse when  you get to Short Stay.  If you are admitted to the hospital after surgery: Your blood sugar will be checked by the staff and you will probably be given insulin  after surgery (instead of oral diabetes medicines) to make sure you have good blood sugar levels. The goal for blood sugar control after surgery is 80-180 mg/dL.   WHAT DO I DO ABOUT MY DIABETES MEDICATION?  Do not take oral diabetes medicines (pills) the morning of surgery.  THE NIGHT BEFORE SURGERY, take 50% of insulin  if needed.      THE MORNING OF SURGERY, do not take insulin   Reviewed and Endorsed by [facility] Patient Education Committee, August 2015                              You may not have any metal on your body including jewelry, and body piercing             Do not wear lotions, powders, cologne, or deodorant              Men may shave face and neck.   Do not bring valuables to the hospital. [facility] IS NOT             RESPONSIBLE   FOR VALUABLES.   Contacts, glasses, dentures or bridgework may not be worn into surgery.   Bring small overnight bag day of surgery.   DO NOT BRING YOUR HOME MEDICATIONS TO THE HOSPITAL. PHARMACY WILL DISPENSE MEDICATIONS LISTED ON YOUR MEDICATION LIST TO YOU DURING YOUR ADMISSION IN THE HOSPITAL!    Patients discharged on the day of surgery will not be allowed to drive home.  Someone NEEDS to stay with you for the first 24 hours after anesthesia.              Please read over the following  fact sheets you were given: IF YOU HAVE QUESTIONS ABOUT YOUR PRE-OP  INSTRUCTIONS PLEASE CALL [phone]- [place]    If you received a COVID test during your pre-op  visit  it is requested that you wear a mask when out in public, stay away from anyone that may not be feeling well and notify your surgeon if you develop symptoms. If you test positive for Covid or have been in contact with anyone that has tested positive in the last 10 days please notify you surgeon.  [facility] - Preparing  for Surgery Before surgery, you can play an important role.  Because skin is not sterile, your skin needs to be as free of germs as possible.  You can reduce the number of germs on your skin by washing with CHG (chlorahexidine gluconate) soap before surgery.  CHG is an antiseptic cleaner which kills germs and bonds with the skin to continue killing germs even after washing. Please DO NOT use if you have an allergy to CHG or antibacterial soaps.  If your skin becomes reddened/irritated stop using the CHG and inform your nurse when you arrive at Short Stay. Do not shave (including legs and underarms) for at least 48 hours prior to the first CHG shower.  You may shave your face/neck.  Please follow these instructions carefully:  1.  Shower with CHG Soap the night before surgery and the  morning of surgery.  2.  If you choose to wash your hair, wash your hair first as usual with your normal  shampoo.  3.  After you shampoo, rinse your hair and body thoroughly to remove the shampoo.                             4.  Use CHG as you would any other liquid soap.  You can apply chg directly to the skin and wash.  Gently with a scrungie or clean washcloth.  5.  Apply the CHG Soap to your body ONLY FROM THE NECK DOWN.   Do   not use on face/ open                           Wound or open sores. Avoid contact with eyes, ears mouth and   genitals (private parts).                       Wash face,  Genitals (private parts) with your normal soap.             6.  Wash thoroughly, paying special attention to the area where your    surgery  will be performed.  7.  Thoroughly rinse your body with warm water  from the neck down.  8.  DO NOT shower/wash with your normal soap after using and rinsing off the CHG Soap.                9.  Pat yourself dry with a clean towel.            10.  Wear clean pajamas.            11.  Place clean sheets on your bed the night of your first shower and do not  sleep with pets. Day of  Surgery : Do not apply any lotions/deodorants the morning of surgery.  Please wear clean clothes to the hospital/surgery center.  FAILURE TO FOLLOW THESE  INSTRUCTIONS MAY RESULT IN THE CANCELLATION OF YOUR SURGERY  PATIENT SIGNATURE_________________________________  NURSE SIGNATURE__________________________________  ________________________________________________________________________      WHAT IS A BLOOD TRANSFUSION? Blood Transfusion Information  A transfusion is the replacement of blood or some of its parts. Blood is made up of multiple cells which provide different functions. Red blood cells carry oxygen and are used for blood loss replacement. White blood cells fight against infection. Platelets control bleeding. Plasma helps clot blood. Other blood products are available for specialized needs, such as hemophilia or other clotting disorders. BEFORE THE TRANSFUSION  Who gives blood for transfusions?  Healthy volunteers who are fully evaluated to make sure their blood is safe. This is blood bank blood. Transfusion therapy is the safest it has ever been in the practice of medicine. Before blood is taken from a donor, a complete history is taken to make sure that person has no history of diseases nor engages in risky social behavior (examples are intravenous drug use or sexual activity with multiple partners). The donor's travel history is screened to minimize risk of transmitting infections, such as malaria. The donated blood is tested for signs of infectious diseases, such as HIV and hepatitis. The blood is then tested to be sure it is compatible with you in order to minimize the chance of a transfusion reaction. If you or a relative donates blood, this is often done in anticipation of surgery and is not appropriate for emergency situations. It takes many days to process the donated blood. RISKS AND COMPLICATIONS Although transfusion therapy is very safe and saves many lives, the main  dangers of transfusion include:  Getting an infectious disease. Developing a transfusion reaction. This is an allergic reaction to something in the blood you were given. Every precaution is taken to prevent this. The decision to have a blood transfusion has been considered carefully by your caregiver before blood is given. Blood is not given unless the benefits outweigh the risks. AFTER THE TRANSFUSION Right after receiving a blood transfusion, you will usually feel much better and more energetic. This is especially true if your red blood cells have gotten low (anemic). The transfusion raises the level of the red blood cells which carry oxygen, and this usually causes an energy increase. The nurse administering the transfusion will monitor you carefully for complications. HOME CARE INSTRUCTIONS  No special instructions are needed after a transfusion. You may find your energy is better. Speak with your caregiver about any limitations on activity for underlying diseases you may have. SEEK MEDICAL CARE IF:  Your condition is not improving after your transfusion. You develop redness or irritation at the intravenous (IV) site. SEEK IMMEDIATE MEDICAL CARE IF:  Any of the following symptoms occur over the next 12 hours: Shaking chills. You have a temperature by mouth above 102 F (38.9 C), not controlled by medicine. Chest, back, or muscle pain. People around you feel you are not acting correctly or are confused. Shortness of breath or difficulty breathing. Dizziness and fainting. You get a rash or develop hives. You have a decrease in urine output. Your urine turns a dark color or changes to pink, red, or brown. Any of the following symptoms occur over the next 10 days: You have a temperature by mouth above 102 F (38.9 C), not controlled by medicine. Shortness of breath. Weakness after normal activity. The white part of the eye turns yellow (jaundice). You have a decrease in the amount of  urine or are urinating less often. Your  urine turns a dark color or changes to pink, red, or brown. Document Released: 03/20/2000 Document Revised: 06/15/2011 Document Reviewed: 11/07/2007 [facility] Patient Information 2014 [place], [place].  _______________________________________________________________________

## 2023-04-06 NOTE — Progress Notes (Addendum)
 COVID Vaccine Completed: yes  Date of COVID positive in last 90 days: no  PCP - Lonni Africa, DO Cardiologist - Aleene Finely, MD LOV 11/16/22 Nephrologist- Dr. Dalene  Cardiac clearance by Rosabel Mose, NP 04/01/23 in Epic   CT- 11/02/22 Epic Chest x-ray - n/a EKG - 11/11/22 Epic Stress Test - in 2020 per pt ECHO - 12/18/21 Epic Cardiac Cath - n/a Pacemaker/ICD device last checked: n/a Spinal Cord Stimulator:  Bowel Prep - clears day before, mag citrate, and fleet enema  Sleep Study - yes, negative CPAP -   Fasting Blood Sugar -  Checks Blood Sugar  not currently checking because last A1C was 5.5  Last dose of GLP1 agonist-  N/A GLP1 instructions:  Hold 7 days before surgery    Last dose of SGLT-2 inhibitors-  N/A SGLT-2 instructions:  Hold 3 days before surgery    Blood Thinner Instructions: n/a Aspirin Instructions: Last Dose:  Activity level: Can go up a flight of stairs and perform activities of daily living without stopping and without symptoms of chest pain. SOB occasionally, not new  Anesthesia review: HTN, HFrEF, OSA, DM2, ESRD, dialysis MWF, seizures, creatinine 7.02  Patient denies shortness of breath, fever, cough and chest pain at PAT appointment  Patient verbalized understanding of instructions that were given to them at the PAT appointment. Patient was also instructed that they will need to review over the PAT instructions again at home before surgery.

## 2023-04-08 ENCOUNTER — Other Ambulatory Visit: Payer: Self-pay

## 2023-04-08 ENCOUNTER — Encounter (HOSPITAL_COMMUNITY)
Admission: RE | Admit: 2023-04-08 | Discharge: 2023-04-08 | Disposition: A | Discharge status: Home / Self Care | Payer: Medicaid Other | Source: Ambulatory Visit | Attending: Urology | Admitting: Urology

## 2023-04-08 ENCOUNTER — Encounter (HOSPITAL_COMMUNITY): Payer: Self-pay

## 2023-04-08 VITALS — BP 162/77 | HR 77 | Temp 98.0°F | Resp 16 | Ht 71.0 in

## 2023-04-08 DIAGNOSIS — I132 Hypertensive heart and chronic kidney disease with heart failure and with stage 5 chronic kidney disease, or end stage renal disease: Secondary | ICD-10-CM | POA: Diagnosis not present

## 2023-04-08 DIAGNOSIS — E1122 Type 2 diabetes mellitus with diabetic chronic kidney disease: Secondary | ICD-10-CM | POA: Diagnosis not present

## 2023-04-08 DIAGNOSIS — N186 End stage renal disease: Secondary | ICD-10-CM | POA: Insufficient documentation

## 2023-04-08 DIAGNOSIS — Z992 Dependence on renal dialysis: Secondary | ICD-10-CM | POA: Diagnosis not present

## 2023-04-08 DIAGNOSIS — I509 Heart failure, unspecified: Secondary | ICD-10-CM | POA: Diagnosis not present

## 2023-04-08 DIAGNOSIS — C61 Malignant neoplasm of prostate: Secondary | ICD-10-CM | POA: Diagnosis not present

## 2023-04-08 DIAGNOSIS — G4730 Sleep apnea, unspecified: Secondary | ICD-10-CM | POA: Insufficient documentation

## 2023-04-08 DIAGNOSIS — Z01812 Encounter for preprocedural laboratory examination: Secondary | ICD-10-CM | POA: Insufficient documentation

## 2023-04-08 DIAGNOSIS — E119 Type 2 diabetes mellitus without complications: Secondary | ICD-10-CM

## 2023-04-08 HISTORY — DX: Cardiac murmur, unspecified: R01.1

## 2023-04-08 LAB — CBC
HCT: 34 % — ABNORMAL LOW (ref 39.0–52.0)
Hemoglobin: 11.3 g/dL — ABNORMAL LOW (ref 13.0–17.0)
MCH: 31.1 pg (ref 26.0–34.0)
MCHC: 33.2 g/dL (ref 30.0–36.0)
MCV: 93.7 fL (ref 80.0–100.0)
Platelets: 179 10*3/uL (ref 150–400)
RBC: 3.63 MIL/uL — ABNORMAL LOW (ref 4.22–5.81)
RDW: 14.9 % (ref 11.5–15.5)
WBC: 6.7 10*3/uL (ref 4.0–10.5)
nRBC: 0 % (ref 0.0–0.2)

## 2023-04-08 LAB — BASIC METABOLIC PANEL
Anion gap: 14 (ref 5–15)
BUN: 54 mg/dL — ABNORMAL HIGH (ref 6–20)
CO2: 26 mmol/L (ref 22–32)
Calcium: 8.8 mg/dL — ABNORMAL LOW (ref 8.9–10.3)
Chloride: 96 mmol/L — ABNORMAL LOW (ref 98–111)
Creatinine, Ser: 7.02 mg/dL — ABNORMAL HIGH (ref 0.61–1.24)
GFR, Estimated: 9 mL/min — ABNORMAL LOW (ref >60)
Glucose, Bld: 141 mg/dL — ABNORMAL HIGH (ref 70–99)
Potassium: 4.2 mmol/L (ref 3.5–5.1)
Sodium: 136 mmol/L (ref 135–145)

## 2023-04-08 LAB — GLUCOSE, CAPILLARY: Glucose-Capillary: 139 mg/dL — ABNORMAL HIGH (ref 70–99)

## 2023-04-09 LAB — HEMOGLOBIN A1C
Hgb A1c MFr Bld: 6.5 % — ABNORMAL HIGH (ref 4.8–5.6)
Mean Plasma Glucose: 140 mg/dL

## 2023-04-09 NOTE — Anesthesia Preprocedure Evaluation (Addendum)
 Anesthesia Evaluation  Patient identified by MRN, date of birth, ID band Patient awake    Reviewed: Allergy & Precautions, NPO status , Patient's Chart, lab work & pertinent test results, reviewed documented beta blocker date and time   History of Anesthesia Complications Negative for: history of anesthetic complications  Airway Mallampati: III  TM Distance: >3 FB Neck ROM: Full    Dental  (+) Dental Advisory Given   Pulmonary sleep apnea , former smoker   Pulmonary exam normal        Cardiovascular hypertension, Pt. on home beta blockers and Pt. on medications Normal cardiovascular exam   '23 TTE - EF 60 to 65%. There is mild left ventricular hypertrophy. Grade I diastolic dysfunction (impaired relaxation). Left atrial size was mildly dilated. No significant valvular d/o.    Neuro/Psych Seizures -, Well Controlled,  PSYCHIATRIC DISORDERS Anxiety Depression     Neuromuscular disease    GI/Hepatic ,GERD  Controlled and Medicated,,(+)     substance abuse  marijuana use  Endo/Other  diabetes, Type 2, Insulin  Dependent   Obesity   Renal/GU ESRF and DialysisRenal disease     Musculoskeletal  (+) Arthritis ,    Abdominal   Peds  Hematology  (+) Blood dyscrasia, anemia   Anesthesia Other Findings   Reproductive/Obstetrics                             Anesthesia Physical Anesthesia Plan  ASA: 3  Anesthesia Plan: General   Post-op Pain Management: Tylenol  PO (pre-op )* and Dilaudid  IV   Induction: Intravenous  PONV Risk Score and Plan: 2 and Treatment may vary due to age or medical condition, Ondansetron , Dexamethasone  and Midazolam   Airway Management Planned: Oral ETT  Additional Equipment: None  Intra-op Plan:   Post-operative Plan: Extubation in OR  Informed Consent: I have reviewed the patients History and Physical, chart, labs and discussed the procedure including the risks,  benefits and alternatives for the proposed anesthesia with the patient or authorized representative who has indicated his/her understanding and acceptance.     Dental advisory given  Plan Discussed with: CRNA and Anesthesiologist  Anesthesia Plan Comments: (See PAT note 04/08/23)       Anesthesia Quick Evaluation

## 2023-04-09 NOTE — Progress Notes (Signed)
 Anesthesia Chart Review   Case: [id number] Date/Time: 04/15/23 1100   Procedure: XI ROBOTIC ASSISTED LAPAROSCOPIC RADICAL PROSTATECTOMY LEVEL 2/BILATERAL PELVIC LYMPHADENECTOMY - 210 MINUTES NEEDED FOR CASE   Anesthesia type: General   Pre-op  diagnosis: PROSTATE CANCER   Location: [facility] ROOM 03 / [facility]   Surgeons: Renda Glance, MD       DISCUSSION:58 y.o. former smoker with h/o HTN, sleep apnea, CHF, ESRD on dialysis MWF, DM II, prostate cancer scheduled for above procedure 04/15/2023 with Dr. Glance Renda.   Per cardiology preoperative evaluation 04/01/2023, Preoperative Cardiovascular Risk Assessment: Robot assisted laparoscopic radical prostatectomy and bilateral pelvic lymphadnectomy on 04/15/23 with Dr. Renda.   Mr. Kesinger's perioperative risk of a major cardiac event is 11% according to the Revised Cardiac Risk Index (RCRI).  Therefore, he is at high risk for perioperative complications.   His functional capacity is good at 5.81 METs according to the Duke Activity Status Index (DASI). Recommendations: According to ACC/AHA guidelines, no further cardiovascular testing needed.  The patient may proceed to surgery at acceptable risk.  VS: BP (!) 162/77   Pulse 77   Temp 36.7 C (Oral)   Resp 16   Ht 5' 11 (1.803 m)   SpO2 100%   BMI 25.80 kg/m   PROVIDERS: Harrie Bruckner, DO is PCP   Primary Cardiologist:  Aleene Passe, MD  LABS: Labs reviewed: Acceptable for surgery. (all labs ordered are listed, but only abnormal results are displayed)  Labs Reviewed  CBC - Abnormal; Notable for the following components:      Result Value   RBC 3.63 (*)    Hemoglobin 11.3 (*)    HCT 34.0 (*)    All other components within normal limits  BASIC METABOLIC PANEL - Abnormal; Notable for the following components:   Chloride 96 (*)    Glucose, Bld 141 (*)    BUN 54 (*)    Creatinine, Ser 7.02 (*)    Calcium  8.8 (*)    GFR, Estimated 9 (*)    All other components within normal  limits  HEMOGLOBIN A1C - Abnormal; Notable for the following components:   Hgb A1c MFr Bld 6.5 (*)    All other components within normal limits  GLUCOSE, CAPILLARY - Abnormal; Notable for the following components:   Glucose-Capillary 139 (*)    All other components within normal limits  TYPE AND SCREEN     IMAGES: VAS US  Carotid 12/02/2022 Summary:  Right Carotid: Velocities in the right ICA are consistent with a 1-39%  stenosis.   Left Carotid: Velocities in the left ICA are consistent with a 1-39%  stenosis.               Non-hemodynamically significant plaque <50% noted in the  CCA.   EKG:   CV: Echo 12/18/2021 1. Left ventricular ejection fraction, by estimation, is 60 to 65%. The  left ventricle has normal function. The left ventricle has no regional  wall motion abnormalities. There is mild left ventricular hypertrophy.  Left ventricular diastolic parameters  are consistent with Grade I diastolic dysfunction (impaired relaxation).  Elevated left ventricular end-diastolic pressure. The E/e' is 16.   2. Right ventricular systolic function is low normal. The right  ventricular size is normal. Tricuspid regurgitation signal is inadequate  for assessing PA pressure.   3. Left atrial size was mildly dilated.   4. The mitral valve is normal in structure. No evidence of mitral valve  regurgitation.   5. The aortic valve  is tricuspid. Aortic valve regurgitation is not  visualized.   6. The inferior vena cava is normal in size with greater than 50%  respiratory variability, suggesting right atrial pressure of 3 mmHg.   Past Medical History:  Diagnosis Date   Acute exacerbation of CHF (congestive heart failure) (HCC) 12/31/2021   Anxiety    Panic Atacks   Arthritis    Carpal tunnel syndrome    Chronic kidney disease    Constipation    Depression    Diabetes mellitus    DMII   Dysrhythmia    hx tachy hr   GERD (gastroesophageal reflux disease)    no problem in past  few years.   Heart disease    Heart murmur    Hepatitis    Hyperlipidemia    Hypertension    Nausea and vomiting 11/12/2022   Neuropathy associated with endocrine disorder (HCC)    Seizures (HCC)    stress related no more since taking Klonopin  2 years- See Neurologist  [facility]   Shortness of breath dyspnea    with exertion   Sleep apnea    used a cpap when he was 350lb-does not need one now 260lb   Wears contact lenses     Past Surgical History:  Procedure Laterality Date   AV FISTULA PLACEMENT Left 09/11/2022   Procedure: LEFT ARM BRACHIOCEPHALIC ARTERIOVENOUS (AV) FISTULA CREATION;  Surgeon: Sheree Penne Bruckner, MD;  Location: [facility] OR;  Service: Vascular;  Laterality: Left;   CATARACT EXTRACTION Bilateral    COLONOSCOPY     KNEE ARTHROSCOPY W/ MENISCAL REPAIR  04/06/1998   left   LUMBAR LAMINECTOMY  04/06/2000   PROSTATE BIOPSY      MEDICATIONS:  atorvastatin  (LIPITOR) 40 MG tablet   bumetanide  (BUMEX ) 2 MG tablet   carvedilol  (COREG ) 25 MG tablet   DULoxetine  (CYMBALTA ) 20 MG capsule   feeding supplement (BOOST HIGH PROTEIN) LIQD   gabapentin  (NEURONTIN ) 100 MG capsule   insulin  glargine (LANTUS ) 100 UNIT/ML Solostar Pen   Insulin  Pen Needle (PEN NEEDLES) 32G X 4 MM MISC   Methoxy PEG-Epoetin Beta (MIRCERA IJ)   prochlorperazine  (COMPAZINE ) 5 MG tablet   sertraline  (ZOLOFT ) 100 MG tablet   sevelamer carbonate (RENVELA) 800 MG tablet   No current facility-administered medications for this encounter.    Harlene Hoots Ward, PA-C [facility] [phone]

## 2023-04-14 NOTE — H&P (Signed)
 CC: Prostate Cancer   [name] is a 58 year old gentleman with multiple medical comorbidities including diastolic CHF, ESRD, obesity, and diabetes. He was found to have an elevated PSA of 16.4 and had a negative prostate MRI on 12/14/22. He underwent a TRUS biopsy of the prostate on 12/31/22 that demonstrated Gleason 4+5=9 adenocarcinoma with 4 out of 12 biopsy cores positive. He has been counseled by Dr. Devere and Dr. Patrcia (who offered LT-ADT and IMRT).   Family history: Multiple uncles.   Imaging studies:  MRI (12/14/22) - No EPE, SVI, LAD, or bone lesions.  PSMA PET scan (12/31/22) - No metastatic disease. Uptake in left side of the prostate consistent with biopsy pathology.   PMH: He has a history of ESRD, diastolic CHF (followed by Dr. Alveta), diabetes, seizure disorder, sleep apnea, anxiety, and depression. He is dialysis dependent since September and does receive dialysis Monday/Wednesday/Friday. He has a left upper extremity AV fistula. He still voids approximately 4-5 times per day.  PSH: No abdominal surgeries.   TNM stage: cT1c N0 M0  PSA: 16.4  Gleason score: 4+5=9 (GG 5)  Biopsy (12/31/22): 4/12 cores positive  Left: L lateral apex (30%, 4+5=9), L apex (30%, 4+3=7), L lateral mid (20%, 3+4=7), L mid (20%, 4+5=9)  Right: Benign  Prostate volume: 29.6 cc   Nomogram  OC disease: 16%  EPE: 80%  SVI: 28%  LNI: 28%  PFS (5 year, 10 year): 32%, 20%   Urinary function: IPSS is 18. His main symptoms are a weak stream and urinary frequency. They are moderately but not severely bothersome.  Erectile function: SHIM score is 1. He does have severe erectile dysfunction that has been refractory to treatment.     ALLERGIES: Lisinopril     MEDICATIONS: Levofloxacin 750 mg tablet 1 tablet PO As Directed Take one hour prior to your scheduled procedure.  Atorvastatin  Calcium  40 mg tablet  Bumetanide  2 mg tablet  Carvedilol  25 mg tablet  Hydralazine  Hcl 50 mg tablet 1 tablet PO TID   Sertraline  Hcl 50 mg tablet     GU PSH: Prostate Needle Biopsy - 12/31/2022       PSH Notes: knee surgery, back surgery   NON-GU PSH: Surgical Pathology, Gross And Microscopic Examination For Prostate Needle - 12/31/2022     GU PMH: Prostate Cancer - 01/07/2023 Elevated PSA - 12/31/2022, - 09/07/2022      PMH Notes: kidney failure, seizures   NON-GU PMH: Anxiety Arthritis Cardiac murmur, unspecified Depression Diabetes Type 2 GERD Heart disease, unspecified Hypercholesterolemia Hypertension Other heart failure Sleep Apnea    FAMILY HISTORY: No Family History    SOCIAL HISTORY: Marital Status: Single Preferred Language: English; Ethnicity: Not Hispanic Or Latino; Race: Black or African American Current Smoking Status: Patient does not smoke anymore.   Tobacco Use Assessment Completed: Used Tobacco in last 30 days? Has never drank.  Drinks 2 caffeinated drinks per day.    REVIEW OF SYSTEMS:    GU Review Male:   Patient denies frequent urination, hard to postpone urination, burning/ pain with urination, get up at night to urinate, leakage of urine, stream starts and stops, trouble starting your streams, and have to strain to urinate .  Gastrointestinal (Upper):   Patient denies nausea and vomiting.  Gastrointestinal (Lower):   Patient denies diarrhea and constipation.  Constitutional:   Patient denies fever, night sweats, weight loss, and fatigue.  Skin:   Patient denies skin rash/ lesion and itching.  Eyes:   Patient denies  double vision and blurred vision.  Ears/ Nose/ Throat:   Patient denies sore throat and sinus problems.  Hematologic/Lymphatic:   Patient denies swollen glands and easy bruising.  Cardiovascular:   Patient denies leg swelling and chest pains.  Respiratory:   Patient denies cough and shortness of breath.  Endocrine:   Patient denies excessive thirst.  Musculoskeletal:   Patient denies back pain and joint pain.  Neurological:   Patient denies  headaches and dizziness.  Psychologic:   Patient denies depression and anxiety.   VITAL SIGNS:     Weight 185 lb / 83.91 kg  Height 70 in / 177.8 cm  BMI 26.5 kg/m    MULTI-SYSTEM PHYSICAL EXAMINATION:    Constitutional: Well-nourished. No physical deformities. Normally developed. Good grooming.  Respiratory: No labored breathing, no use of accessory muscles. Clear bilaterally.  Cardiovascular: Normal temperature, normal extremity pulses, no swelling, no varicosities. Regular rate and rhythm.  Gastrointestinal: He has had significant weight loss resulting in redundant abdominal skin tissue.     Complexity of Data:  Lab Test Review:   PSA  Records Review:   Pathology Reports, Previous Patient Records   08/12/22 08/06/20 06/10/20  PSA  Total PSA 16.4 ng/ml 6.0 ng/ml 8.3 ng/ml    PROCEDURES: None   ASSESSMENT:      ICD-10 Details  1 GU:   Prostate Cancer - C61    PLAN:         1. High risk prostate cancer: I had a detailed discussion with [name] and his friend today regarding his prostate cancer situation. We did discuss his significant medical issues including his diastolic heart failure, dialysis dependent end-stage renal disease, etc. We did discuss that he we will likely need to have his cancer treated and remained disease-free for approximately 2 years to be considered a candidate for renal replacement therapy with a transplant.   The patient was counseled about the natural history of prostate cancer and the standard treatment options that are available for prostate cancer. It was explained to him how his age and life expectancy, clinical stage, Gleason score/prognostic grade group, and PSA (and PSA density) affect his prognosis, the decision to proceed with additional staging studies, as well as how that information influences recommended treatment strategies. We discussed the roles for active surveillance, radiation therapy, surgical therapy, androgen deprivation, as well  as ablative therapy and other investigational options for the treatment of prostate cancer as appropriate to his individual cancer situation. We discussed the risks and benefits of these options with regard to their impact on cancer control and also in terms of potential adverse events, complications, and impact on quality of life particularly related to urinary and sexual function. The patient was encouraged to ask questions throughout the discussion today and all questions were answered to his stated satisfaction. In addition, the patient was provided with and/or directed to appropriate resources and literature for further education about prostate cancer and treatment options. We discussed surgical therapy for prostate cancer including the different available surgical approaches. We discussed, in detail, the risks and expectations of surgery with regard to cancer control, urinary control, and erectile function as well as the expected postoperative recovery process. Additional risks of surgery including but not limited to bleeding, infection, hernia formation, nerve damage, lymphocele formation, bowel/rectal injury potentially necessitating colostomy, damage to the urinary tract resulting in urine leakage, urethral stricture, and the cardiopulmonary risks such as myocardial infarction, stroke, death, venothromboembolism, etc. were explained. The risk of open surgical  conversion for robotic/laparoscopic prostatectomy was also discussed.   At this time, [name] is most interested in proceeding with surgical treatment of his prostate cancer. He does have 2 uncles that died of metastatic prostate cancer. He will therefore be scheduled for a unilateral right nerve sparing robot-assisted laparoscopic radical prostatectomy and bilateral pelvic lymphadenectomy. We will obtain cardiac clearance/risk assessment from his cardiologist. We will plan to proceed with surgery on a Thursday so that he can undergo his normal  dialysis on Wednesday with plans to discharge him Friday morning so that he can resume dialysis at 1145 on Friday. If not possible, we can certainly talk with his nephrologist about scheduling dialysis on Saturday that week.

## 2023-04-15 ENCOUNTER — Ambulatory Visit (HOSPITAL_BASED_OUTPATIENT_CLINIC_OR_DEPARTMENT_OTHER): Payer: Medicaid Other | Admitting: Registered Nurse

## 2023-04-15 ENCOUNTER — Observation Stay (HOSPITAL_COMMUNITY)
Admission: RE | Admit: 2023-04-15 | Discharge: 2023-04-16 | Disposition: A | Discharge status: Home / Self Care | Payer: Medicaid Other | Attending: Urology | Admitting: Urology

## 2023-04-15 ENCOUNTER — Ambulatory Visit (HOSPITAL_COMMUNITY): Payer: Medicaid Other | Admitting: Physician Assistant

## 2023-04-15 ENCOUNTER — Encounter (HOSPITAL_COMMUNITY)
Admission: RE | Disposition: A | Discharge status: Home / Self Care | Payer: Self-pay | Source: Home / Self Care | Attending: Urology

## 2023-04-15 ENCOUNTER — Encounter (HOSPITAL_COMMUNITY): Payer: Self-pay | Admitting: Urology

## 2023-04-15 DIAGNOSIS — I120 Hypertensive chronic kidney disease with stage 5 chronic kidney disease or end stage renal disease: Secondary | ICD-10-CM | POA: Diagnosis not present

## 2023-04-15 DIAGNOSIS — Z79899 Other long term (current) drug therapy: Secondary | ICD-10-CM | POA: Insufficient documentation

## 2023-04-15 DIAGNOSIS — I5030 Unspecified diastolic (congestive) heart failure: Secondary | ICD-10-CM | POA: Diagnosis not present

## 2023-04-15 DIAGNOSIS — Z992 Dependence on renal dialysis: Secondary | ICD-10-CM | POA: Insufficient documentation

## 2023-04-15 DIAGNOSIS — E1122 Type 2 diabetes mellitus with diabetic chronic kidney disease: Secondary | ICD-10-CM

## 2023-04-15 DIAGNOSIS — Z87891 Personal history of nicotine dependence: Secondary | ICD-10-CM | POA: Insufficient documentation

## 2023-04-15 DIAGNOSIS — N186 End stage renal disease: Secondary | ICD-10-CM | POA: Diagnosis not present

## 2023-04-15 DIAGNOSIS — I132 Hypertensive heart and chronic kidney disease with heart failure and with stage 5 chronic kidney disease, or end stage renal disease: Secondary | ICD-10-CM | POA: Diagnosis not present

## 2023-04-15 DIAGNOSIS — C61 Malignant neoplasm of prostate: Secondary | ICD-10-CM | POA: Diagnosis not present

## 2023-04-15 DIAGNOSIS — E119 Type 2 diabetes mellitus without complications: Secondary | ICD-10-CM

## 2023-04-15 HISTORY — PX: ROBOT ASSISTED LAPAROSCOPIC RADICAL PROSTATECTOMY: SHX5141

## 2023-04-15 LAB — HEMOGLOBIN AND HEMATOCRIT, BLOOD
HCT: 33.4 % — ABNORMAL LOW (ref 39.0–52.0)
Hemoglobin: 11 g/dL — ABNORMAL LOW (ref 13.0–17.0)

## 2023-04-15 LAB — POTASSIUM: Potassium: 3.7 mmol/L (ref 3.5–5.1)

## 2023-04-15 LAB — GLUCOSE, CAPILLARY
Glucose-Capillary: 145 mg/dL — ABNORMAL HIGH (ref 70–99)
Glucose-Capillary: 86 mg/dL (ref 70–99)
Glucose-Capillary: 162 mg/dL — ABNORMAL HIGH (ref 70–99)

## 2023-04-15 LAB — TYPE AND SCREEN
ABO/RH(D): A POS
Antibody Screen: NEGATIVE

## 2023-04-15 SURGERY — XI ROBOTIC ASSISTED LAPAROSCOPIC RADICAL PROSTATECTOMY LEVEL 2
Anesthesia: General | Site: Abdomen

## 2023-04-15 MED ORDER — AMISULPRIDE (ANTIEMETIC) 5 MG/2ML IV SOLN
10.0000 mg | Freq: Once | INTRAVENOUS | Status: AC | PRN
  Administered 2023-04-15: 10 mg via INTRAVENOUS

## 2023-04-15 MED ORDER — LACTATED RINGERS IV SOLN: INTRAVENOUS | Status: DC

## 2023-04-15 MED ORDER — EPHEDRINE 5 MG/ML INJ
INTRAVENOUS | Status: AC
  Filled 2023-04-15: qty 5

## 2023-04-15 MED ORDER — HYOSCYAMINE SULFATE 0.125 MG SL SUBL: 0.1250 mg | SUBLINGUAL_TABLET | Freq: Four times a day (QID) | SUBLINGUAL | Status: DC | PRN

## 2023-04-15 MED ORDER — FENTANYL CITRATE (PF) 100 MCG/2ML IJ SOLN
INTRAMUSCULAR | Status: AC
  Filled 2023-04-15: qty 2

## 2023-04-15 MED ORDER — PHENYLEPHRINE HCL-NACL 20-0.9 MG/250ML-% IV SOLN
INTRAVENOUS | Status: DC | PRN
  Administered 2023-04-15: 25 ug/min via INTRAVENOUS

## 2023-04-15 MED ORDER — DOCUSATE SODIUM 100 MG PO CAPS
100.0000 mg | ORAL_CAPSULE | Freq: Two times a day (BID) | ORAL | Status: DC
  Administered 2023-04-15 – 2023-04-16 (×2): 100 mg via ORAL
  Filled 2023-04-15 (×2): qty 1

## 2023-04-15 MED ORDER — FENTANYL CITRATE PF 50 MCG/ML IJ SOSY
25.0000 ug | PREFILLED_SYRINGE | INTRAMUSCULAR | Status: DC | PRN
  Administered 2023-04-15 (×2): 50 ug via INTRAVENOUS

## 2023-04-15 MED ORDER — CEFAZOLIN SODIUM-DEXTROSE 2-4 GM/100ML-% IV SOLN
2.0000 g | INTRAVENOUS | Status: AC
  Administered 2023-04-15: 2 g via INTRAVENOUS
  Filled 2023-04-15: qty 100

## 2023-04-15 MED ORDER — ONDANSETRON HCL 4 MG/2ML IJ SOLN
INTRAMUSCULAR | Status: DC | PRN
  Administered 2023-04-15: 4 mg via INTRAVENOUS

## 2023-04-15 MED ORDER — ORAL CARE MOUTH RINSE: 15.0000 mL | Freq: Once | OROMUCOSAL | Status: AC

## 2023-04-15 MED ORDER — OXYCODONE HCL 5 MG PO TABS: 5.0000 mg | ORAL_TABLET | Freq: Once | ORAL | Status: DC | PRN

## 2023-04-15 MED ORDER — INSULIN ASPART 100 UNIT/ML IJ SOLN
INTRAMUSCULAR | Status: AC
  Filled 2023-04-15: qty 1

## 2023-04-15 MED ORDER — ZOLPIDEM TARTRATE 5 MG PO TABS
5.0000 mg | ORAL_TABLET | Freq: Every evening | ORAL | Status: DC | PRN
  Administered 2023-04-15: 5 mg via ORAL
  Filled 2023-04-15: qty 1

## 2023-04-15 MED ORDER — HYDROMORPHONE HCL 1 MG/ML IJ SOLN
0.5000 mg | INTRAMUSCULAR | Status: DC | PRN
  Administered 2023-04-15 – 2023-04-16 (×3): 0.5 mg via INTRAVENOUS
  Filled 2023-04-15 (×3): qty 0.5

## 2023-04-15 MED ORDER — CHLORHEXIDINE GLUCONATE 0.12 % MT SOLN
15.0000 mL | Freq: Once | OROMUCOSAL | Status: AC
  Administered 2023-04-15: 15 mL via OROMUCOSAL

## 2023-04-15 MED ORDER — ONDANSETRON HCL 4 MG/2ML IJ SOLN
INTRAMUSCULAR | Status: AC
  Filled 2023-04-15: qty 2

## 2023-04-15 MED ORDER — EPHEDRINE SULFATE-NACL 50-0.9 MG/10ML-% IV SOSY
PREFILLED_SYRINGE | INTRAVENOUS | Status: DC | PRN
  Administered 2023-04-15: 5 mg via INTRAVENOUS

## 2023-04-15 MED ORDER — LIDOCAINE 2% (20 MG/ML) 5 ML SYRINGE
INTRAMUSCULAR | Status: DC | PRN
  Administered 2023-04-15: 60 mg via INTRAVENOUS

## 2023-04-15 MED ORDER — BUPIVACAINE-EPINEPHRINE 0.25% -1:200000 IJ SOLN
INTRAMUSCULAR | Status: AC
  Filled 2023-04-15: qty 1

## 2023-04-15 MED ORDER — PROPOFOL 10 MG/ML IV BOLUS
INTRAVENOUS | Status: DC | PRN
  Administered 2023-04-15: 60 mg via INTRAVENOUS
  Administered 2023-04-15: 140 mg via INTRAVENOUS

## 2023-04-15 MED ORDER — INSULIN ASPART 100 UNIT/ML IJ SOLN
0.0000 [IU] | INTRAMUSCULAR | Status: DC | PRN
  Administered 2023-04-15: 2 [IU] via SUBCUTANEOUS

## 2023-04-15 MED ORDER — ROCURONIUM BROMIDE 10 MG/ML (PF) SYRINGE
PREFILLED_SYRINGE | INTRAVENOUS | Status: AC
  Filled 2023-04-15: qty 10

## 2023-04-15 MED ORDER — DEXAMETHASONE SODIUM PHOSPHATE 10 MG/ML IJ SOLN
INTRAMUSCULAR | Status: DC | PRN
  Administered 2023-04-15: 6 mg via INTRAVENOUS

## 2023-04-15 MED ORDER — SODIUM CHLORIDE 0.9 % IV SOLN: 12.5000 mg | INTRAVENOUS | Status: DC | PRN

## 2023-04-15 MED ORDER — SODIUM CHLORIDE 0.9 % IV SOLN: INTRAVENOUS | Status: DC

## 2023-04-15 MED ORDER — BUPIVACAINE-EPINEPHRINE 0.25% -1:200000 IJ SOLN
INTRAMUSCULAR | Status: DC | PRN
  Administered 2023-04-15: 9 mL

## 2023-04-15 MED ORDER — SODIUM CHLORIDE 0.9% FLUSH
3.0000 mL | Freq: Two times a day (BID) | INTRAVENOUS | Status: DC
  Administered 2023-04-15: 5 mL via INTRAVENOUS
  Administered 2023-04-16: 3 mL via INTRAVENOUS

## 2023-04-15 MED ORDER — SULFAMETHOXAZOLE-TRIMETHOPRIM 800-160 MG PO TABS: 1.0000 | ORAL_TABLET | Freq: Two times a day (BID) | ORAL | 0 refills | Status: DC

## 2023-04-15 MED ORDER — ONDANSETRON HCL 4 MG/2ML IJ SOLN
4.0000 mg | INTRAMUSCULAR | Status: DC | PRN
  Administered 2023-04-16: 4 mg via INTRAVENOUS
  Filled 2023-04-15: qty 2

## 2023-04-15 MED ORDER — TRAMADOL HCL 50 MG PO TABS: 50.0000 mg | ORAL_TABLET | Freq: Four times a day (QID) | ORAL | 0 refills | Status: DC | PRN

## 2023-04-15 MED ORDER — CIPROFLOXACIN HCL 500 MG PO TABS: 500.0000 mg | ORAL_TABLET | Freq: Two times a day (BID) | ORAL | 0 refills | Status: AC

## 2023-04-15 MED ORDER — PROPOFOL 10 MG/ML IV BOLUS
INTRAVENOUS | Status: AC
  Filled 2023-04-15: qty 20

## 2023-04-15 MED ORDER — FENTANYL CITRATE (PF) 100 MCG/2ML IJ SOLN
INTRAMUSCULAR | Status: DC | PRN
  Administered 2023-04-15: 50 ug via INTRAVENOUS
  Administered 2023-04-15: 25 ug via INTRAVENOUS
  Administered 2023-04-15: 50 ug via INTRAVENOUS
  Administered 2023-04-15 (×3): 25 ug via INTRAVENOUS

## 2023-04-15 MED ORDER — MIDAZOLAM HCL 2 MG/2ML IJ SOLN
INTRAMUSCULAR | Status: AC
  Filled 2023-04-15: qty 2

## 2023-04-15 MED ORDER — MORPHINE SULFATE (PF) 2 MG/ML IV SOLN
2.0000 mg | INTRAVENOUS | Status: DC | PRN
  Administered 2023-04-15: 2 mg via INTRAVENOUS
  Filled 2023-04-15: qty 1

## 2023-04-15 MED ORDER — DOCUSATE SODIUM 100 MG PO CAPS: 100.0000 mg | ORAL_CAPSULE | Freq: Two times a day (BID) | ORAL | Status: DC

## 2023-04-15 MED ORDER — SODIUM CHLORIDE 0.9% FLUSH: 3.0000 mL | INTRAVENOUS | Status: DC | PRN

## 2023-04-15 MED ORDER — INSULIN ASPART 100 UNIT/ML IJ SOLN: 0.0000 [IU] | INTRAMUSCULAR | Status: DC | PRN

## 2023-04-15 MED ORDER — FENTANYL CITRATE PF 50 MCG/ML IJ SOSY
PREFILLED_SYRINGE | INTRAMUSCULAR | Status: AC
  Filled 2023-04-15: qty 2

## 2023-04-15 MED ORDER — ACETAMINOPHEN 325 MG PO TABS: 650.0000 mg | ORAL_TABLET | ORAL | Status: DC | PRN

## 2023-04-15 MED ORDER — DIPHENHYDRAMINE HCL 50 MG/ML IJ SOLN
12.5000 mg | Freq: Four times a day (QID) | INTRAMUSCULAR | Status: DC | PRN
Start: 2023-04-15 — End: 2023-04-16

## 2023-04-15 MED ORDER — SODIUM CHLORIDE 0.9 % IR SOLN: 3000.0000 mL | Status: DC

## 2023-04-15 MED ORDER — DIPHENHYDRAMINE HCL 12.5 MG/5ML PO ELIX: 12.5000 mg | ORAL_SOLUTION | Freq: Four times a day (QID) | ORAL | Status: DC | PRN

## 2023-04-15 MED ORDER — ROCURONIUM BROMIDE 10 MG/ML (PF) SYRINGE
PREFILLED_SYRINGE | INTRAVENOUS | Status: DC | PRN
  Administered 2023-04-15: 50 mg via INTRAVENOUS
  Administered 2023-04-15: 20 mg via INTRAVENOUS
  Administered 2023-04-15: 10 mg via INTRAVENOUS

## 2023-04-15 MED ORDER — STERILE WATER FOR IRRIGATION IR SOLN
Status: DC | PRN
  Administered 2023-04-15: 1000 mL

## 2023-04-15 MED ORDER — ACETAMINOPHEN 500 MG PO TABS
1000.0000 mg | ORAL_TABLET | Freq: Once | ORAL | Status: AC
  Administered 2023-04-15: 1000 mg via ORAL
  Filled 2023-04-15: qty 2

## 2023-04-15 MED ORDER — TRIPLE ANTIBIOTIC 3.5-400-5000 EX OINT: 1.0000 | TOPICAL_OINTMENT | Freq: Three times a day (TID) | CUTANEOUS | Status: DC | PRN

## 2023-04-15 MED ORDER — AMISULPRIDE (ANTIEMETIC) 5 MG/2ML IV SOLN
INTRAVENOUS | Status: AC
  Filled 2023-04-15: qty 20

## 2023-04-15 MED ORDER — CEFAZOLIN SODIUM-DEXTROSE 1-4 GM/50ML-% IV SOLN
1.0000 g | Freq: Three times a day (TID) | INTRAVENOUS | Status: AC
  Administered 2023-04-15 – 2023-04-16 (×2): 1 g via INTRAVENOUS
  Filled 2023-04-15 (×4): qty 50

## 2023-04-15 MED ORDER — DEXAMETHASONE SODIUM PHOSPHATE 10 MG/ML IJ SOLN
INTRAMUSCULAR | Status: AC
  Filled 2023-04-15: qty 1

## 2023-04-15 MED ORDER — MIDAZOLAM HCL 5 MG/5ML IJ SOLN
INTRAMUSCULAR | Status: DC | PRN
  Administered 2023-04-15: 1 mg via INTRAVENOUS

## 2023-04-15 MED ORDER — OXYCODONE HCL 5 MG/5ML PO SOLN: 5.0000 mg | Freq: Once | ORAL | Status: DC | PRN

## 2023-04-15 MED ORDER — SUGAMMADEX SODIUM 200 MG/2ML IV SOLN
INTRAVENOUS | Status: DC | PRN
  Administered 2023-04-15: 200 mg via INTRAVENOUS

## 2023-04-15 MED ORDER — LIDOCAINE HCL (PF) 2 % IJ SOLN
INTRAMUSCULAR | Status: AC
  Filled 2023-04-15: qty 5

## 2023-04-15 SURGICAL SUPPLY — 58 items
APPLICATOR COTTON TIP 6 STRL (MISCELLANEOUS) ×1 IMPLANT
APPLICATOR COTTON TIP 6IN STRL (MISCELLANEOUS) ×1 IMPLANT
BAG COUNTER SPONGE SURGICOUNT (BAG) IMPLANT
CATH FOLEY 2WAY SLVR 18FR 30CC (CATHETERS) ×1 IMPLANT
CATH ROBINSON RED A/P 16FR (CATHETERS) ×1 IMPLANT
CATH ROBINSON RED A/P 8FR (CATHETERS) ×1 IMPLANT
CATH TIEMANN FOLEY 18FR 5CC (CATHETERS) ×1 IMPLANT
CHLORAPREP W/TINT 26 (MISCELLANEOUS) ×1 IMPLANT
CLIP LIGATING HEM O LOK PURPLE (MISCELLANEOUS) ×1 IMPLANT
COVER SURGICAL LIGHT HANDLE (MISCELLANEOUS) ×1 IMPLANT
COVER TIP SHEARS 8 DVNC (MISCELLANEOUS) ×1 IMPLANT
CUTTER ECHEON FLEX ENDO 45 340 (ENDOMECHANICALS) ×1 IMPLANT
DERMABOND ADVANCED .7 DNX12 (GAUZE/BANDAGES/DRESSINGS) ×1 IMPLANT
DRAIN CHANNEL RND F F (WOUND CARE) IMPLANT
DRAPE ARM DVNC X/XI (DISPOSABLE) ×4 IMPLANT
DRAPE COLUMN DVNC XI (DISPOSABLE) ×1 IMPLANT
DRAPE SURG IRRIG POUCH 19X23 (DRAPES) ×1 IMPLANT
DRIVER NDL LRG 8 DVNC XI (INSTRUMENTS) ×2 IMPLANT
DRIVER NDLE LRG 8 DVNC XI (INSTRUMENTS) ×2 IMPLANT
DRSG TEGADERM 4X4.75 (GAUZE/BANDAGES/DRESSINGS) ×1 IMPLANT
ELECT PENCIL ROCKER SW 15FT (MISCELLANEOUS) ×1 IMPLANT
ELECT REM PT RETURN 15FT ADLT (MISCELLANEOUS) ×1 IMPLANT
FORCEPS BPLR LNG DVNC XI (INSTRUMENTS) ×1 IMPLANT
FORCEPS PROGRASP DVNC XI (FORCEP) ×1 IMPLANT
GAUZE SPONGE 4X4 12PLY STRL (GAUZE/BANDAGES/DRESSINGS) ×1 IMPLANT
GLOVE BIO SURGEON STRL SZ 6.5 (GLOVE) ×1 IMPLANT
GLOVE SURG LX STRL 7.5 STRW (GLOVE) ×2 IMPLANT
GOWN STRL REUS W/ TWL XL LVL3 (GOWN DISPOSABLE) ×2 IMPLANT
GOWN STRL SURGICAL XL XLNG (GOWN DISPOSABLE) ×1 IMPLANT
HOLDER FOLEY CATH W/STRAP (MISCELLANEOUS) ×1 IMPLANT
IRRIG SUCT STRYKERFLOW 2 WTIP (MISCELLANEOUS) ×1 IMPLANT
IRRIGATION SUCT STRKRFLW 2 WTP (MISCELLANEOUS) ×1 IMPLANT
IV LACTATED RINGERS 1000ML (IV SOLUTION) ×1 IMPLANT
KIT TURNOVER KIT A (KITS) IMPLANT
NDL SAFETY ECLIPSE 18X1.5 (NEEDLE) IMPLANT
PACK ROBOT UROLOGY CUSTOM (CUSTOM PROCEDURE TRAY) ×1 IMPLANT
PLUG CATH AND CAP STRL 200 (CATHETERS) ×1 IMPLANT
RELOAD STAPLE 45 4.1 GRN THCK (STAPLE) ×1 IMPLANT
SCISSORS MNPLR CVD DVNC XI (INSTRUMENTS) ×1 IMPLANT
SEAL UNIV 5-12 XI (MISCELLANEOUS) ×4 IMPLANT
SET CYSTO W/LG BORE CLAMP LF (SET/KITS/TRAYS/PACK) IMPLANT
SET TUBE SMOKE EVAC HIGH FLOW (TUBING) ×1 IMPLANT
SOL ELECTROSURG ANTI STICK (MISCELLANEOUS) ×1 IMPLANT
SOL PREP POV-IOD 4OZ 10% (MISCELLANEOUS) ×1 IMPLANT
SOLUTION ELECTROSURG ANTI STCK (MISCELLANEOUS) ×1 IMPLANT
SPIKE FLUID TRANSFER (MISCELLANEOUS) ×1 IMPLANT
STAPLE RELOAD 45 GRN (STAPLE) ×1 IMPLANT
SUT ETHILON 3 0 PS 1 (SUTURE) ×1 IMPLANT
SUT MNCRL 3 0 RB1 (SUTURE) ×1 IMPLANT
SUT MNCRL 3 0 VIOLET RB1 (SUTURE) ×1 IMPLANT
SUT MNCRL AB 4-0 PS2 18 (SUTURE) ×2 IMPLANT
SUT PDS PLUS AB 0 CT-2 (SUTURE) ×2 IMPLANT
SUT VIC AB 0 CT1 27XBRD ANTBC (SUTURE) ×2 IMPLANT
SUT VIC AB 2-0 SH 27X BRD (SUTURE) ×1 IMPLANT
SUT VIC AB 3-0 SH 27X BRD (SUTURE) IMPLANT
SYR 27GX1/2 1ML LL SAFETY (SYRINGE) ×1 IMPLANT
TROCAR Z THREAD OPTICAL 12X100 (TROCAR) IMPLANT
WATER STERILE IRR 1000ML POUR (IV SOLUTION) ×1 IMPLANT

## 2023-04-15 NOTE — Transfer of Care (Signed)
 Immediate Anesthesia Transfer of Care Note  Patient: [name]  Procedure(s) Performed: XI ROBOTIC ASSISTED LAPAROSCOPIC RADICAL PROSTATECTOMY LEVEL 2/BILATERAL PELVIC LYMPHADENECTOMY (Abdomen)  Patient Location: PACU  Anesthesia Type:General  Level of Consciousness: awake, alert , oriented, and patient cooperative  Airway & Oxygen Therapy: Patient Spontanous Breathing and Patient connected to face mask oxygen  Post-op Assessment: Report given to RN, Post -op Vital signs reviewed and stable, and Patient moving all extremities  Post vital signs: Reviewed and stable  Last Vitals:  Vitals Value Taken Time  BP 164/76 04/15/23 1515  Temp 36.7 C 04/15/23 1511  Pulse 74 04/15/23 1515  Resp 22 04/15/23 1517  SpO2 98 % 04/15/23 1515  Vitals shown include unfiled device data.  Last Pain:  Vitals:   04/15/23 0914  TempSrc:   PainSc: 0-No pain         Complications: No notable events documented.

## 2023-04-15 NOTE — Op Note (Signed)
 Preoperative diagnosis: Clinically localized adenocarcinoma of the prostate (clinical stage T1c N0 M0)  Postoperative diagnosis: Clinically localized adenocarcinoma of the prostate (clinical stage T1c N0 M0)  Procedure:  Robotic assisted laparoscopic radical prostatectomy (right nerve sparing) Bilateral robotic assisted laparoscopic pelvic lymphadenectomy  Surgeon: [name]. M.D.  Assistant(s): Alan Hammonds, PA-C  An assistant was required for this surgical procedure.  The duties of the assistant included but were not limited to suctioning, passing suture, camera manipulation, retraction. This procedure would not be able to be performed without an geophysicist/field seismologist.   Resident: Dr. Martina Abrahams  Anesthesia: General  Complications: None  EBL: 200 mL  IVF:  500 mL crystalloid  Specimens: Prostate and seminal vesicles Right pelvic lymph nodes Left pelvic lymph nodes  Disposition of specimens: Pathology  Drains: 20 Fr coude catheter # 19 Blake pelvic drain  Indication: [name] is a 58 y.o. patient with clinically localized prostate cancer.  After a thorough review of the management options for treatment of prostate cancer, he elected to proceed with surgical therapy and the above procedure(s).  We have discussed the potential benefits and risks of the procedure, side effects of the proposed treatment, the likelihood of the patient achieving the goals of the procedure, and any potential problems that might occur during the procedure or recuperation. Informed consent has been obtained.  Description of procedure:  The patient was taken to the operating room and a general anesthetic was administered. He was given preoperative antibiotics, placed in the dorsal lithotomy position, and prepped and draped in the usual sterile fashion. Next a preoperative timeout was performed. A urethral catheter was placed into the bladder and a site was selected near the umbilicus  for placement of the camera port. This was placed using a standard open Hassan technique which allowed entry into the peritoneal cavity under direct vision and without difficulty. An 8 mm port was placed and a pneumoperitoneum established. The camera was then used to inspect the abdomen and there was no evidence of any intra-abdominal injuries or other abnormalities. The remaining abdominal ports were then placed. 8 mm robotic ports were placed in the right lower quadrant, left lower quadrant, and far left lateral abdominal wall. A 5 mm port was placed in the right upper quadrant and a 12 mm port was placed in the right lateral abdominal wall for laparoscopic assistance. All ports were placed under direct vision without difficulty. The surgical cart was then docked.   Utilizing the cautery scissors, the bladder was reflected posteriorly allowing entry into the space of Retzius and identification of the endopelvic fascia and prostate. The periprostatic fat was then removed from the prostate allowing full exposure of the endopelvic fascia. The endopelvic fascia was then incised from the apex back to the base of the prostate bilaterally and the underlying levator muscle fibers were swept laterally off the prostate thereby isolating the dorsal venous complex. The dorsal vein was then stapled and divided with a 45 mm Flex Echelon stapler. Attention then turned to the bladder neck which was divided anteriorly thereby allowing entry into the bladder and exposure of the urethral catheter. The catheter balloon was deflated and the catheter was brought into the operative field and used to retract the prostate anteriorly. The posterior bladder neck was then examined and was divided allowing further dissection between the bladder and prostate posteriorly until the vasa deferentia and seminal vessels were identified. The vasa deferentia were isolated, divided, and lifted anteriorly. The seminal vesicles were  dissected down to  their tips with care to control the seminal vascular arterial blood supply. These structures were then lifted anteriorly and the space between Denonvillier's fascia and the anterior rectum was developed with a combination of sharp and blunt dissection. This isolated the vascular pedicles of the prostate.  The lateral prostatic fascia on the right side of the prostate was then sharply incised allowing release of the neurovascular bundle. The vascular pedicle of the prostate on the right side was then ligated with Weck clips between the prostate and neurovascular bundle and divided with sharp cold scissor dissection resulting in neurovascular bundle preservation. On the left side, a wide non nerve sparing dissection was performed with Weck clips used to ligate the vascular pedicle of the prostate. The neurovascular bundle on the right side was then separated off the apex of the prostate and urethra.  The urethra was then sharply transected allowing the prostate specimen to be disarticulated. The pelvis was copiously irrigated and hemostasis was ensured. There was no evidence for rectal injury.  Attention then turned to the right pelvic sidewall. The fibrofatty tissue between the external iliac vein, confluence of the iliac vessels, hypogastric artery, and Cooper's ligament was dissected free from the pelvic sidewall with care to preserve the obturator nerve. Weck clips were used for lymphostasis and hemostasis. An identical procedure was performed on the contralateral side and the lymphatic packets were removed for permanent pathologic analysis.  Attention then turned to the urethral anastomosis. A 2-0 Vicryl slip knot was placed between Denonvillier's fascia, the posterior bladder neck, and the posterior urethra to reapproximate these structures. A double-armed 3-0 Monocryl suture was then used to perform a 360 running tension-free anastomosis between the bladder neck and urethra. A new urethral catheter was  then placed into the bladder and irrigated. There were no blood clots within the bladder and the anastomosis appeared to be watertight. A #19 Blake drain was then brought through the left lateral 8 mm port site and positioned appropriately within the pelvis. It was secured to the skin with a nylon suture. The surgical cart was then undocked. The right lateral 12 mm port site was closed at the fascial level with a 0 Vicryl suture placed laparoscopically. All remaining ports were then removed under direct vision. The prostate specimen was removed intact within the Endopouch retrieval bag via the periumbilical camera port site. This fascial opening was closed with two running 0 Vicryl sutures. 0.25% Marcaine  was then injected into all port sites and all incisions were reapproximated at the skin level with 4-0 Monocryl subcuticular sutures. Dermabond was applied. The patient appeared to tolerate the procedure well and without complications. The patient was able to be extubated and transferred to the recovery unit in satisfactory condition.   Joseph CANDIE Renda Teddie MD

## 2023-04-15 NOTE — Discharge Summary (Signed)
 Date of admission: 04/15/2023  Date of discharge: 04/16/2023  Admission diagnosis: Prostate cancer  Discharge diagnosis: same   Secondary diagnoses:  Patient Active Problem List   Diagnosis Date Noted   Prostate cancer (HCC) 04/15/2023   Malignant neoplasm of prostate (HCC) 02/08/2023   Disc degeneration, lumbar 12/24/2022   Nausea and vomiting 11/12/2022   Liver dysfunction 02/03/2022   OSA (obstructive sleep apnea) 01/01/2022   ESRD (end stage renal disease) on dialysis (HCC) 12/31/2021   (HFpEF) heart failure with preserved ejection fraction (HCC) 12/23/2021   Constipation 10/10/2021   Unintentional weight loss 09/28/2021   Charcot foot due to diabetes mellitus (HCC) 09/05/2020   Depression    Diabetic neuropathy (HCC) 09/19/2019   Hyperlipidemia 05/10/2019   Chronic hepatitis C (HCC) 05/10/2019   Abnormal CT scan of lung 05/10/2019   Type 2 diabetes mellitus (HCC) 01/29/2016   Essential hypertension 01/29/2016   Lumbar facet arthropathy 08/29/2014    Procedures performed: Procedure(s): XI ROBOTIC ASSISTED LAPAROSCOPIC RADICAL PROSTATECTOMY LEVEL 2/BILATERAL PELVIC LYMPHADENECTOMY  History and Physical: For full details, please see admission history and physical. Briefly, [name] [name] is a 58 y.o. year old patient with 4+5 gleason grade prostate cancer who underwent a radical prostatectomy with bilateral lymph node dissection.   Hospital Course: Patient tolerated the procedure well.  He was then transferred to the floor after an uneventful PACU stay.  His hospital course was uncomplicated.  On POD#1 he had met discharge criteria: was eating a regular diet, was up and ambulating independently,  pain was well controlled, catheter remained in place and was ready to for discharge.  Patient to undergo dialysis today.    Laboratory values:  Recent Labs    04/15/23 1530 04/16/23 0530  HGB 11.0* 9.3*  HCT 33.4* 27.9*   Recent Labs    04/15/23 1022 04/16/23 0530  NA   --  133*  K 3.7 4.2  CL  --  91*  CO2  --  27  GLUCOSE  --  183*  BUN  --  40*  CREATININE  --  6.55*  CALCIUM   --  8.4*   No results for input(s): LABPT, INR in the last 72 hours. No results for input(s): LABURIN in the last 72 hours. Results for orders placed or performed in visit on 11/07/21  Microscopic Examination     Status: Abnormal   Collection Time: 11/07/21 11:20 AM   Urine  Result Value Ref Range Status   WBC, UA 0-5 0 - 5 /hpf Final   RBC, Urine 3-10 (A) 0 - 2 /hpf Final   Epithelial Cells (non renal) 0-10 0 - 10 /hpf Final   Casts None seen None seen /lpf Final   Bacteria, UA None seen None seen/Few Final    Physical Exam  Gen: NAD Resp: Satting well on RA Card: Regular rate Abd: Soft, appropriately tender, ND, incision clean dry and intact GU: foley clear on CBI.  Neuro: Alert   Disposition: Home  Discharge instruction: The patient was instructed to be ambulatory but told to refrain from heavy lifting, strenuous activity, or driving.   Discharge medications:  Allergies as of 04/16/2023       Reactions   Norvasc  [amlodipine ] Other (See Comments)   Bilateral leg swelling   Zestril  [lisinopril ] Swelling, Other (See Comments)   Face and legs swell        Medication List     TAKE these medications    atorvastatin  40 MG tablet Commonly known as:  LIPITOR Take 1 tablet (40 mg total) by mouth daily.   bumetanide  2 MG tablet Commonly known as: Bumex  Take 2 tablets (4 mg total) by mouth 2 (two) times daily. What changed: when to take this   carvedilol  25 MG tablet Commonly known as: COREG  Take 25 mg by mouth 2 (two) times daily with a meal.   ciprofloxacin  500 MG tablet Commonly known as: Cipro  Take 1 tablet (500 mg total) by mouth 2 (two) times daily for 1 dose. Take one pill after dialysis on Weds 04/21/23.   docusate sodium  100 MG capsule Commonly known as: COLACE Take 1 capsule (100 mg total) by mouth 2 (two) times daily.    DULoxetine  20 MG capsule Commonly known as: CYMBALTA  Take 1 capsule (20 mg total) by mouth daily.   feeding supplement Liqd Take 1 Container by mouth daily as needed (Supplement).   gabapentin  100 MG capsule Commonly known as: NEURONTIN  Take 1 capsule (100 mg total) by mouth at bedtime.   insulin  glargine 100 UNIT/ML Solostar Pen Commonly known as: LANTUS  Inject 5 Units into the skin daily. What changed:  how much to take when to take this reasons to take this   MIRCERA IJ Mircera   Pen Needles 32G X 4 MM Misc 1 Needle by Does not apply route daily at 6 (six) AM.   prochlorperazine  5 MG tablet Commonly known as: COMPAZINE  Take 5 mg by mouth as needed for nausea or vomiting.   sertraline  100 MG tablet Commonly known as: Zoloft  Take 1 tablet (100 mg total) by mouth daily.   sevelamer carbonate 800 MG tablet Commonly known as: RENVELA Take 800 mg by mouth 3 (three) times daily with meals.   traMADol  50 MG tablet Commonly known as: Ultram  Take 1-2 tablets (50-100 mg total) by mouth every 6 (six) hours as needed for moderate pain (pain score 4-6) or severe pain (pain score 7-10).        Followup:   Follow-up Information     Renda Glance, MD Follow up on 04/22/2023.   Specialty: Urology Why: at 3:00 Contact information: [address] [place] [address] [phone]

## 2023-04-15 NOTE — Discharge Instructions (Signed)

## 2023-04-15 NOTE — Anesthesia Procedure Notes (Signed)
 Procedure Name: Intubation Date/Time: 04/15/2023 12:01 PM  Performed by: Memory Armida LABOR, CRNAPre-anesthesia Checklist: Patient identified, Suction available, Emergency Drugs available, Patient being monitored and Timeout performed Patient Re-evaluated:Patient Re-evaluated prior to induction Oxygen Delivery Method: Circle system utilized Preoxygenation: Pre-oxygenation with 100% oxygen Induction Type: IV induction Ventilation: Mask ventilation without difficulty Laryngoscope Size: Mac and 3 Grade View: Grade II Tube type: Oral Tube size: 7.5 mm Number of attempts: 1 Airway Equipment and Method: Stylet Placement Confirmation: ETT inserted through vocal cords under direct vision, positive ETCO2 and breath sounds checked- equal and bilateral Secured at: 22 cm Tube secured with: Tape Dental Injury: Teeth and Oropharynx as per pre-operative assessment  Difficulty Due To: Difficult Airway- due to anterior larynx Comments: Smooth IV induction. Easy mask. DL X 1 with Mac 3 by Rocky KET. Unable to pass ETT. Masked. Sats 100% VSS. DR X 1 with Mac 3 by Dr Lucious. Grade 2 view. Ett secured at 22 cm at the lip

## 2023-04-15 NOTE — Progress Notes (Signed)
 Patient ID: [name], male   DOB: 1965/07/23, 58 y.o.   MRN: [id number]  Post-op note  Subjective: The patient is doing well.  No complaints.  Objective: Vital signs in last 24 hours: Temp:  [98 F (36.7 C)-98.3 F (36.8 C)] 98 F (36.7 C) (01/09 1511) Pulse Rate:  [74-80] 80 (01/09 1545) Resp:  [10-21] 15 (01/09 1545) BP: (151-174)/(70-91) 159/88 (01/09 1545) SpO2:  [96 %-100 %] 100 % (01/09 1545) Weight:  [83.6 kg] 83.6 kg (01/09 0914)  Intake/Output from previous day: No intake/output data recorded. Intake/Output this shift: Total I/O In: 692.3 [I.V.:592.3; IV Piggyback:100] Out: 200 [Blood:200]  Physical Exam:  General: Alert and oriented. Abdomen: Soft, Nondistended. Incisions: Clean and dry.  Lab Results: Recent Labs    04/15/23 1530  HGB 11.0*  HCT 33.4*    Assessment/Plan: POD#0   1) Continue to monitor, ambulate, IS, Monitor electrolytes.  SL IVF   [name]. MD   LOS: 0 days   [name] 04/15/2023, 3:57 PM

## 2023-04-16 ENCOUNTER — Other Ambulatory Visit: Payer: Self-pay

## 2023-04-16 ENCOUNTER — Encounter (HOSPITAL_COMMUNITY): Payer: Self-pay | Admitting: Urology

## 2023-04-16 DIAGNOSIS — C61 Malignant neoplasm of prostate: Secondary | ICD-10-CM | POA: Diagnosis not present

## 2023-04-16 LAB — BASIC METABOLIC PANEL
Anion gap: 15 (ref 5–15)
BUN: 40 mg/dL — ABNORMAL HIGH (ref 6–20)
CO2: 27 mmol/L (ref 22–32)
Calcium: 8.4 mg/dL — ABNORMAL LOW (ref 8.9–10.3)
Chloride: 91 mmol/L — ABNORMAL LOW (ref 98–111)
Creatinine, Ser: 6.55 mg/dL — ABNORMAL HIGH (ref 0.61–1.24)
GFR, Estimated: 9 mL/min — ABNORMAL LOW (ref >60)
Glucose, Bld: 183 mg/dL — ABNORMAL HIGH (ref 70–99)
Potassium: 4.2 mmol/L (ref 3.5–5.1)
Sodium: 133 mmol/L — ABNORMAL LOW (ref 135–145)

## 2023-04-16 LAB — HEMOGLOBIN AND HEMATOCRIT, BLOOD
HCT: 27.9 % — ABNORMAL LOW (ref 39.0–52.0)
Hemoglobin: 9.3 g/dL — ABNORMAL LOW (ref 13.0–17.0)

## 2023-04-16 MED ORDER — TRAMADOL HCL 50 MG PO TABS
50.0000 mg | ORAL_TABLET | Freq: Two times a day (BID) | ORAL | Status: DC | PRN
Start: 2023-04-16 — End: 2023-04-16
  Administered 2023-04-16: 100 mg via ORAL
  Filled 2023-04-16: qty 2

## 2023-04-16 MED ORDER — BISACODYL 10 MG RE SUPP
10.0000 mg | Freq: Once | RECTAL | Status: AC
  Administered 2023-04-16: 10 mg via RECTAL
  Filled 2023-04-16: qty 1

## 2023-04-16 NOTE — TOC Transition Note (Signed)
 Transition of Care [facility]) - Discharge Note   Patient Details  Name: [name] MRN: [id number] Date of Birth: 18-Feb-1966  [facility]) CM/SW Contact:  Bascom Service, RN Phone Number: 04/16/2023, 10:26 AM   Clinical Narrative: d/c home no needs or orders.      Final next level of care: Home/Self Care Barriers to Discharge: No Barriers Identified   Patient Goals and CMS Choice            Discharge Placement                       Discharge Plan and Services Additional resources added to the After Visit Summary for                                       Social Drivers of Health (SDOH) Interventions SDOH Screenings   Food Insecurity: No Food Insecurity (04/15/2023)  Housing: Low Risk  (04/15/2023)  Transportation Needs: No Transportation Needs (04/15/2023)  Utilities: Not At Risk (04/15/2023)  Alcohol Screen: Low Risk  (02/09/2023)  Depression (PHQ2-9): Low Risk  (02/09/2023)  Recent Concern: Depression (PHQ2-9) - High Risk (12/24/2022)  Social Connections: Moderately Integrated (02/17/2022)  Tobacco Use: Medium Risk (04/15/2023)     Readmission Risk Interventions     No data to display

## 2023-04-16 NOTE — Progress Notes (Signed)
 Patient ID: [name], male   DOB: March 05, 1966, 58 y.o.   MRN: [id number]  1 Day Post-Op Subjective: The patient is doing well.  No nausea or vomiting. Pain is adequately controlled.  Objective: Vital signs in last 24 hours: Temp:  [97.7 F (36.5 C)-98.7 F (37.1 C)] 98.5 F (36.9 C) (01/10 0648) Pulse Rate:  [74-90] 77 (01/10 0648) Resp:  [10-21] 18 (01/10 0648) BP: (128-181)/(70-92) 134/73 (01/10 0648) SpO2:  [96 %-100 %] 100 % (01/10 0648) Weight:  [83.6 kg] 83.6 kg (01/09 0914)  Intake/Output from previous day: 01/09 0701 - 01/10 0700 In: 692.3 [I.V.:592.3; IV Piggyback:100] Out: 970 [Urine:670; Drains:100; Blood:200] Intake/Output this shift: No intake/output data recorded.  Physical Exam:  General: Alert and oriented. CV: RRR Lungs: Clear bilaterally. GI: Soft, Nondistended. Incisions: Clean, dry, and intact Urine: Clear Extremities: Nontender, no erythema, no edema.  Lab Results: Recent Labs    04/15/23 1530 04/16/23 0530  HGB 11.0* 9.3*  HCT 33.4* 27.9*      Latest Ref Rng & Units 04/16/2023    5:30 AM 04/15/2023   10:22 AM 04/08/2023   11:15 AM  BMP  Glucose 70 - 99 mg/dL 816   858   BUN 6 - 20 mg/dL 40   54   Creatinine 9.38 - 1.24 mg/dL 3.44   2.97   Sodium 864 - 145 mmol/L 133   136   Potassium 3.5 - 5.1 mmol/L 4.2  3.7  4.2   Chloride 98 - 111 mmol/L 91   96   CO2 22 - 32 mmol/L 27   26   Calcium  8.9 - 10.3 mg/dL 8.4   8.8        Assessment/Plan: POD# 1 s/p robotic prostatectomy.  1) D/C IV 2) Ambulate, Incentive spirometry 3) Transition to oral pain medication 4) Dulcolax suppository 5) D/C pelvic drain 6) Plan for likely discharge later today for dialysis at 11 AM   [name]. MD   LOS: 0 days   [name] 04/16/2023, 7:20 AM

## 2023-04-16 NOTE — Anesthesia Postprocedure Evaluation (Signed)
 Anesthesia Post Note  Patient: [name]  Procedure(s) Performed: XI ROBOTIC ASSISTED LAPAROSCOPIC RADICAL PROSTATECTOMY LEVEL 2/BILATERAL PELVIC LYMPHADENECTOMY (Abdomen)     Patient location during evaluation: PACU Anesthesia Type: General Level of consciousness: awake and alert Pain management: pain level controlled Vital Signs Assessment: post-procedure vital signs reviewed and stable Respiratory status: spontaneous breathing, nonlabored ventilation, respiratory function stable and patient connected to nasal cannula oxygen Cardiovascular status: blood pressure returned to baseline and stable Postop Assessment: no apparent nausea or vomiting Anesthetic complications: no   No notable events documented.  Last Vitals:  Vitals:   04/16/23 0225 04/16/23 0648  BP: 128/70 134/73  Pulse: 77 77  Resp: 16 18  Temp: 37.1 C 36.9 C  SpO2: 100% 100%    Last Pain:  Vitals:   04/16/23 0746  TempSrc:   PainSc: 0-No pain   Pain Goal: Patients Stated Pain Goal: 2 (04/15/23 1755)                 Debby BRAVO Like

## 2023-04-16 NOTE — Plan of Care (Signed)
  Problem: Pain Management: Goal: General experience of comfort will improve Outcome: Progressing   Problem: Activity: Goal: Risk for activity intolerance will decrease Outcome: Progressing   Problem: Safety: Goal: Ability to remain free from injury will improve Outcome: Progressing   Problem: Skin Integrity: Goal: Risk for impaired skin integrity will decrease Outcome: Progressing

## 2023-04-21 LAB — SURGICAL PATHOLOGY

## 2023-04-23 ENCOUNTER — Other Ambulatory Visit: Payer: Self-pay

## 2023-04-23 DIAGNOSIS — N186 End stage renal disease: Secondary | ICD-10-CM

## 2023-05-05 ENCOUNTER — Encounter: Payer: Self-pay | Admitting: Vascular Surgery

## 2023-05-05 ENCOUNTER — Ambulatory Visit (INDEPENDENT_AMBULATORY_CARE_PROVIDER_SITE_OTHER): Payer: Medicare Other | Admitting: Vascular Surgery

## 2023-05-05 ENCOUNTER — Ambulatory Visit (HOSPITAL_COMMUNITY)
Admission: RE | Admit: 2023-05-05 | Discharge: 2023-05-05 | Disposition: A | Discharge status: Home / Self Care | Payer: Medicare Other | Source: Ambulatory Visit | Attending: Vascular Surgery | Admitting: Vascular Surgery

## 2023-05-05 VITALS — BP 114/72 | HR 61 | Temp 98.1°F | Resp 20 | Ht 71.0 in | Wt 192.0 lb

## 2023-05-05 DIAGNOSIS — N186 End stage renal disease: Secondary | ICD-10-CM

## 2023-05-05 NOTE — Progress Notes (Signed)
Patient ID: [name], male   DOB: 01/15/1966, 58 y.o.   MRN: [id number]  Reason for Consult: Follow-up   Referred by Estanislado Emms, MD  Subjective:     HPI:  [name] is a 58 y.o. male with end-stage renal disease on dialysis via left arm AV fistula.  He has not had any issues with dialyzing but has had issues with numbness in his hand when he is on dialysis after an hour or 2.  He continues to work his hand.  He does not have any tissue loss or ulceration.  He has never had any upper extremity procedures other than his fistula.  Past Medical History:  Diagnosis Date   Acute exacerbation of CHF (congestive heart failure) (HCC) 12/31/2021   Anxiety    Panic Atacks   Arthritis    Carpal tunnel syndrome    Chronic kidney disease    Constipation    Depression    Diabetes mellitus    DMII   Dysrhythmia    hx tachy hr   GERD (gastroesophageal reflux disease)    no problem in past few years.   Heart disease    Heart murmur    Hepatitis    Hyperlipidemia    Hypertension    Nausea and vomiting 11/12/2022   Neuropathy associated with endocrine disorder (HCC)    Seizures (HCC)    "stress related" no more since taking Klonopin 2 years- See Neurologist  [facility]   Shortness of breath dyspnea    with exertion   Sleep apnea    used a cpap when he was 350lb-does not need one now 260lb   Wears contact lenses    Family History  Problem Relation Age of Onset   Cancer Mother        pancres   Diabetes Mother    Hypertension Brother    Renal cancer Brother    Cancer Maternal Uncle    Colon cancer Maternal Uncle    Colon cancer Maternal Uncle    Stomach cancer Maternal Uncle    Esophageal cancer Neg Hx    Rectal cancer Neg Hx    Past Surgical History:  Procedure Laterality Date   AV FISTULA PLACEMENT Left 09/11/2022   Procedure: LEFT ARM BRACHIOCEPHALIC ARTERIOVENOUS (AV) FISTULA CREATION;  Surgeon: Maeola Harman, MD;  Location: [facility] OR;  Service:  Vascular;  Laterality: Left;   CATARACT EXTRACTION Bilateral    COLONOSCOPY     KNEE ARTHROSCOPY W/ MENISCAL REPAIR  04/06/1998   left   LUMBAR LAMINECTOMY  04/06/2000   PROSTATE BIOPSY     ROBOT ASSISTED LAPAROSCOPIC RADICAL PROSTATECTOMY N/A 04/15/2023   Procedure: XI ROBOTIC ASSISTED LAPAROSCOPIC RADICAL PROSTATECTOMY LEVEL 2/BILATERAL PELVIC LYMPHADENECTOMY;  Surgeon: Heloise Purpura, MD;  Location: [facility] ORS;  Service: Urology;  Laterality: N/A;  210 MINUTES NEEDED FOR CASE    Short Social History:  Social History   Tobacco Use   Smoking status: Former    Current packs/day: 0.00    Types: Cigarettes    Quit date: 10/27/2008    Years since quitting: 14.5   Smokeless tobacco: Never   Tobacco comments:    Quit x 10 yrs.  Substance Use Topics   Alcohol use: Not Currently    Allergies  Allergen Reactions   Norvasc [Amlodipine] Other (See Comments)    Bilateral leg swelling   Zestril [Lisinopril] Swelling and Other (See Comments)    Face and legs swell    Current Outpatient Medications  Medication Sig Dispense Refill   atorvastatin (LIPITOR) 40 MG tablet Take 1 tablet (40 mg total) by mouth daily. 90 tablet 2   bumetanide (BUMEX) 2 MG tablet Take 2 tablets (4 mg total) by mouth 2 (two) times daily. (Patient taking differently: Take 4 mg by mouth every Tuesday, Thursday, Saturday, and Sunday.) 120 tablet 0   carvedilol (COREG) 25 MG tablet Take 25 mg by mouth 2 (two) times daily with a meal.     DULoxetine (CYMBALTA) 20 MG capsule Take 1 capsule (20 mg total) by mouth daily. 30 capsule 3   feeding supplement (BOOST HIGH PROTEIN) LIQD Take 1 Container by mouth daily as needed (Supplement).     gabapentin (NEURONTIN) 100 MG capsule Take 1 capsule (100 mg total) by mouth at bedtime. 90 capsule 3   insulin glargine (LANTUS) 100 UNIT/ML Solostar Pen Inject 5 Units into the skin daily. (Patient taking differently: Inject 15 Units into the skin daily as needed (High Blood Sugar).) 15 mL 1    Insulin Pen Needle (PEN NEEDLES) 32G X 4 MM MISC 1 Needle by Does not apply route daily at 6 (six) AM. 200 each 1   Methoxy PEG-Epoetin Beta (MIRCERA IJ) Mircera     prochlorperazine (COMPAZINE) 5 MG tablet Take 5 mg by mouth as needed for nausea or vomiting.     sertraline (ZOLOFT) 100 MG tablet Take 1 tablet (100 mg total) by mouth daily. 30 tablet 2   sevelamer carbonate (RENVELA) 800 MG tablet Take 800 mg by mouth 3 (three) times daily with meals.     No current facility-administered medications for this visit.    Review of Systems  Constitutional:  Constitutional negative. HENT: HENT negative.  Eyes: Eyes negative.  Respiratory: Respiratory negative.  Cardiovascular: Cardiovascular negative.  GI: Gastrointestinal negative.  Musculoskeletal: Musculoskeletal negative.  Skin: Skin negative.  Neurological: Positive for numbness.  Hematologic: Hematologic/lymphatic negative.  Psychiatric: Psychiatric negative.        Objective:  Objective   Vitals:   05/05/23 0849  BP: 114/72  Pulse: 61  Resp: 20  Temp: 98.1 F (36.7 C)  SpO2: 99%  Weight: 192 lb (87.1 kg)  Height: 5\' 11"  (1.803 m)   Body mass index is 26.78 kg/m.  Physical Exam HENT:     Head: Normocephalic.     Nose: Nose normal.  Eyes:     Pupils: Pupils are equal, round, and reactive to light.  Cardiovascular:     Pulses:          Radial pulses are 2+ on the left side.  Abdominal:     General: Abdomen is flat.  Musculoskeletal:     Right lower leg: No edema.     Left lower leg: No edema.  Skin:    General: Skin is warm.     Capillary Refill: Capillary refill takes less than 2 seconds.  Neurological:     General: No focal deficit present.     Mental Status: He is alert.  Psychiatric:        Mood and Affect: Mood normal.        Thought Content: Thought content normal.        Judgment: Judgment normal.     Data: Access Site: Left Upper Extremity.   Access Type: Brachial-cephalic AVF.    Performing Technologist: Argentina Ponder RVS     Examination Guidelines: A complete evaluation includes B-mode imaging,  spectral  Doppler, color Doppler, and power Doppler as needed of all accessible  portions  of each vessel. Bilateral testing is considered an integral part of a  complete  examination. Limited examinations for reoccurring indications may be  performed  as noted.     Findings:     +---------------------------+-----+-------+--------+                            RightLeft   Comments  +---------------------------+-----+-------+--------+  Brachial                                        +---------------------------+-----+-------+--------+  Radial Ambient                  71 mmHg          +---------------------------+-----+-------+--------+  Radial AV Compression           69 mmHg          +---------------------------+-----+-------+--------+  Ulnar Ambient                                    +---------------------------+-----+-------+--------+  Ulnar AV Compression                             +---------------------------+-----+-------+--------+  2nd Digit Ambient                                +---------------------------+-----+-------+--------+  2nd Digit Ulnar Compression                      +---------------------------+-----+-------+--------+      Summary:  The left radial PSV has no change with compression.      Assessment/Plan:    57 year old male with end-stage renal disease on dialysis via left arm fistula.  Unfortunately he is having numbness on dialysis but he has a palpable radial pulse and no evidence of steal by physical exam or duplex.  As such I recommended continued use of his hand particularly now on dialysis and I will see him on an as-needed basis.     Maeola Harman MD [facility]

## 2023-05-06 ENCOUNTER — Ambulatory Visit (INDEPENDENT_AMBULATORY_CARE_PROVIDER_SITE_OTHER): Payer: Medicaid Other | Admitting: Podiatry

## 2023-05-06 ENCOUNTER — Encounter: Payer: Self-pay | Admitting: Podiatry

## 2023-05-06 DIAGNOSIS — L84 Corns and callosities: Secondary | ICD-10-CM

## 2023-05-06 DIAGNOSIS — M79674 Pain in right toe(s): Secondary | ICD-10-CM | POA: Diagnosis not present

## 2023-05-06 DIAGNOSIS — E1149 Type 2 diabetes mellitus with other diabetic neurological complication: Secondary | ICD-10-CM | POA: Diagnosis not present

## 2023-05-06 DIAGNOSIS — B351 Tinea unguium: Secondary | ICD-10-CM | POA: Diagnosis not present

## 2023-05-06 DIAGNOSIS — M79675 Pain in left toe(s): Secondary | ICD-10-CM | POA: Diagnosis not present

## 2023-05-06 NOTE — Progress Notes (Signed)
Subjective: Chief Complaint  Patient presents with   [facility]    RM#13 DFC/CALLUS BIG TOE    58 year old male presents for above concerns.  States he needs his nails trimmed they are thick elongated cannot do.  He also has very thick calluses.  Does not report any open lesions or any drainage.     Last A1c was 6.5 on April 08, 2023 Lyndle Herrlich, MD-last seen December 24, 2022   Objective: AAO x3, NAD DP/PT pulses palpable bilaterally, CRT less than 3 seconds Sensation decreased with Semmes Weinstein monofilament Nails are hypertrophic, dystrophic, brittle, discolored, elongated 10. No surrounding redness or drainage. Tenderness nails 1-5 bilaterally.  Along the medial aspect of the left hallux and plantar aspect the right foot submetatarsal area there is a large callus noted on the medial aspect the left hallux IPJ.  Upon debridement there is no underlying ulceration drainage or signs of infection.   Rocker-bottom deformity of the right foot. No pain with calf compression, swelling, warmth, erythema  Assessment: Preulcerative large callus; symptomatic onychomycosis  Plan: -All treatment options discussed with the patient including all alternatives, risks, complications.  -Nails debrided x 10 without any complications or bleeding -Sharply debrided the hyperkeratotic lesions x 2 without any complications or bleeding.  Discussed importance of offloading given the callus which can lead infection. -Daily foot inspection. -Patient encouraged to call the office with any questions, concerns, change in symptoms.   Vivi Barrack DPM

## 2023-05-10 ENCOUNTER — Telehealth: Payer: Self-pay | Admitting: Podiatry

## 2023-05-10 NOTE — Telephone Encounter (Signed)
Patient is requesting to speak with Dr. Ardelle Anton regarding Letter of Emotional Support for his animal. Patient contact telephone number: [phone]

## 2023-05-17 ENCOUNTER — Telehealth: Payer: Self-pay

## 2023-05-17 NOTE — Telephone Encounter (Signed)
 Requesting a letter for emotional support animal. Pt is moving to a new apartment, and they're requesting this. Please call pt back.

## 2023-05-25 NOTE — Telephone Encounter (Signed)
 Upcoming telehealth appt 05/27/2023 @ 1:45 with Dr. Geraldo Pitter.

## 2023-05-27 ENCOUNTER — Other Ambulatory Visit: Payer: Self-pay | Admitting: Student

## 2023-05-27 ENCOUNTER — Ambulatory Visit (INDEPENDENT_AMBULATORY_CARE_PROVIDER_SITE_OTHER): Payer: Medicare Other | Admitting: Student

## 2023-05-27 DIAGNOSIS — F32A Depression, unspecified: Secondary | ICD-10-CM

## 2023-05-27 NOTE — Progress Notes (Signed)
  [facility] Internal Medicine Residency Telephone Encounter Continuity Care Appointment  HPI:  This telephone encounter was created for Mr. Audric Venn Despain on 05/27/2023 for the following purpose/cc discussed emotional support animal letter.   Past Medical History:  Past Medical History:  Diagnosis Date   Acute exacerbation of CHF (congestive heart failure) (HCC) 12/31/2021   Anxiety    Panic Atacks   Arthritis    Carpal tunnel syndrome    Chronic kidney disease    Constipation    Depression    Diabetes mellitus    DMII   Dysrhythmia    hx tachy hr   GERD (gastroesophageal reflux disease)    no problem in past few years.   Heart disease    Heart murmur    Hepatitis    Hyperlipidemia    Hypertension    Nausea and vomiting 11/12/2022   Neuropathy associated with endocrine disorder (HCC)    Seizures (HCC)    "stress related" no more since taking Klonopin 2 years- See Neurologist  [facility]   Shortness of breath dyspnea    with exertion   Sleep apnea    used a cpap when he was 350lb-does not need one now 260lb   Wears contact lenses      ROS:     Assessment / Plan / Recommendations:  Depression Telehealth visit today to discuss usefulness of an emotional support animal to help with his depression.  After his brother unfortunately passed away in 06/23/2014 the patient had adopted a Lacretia Nicks named Tonna Corner who has had a very positive impact on his depression.  At his last visit on 11/12/2022 his sertraline was increased from 50 mg to 100 mg daily and this has also had some improvement in his mood.  Unfortunately after moving to a new apartment he has been away from his dog.  I do believe his continued support of his dog is an important part of his depression treatment and we will write a letter today to hopefully advance his apartment building to allow him keep this dog in his apartment.    As always, pt is advised that if symptoms worsen or new symptoms arise, they should go to an  urgent care facility or to to ER for further evaluation.   Consent and Medical Decision Making:  Patient discussed with Dr. Mayford Knife This is a telephone encounter between Charlcie Cradle Fontaine and Rocky Morel on 05/27/2023 for discussion of an emotional support animal letter. The visit was conducted with the patient located at home and Rocky Morel at [facility]. The patient's identity was confirmed using their DOB and current address. The patient has consented to being evaluated through a telephone encounter and understands the associated risks (an examination cannot be done and the patient may need to come in for an appointment) / benefits (allows the patient to remain at home, decreasing exposure to coronavirus). I personally spent 10 minutes on medical discussion.    Lili 06/23/14 8 shitszu

## 2023-05-27 NOTE — Assessment & Plan Note (Signed)
 Telehealth visit today to discuss usefulness of an emotional support animal to help with his depression.  After his brother unfortunately passed away in June 23, 2014 the patient had adopted a [name] named Tonna Corner who has had a very positive impact on his depression.  At his last visit on 11/12/2022 his sertraline was increased from 50 mg to 100 mg daily and this has also had some improvement in his mood.  Unfortunately after moving to a new apartment he has been away from his dog.  I do believe his continued support of his dog is an important part of his depression treatment and we will write a letter today to hopefully advance his apartment building to allow him keep this dog in his apartment.

## 2023-05-31 NOTE — Progress Notes (Signed)
 Internal Medicine Clinic Attending  Case discussed with the resident at the time of the visit.  We reviewed the resident's history and exam and pertinent patient test results.  I agree with the assessment, diagnosis, and plan of care documented in the resident's note.

## 2023-06-24 ENCOUNTER — Encounter: Payer: Medicare Other | Admitting: Student

## 2023-06-29 ENCOUNTER — Other Ambulatory Visit: Payer: Self-pay

## 2023-06-29 ENCOUNTER — Ambulatory Visit (HOSPITAL_COMMUNITY)
Admission: AD | Admit: 2023-06-29 | Discharge: 2023-06-29 | Disposition: A | Discharge status: Home / Self Care | Source: Ambulatory Visit | Attending: Nephrology | Admitting: Nephrology

## 2023-06-29 ENCOUNTER — Encounter (HOSPITAL_COMMUNITY)
Admission: AD | Disposition: A | Discharge status: Home / Self Care | Payer: Self-pay | Source: Ambulatory Visit | Attending: Nephrology

## 2023-06-29 DIAGNOSIS — I871 Compression of vein: Secondary | ICD-10-CM | POA: Insufficient documentation

## 2023-06-29 DIAGNOSIS — T82858A Stenosis of vascular prosthetic devices, implants and grafts, initial encounter: Secondary | ICD-10-CM | POA: Insufficient documentation

## 2023-06-29 DIAGNOSIS — E1122 Type 2 diabetes mellitus with diabetic chronic kidney disease: Secondary | ICD-10-CM | POA: Insufficient documentation

## 2023-06-29 DIAGNOSIS — I509 Heart failure, unspecified: Secondary | ICD-10-CM | POA: Diagnosis not present

## 2023-06-29 DIAGNOSIS — Z833 Family history of diabetes mellitus: Secondary | ICD-10-CM | POA: Insufficient documentation

## 2023-06-29 DIAGNOSIS — Z794 Long term (current) use of insulin: Secondary | ICD-10-CM | POA: Diagnosis not present

## 2023-06-29 DIAGNOSIS — D631 Anemia in chronic kidney disease: Secondary | ICD-10-CM | POA: Insufficient documentation

## 2023-06-29 DIAGNOSIS — Z87891 Personal history of nicotine dependence: Secondary | ICD-10-CM | POA: Insufficient documentation

## 2023-06-29 DIAGNOSIS — Z8249 Family history of ischemic heart disease and other diseases of the circulatory system: Secondary | ICD-10-CM | POA: Insufficient documentation

## 2023-06-29 DIAGNOSIS — I132 Hypertensive heart and chronic kidney disease with heart failure and with stage 5 chronic kidney disease, or end stage renal disease: Secondary | ICD-10-CM | POA: Diagnosis not present

## 2023-06-29 DIAGNOSIS — N186 End stage renal disease: Secondary | ICD-10-CM | POA: Insufficient documentation

## 2023-06-29 DIAGNOSIS — Y832 Surgical operation with anastomosis, bypass or graft as the cause of abnormal reaction of the patient, or of later complication, without mention of misadventure at the time of the procedure: Secondary | ICD-10-CM | POA: Insufficient documentation

## 2023-06-29 DIAGNOSIS — Z992 Dependence on renal dialysis: Secondary | ICD-10-CM | POA: Diagnosis not present

## 2023-06-29 DIAGNOSIS — E119 Type 2 diabetes mellitus without complications: Secondary | ICD-10-CM

## 2023-06-29 HISTORY — PX: VENOUS ANGIOPLASTY: CATH118376

## 2023-06-29 HISTORY — PX: A/V SHUNT INTERVENTION: CATH118220

## 2023-06-29 LAB — POCT I-STAT, CHEM 8
BUN: 36 mg/dL — ABNORMAL HIGH (ref 6–20)
Calcium, Ion: 1.09 mmol/L — ABNORMAL LOW (ref 1.15–1.40)
Chloride: 95 mmol/L — ABNORMAL LOW (ref 98–111)
Creatinine, Ser: 6.2 mg/dL — ABNORMAL HIGH (ref 0.61–1.24)
Glucose, Bld: 173 mg/dL — ABNORMAL HIGH (ref 70–99)
HCT: 31 % — ABNORMAL LOW (ref 39.0–52.0)
Hemoglobin: 10.5 g/dL — ABNORMAL LOW (ref 13.0–17.0)
Potassium: 3.7 mmol/L (ref 3.5–5.1)
Sodium: 138 mmol/L (ref 135–145)
TCO2: 30 mmol/L (ref 22–32)

## 2023-06-29 SURGERY — A/V SHUNT INTERVENTION
Anesthesia: LOCAL

## 2023-06-29 MED ORDER — MIDAZOLAM HCL 2 MG/2ML IJ SOLN
INTRAMUSCULAR | Status: AC
  Filled 2023-06-29: qty 2

## 2023-06-29 MED ORDER — IODIXANOL 320 MG/ML IV SOLN
INTRAVENOUS | Status: DC | PRN
  Administered 2023-06-29: 8 mL via INTRAVENOUS

## 2023-06-29 MED ORDER — HEPARIN (PORCINE) IN NACL 1000-0.9 UT/500ML-% IV SOLN
INTRAVENOUS | Status: DC | PRN
  Administered 2023-06-29: 500 mL

## 2023-06-29 MED ORDER — FENTANYL CITRATE (PF) 100 MCG/2ML IJ SOLN
INTRAMUSCULAR | Status: AC
  Filled 2023-06-29: qty 2

## 2023-06-29 MED ORDER — LIDOCAINE HCL (PF) 1 % IJ SOLN
INTRAMUSCULAR | Status: DC | PRN
  Administered 2023-06-29: 2 mL via INTRADERMAL

## 2023-06-29 MED ORDER — SODIUM CHLORIDE 0.9 % IV SOLN: INTRAVENOUS | Status: DC

## 2023-06-29 MED ORDER — FREESTYLE LIBRE 3 READER DEVI: 1 refills | Status: AC

## 2023-06-29 MED ORDER — MIDAZOLAM HCL 2 MG/2ML IJ SOLN
INTRAMUSCULAR | Status: DC | PRN
  Administered 2023-06-29: 1 mg via INTRAVENOUS

## 2023-06-29 MED ORDER — LIDOCAINE HCL (PF) 1 % IJ SOLN
INTRAMUSCULAR | Status: AC
  Filled 2023-06-29: qty 30

## 2023-06-29 MED ORDER — FENTANYL CITRATE (PF) 100 MCG/2ML IJ SOLN
INTRAMUSCULAR | Status: DC | PRN
  Administered 2023-06-29: 25 ug via INTRAVENOUS

## 2023-06-29 MED ORDER — FREESTYLE LIBRE 3 PLUS SENSOR MISC: 3 refills | Status: AC

## 2023-06-29 SURGICAL SUPPLY — 9 items
BAG SNAP BAND KOVER 36X36 (MISCELLANEOUS) IMPLANT
BALLN ATHLETIS 7X40X75 (BALLOONS) ×2 IMPLANT
BALLOON ATHLETIS 7X40X75 (BALLOONS) IMPLANT
GUIDEWIRE ANGLED .035 180CM (WIRE) IMPLANT
PACK EP LF (CUSTOM PROCEDURE TRAY) IMPLANT
SHEATH PINNACLE R/O II 7F 4CM (SHEATH) IMPLANT
SYR MEDALLION 10ML (SYRINGE) IMPLANT
SYR MEDALLION 3ML (SYRINGE) IMPLANT
WIRE MICRO SET SILHO 5FR 7 (SHEATH) IMPLANT

## 2023-06-29 NOTE — Telephone Encounter (Signed)
 Patient agreed [facility].  Told patient I can help him if needed. He says he thinks he has used a CGM before.  Request prescriptions.

## 2023-06-29 NOTE — Telephone Encounter (Signed)
 Copied from CRM [id number]. Topic: General - Other >> Jun 28, 2023  4:53 PM Antony Haste wrote: Reason for CRM: Alondra with [facility] states they faxed a request on 03/14 on behalf of this patient. She wanted to verify if has been received yet, advised it has not. Re-confirmed the office's # to ensure it's sent properly. Once its been received, Darlina Rumpf is requesting for a staffmember to follow-up, she provided their office number. Callback [phone]

## 2023-06-29 NOTE — H&P (Signed)
 [name] is an 58 y.o. male.   Chief Complaint: Cannulation difficulties HPI:  58 year old man with past medical history significant for type 2 diabetes mellitus, hypertension, dyslipidemia, depression, sleep apnea and end-stage renal disease on hemodialysis.  He presents for concerns of cannulation difficulties of his left brachiocephalic fistula that was created in June 2024.  He did have some Sarkis steal symptoms earlier following fistula creation but also had a history of carpal tunnel without need to revise fistula.  He denies any constitutional complaints including fever or chills and does not have any chest pain or shortness of breath.  Past Medical History:  Diagnosis Date   Acute exacerbation of CHF (congestive heart failure) (HCC) 12/31/2021   Anxiety    Panic Atacks   Arthritis    Carpal tunnel syndrome    Chronic kidney disease    Constipation    Depression    Diabetes mellitus    DMII   Dysrhythmia    hx tachy hr   GERD (gastroesophageal reflux disease)    no problem in past few years.   Heart disease    Heart murmur    Hepatitis    Hyperlipidemia    Hypertension    Nausea and vomiting 11/12/2022   Neuropathy associated with endocrine disorder (HCC)    Seizures (HCC)    "stress related" no more since taking Klonopin 2 years- See Neurologist  [facility]   Shortness of breath dyspnea    with exertion   Sleep apnea    used a cpap when he was 350lb-does not need one now 260lb   Wears contact lenses     Past Surgical History:  Procedure Laterality Date   AV FISTULA PLACEMENT Left 09/11/2022   Procedure: LEFT ARM BRACHIOCEPHALIC ARTERIOVENOUS (AV) FISTULA CREATION;  Surgeon: Maeola Harman, MD;  Location: [facility] OR;  Service: Vascular;  Laterality: Left;   CATARACT EXTRACTION Bilateral    COLONOSCOPY     KNEE ARTHROSCOPY W/ MENISCAL REPAIR  04/06/1998   left   LUMBAR LAMINECTOMY  04/06/2000   PROSTATE BIOPSY     ROBOT ASSISTED LAPAROSCOPIC  RADICAL PROSTATECTOMY N/A 04/15/2023   Procedure: XI ROBOTIC ASSISTED LAPAROSCOPIC RADICAL PROSTATECTOMY LEVEL 2/BILATERAL PELVIC LYMPHADENECTOMY;  Surgeon: Heloise Purpura, MD;  Location: [facility] ORS;  Service: Urology;  Laterality: N/A;  210 MINUTES NEEDED FOR CASE    Family History  Problem Relation Age of Onset   Cancer Mother        pancres   Diabetes Mother    Hypertension Brother    Renal cancer Brother    Cancer Maternal Uncle    Colon cancer Maternal Uncle    Colon cancer Maternal Uncle    Stomach cancer Maternal Uncle    Esophageal cancer Neg Hx    Rectal cancer Neg Hx    Social History:  reports that he quit smoking about 14 years ago. His smoking use included cigarettes. He has never used smokeless tobacco. He reports that he does not currently use alcohol. He reports current drug use. Frequency: 7.00 times per week. Drug: Marijuana.  Allergies:  Allergies  Allergen Reactions   Norvasc [Amlodipine] Other (See Comments)    Bilateral leg swelling   Zestril [Lisinopril] Swelling and Other (See Comments)    Face and legs swell    Medications Prior to Admission  Medication Sig Dispense Refill   atorvastatin (LIPITOR) 40 MG tablet Take 1 tablet (40 mg total) by mouth daily. 90 tablet 2   bumetanide (BUMEX) 2 MG  tablet Take 2 tablets (4 mg total) by mouth 2 (two) times daily. (Patient taking differently: Take 4 mg by mouth 2 (two) times daily.) 120 tablet 0   carvedilol (COREG) 25 MG tablet Take 25 mg by mouth 2 (two) times daily with a meal.     DULoxetine (CYMBALTA) 20 MG capsule Take 1 capsule (20 mg total) by mouth daily. 30 capsule 3   gabapentin (NEURONTIN) 100 MG capsule Take 1 capsule (100 mg total) by mouth at bedtime. 90 capsule 3   insulin glargine (LANTUS) 100 UNIT/ML Solostar Pen Inject 5 Units into the skin daily. (Patient taking differently: Inject 15 Units into the skin daily as needed (High Blood Sugar).) 15 mL 1   prochlorperazine (COMPAZINE) 5 MG tablet Take 5 mg  by mouth every 6 (six) hours as needed for nausea or vomiting.     sertraline (ZOLOFT) 100 MG tablet TAKE 1 TABLET BY MOUTH DAILY 30 tablet 0   sevelamer carbonate (RENVELA) 800 MG tablet Take 1,600-2,400 mg by mouth 3 (three) times daily with meals. 800 mg -1600 mg with snack     CALCITRIOL PO Take by mouth.     Cinacalcet HCl (SENSIPAR PO) Take by mouth.     feeding supplement (BOOST HIGH PROTEIN) LIQD Take 1 Container by mouth daily as needed (Supplement).     Insulin Pen Needle (PEN NEEDLES) 32G X 4 MM MISC 1 Needle by Does not apply route daily at 6 (six) AM. 200 each 1   iron sucrose (VENOFER) 20 MG/ML injection Iron Sucrose (Venofer)     Methoxy PEG-Epoetin Beta (MIRCERA IJ) Mircera      No results found for this or any previous visit (from the past 48 hours). No results found.  Review of Systems  All other systems reviewed and are negative.   Blood pressure 133/79, pulse 72, resp. rate 14, SpO2 100%. Physical Exam Vitals and nursing note reviewed.  Constitutional:      General: He is not in acute distress.    Appearance: Normal appearance. He is normal weight.  HENT:     Head: Normocephalic and atraumatic.     Nose: Nose normal.     Mouth/Throat:     Mouth: Mucous membranes are dry.     Pharynx: Oropharynx is clear.  Eyes:     Extraocular Movements: Extraocular movements intact.     Conjunctiva/sclera: Conjunctivae normal.  Cardiovascular:     Rate and Rhythm: Normal rate and regular rhythm.  Pulmonary:     Effort: Pulmonary effort is normal.     Breath sounds: Normal breath sounds.  Musculoskeletal:     Cervical back: Normal range of motion and neck supple.     Comments: Left brachiocephalic fistula is hyper pulsatile on exam with a very muffled bruit through its course.  The fistula does not collapse with arm elevation  Neurological:     Mental Status: He is alert.      Assessment/Plan 1.  Cannulation difficulties: Will undertake fistulogram today to evaluate  for etiology and offer management with angioplasty if indicated.  The procedure was explained to the patient and he consents to proceed after weighing risks and benefits. 2.  End-stage renal disease: Continue hemodialysis on MWF schedule following completion of fistulogram today. 3.  Hypertension: Blood pressure marginally elevated and will be monitored with moderate sedation during angiogram today. 4.  Anemia: Without overt blood loss, will continue to follow hemoglobin and hematocrit along with ESA protocol per outpatient dialysis.  Vonna Kotyk  Concha Se, MD 06/29/2023, 1:19 PM

## 2023-06-29 NOTE — Op Note (Signed)
 Patient presents with decreased access flows of his left BCF (placed June 2024).  This is his first endovascular procedure. On examination, the brachial cephalic fistula is hyperpulsatile with a poor thrill and low pitched bruit in the outflow. Augmentation is normal.  Summary:  1)      The patient had successful angioplasty (7 mm Athletis FE ~24 atm) of significant stenosis in the cephalic vein arch NOT involving the confluence.  2)      The body of the cephalic vein fistula was patent with good flows. 3)      The left central veins were widely patent. 4)      This left BCF remains amenable to future percutaneous intervention.  Description of procedure: The arm was prepped and draped in the usual sterile fashion. The left upper arm brachial cephalic fistula was cannulated (09811) with a 21-gauge micropuncture needle directed in an antegrade direction. A guidewire was inserted and exchanged for a 7Fr sheath. Contrast [phone]) injection via the side port of the sheath was performed. The angiogram of the fistula (29562) showed a patent body of the left BCF, patent outflow cephalic vein and a 90% cephalic vein arch stenosis not involving the confluence. The inflow cephalic vein and left central veins were patent.  The wire was advanced centrally with minimal difficulty. A 7 x 4 mm Athletis angioplasty balloon was inserted over a glide wire and positioned at the cephalic vein arch stenosis. Venous angioplasty (13086) was carried out to 24 ATM with FULL effacement of the waist on the balloon at the arch lesion.  Repeat angiogram showed 10% residual at the site of angioplasty with no evidence of extravasation.  The flow of contrast was quicker and the fistula was markedly less pulsatile.  Hemostasis: A 3-0 ethilon purse string suture was placed at the cannulation site on removal of the sheath.  Sedation: 1mg  Versed, Fentanyl. Sedation time. 11 minutes  Contrast. 8 mL  Monitoring: Because of the  patient's comorbid conditions and sedation during the procedure, continuous EKG monitoring and O2 saturation monitoring was performed throughout the procedure by the RN. There were no abnormal arrhythmias encountered.  Complications: None.   Diagnoses: I87.1 Stricture of vein  N18.6 ESRD T82.858A Stricture of access  Procedure Coding:  [id number] Cannulation and angiogram of fistula, venous angioplasty (cephalic vein arch) N6295 Contrast  Recommendations:  1. Continue to cannulate the fistula with 15G needles.  2. Refer back for problems with flows. 3. Remove the suture next treatment.   Discharge: The patient was discharged home in stable condition. The patient was given education regarding the care of the dialysis access AVF and specific instructions in case of any problems.

## 2023-06-30 ENCOUNTER — Encounter (HOSPITAL_COMMUNITY): Payer: Self-pay | Admitting: Nephrology

## 2023-07-08 ENCOUNTER — Ambulatory Visit: Payer: Medicaid Other | Admitting: Podiatry

## 2023-07-08 ENCOUNTER — Telehealth: Payer: Self-pay | Admitting: Student

## 2023-07-08 NOTE — Telephone Encounter (Signed)
 AttnMel Almond  Patient Higuchi stated he would like his meds sent to [facility] [place], [place] - [address] Phone: [phone]  Fax: [phone]

## 2023-07-08 NOTE — Telephone Encounter (Signed)
 I called the patient to confirm where we need to send his rx. Unable to reach him I left him a voicemail.

## 2023-07-08 NOTE — Telephone Encounter (Signed)
Attempt #2 no answer

## 2023-07-08 NOTE — Telephone Encounter (Signed)
 Copied from CRM [id number]. Topic: Clinical - Medication Question >> Jul 08, 2023 10:55 AM Philippa Chester F wrote: Reason for CRM:   What is the name of the person calling?  Corrie Dandy    Where are you calling from?  [facility]  Information concerning patient: They sent over a fax to put in for Continuous Glucose Receiver (FREESTYLE LIBRE 3 READER)  (glucose blood monitor) for the patient. They are looking to confirm if the fax was received and any possible update on the prescription.   Agent: Looks like the prescription was sent to the patient's listed pharmacy when the request was for it to be sent to [facility]. Please follow up with the patient to confirm.   Contact information:  [facility]  Ph: [phone]  F: [phone]

## 2023-07-08 NOTE — Telephone Encounter (Signed)
 Rx for freestyle libre reader and sensor were sent 06/29/23. I have called the pharmacy the pharmacist Alissa stated his medications were delivered 8 days ago with a 90 day supply to the address we have on file (the same address that they have). I have called the patient again I was unable to reach him, I left him a voicemail with the information provided above. Please rely this message if the patient calls back.

## 2023-07-12 ENCOUNTER — Telehealth: Payer: Self-pay | Admitting: Dietician

## 2023-07-12 NOTE — Telephone Encounter (Signed)
 Spoke with patient. He is using his freestyle libre 3 CGM he received from [employer]. No problems and as stated in previous note, he does not want to use [facility]. Will return fax to [facility] with "patient refuses" written on it.

## 2023-07-13 ENCOUNTER — Other Ambulatory Visit: Payer: Self-pay | Admitting: Student

## 2023-07-13 ENCOUNTER — Telehealth: Payer: Self-pay | Admitting: *Deleted

## 2023-07-13 ENCOUNTER — Ambulatory Visit: Payer: Medicaid Other | Admitting: Physical Therapy

## 2023-07-13 DIAGNOSIS — F32A Depression, unspecified: Secondary | ICD-10-CM

## 2023-07-13 NOTE — Telephone Encounter (Signed)
 Medication sent to pharmacy

## 2023-07-13 NOTE — Telephone Encounter (Signed)
  Communication  Reason for CRM: Patient wants to leave a message for the provider or nurse to call [employer] [facility] [phone] for prior authorization to verify that the patient has diabetics.

## 2023-07-13 NOTE — Telephone Encounter (Deleted)
 Communication  Reason for CRM: Patient wants to leave a message for the provider or nurse to call [employer] [phone] for prior authorization to verify that the patient has diabetics.

## 2023-07-13 NOTE — Telephone Encounter (Signed)
 Pt stated they need verification that pt has diabetes in order to stay in the program. I called [facility] Special Needs Plan @ [phone]. I talked to Darral Dash and verified pt has diabetes. Pt was called and informed.

## 2023-07-15 ENCOUNTER — Encounter: Payer: Self-pay | Admitting: Podiatry

## 2023-07-15 ENCOUNTER — Ambulatory Visit (INDEPENDENT_AMBULATORY_CARE_PROVIDER_SITE_OTHER): Admitting: Podiatry

## 2023-07-15 DIAGNOSIS — M79675 Pain in left toe(s): Secondary | ICD-10-CM

## 2023-07-15 DIAGNOSIS — L97522 Non-pressure chronic ulcer of other part of left foot with fat layer exposed: Secondary | ICD-10-CM

## 2023-07-15 DIAGNOSIS — L84 Corns and callosities: Secondary | ICD-10-CM

## 2023-07-15 DIAGNOSIS — E1149 Type 2 diabetes mellitus with other diabetic neurological complication: Secondary | ICD-10-CM | POA: Diagnosis not present

## 2023-07-15 DIAGNOSIS — M79674 Pain in right toe(s): Secondary | ICD-10-CM

## 2023-07-15 DIAGNOSIS — B351 Tinea unguium: Secondary | ICD-10-CM | POA: Diagnosis not present

## 2023-07-15 MED ORDER — SILVER SULFADIAZINE 1 % EX CREA: 1.0000 | TOPICAL_CREAM | Freq: Every day | CUTANEOUS | 0 refills | Status: DC

## 2023-07-15 MED ORDER — DOXYCYCLINE HYCLATE 100 MG PO TABS: 100.0000 mg | ORAL_TABLET | Freq: Two times a day (BID) | ORAL | 0 refills | Status: DC

## 2023-07-17 NOTE — Progress Notes (Signed)
 Subjective: Chief Complaint  Patient presents with   Callouses    RM#12 Left foot big toe callus no pain.     58 year old male presents for above concerns.  He is concerned about the thick callus on the medial aspect of the left big toe.  Denies any drainage or pus.  He did soak it this morning.  His nails are also thickened elongated he denies any swelling redness or drainage but they are starting to cause discomfort as they become elongated.  Last A1c was 6.5 on April 08, 2023 Jeannene Milling, MD-last seen December 24, 2022   Objective: AAO x3, NAD DP/PT pulses palpable bilaterally, CRT less than 3 seconds Sensation decreased with Semmes Weinstein monofilament Nails are hypertrophic, dystrophic, brittle, discolored, elongated 10. No surrounding redness or drainage. Tenderness nails 1-5 bilaterally.  Hyperkeratotic lesion right midfoot plantarly without any underlying ulceration, drainage or signs of infection. Along the medial aspect left hallux is a very thick hyperkeratotic lesion.  This was easily debrided both today and there is a macerated tissue present but there is a granular wound present underneath this measuring about 0.6 x 0.5 x 0.1 cm.  There is no probing, undermining or tunneling.  There is no surrounding erythema but there is some malodor which I think is coming more from the macerated tissue.  There is no fluctuation or crepitation.   No pain with calf compression, swelling, warmth, erythema  Assessment: Ulcer left hallux; symptomatic onychomycosis  Plan: Onychomycosis -Nails debrided x 10 without any complications or bleeding  Preulcerative lesion right foot -Sharply debrided lesion x 1 without any complications or bleeding.  Moisturizer, offloading.  Ulceration left hallux -Medically necessary wound debridement was performed today.  Sharply debrided the thick hyperkeratotic lesion with a #312 with scalpel to reveal the underlying granular wound.  I was  not able to measure the ulcer prior to debridement as it was covered with callus but  postoperative wound dimensions are noted above.  Cleaned the area.  Silvadene was applied followed by dressing.  Continue daily dressing changes, offloading at all times.  Prescribed doxycycline.  Unfortunately discussed the risk of amputation.  Return in about 2 weeks (around 07/29/2023) for toe ulcer, x-ray.  Charity Conch DPM

## 2023-07-20 ENCOUNTER — Ambulatory Visit: Payer: Medicaid Other | Admitting: Physical Therapy

## 2023-07-27 ENCOUNTER — Encounter: Payer: Medicaid Other | Admitting: Physical Therapy

## 2023-07-29 ENCOUNTER — Ambulatory Visit (INDEPENDENT_AMBULATORY_CARE_PROVIDER_SITE_OTHER)

## 2023-07-29 ENCOUNTER — Ambulatory Visit (INDEPENDENT_AMBULATORY_CARE_PROVIDER_SITE_OTHER): Admitting: Podiatry

## 2023-07-29 ENCOUNTER — Encounter: Payer: Self-pay | Admitting: Podiatry

## 2023-07-29 DIAGNOSIS — M7751 Other enthesopathy of right foot: Secondary | ICD-10-CM

## 2023-07-29 DIAGNOSIS — L97522 Non-pressure chronic ulcer of other part of left foot with fat layer exposed: Secondary | ICD-10-CM | POA: Diagnosis not present

## 2023-07-29 DIAGNOSIS — M14671 Charcot's joint, right ankle and foot: Secondary | ICD-10-CM

## 2023-07-29 DIAGNOSIS — M778 Other enthesopathies, not elsewhere classified: Secondary | ICD-10-CM

## 2023-07-29 DIAGNOSIS — M19079 Primary osteoarthritis, unspecified ankle and foot: Secondary | ICD-10-CM | POA: Diagnosis not present

## 2023-07-29 NOTE — Patient Instructions (Signed)
  Wash with soap and water , dry well.  Apply a small amount of the Silvadene  cream followed by dressing daily.  Monitor for any signs/symptoms of infection. Call the office immediately if any occur or go directly to the emergency room. Call with any questions/concerns.

## 2023-08-03 ENCOUNTER — Encounter: Payer: Medicaid Other | Admitting: Physical Therapy

## 2023-08-03 NOTE — Progress Notes (Signed)
 Subjective: Chief Complaint  Patient presents with   Foot Ulcer    RM#12 Follow up left foot ulcer patient states is in a healing state.      58 year old male presents for above concerns.  States he is doing better.  Continue with Silvadene  dressing changes daily.  He has not seen any drainage or pus.  No increase in swelling or redness.  Does not report any fevers or chills.  No other concerns.    Last A1c was 6.5 on April 08, 2023 Jeannene Milling, MD-last seen December 24, 2022   Objective: AAO x3, NAD DP/PT pulses palpable bilaterally, CRT less than 3 seconds Sensation decreased with Semmes Weinstein monofilament Along the medial aspect left hallux is a hyperkeratotic lesion.  I was unable to see a wound prior to debridement but after I debrided the hyperkeratotic tissue there is still granular superficial wound measuring 0.2 x 0.2 x 0.1 cm without any probing to bone, undermining or tunneling.  There is no surrounding erythema, ascending cellulitis.  No fluctuation or crepitation.  There is no malodor.  No other open lesion identified at this time Decreased joint motion of the first MTPJ. No pain with calf compression, swelling, warmth, erythema  Assessment: Ulcer left hallux, improving  Plan: -X-rays obtained reviewed.  Multiple views were obtained.  No definitive cortical changes suggest osteomyelitis at this time.  There is no soft tissue emphysema.  Arthritic changes present the first MPJ.  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 -Medically necessary wound debridement was performed today.  Sharply debrided the thick hyperkeratotic lesion with a #312 with scalpel to reveal the underlying granular wound.  I was not able to measure the ulcer prior to debridement as it was covered with callus but postdebridement wound dimensions are noted above.  Cleaned the area.  Silvadene  was applied followed by dressing.  Continue daily  dressing changes, offloading at all times.   -Continue offloading -Monitor for any clinical signs or symptoms of infection and directed to call the office immediately should any occur or go to the ER.   Return in about 4 weeks (around 08/26/2023).  Charity Conch DPM

## 2023-08-16 ENCOUNTER — Other Ambulatory Visit: Payer: Self-pay

## 2023-08-16 MED ORDER — PROCHLORPERAZINE MALEATE 5 MG PO TABS: 5.0000 mg | ORAL_TABLET | Freq: Four times a day (QID) | ORAL | 6 refills | Status: DC | PRN

## 2023-08-26 ENCOUNTER — Ambulatory Visit (INDEPENDENT_AMBULATORY_CARE_PROVIDER_SITE_OTHER): Admitting: Podiatry

## 2023-08-26 ENCOUNTER — Ambulatory Visit

## 2023-08-26 ENCOUNTER — Encounter: Payer: Self-pay | Admitting: Podiatry

## 2023-08-26 DIAGNOSIS — E1149 Type 2 diabetes mellitus with other diabetic neurological complication: Secondary | ICD-10-CM | POA: Diagnosis not present

## 2023-08-26 DIAGNOSIS — L84 Corns and callosities: Secondary | ICD-10-CM | POA: Diagnosis not present

## 2023-08-30 NOTE — Progress Notes (Signed)
 Subjective: Chief Complaint  Patient presents with   Diabetic Ulcer    F/U ulcer left foot great toe. IDDM A1C 5.5. Debridement 4 weeks ago. 0 pain.     58 year old male presents for above concerns.  States he is doing better.  He is continue with Silvadene  dressing changes daily.  Denies any swelling, redness or any drainage.  He does not report any fevers or chills and has no other concerns today.   Last A1c was 6.5 on April 08, 2023  Objective: AAO x3, NAD DP/PT pulses palpable bilaterally, CRT less than 3 seconds Sensation decreased with Semmes Weinstein monofilament Along the medial aspect left hallux is a hyperkeratotic lesion, which is not as thick as it has been previously.  Once I debrided this there is preulcerative but appears that the underlying ulceration is healed.  There is no surrounding erythema, ascending cellulitis.  There is no fluctuation or crepitation.  There is no malodor. No pain with calf compression, swelling, warmth, erythema  Assessment: Ulcer left hallux, improving  Plan: -Sharply debrided the hyperkeratotic lesion.  I debrided the #312 with scalpel to remove nonviable tissue.  Appears underlying wound is healed with area still preulcerative.  Still to continue with daily dressing changes, offloading. -Monitor for any clinical signs or symptoms of infection and directed to call the office immediately should any occur or go to the ER.  Return in about 4 weeks (around 09/23/2023) for pre-ulcerative callus and nail tirm .  Charity Conch DPM

## 2023-09-08 ENCOUNTER — Other Ambulatory Visit: Payer: Self-pay | Admitting: Student

## 2023-09-08 DIAGNOSIS — F32A Depression, unspecified: Secondary | ICD-10-CM

## 2023-09-08 NOTE — Telephone Encounter (Signed)
 Medication sent to pharmacy

## 2023-09-09 ENCOUNTER — Other Ambulatory Visit: Payer: Self-pay | Admitting: Student

## 2023-09-09 DIAGNOSIS — E119 Type 2 diabetes mellitus without complications: Secondary | ICD-10-CM

## 2023-09-09 NOTE — Telephone Encounter (Signed)
 Last Fill: 09/14/22  Last OV: 05/27/23 Next OV: None Scheduled  Routing to provider for review/authorization.

## 2023-09-09 NOTE — Telephone Encounter (Signed)
 Copied from CRM [id number]. Topic: Clinical - Medication Refill >> Sep 09, 2023  8:52 AM Latavia C wrote: Medication: insulin  glargine (LANTUS ) 100 UNIT/ML Solostar Pen   Has the patient contacted their pharmacy? Yes (Agent: If no, request that the patient contact the pharmacy for the refill. If patient does not wish to contact the pharmacy document the reason why and proceed with request.) (Agent: If yes, when and what did the pharmacy advise?)  This is the patient's preferred pharmacy:  [facility] - [place], [place] - [address] [place] [address] Phone: [phone] Fax: [phone]  Is this the correct pharmacy for this prescription? Yes If no, delete pharmacy and type the correct one.   Has the prescription been filled recently? No  Is the patient out of the medication? Yes  Has the patient been seen for an appointment in the last year OR does the patient have an upcoming appointment? Yes  Can we respond through MyChart? Yes  Agent: Please be advised that Rx refills may take up to 3 business days. We ask that you follow-up with your pharmacy.

## 2023-09-13 MED ORDER — INSULIN GLARGINE 100 UNIT/ML SOLOSTAR PEN: 5.0000 [IU] | PEN_INJECTOR | Freq: Every day | SUBCUTANEOUS | 1 refills | Status: DC

## 2023-09-16 ENCOUNTER — Ambulatory Visit (HOSPITAL_COMMUNITY): Admission: RE | Admit: 2023-09-16 | Source: Home / Self Care | Admitting: Surgery

## 2023-09-16 ENCOUNTER — Encounter (HOSPITAL_COMMUNITY): Admission: RE | Payer: Self-pay | Source: Home / Self Care

## 2023-09-16 SURGERY — A/V SHUNT INTERVENTION
Anesthesia: LOCAL

## 2023-09-23 ENCOUNTER — Ambulatory Visit (HOSPITAL_COMMUNITY): Admit: 2023-09-23 | Admitting: Vascular Surgery

## 2023-09-23 ENCOUNTER — Ambulatory Visit (HOSPITAL_COMMUNITY)
Admission: RE | Admit: 2023-09-23 | Discharge: 2023-09-23 | Disposition: A | Discharge status: Home / Self Care | Attending: Vascular Surgery | Admitting: Vascular Surgery

## 2023-09-23 ENCOUNTER — Ambulatory Visit: Admitting: Podiatry

## 2023-09-23 ENCOUNTER — Encounter (HOSPITAL_COMMUNITY): Payer: Self-pay

## 2023-09-23 ENCOUNTER — Other Ambulatory Visit: Payer: Self-pay

## 2023-09-23 ENCOUNTER — Encounter (HOSPITAL_COMMUNITY)
Admission: RE | Disposition: A | Discharge status: Home / Self Care | Payer: Self-pay | Source: Home / Self Care | Attending: Vascular Surgery

## 2023-09-23 DIAGNOSIS — Z79899 Other long term (current) drug therapy: Secondary | ICD-10-CM | POA: Insufficient documentation

## 2023-09-23 DIAGNOSIS — Z87891 Personal history of nicotine dependence: Secondary | ICD-10-CM | POA: Diagnosis not present

## 2023-09-23 DIAGNOSIS — Z992 Dependence on renal dialysis: Secondary | ICD-10-CM

## 2023-09-23 DIAGNOSIS — E1122 Type 2 diabetes mellitus with diabetic chronic kidney disease: Secondary | ICD-10-CM | POA: Diagnosis not present

## 2023-09-23 DIAGNOSIS — T82858A Stenosis of vascular prosthetic devices, implants and grafts, initial encounter: Secondary | ICD-10-CM | POA: Diagnosis present

## 2023-09-23 DIAGNOSIS — I132 Hypertensive heart and chronic kidney disease with heart failure and with stage 5 chronic kidney disease, or end stage renal disease: Secondary | ICD-10-CM | POA: Insufficient documentation

## 2023-09-23 DIAGNOSIS — Z794 Long term (current) use of insulin: Secondary | ICD-10-CM | POA: Diagnosis not present

## 2023-09-23 DIAGNOSIS — I509 Heart failure, unspecified: Secondary | ICD-10-CM | POA: Diagnosis not present

## 2023-09-23 DIAGNOSIS — Y832 Surgical operation with anastomosis, bypass or graft as the cause of abnormal reaction of the patient, or of later complication, without mention of misadventure at the time of the procedure: Secondary | ICD-10-CM | POA: Diagnosis not present

## 2023-09-23 DIAGNOSIS — N186 End stage renal disease: Secondary | ICD-10-CM

## 2023-09-23 HISTORY — PX: VENOUS STENT: CATH118377

## 2023-09-23 HISTORY — PX: A/V SHUNT INTERVENTION: CATH118220

## 2023-09-23 LAB — GLUCOSE, CAPILLARY: Glucose-Capillary: 136 mg/dL — ABNORMAL HIGH (ref 70–99)

## 2023-09-23 SURGERY — A/V SHUNT INTERVENTION
Anesthesia: LOCAL | Site: Arm Upper | Laterality: Left

## 2023-09-23 SURGERY — A/V SHUNT INTERVENTION
Anesthesia: LOCAL

## 2023-09-23 MED ORDER — HEPARIN (PORCINE) IN NACL 1000-0.9 UT/500ML-% IV SOLN
INTRAVENOUS | Status: DC | PRN
  Administered 2023-09-23: 500 mL

## 2023-09-23 MED ORDER — IODIXANOL 320 MG/ML IV SOLN
INTRAVENOUS | Status: DC | PRN
  Administered 2023-09-23: 20 mL

## 2023-09-23 MED ORDER — LIDOCAINE HCL (PF) 1 % IJ SOLN
INTRAMUSCULAR | Status: DC | PRN
  Administered 2023-09-23: 2 mL via INTRADERMAL

## 2023-09-23 SURGICAL SUPPLY — 10 items
BALLOON MUSTANG 7.0X40 75 (BALLOONS) IMPLANT
KIT ENCORE 26 ADVANTAGE (KITS) IMPLANT
KIT MICROPUNCTURE NIT STIFF (SHEATH) IMPLANT
KIT PV (KITS) ×2 IMPLANT
SHEATH PINNACLE 8F 10CM (SHEATH) IMPLANT
SHEATH PROBE COVER 6X72 (BAG) IMPLANT
STENT VIABAHN 8X50X75 (Permanent Stent) IMPLANT
TRAY PV CATH (CUSTOM PROCEDURE TRAY) ×2 IMPLANT
TUBING CIL FLEX 10 FLL-RA (TUBING) IMPLANT
WIRE BENTSON .035X145CM (WIRE) IMPLANT

## 2023-09-23 NOTE — H&P (Signed)
 H+P    History of Present Illness: This is a 58 y.o. male history of end-stage renal disease on dialysis via left upper arm cephalic vein fistula which has been intervened upon in the past by Dr. Lydia Sams was initially created just over 1 year ago.  He does have some numbness and weakness in the left hand but prefers to keep access in the left upper extremity.  Past Medical History:  Diagnosis Date   Acute exacerbation of CHF (congestive heart failure) (HCC) 12/31/2021   Anxiety    Panic Atacks   Arthritis    Carpal tunnel syndrome    Chronic kidney disease    Constipation    Depression    Diabetes mellitus    DMII   Dysrhythmia    hx tachy hr   GERD (gastroesophageal reflux disease)    no problem in past few years.   Heart disease    Heart murmur    Hepatitis    Hyperlipidemia    Hypertension    Nausea and vomiting 11/12/2022   Neuropathy associated with endocrine disorder (HCC)    Seizures (HCC)    stress related no more since taking Klonopin  2 years- See Neurologist  [facility]   Shortness of breath dyspnea    with exertion   Sleep apnea    used a cpap when he was 350lb-does not need one now 260lb   Wears contact lenses     Past Surgical History:  Procedure Laterality Date   A/V SHUNT INTERVENTION N/A 06/29/2023   Procedure: A/V SHUNT INTERVENTION;  Surgeon: Melodie Spry, MD;  Location[facility] INVASIVE CV LAB;  Service: Cardiovascular;  Laterality: N/A;   AV FISTULA PLACEMENT Left 09/11/2022   Procedure: LEFT ARM BRACHIOCEPHALIC ARTERIOVENOUS (AV) FISTULA CREATION;  Surgeon: Adine Hoof, MD;  Location: [facility] OR;  Service: Vascular;  Laterality: Left;   CATARACT EXTRACTION Bilateral    COLONOSCOPY     KNEE ARTHROSCOPY W/ MENISCAL REPAIR  04/06/1998   left   LUMBAR LAMINECTOMY  04/06/2000   PROSTATE BIOPSY     ROBOT ASSISTED LAPAROSCOPIC RADICAL PROSTATECTOMY N/A 04/15/2023   Procedure: XI ROBOTIC ASSISTED LAPAROSCOPIC RADICAL PROSTATECTOMY LEVEL 2/BILATERAL  PELVIC LYMPHADENECTOMY;  Surgeon: Florencio Hunting, MD;  Location: [facility] ORS;  Service: Urology;  Laterality: N/A;  210 MINUTES NEEDED FOR CASE   VENOUS ANGIOPLASTY Left 06/29/2023   Procedure: VENOUS ANGIOPLASTY;  Surgeon: Melodie Spry, MD;  Location: [facility] INVASIVE CV LAB;  Service: Cardiovascular;  Laterality: Left;  cephalic arch    Allergies  Allergen Reactions   Norvasc  [Amlodipine ] Other (See Comments)    Bilateral leg swelling   Zestril  [Lisinopril ] Swelling and Other (See Comments)    Face and legs swell    Prior to Admission medications   Medication Sig Start Date End Date Taking? Authorizing Provider  atorvastatin  (LIPITOR) 40 MG tablet Take 1 tablet (40 mg total) by mouth daily. 03/22/23   Carleen Chary, DO  bumetanide  (BUMEX ) 2 MG tablet Take 2 tablets (4 mg total) by mouth 2 (two) times daily. Patient taking differently: Take 4 mg by mouth 2 (two) times daily. 09/03/22   Sridharan, Sriramkumar, MD  CALCITRIOL PO Take by mouth. 05/17/23 05/15/24  [provider]  carvedilol  (COREG ) 25 MG tablet Take 25 mg by mouth 2 (two) times daily with a meal.    [provider]  Cinacalcet HCl (SENSIPAR PO) Take by mouth. 06/11/23 06/07/24  [provider]  Continuous Glucose Receiver (FREESTYLE LIBRE 3 READER) DEVI Use  with freestyle libre 3 plus sensors to monitor your blood sugar continuously 06/29/23   Carleen Chary, DO  Continuous Glucose Sensor (FREESTYLE LIBRE 3 PLUS SENSOR) MISC Change sensor every 15 days. 06/29/23   Carleen Chary, DO  doxycycline  (VIBRA -TABS) 100 MG tablet Take 1 tablet (100 mg total) by mouth 2 (two) times daily. Patient not taking: Reported on 07/29/2023 07/15/23   Charity Conch, DPM  DULoxetine  (CYMBALTA ) 20 MG capsule TAKE 1 CAPSULE BY MOUTH DAILY 09/08/23   Carleen Chary, DO  feeding supplement (BOOST HIGH PROTEIN) LIQD Take 1 Container by mouth daily as needed (Supplement).    [provider]  gabapentin  (NEURONTIN )  100 MG capsule Take 1 capsule (100 mg total) by mouth at bedtime. 03/22/23   Carleen Chary, DO  insulin  glargine (LANTUS ) 100 UNIT/ML Solostar Pen Inject 5 Units into the skin daily. 09/13/23   Carleen Chary, DO  Insulin  Pen Needle (PEN NEEDLES) 32G X 4 MM MISC 1 Needle by Does not apply route daily at 6 (six) AM. 02/17/22   Jinwala, Sagar H, MD  iron sucrose (VENOFER) 20 MG/ML injection Iron Sucrose (Venofer) 05/12/23 05/10/24  [provider]  Methoxy PEG-Epoetin Beta (MIRCERA IJ) Mircera 12/16/22 12/15/23  [provider]  prochlorperazine  (COMPAZINE ) 5 MG tablet Take 1 tablet (5 mg total) by mouth every 6 (six) hours as needed for nausea or vomiting. 08/16/23   Carleen Chary, DO  sertraline  (ZOLOFT ) 100 MG tablet TAKE 1 TABLET BY MOUTH DAILY 09/08/23   Carleen Chary, DO  sevelamer carbonate (RENVELA) 800 MG tablet Take 1,600-2,400 mg by mouth 3 (three) times daily with meals. 800 mg -1600 mg with snack    [provider]  silver  sulfADIAZINE  (SILVADENE ) 1 % cream Apply 1 Application topically daily. 07/15/23   Charity Conch, DPM    Social History   Socioeconomic History   Marital status: Single    Spouse name: Not on file   Number of children: Not on file   Years of education: Not on file   Highest education level: Not on file  Occupational History   Not on file  Tobacco Use   Smoking status: Former    Current packs/day: 0.00    Types: Cigarettes    Quit date: 10/27/2008    Years since quitting: 14.9   Smokeless tobacco: Never   Tobacco comments:    Quit x 10 yrs.  Vaping Use   Vaping status: Never Used  Substance and Sexual Activity   Alcohol use: Not Currently   Drug use: Yes    Frequency: 7.0 times per week    Types: Marijuana   Sexual activity: Not Currently    Partners: Male    Birth control/protection: None    Comment: last HIV test years ago.  Other Topics Concern   Not on file  Social History Narrative   Not on file    Social Drivers of Health   Financial Resource Strain: Not on file  Food Insecurity: No Food Insecurity (04/15/2023)   Hunger Vital Sign    Worried About Running Out of Food in the Last Year: Never true    Ran Out of Food in the Last Year: Never true  Transportation Needs: No Transportation Needs (04/15/2023)   PRAPARE - Administrator, Civil Service (Medical): No    Lack of Transportation (Non-Medical): No  Physical Activity: Not on file  Stress: Not on file  Social Connections: Moderately Integrated (02/17/2022)   Social Connection and Isolation  Panel    Frequency of Communication with Friends and Family: More than three times a week    Frequency of Social Gatherings with Friends and Family: More than three times a week    Attends Religious Services: More than 4 times per year    Active Member of [employer] or Organizations: Yes    Attends Banker Meetings: More than 4 times per year    Marital Status: Never married  Intimate Partner Violence: Not At Risk (04/15/2023)   Humiliation, Afraid, Rape, and Kick questionnaire    Fear of Current or Ex-Partner: No    Emotionally Abused: No    Physically Abused: No    Sexually Abused: No    Family History  Problem Relation Age of Onset   Cancer Mother        pancres   Diabetes Mother    Hypertension Brother    Renal cancer Brother    Cancer Maternal Uncle    Colon cancer Maternal Uncle    Colon cancer Maternal Uncle    Stomach cancer Maternal Uncle    Esophageal cancer Neg Hx    Rectal cancer Neg Hx     Review of Systems  Constitutional: Negative.   HENT: Negative.    Respiratory: Negative.    Cardiovascular: Negative.   Gastrointestinal: Negative.   Genitourinary: Negative.   Musculoskeletal: Negative.   Skin: Negative.   Neurological:  Positive for sensory change and weakness.  Psychiatric/Behavioral: Negative.        Physical Examination  Vitals:   09/23/23 0752 09/23/23 0802  BP: 134/76  119/64  Pulse: 75   Resp: 12 14  Temp: 97.8 F (36.6 C)   SpO2: 100% 98%   There is no height or weight on file to calculate BMI.  Physical Exam HENT:     Head: Normocephalic.     Mouth/Throat:     Mouth: Mucous membranes are moist.   Eyes:     Pupils: Pupils are equal, round, and reactive to light.    Cardiovascular:     Rate and Rhythm: Normal rate.  Pulmonary:     Effort: Pulmonary effort is normal.  Abdominal:     General: Abdomen is flat.   Musculoskeletal:        General: Normal range of motion.     Cervical back: Normal range of motion.     Comments: Left upper arm AV fistula with palpable pulsatility   Skin:    Capillary Refill: Capillary refill takes less than 2 seconds.   Neurological:     General: No focal deficit present.     Mental Status: He is alert.   Psychiatric:        Mood and Affect: Mood normal.      CBC    Component Value Date/Time   WBC 6.7 04/08/2023 1115   RBC 3.63 (L) 04/08/2023 1115   HGB 10.5 (L) 06/29/2023 1344   HGB 9.5 (L) 01/08/2022 1501   HCT 31.0 (L) 06/29/2023 1344   HCT 28.6 (L) 01/08/2022 1501   PLT 179 04/08/2023 1115   PLT 131 (L) 01/08/2022 1501   MCV 93.7 04/08/2023 1115   MCV 86 01/08/2022 1501   MCH 31.1 04/08/2023 1115   MCHC 33.2 04/08/2023 1115   RDW 14.9 04/08/2023 1115   RDW 13.2 01/08/2022 1501   LYMPHSABS 1.3 01/01/2022 0011   LYMPHSABS 2.6 05/10/2019 1021   MONOABS 0.5 01/01/2022 0011   EOSABS 0.2 01/01/2022 0011   EOSABS 0.3 05/10/2019  1021   BASOSABS 0.0 01/01/2022 0011   BASOSABS 0.1 05/10/2019 1021    BMET    Component Value Date/Time   NA 138 06/29/2023 1344   NA 139 08/12/2022 1339   K 3.7 06/29/2023 1344   CL 95 (L) 06/29/2023 1344   CO2 27 04/16/2023 0530   GLUCOSE 173 (H) 06/29/2023 1344   BUN 36 (H) 06/29/2023 1344   BUN 64 (H) 08/12/2022 1339   CREATININE 6.20 (H) 06/29/2023 1344   CREATININE 1.31 11/21/2019 1445   CALCIUM  8.4 (L) 04/16/2023 0530   GFRNONAA 9 (L)  04/16/2023 0530   GFRNONAA >89 01/29/2016 0925   GFRAA 101 12/06/2019 1357   GFRAA >89 01/29/2016 0925    COAGS: Lab Results  Component Value Date   INR 1.1 01/08/2022   INR 1.5 (H) 01/04/2022   INR 2.2 (H) 01/01/2022     Non-Invasive Vascular Imaging:   No new studies   ASSESSMENT/PLAN: This is a 58 y.o. male with decreased clearance on dialysis and pulsatility in his fistula.  Plan for fistulogram today in the Cath Lab.  All risk benefits alternatives and possible need for surgical intervention were discussed with the patient he demonstrates good understanding.    Bexley Laubach C. Vikki Graves, MD [facility] Office: [phone] Pager: [phone]

## 2023-09-23 NOTE — Op Note (Signed)
    Patient name: [name] MRN: [id number] DOB: 10-03-1965 Sex: male  09/23/2023 Pre-operative Diagnosis: End-stage renal disease, malfunction left arm AV fistula Post-operative diagnosis:  Same Surgeon:  Sarajane [place]. Vikki Graves, MD Procedure Performed: 1.  Percutaneous ultrasound-guided access left arm AV fistula 2.  Left upper extremity fistulogram 3.  Stent of left cephalic arch with 8 x 50 mm Viabahn postdilated with a 7 mm balloon  Indications: 58 year old male on dialysis via left upper arm AV fistula.  He has had decreased clearance times and alarming while on dialysis and has pulsatility in his fistula is not indicated for fistulogram with possible intervention.  Findings: Fistula itself appears very healthy, I could not appreciate the inflow due to collateralization.  There 90% tandem stenosis at the cephalic arch primarily stented with 8 mm Viabahn postdilated with 7 mm balloon.  At completion there was a much improved thrill in the fistula no residual stenosis in the cephalic arch.  Fistula remains amenable to future percutaneous intervention.   Procedure:  The patient was identified in the holding area and taken to the [facility] procedure room.  The patient was then placed supine on the table and prepped and draped in the usual sterile fashion.  A time out was called.  Ultrasound was used to evaluate the left arm AV fistula.  The arm around the fistula was anesthetized 1% lidocaine  with this was cannulated with a micropuncture needle followed by wire sheath with direct ultrasound visualization and an image was saved to the permanent record.  We performed left upper extremity fistulogram.  With the above findings we placed a Bentson wire followed by an 8 [place] sheath.  We crossed the stenosis into the central venous system and primarily stented with an 8 mm Viabahn and postdilated with 7 mm balloon.  While the balloon was inflated we perform retrograde imaging but unfortunately were not able to  visualize the arteriovenous anastomosis due to heavy collateralization but there was very strong inflow by physical exam.  At completion fistulogram there was no further stenosis at the cephalic margin was a much improved thrill in the fistula.  Satisfied with this we removed the wire and balloon and then suture-ligated the cannulation site and sheath was removed.  The patient tolerated the procedure without any complication.  All counts were correct at completion.  Contrast: 20cc    Jamie-Lee Galdamez C. Vikki Graves, MD [facility] Office: [phone] Pager: [phone]

## 2023-09-24 ENCOUNTER — Encounter (HOSPITAL_COMMUNITY): Payer: Self-pay | Admitting: Vascular Surgery

## 2023-09-30 NOTE — Progress Notes (Signed)
 No scans oop too high

## 2023-10-26 ENCOUNTER — Ambulatory Visit: Admitting: Podiatry

## 2023-10-31 ENCOUNTER — Emergency Department (HOSPITAL_COMMUNITY): Admission: EM | Admit: 2023-10-31 | Discharge: 2023-10-31 | Disposition: A | Discharge status: Home / Self Care

## 2023-10-31 ENCOUNTER — Emergency Department (HOSPITAL_COMMUNITY)

## 2023-10-31 ENCOUNTER — Other Ambulatory Visit: Payer: Self-pay

## 2023-10-31 ENCOUNTER — Encounter (HOSPITAL_COMMUNITY): Payer: Self-pay

## 2023-10-31 DIAGNOSIS — I509 Heart failure, unspecified: Secondary | ICD-10-CM | POA: Diagnosis not present

## 2023-10-31 DIAGNOSIS — R112 Nausea with vomiting, unspecified: Secondary | ICD-10-CM | POA: Insufficient documentation

## 2023-10-31 DIAGNOSIS — I132 Hypertensive heart and chronic kidney disease with heart failure and with stage 5 chronic kidney disease, or end stage renal disease: Secondary | ICD-10-CM | POA: Insufficient documentation

## 2023-10-31 DIAGNOSIS — E871 Hypo-osmolality and hyponatremia: Secondary | ICD-10-CM | POA: Insufficient documentation

## 2023-10-31 DIAGNOSIS — Z79899 Other long term (current) drug therapy: Secondary | ICD-10-CM | POA: Diagnosis not present

## 2023-10-31 DIAGNOSIS — N186 End stage renal disease: Secondary | ICD-10-CM | POA: Insufficient documentation

## 2023-10-31 DIAGNOSIS — Z794 Long term (current) use of insulin: Secondary | ICD-10-CM | POA: Diagnosis not present

## 2023-10-31 DIAGNOSIS — E1122 Type 2 diabetes mellitus with diabetic chronic kidney disease: Secondary | ICD-10-CM | POA: Diagnosis not present

## 2023-10-31 DIAGNOSIS — Z992 Dependence on renal dialysis: Secondary | ICD-10-CM | POA: Insufficient documentation

## 2023-10-31 LAB — CBC WITH DIFFERENTIAL/PLATELET
Abs Immature Granulocytes: 0.05 K/uL (ref 0.00–0.07)
Basophils Absolute: 0 K/uL (ref 0.0–0.1)
Basophils Relative: 0 %
Eosinophils Absolute: 0 K/uL (ref 0.0–0.5)
Eosinophils Relative: 0 %
HCT: 34.8 % — ABNORMAL LOW (ref 39.0–52.0)
Hemoglobin: 12 g/dL — ABNORMAL LOW (ref 13.0–17.0)
Immature Granulocytes: 1 %
Lymphocytes Relative: 29 %
Lymphs Abs: 3 K/uL (ref 0.7–4.0)
MCH: 31.7 pg (ref 26.0–34.0)
MCHC: 34.5 g/dL (ref 30.0–36.0)
MCV: 91.8 fL (ref 80.0–100.0)
Monocytes Absolute: 0.7 K/uL (ref 0.1–1.0)
Monocytes Relative: 7 %
Neutro Abs: 6.6 K/uL (ref 1.7–7.7)
Neutrophils Relative %: 63 %
Platelets: 207 K/uL (ref 150–400)
RBC: 3.79 MIL/uL — ABNORMAL LOW (ref 4.22–5.81)
RDW: 13.1 % (ref 11.5–15.5)
WBC: 10.4 K/uL (ref 4.0–10.5)
nRBC: 0 % (ref 0.0–0.2)

## 2023-10-31 LAB — COMPREHENSIVE METABOLIC PANEL WITH GFR
ALT: 13 U/L (ref 0–44)
AST: 20 U/L (ref 15–41)
Albumin: 3.6 g/dL (ref 3.5–5.0)
Alkaline Phosphatase: 55 U/L (ref 38–126)
Anion gap: 21 — ABNORMAL HIGH (ref 5–15)
BUN: 62 mg/dL — ABNORMAL HIGH (ref 6–20)
CO2: 26 mmol/L (ref 22–32)
Calcium: 10 mg/dL (ref 8.9–10.3)
Chloride: 81 mmol/L — ABNORMAL LOW (ref 98–111)
Creatinine, Ser: 11.9 mg/dL — ABNORMAL HIGH (ref 0.61–1.24)
GFR, Estimated: 5 mL/min — ABNORMAL LOW (ref >60)
Glucose, Bld: 261 mg/dL — ABNORMAL HIGH (ref 70–99)
Potassium: 3.7 mmol/L (ref 3.5–5.1)
Sodium: 128 mmol/L — ABNORMAL LOW (ref 135–145)
Total Bilirubin: 0.9 mg/dL (ref 0.0–1.2)
Total Protein: 8.2 g/dL — ABNORMAL HIGH (ref 6.5–8.1)

## 2023-10-31 LAB — LIPASE, BLOOD: Lipase: 106 U/L — ABNORMAL HIGH (ref 11–51)

## 2023-10-31 LAB — TROPONIN I (HIGH SENSITIVITY)
Troponin I (High Sensitivity): 68 ng/L — ABNORMAL HIGH (ref <18)
Troponin I (High Sensitivity): 74 ng/L — ABNORMAL HIGH (ref <18)

## 2023-10-31 MED ORDER — ALUM & MAG HYDROXIDE-SIMETH 200-200-20 MG/5ML PO SUSP
30.0000 mL | Freq: Once | ORAL | Status: AC
  Administered 2023-10-31: 30 mL via ORAL
  Filled 2023-10-31: qty 30

## 2023-10-31 MED ORDER — PROCHLORPERAZINE EDISYLATE 10 MG/2ML IJ SOLN
10.0000 mg | Freq: Once | INTRAMUSCULAR | Status: AC
  Administered 2023-10-31: 10 mg via INTRAVENOUS
  Filled 2023-10-31: qty 2

## 2023-10-31 MED ORDER — DIPHENHYDRAMINE HCL 50 MG/ML IJ SOLN
12.5000 mg | Freq: Once | INTRAMUSCULAR | Status: AC
  Administered 2023-10-31: 12.5 mg via INTRAVENOUS
  Filled 2023-10-31: qty 1

## 2023-10-31 MED ORDER — FENTANYL CITRATE PF 50 MCG/ML IJ SOSY
50.0000 ug | PREFILLED_SYRINGE | Freq: Once | INTRAMUSCULAR | Status: AC
  Administered 2023-10-31: 50 ug via INTRAVENOUS
  Filled 2023-10-31: qty 1

## 2023-10-31 MED ORDER — LABETALOL HCL 5 MG/ML IV SOLN
10.0000 mg | Freq: Once | INTRAVENOUS | Status: AC
  Administered 2023-10-31: 10 mg via INTRAVENOUS
  Filled 2023-10-31: qty 4

## 2023-10-31 MED ORDER — PANTOPRAZOLE SODIUM 20 MG PO TBEC: 20.0000 mg | DELAYED_RELEASE_TABLET | Freq: Every day | ORAL | 0 refills | Status: DC

## 2023-10-31 NOTE — ED Triage Notes (Signed)
 Pt BIB [facility] from home d/t generalized weakness/ feeling sick for the past 4 days. Pt stated he has been feeling nauseous and every time when he tries to drink something , he vomits. Pt is on dialysis , missed last treatment because he was feeling sick. A&O X4. VSS.

## 2023-10-31 NOTE — Discharge Instructions (Addendum)
 Your workup today was reassuring overall.  Please take the Protonix  for the esophageal irritation.  Please continue the Compazine  at home for nausea.  Please call and schedule a follow-up appointment with the GI doctors at the number provided to schedule a follow-up appointment as they may want to perform an EGD which is a scope to look at your esophagus.  Please return to the emergency department for worsening symptoms.

## 2023-10-31 NOTE — ED Provider Notes (Signed)
 [place] EMERGENCY DEPARTMENT AT [facility] Provider Note   CSN: [id number] Arrival date & time: 10/31/23  1151     Patient presents with: Weakness   [name] is a 58 y.o. male.   58 year old male with past medical history of end-stage renal disease on dialysis Mondays, Wednesdays, and Fridays as well as hypertension, CHF, and diabetes presenting to the emergency department today feeling generally unwell over the past few days.  The patient states that he started with nausea and vomiting.  Reports that he has had multiple episodes of nonbloody, nonbilious emesis over the past 4 days.  He reports that he missed dialysis on Friday because he was feeling unwell.  He reports that his vomit has been nonbloody and nonbilious.  He states that after vomiting multiple times that he has a gradual onset of worsening epigastric and upper abdominal pain.  The patient does report that he has had a few loose stools.  He states he is having a burning pain in his chest as well.  This is gradually worsening during that time as well.  He reports that he has had some chills.  He came to the emergency department today for further evaluation regarding this due to these ongoing symptoms.  Is not checked his temperature at home.   Weakness Associated symptoms: abdominal pain, chest pain, nausea and vomiting        Prior to Admission medications   Medication Sig Start Date End Date Taking? Authorizing Provider  pantoprazole  (PROTONIX ) 20 MG tablet Take 1 tablet (20 mg total) by mouth daily. 10/31/23  Yes Ula Prentice SAUNDERS, MD  atorvastatin  (LIPITOR) 40 MG tablet Take 1 tablet (40 mg total) by mouth daily. 03/22/23   Harrie Bruckner, DO  bumetanide  (BUMEX ) 2 MG tablet Take 2 tablets (4 mg total) by mouth 2 (two) times daily. Patient taking differently: Take 4 mg by mouth 2 (two) times daily. 09/03/22   Sridharan, Sriramkumar, MD  CALCITRIOL PO Take by mouth. 05/17/23 05/15/24  [provider]  carvedilol  (COREG ) 25 MG tablet Take 25 mg by mouth 2 (two) times daily with a meal.    [provider]  Cinacalcet HCl (SENSIPAR PO) Take by mouth. 06/11/23 06/07/24  [provider]  Continuous Glucose Receiver (FREESTYLE LIBRE 3 READER) DEVI Use with freestyle libre 3 plus sensors to monitor your blood sugar continuously 06/29/23   Harrie Bruckner, DO  Continuous Glucose Sensor (FREESTYLE LIBRE 3 PLUS SENSOR) MISC Change sensor every 15 days. 06/29/23   Harrie Bruckner, DO  doxycycline  (VIBRA -TABS) 100 MG tablet Take 1 tablet (100 mg total) by mouth 2 (two) times daily. Patient not taking: Reported on 07/29/2023 07/15/23   Gershon Donnice SAUNDERS, DPM  DULoxetine  (CYMBALTA ) 20 MG capsule TAKE 1 CAPSULE BY MOUTH DAILY 09/08/23   Harrie Bruckner, DO  feeding supplement (BOOST HIGH PROTEIN) LIQD Take 1 Container by mouth daily as needed (Supplement).    [provider]  gabapentin  (NEURONTIN ) 100 MG capsule Take 1 capsule (100 mg total) by mouth at bedtime. 03/22/23   Harrie Bruckner, DO  insulin  glargine (LANTUS ) 100 UNIT/ML Solostar Pen Inject 5 Units into the skin daily. 09/13/23   Harrie Bruckner, DO  Insulin  Pen Needle (PEN NEEDLES) 32G X 4 MM MISC 1 Needle by Does not apply route daily at 6 (six) AM. 02/17/22   Jinwala, Sagar H, MD  iron sucrose (VENOFER) 20 MG/ML injection Iron Sucrose (Venofer) 05/12/23 05/10/24  [provider]  Methoxy PEG-Epoetin  Beta (MIRCERA IJ) Mircera 12/16/22 12/15/23  [provider]  prochlorperazine  (COMPAZINE ) 5 MG tablet Take 1 tablet (5 mg total) by mouth every 6 (six) hours as needed for nausea or vomiting. 08/16/23   Harrie Bruckner, DO  sertraline  (ZOLOFT ) 100 MG tablet TAKE 1 TABLET BY MOUTH DAILY 09/08/23   Harrie Bruckner, DO  sevelamer carbonate (RENVELA) 800 MG tablet Take 1,600-2,400 mg by mouth 3 (three) times daily with meals. 800 mg -1600 mg with snack    [provider]  silver   sulfADIAZINE  (SILVADENE ) 1 % cream Apply 1 Application topically daily. 07/15/23   Gershon Donnice SAUNDERS, DPM    Allergies: Norvasc  [amlodipine ] and Zestril  [lisinopril ]    Review of Systems  Cardiovascular:  Positive for chest pain.  Gastrointestinal:  Positive for abdominal pain, nausea and vomiting.  Neurological:  Positive for weakness.  All other systems reviewed and are negative.   Updated Vital Signs BP (!) 149/88   Pulse 88   Temp 98.4 F (36.9 C) (Oral)   Resp 13   SpO2 100%   Physical Exam Vitals and nursing note reviewed.   Gen: NAD, diaphoretic Eyes: PERRL, EOMI HEENT: no oropharyngeal swelling Neck: trachea midline Resp: clear to auscultation bilaterally Card: RRR, no murmurs, rubs, or gallops Abd: Patient has tenderness over the epigastrium and periumbilical region, no pulsatile mass noted Extremities: no calf tenderness, no edema Vascular: 2+ radial pulses bilaterally, 2+ DP pulses bilaterally Neuro: Cranial nerves intact, equal strength and sensation throughout bilateral upper and lower extremities Skin: no rashes Psyc: acting appropriately    (all labs ordered are listed, but only abnormal results are displayed) Labs Reviewed  CBC WITH DIFFERENTIAL/PLATELET - Abnormal; Notable for the following components:      Result Value   RBC 3.79 (*)    Hemoglobin 12.0 (*)    HCT 34.8 (*)    All other components within normal limits  COMPREHENSIVE METABOLIC PANEL WITH GFR - Abnormal; Notable for the following components:   Sodium 128 (*)    Chloride 81 (*)    Glucose, Bld 261 (*)    BUN 62 (*)    Creatinine, Ser 11.90 (*)    Total Protein 8.2 (*)    GFR, Estimated 5 (*)    Anion gap 21 (*)    All other components within normal limits  LIPASE, BLOOD - Abnormal; Notable for the following components:   Lipase 106 (*)    All other components within normal limits  TROPONIN I (HIGH SENSITIVITY) - Abnormal; Notable for the following components:   Troponin I (High  Sensitivity) 74 (*)    All other components within normal limits  URINALYSIS, ROUTINE W REFLEX MICROSCOPIC  TROPONIN I (HIGH SENSITIVITY)    EKG: EKG Interpretation Date/Time:  Sunday October 31 2023 12:04:17 EDT Ventricular Rate:  101 PR Interval:  159 QRS Duration:  92 QT Interval:  384 QTC Calculation: 498 R Axis:   79  Text Interpretation: Sinus tachycardia Probable LVH with secondary repol abnrm Borderline prolonged QT interval Baseline wander in lead(s) V1 Confirmed by Ula Barter [phone]) on 10/31/2023 1:24:33 PM  Radiology: CT ABDOMEN PELVIS WO CONTRAST Result Date: 10/31/2023 CLINICAL DATA:  Abdominal pain. EXAM: CT ABDOMEN AND PELVIS WITHOUT CONTRAST TECHNIQUE: Multidetector CT imaging of the abdomen and pelvis was performed following the standard protocol without IV contrast. RADIATION DOSE REDUCTION: This exam was performed according to the departmental dose-optimization program which includes automated exposure control, adjustment of the mA and/or kV according to patient  size and/or use of iterative reconstruction technique. COMPARISON:  10/26/2022 FINDINGS: Lower chest: Coronary atherosclerosis. Mitral valve calcification. Suspected small type 1 hiatal hernia with potential wall thickening of the distal esophagus which is nonspecific. Accentuated density along the right posterior esophageal margin of the mediastinum measuring 2.2 by 0.9 cm, potentially from adjacent lymph node or inflammatory fluid. Hepatobiliary: Mildly contracted gallbladder. Otherwise unremarkable. Pancreas: Unremarkable Spleen: Unremarkable Adrenals/Urinary Tract: Unremarkable Stomach/Bowel: Scattered proximal sigmoid colon diverticula. Vascular/Lymphatic: Atherosclerosis is present, including aortoiliac atherosclerotic disease. Reproductive: Prostatectomy. Other: No supplemental non-categorized findings. Musculoskeletal: Severe inflate sclerosis and degenerative endplate findings at L3-4, L4-5, L5-S1 as on prior  exams, this suspected foraminal impingement bilaterally L4-5 and potentially at L5-S1. Left posterolateral rod and pedicle screw fixation the IMPRESSION: 1. Suspected small type 1 hiatal hernia with potential wall thickening of the distal esophagus which is nonspecific. Accentuated density along the right posterior esophageal margin of the mediastinum measuring 2.2 by 0.9 cm, potentially from adjacent lymph node or inflammatory fluid. Esophagitis would be a common cause for esophageal wall thickening, although other etiologies are not excluded. 2. Coronary atherosclerosis. 3. Mitral valve calcification. 4. Scattered proximal sigmoid colon diverticula. 5. Prostatectomy. 6. Severe lumbar spondylosis with suspected foraminal impingement bilaterally at L4-5 and potentially at L5-S1. 7.  Aortic Atherosclerosis (ICD10-I70.0). Electronically Signed   By: Ryan Salvage M.D.   On: 10/31/2023 13:52   DG Chest Portable 1 View Result Date: 10/31/2023 CLINICAL DATA:  Missed hemodialysis. EXAM: PORTABLE CHEST 1 VIEW COMPARISON:  12/31/2021 FINDINGS: The lungs are clear without focal pneumonia, edema, pneumothorax or pleural effusion. The cardiopericardial silhouette is within normal limits for size. No acute bony abnormality. Vascular stent device in the left subclavian region is new in the interval. Telemetry leads overlie the chest. IMPRESSION: No active disease. Electronically Signed   By: Camellia Candle M.D.   On: 10/31/2023 12:54     Procedures   Medications Ordered in the ED  prochlorperazine  (COMPAZINE ) injection 10 mg (10 mg Intravenous Given 10/31/23 1231)  diphenhydrAMINE  (BENADRYL ) injection 12.5 mg (12.5 mg Intravenous Given 10/31/23 1232)  alum & mag hydroxide-simeth (MAALOX/MYLANTA) 200-200-20 MG/5ML suspension 30 mL (30 mLs Oral Given 10/31/23 1231)  labetalol  (NORMODYNE ) injection 10 mg (10 mg Intravenous Given 10/31/23 1244)  fentaNYL  (SUBLIMAZE ) injection 50 mcg (50 mcg Intravenous Given 10/31/23  1408)                                    Medical Decision Making 58 year old male with past medical history of end-stage renal disease, hypertension, and hyperlipidemia presenting to the emergency department today with abdominal pain, chest pain, nausea, and vomiting.  Based on description of his symptoms and duration sounds like this has come on gradually.  Disposition for aortic dissection is low at this time given the patient's reassuring neurovascular exam.  I will further evaluate him here with basic labs well as an EKG, chest x-ray, and troponin to evaluate for pulmonary edema, pulmonary infiltrates, pneumothorax, or ACS.  Will also obtain LFTs and a lipase to evaluate for hepatobiliary pathology or pancreatitis as well as a CT scan of his abdomen to evaluate for colitis, obstruction, diverticulitis, ureterolithiasis, or other intra-abdominal pathology.  I will give the patient a GI cocktail here as well as labetalol .  Will give him some pain and nausea medication and reevaluate for ultimate disposition.  The patient's workup here is reassuring overall.  His EKG has some  nonspecific changes and initial troponin was 79.  Looking back through the patient's most recent troponins his baseline troponin is in the 70s and 80s back in 2023.  The patient CT scan shows hiatal hernia and some possible esophagitis.  On reassessment the patient is feeling much better and tolerating p.o.  He will be started on a PPI and will be discharged with GI follow-up.  His sodium is a little low at 128.  His potassium was within normal limits.  He is scheduled for dialysis tomorrow.  He is comfortable with this.  He is discharged with return precautions.  Amount and/or Complexity of Data Reviewed Labs: ordered. Radiology: ordered.  Risk OTC drugs. Prescription drug management.        Final diagnoses:  Nausea and vomiting, unspecified vomiting type    ED Discharge Orders          Ordered    pantoprazole   (PROTONIX ) 20 MG tablet  Daily        10/31/23 1506               Ula Prentice SAUNDERS, MD 10/31/23 [phone]

## 2023-11-02 ENCOUNTER — Encounter: Payer: Self-pay | Admitting: Internal Medicine

## 2023-11-08 DIAGNOSIS — N2581 Secondary hyperparathyroidism of renal origin: Secondary | ICD-10-CM | POA: Diagnosis not present

## 2023-11-08 DIAGNOSIS — Z992 Dependence on renal dialysis: Secondary | ICD-10-CM | POA: Diagnosis not present

## 2023-11-08 DIAGNOSIS — D688 Other specified coagulation defects: Secondary | ICD-10-CM | POA: Diagnosis not present

## 2023-11-17 DIAGNOSIS — Z992 Dependence on renal dialysis: Secondary | ICD-10-CM | POA: Diagnosis not present

## 2023-11-17 DIAGNOSIS — N2581 Secondary hyperparathyroidism of renal origin: Secondary | ICD-10-CM | POA: Diagnosis not present

## 2023-11-17 DIAGNOSIS — D688 Other specified coagulation defects: Secondary | ICD-10-CM | POA: Diagnosis not present

## 2023-11-17 DIAGNOSIS — N186 End stage renal disease: Secondary | ICD-10-CM | POA: Diagnosis not present

## 2023-11-26 DIAGNOSIS — Z992 Dependence on renal dialysis: Secondary | ICD-10-CM | POA: Diagnosis not present

## 2023-11-26 DIAGNOSIS — D688 Other specified coagulation defects: Secondary | ICD-10-CM | POA: Diagnosis not present

## 2023-11-26 DIAGNOSIS — N2581 Secondary hyperparathyroidism of renal origin: Secondary | ICD-10-CM | POA: Diagnosis not present

## 2023-11-30 ENCOUNTER — Other Ambulatory Visit: Payer: Self-pay | Admitting: Student

## 2023-11-30 DIAGNOSIS — E785 Hyperlipidemia, unspecified: Secondary | ICD-10-CM

## 2023-11-30 NOTE — Telephone Encounter (Signed)
 Medication sent to pharmacy

## 2023-12-01 MED ORDER — PANTOPRAZOLE SODIUM 20 MG PO TBEC: 20.0000 mg | DELAYED_RELEASE_TABLET | Freq: Every day | ORAL | 0 refills | Status: DC

## 2023-12-02 ENCOUNTER — Ambulatory Visit (INDEPENDENT_AMBULATORY_CARE_PROVIDER_SITE_OTHER): Admitting: Student

## 2023-12-02 VITALS — BP 134/80 | HR 97 | Temp 98.3°F | Ht 71.0 in | Wt 201.4 lb

## 2023-12-02 DIAGNOSIS — Z794 Long term (current) use of insulin: Secondary | ICD-10-CM

## 2023-12-02 DIAGNOSIS — M47816 Spondylosis without myelopathy or radiculopathy, lumbar region: Secondary | ICD-10-CM

## 2023-12-02 DIAGNOSIS — K5730 Diverticulosis of large intestine without perforation or abscess without bleeding: Secondary | ICD-10-CM | POA: Diagnosis not present

## 2023-12-02 DIAGNOSIS — N186 End stage renal disease: Secondary | ICD-10-CM | POA: Diagnosis not present

## 2023-12-02 DIAGNOSIS — I5032 Chronic diastolic (congestive) heart failure: Secondary | ICD-10-CM

## 2023-12-02 DIAGNOSIS — E1122 Type 2 diabetes mellitus with diabetic chronic kidney disease: Secondary | ICD-10-CM | POA: Diagnosis not present

## 2023-12-02 DIAGNOSIS — K209 Esophagitis, unspecified without bleeding: Secondary | ICD-10-CM | POA: Diagnosis not present

## 2023-12-02 DIAGNOSIS — Z992 Dependence on renal dialysis: Secondary | ICD-10-CM | POA: Diagnosis not present

## 2023-12-02 DIAGNOSIS — R935 Abnormal findings on diagnostic imaging of other abdominal regions, including retroperitoneum: Secondary | ICD-10-CM | POA: Diagnosis not present

## 2023-12-02 DIAGNOSIS — I700 Atherosclerosis of aorta: Secondary | ICD-10-CM

## 2023-12-02 DIAGNOSIS — E119 Type 2 diabetes mellitus without complications: Secondary | ICD-10-CM

## 2023-12-02 LAB — POCT GLYCOSYLATED HEMOGLOBIN (HGB A1C): HbA1c, POC (controlled diabetic range): 7.7 % — AB (ref 0.0–7.0)

## 2023-12-02 LAB — GLUCOSE, CAPILLARY: Glucose-Capillary: 177 mg/dL — ABNORMAL HIGH (ref 70–99)

## 2023-12-02 MED ORDER — INSULIN GLARGINE 100 UNIT/ML SOLOSTAR PEN
20.0000 [IU] | PEN_INJECTOR | Freq: Every day | SUBCUTANEOUS | 1 refills | Status: DC
Start: 2023-12-02 — End: 2024-02-22

## 2023-12-02 MED ORDER — PANTOPRAZOLE SODIUM 20 MG PO TBEC: 40.0000 mg | DELAYED_RELEASE_TABLET | Freq: Every day | ORAL | 11 refills | Status: DC

## 2023-12-02 NOTE — Patient Instructions (Addendum)
 Thank you, [name] for allowing us  to provide your care today. Today we discussed   Diabetes: - Please increase your frequency of Lantus  to 20 units daily  (as opposed to the 4 days per week you were doing before) - Monitor for signs of hypoglecemia symptoms and the numbers on your glucose monitor.   - I also sent a referral to the eye doctor  - For the inflammation in your esophagus, I will increase the dose of your Protonix  to 40 mg daily. Take it on an empty stomach - Follow up with the gastroenterologist next month  - For your blood pressure: monitor your blood pressure after dialysis. Ask your dialysis doctor/providers if you should change how you take the medications   I have ordered the following labs for you:   Lab Orders         Glucose, capillary         POC Hbg A1C      I will call if any are abnormal. All of your labs can be accessed through My Chart.   My Chart Access: [email or web address]  Please follow-up in: 3 months for diabetes and to follow up after your consult with the gastroenterologist    We look forward to seeing you next time. Please call our clinic at [phone] if you have any questions or concerns. The best time to call is Monday-Friday from 9am-4pm, but there is someone available 24/7. If after hours or the weekend, call the main hospital number and ask for the Internal Medicine Resident On-Call. If you need medication refills, please notify your pharmacy one week in advance and they will send us  a request.   Thank you for letting us  take part in your care. Wishing you the best!  Joseph Ip, MD 12/02/2023, 2:47 PM [name]

## 2023-12-02 NOTE — Progress Notes (Signed)
 Subjective:  CC: Diabetes follow up  HPI:  [name] is a 58 y.o. male with a past medical history stated below and presents today for Diabetes follow up. Please see problem based assessment and plan for additional details.  Past Medical History:  Diagnosis Date   Acute exacerbation of CHF (congestive heart failure) (HCC) 12/31/2021   Anxiety    Panic Atacks   Arthritis    Carpal tunnel syndrome    Chronic kidney disease    Constipation    Depression    Diabetes mellitus    DMII   Dysrhythmia    hx tachy hr   GERD (gastroesophageal reflux disease)    no problem in past few years.   Heart disease    Heart murmur    Hepatitis    Hyperlipidemia    Hypertension    Nausea and vomiting 11/12/2022   Neuropathy associated with endocrine disorder (HCC)    Seizures (HCC)    stress related no more since taking Klonopin  2 years- See Neurologist  [facility]   Shortness of breath dyspnea    with exertion   Sleep apnea    used a cpap when he was 350lb-does not need one now 260lb   Wears contact lenses     Current Outpatient Medications on File Prior to Visit  Medication Sig Dispense Refill   atorvastatin  (LIPITOR) 40 MG tablet Take 1 tablet (40 mg total) by mouth daily. 90 tablet 2   bumetanide  (BUMEX ) 2 MG tablet Take 2 tablets (4 mg total) by mouth 2 (two) times daily. (Patient taking differently: Take 4 mg by mouth 2 (two) times daily.) 120 tablet 0   CALCITRIOL PO Take by mouth.     carvedilol  (COREG ) 25 MG tablet Take 25 mg by mouth 2 (two) times daily with a meal.     Cinacalcet HCl (SENSIPAR PO) Take by mouth.     Continuous Glucose Receiver (FREESTYLE LIBRE 3 READER) DEVI Use with freestyle libre 3 plus sensors to monitor your blood sugar continuously 1 each 1   Continuous Glucose Sensor (FREESTYLE LIBRE 3 PLUS SENSOR) MISC Change sensor every 15 days. 6 each 3   doxycycline  (VIBRA -TABS) 100 MG tablet Take 1 tablet (100 mg total) by mouth 2 (two) times  daily. (Patient not taking: Reported on 07/29/2023) 20 tablet 0   DULoxetine  (CYMBALTA ) 20 MG capsule TAKE 1 CAPSULE BY MOUTH DAILY 30 capsule 1   feeding supplement (BOOST HIGH PROTEIN) LIQD Take 1 Container by mouth daily as needed (Supplement).     gabapentin  (NEURONTIN ) 100 MG capsule Take 1 capsule (100 mg total) by mouth at bedtime. 90 capsule 3   Insulin  Pen Needle (PEN NEEDLES) 32G X 4 MM MISC 1 Needle by Does not apply route daily at 6 (six) AM. 200 each 1   iron sucrose (VENOFER) 20 MG/ML injection Iron Sucrose (Venofer)     Methoxy PEG-Epoetin Beta (MIRCERA IJ) Mircera     prochlorperazine  (COMPAZINE ) 5 MG tablet Take 1 tablet (5 mg total) by mouth every 6 (six) hours as needed for nausea or vomiting. 30 tablet 6   sertraline  (ZOLOFT ) 100 MG tablet TAKE 1 TABLET BY MOUTH DAILY 30 tablet 0   sevelamer carbonate (RENVELA) 800 MG tablet Take 1,600-2,400 mg by mouth 3 (three) times daily with meals. 800 mg -1600 mg with snack     silver  sulfADIAZINE  (SILVADENE ) 1 % cream Apply 1 Application topically daily. 50 g 0   No current facility-administered medications  on file prior to visit.    Family History  Problem Relation Age of Onset   Cancer Mother        pancres   Diabetes Mother    Hypertension Brother    Renal cancer Brother    Cancer Maternal Uncle    Colon cancer Maternal Uncle    Colon cancer Maternal Uncle    Stomach cancer Maternal Uncle    Esophageal cancer Neg Hx    Rectal cancer Neg Hx     Social History   Socioeconomic History   Marital status: Single    Spouse name: Not on file   Number of children: Not on file   Years of education: Not on file   Highest education level: Not on file  Occupational History   Not on file  Tobacco Use   Smoking status: Former    Current packs/day: 0.00    Types: Cigarettes    Quit date: 10/27/2008    Years since quitting: 15.1   Smokeless tobacco: Never   Tobacco comments:    Quit x 10 yrs.  Vaping Use   Vaping status:  Never Used  Substance and Sexual Activity   Alcohol use: Not Currently   Drug use: Yes    Frequency: 7.0 times per week    Types: Marijuana   Sexual activity: Not Currently    Partners: Male    Birth control/protection: None    Comment: last HIV test years ago.  Other Topics Concern   Not on file  Social History Narrative   Not on file   Social Drivers of Health   Financial Resource Strain: Not on file  Food Insecurity: No Food Insecurity (04/15/2023)   Hunger Vital Sign    Worried About Running Out of Food in the Last Year: Never true    Ran Out of Food in the Last Year: Never true  Transportation Needs: No Transportation Needs (04/15/2023)   PRAPARE - Administrator, Civil Service (Medical): No    Lack of Transportation (Non-Medical): No  Physical Activity: Not on file  Stress: Not on file  Social Connections: Moderately Integrated (02/17/2022)   Social Connection and Isolation Panel    Frequency of Communication with Friends and Family: More than three times a week    Frequency of Social Gatherings with Friends and Family: More than three times a week    Attends Religious Services: More than 4 times per year    Active Member of [employer] or Organizations: Yes    Attends Banker Meetings: More than 4 times per year    Marital Status: Never married  Intimate Partner Violence: Not At Risk (04/15/2023)   Humiliation, Afraid, Rape, and Kick questionnaire    Fear of Current or Ex-Partner: No    Emotionally Abused: No    Physically Abused: No    Sexually Abused: No    Review of Systems: ROS negative except for what is noted on the assessment and plan.  Objective:   Vitals:   12/02/23 1354  BP: 134/80  Pulse: 97  Temp: 98.3 F (36.8 C)  TempSrc: Oral  SpO2: 97%  Weight: 201 lb 6.4 oz (91.4 kg)  Height: 5' 11 (1.803 m)    Physical Exam: Constitutional: well-appearing man sitting in chair, in no acute distress HENT: normocephalic atraumatic,  mucous membranes moist, non-erythematous posterior oropharynx Eyes: conjunctiva non-erythematous Neck: supple Cardiovascular: regular rate and rhythm, no m/r/g, LUE fistula with bruit and thrill Pulmonary/Chest: normal work  of breathing on room air, lungs clear to auscultation bilaterally Abdominal: soft, non-tender, non-distended MSK: normal bulk and tone Neurological: alert & oriented x 3,  Skin: warm and dry Psych: Pleasant mood and affect    Assessment & Plan:   Type 2 diabetes mellitus (HCC) Lab Results  Component Value Date   HGBA1C 7.7 (A) 12/02/2023   HGBA1C 6.5 (H) 04/08/2023   HGBA1C 5.8 (A) 11/11/2022    Increase in A1c today vs January of this year. Of note, patient has been doing Lantus  20 units ~4x per week since then. The main change has been in his ability to tolerate food, supported by increase in Lbs. His food intake was limited by esophagitis/dysphagia, which is still present but improved from prior.  - Continue Atorvastatin  - Increase frequency of Lantus  20U daily - Monitor cBG on CGM - Referral to ophthalmology - Currently of dialysis   ESRD (end stage renal disease) on dialysis [facility]) Doing well. MWF schedule. Mild hypotension after dialysis, corrected with some fluid administration. Fistula is functional  Abnormal CT of the abdomen with potential esophagitis and mediastinal density Improvement in unintentionall weight loss. Accentuated density along the right posterior esophageal margin of the mediastinum measuring 2.2 by 0.9 cm, potentially from adjacent lymph node or inflammatory fluid, likely esophagitis.  - Follow up GI visit and EGD with [place] in the upcoming months  Esophagitis Continues to have dyspepsia, reflux, nausea, and intermittent vomiting. 10/2023 went to ER for mild hematemesis. CT abdomen and pelvis with findings suspicious for esophagitis (wall thickening) and mediastinal changes. Patient is a never smoker of cigarettes, THC products  daily. Has had weight loss but has improved recently. Will see Dr. Abran on 12/28/2023, reinforce need for this - Continue PPI - Follow up with GI - Precautionary symptoms to prompt follow up reviewed  (HFpEF) heart failure with preserved ejection fraction (HCC) Euvolemic today. No Chest pain, shortness of breath at rest of exertion.   Sigmoid diverticulosis No melena or hematochezia    Return in about 3 months (around 03/03/2024) for Dysphagia, GERD, Diabetes.  Patient discussed with Dr. Karna Hadassah Kristy Rosario, MD [facility]

## 2023-12-03 DIAGNOSIS — D688 Other specified coagulation defects: Secondary | ICD-10-CM | POA: Diagnosis not present

## 2023-12-05 DIAGNOSIS — K5730 Diverticulosis of large intestine without perforation or abscess without bleeding: Secondary | ICD-10-CM | POA: Insufficient documentation

## 2023-12-05 DIAGNOSIS — I700 Atherosclerosis of aorta: Secondary | ICD-10-CM | POA: Insufficient documentation

## 2023-12-05 DIAGNOSIS — Z992 Dependence on renal dialysis: Secondary | ICD-10-CM | POA: Diagnosis not present

## 2023-12-05 DIAGNOSIS — N186 End stage renal disease: Secondary | ICD-10-CM | POA: Diagnosis not present

## 2023-12-05 DIAGNOSIS — E1122 Type 2 diabetes mellitus with diabetic chronic kidney disease: Secondary | ICD-10-CM | POA: Diagnosis not present

## 2023-12-05 DIAGNOSIS — K209 Esophagitis, unspecified without bleeding: Secondary | ICD-10-CM | POA: Insufficient documentation

## 2023-12-05 DIAGNOSIS — R935 Abnormal findings on diagnostic imaging of other abdominal regions, including retroperitoneum: Secondary | ICD-10-CM | POA: Insufficient documentation

## 2023-12-05 NOTE — Assessment & Plan Note (Signed)
 Doing well. MWF schedule. Mild hypotension after dialysis, corrected with some fluid administration. Fistula is functional

## 2023-12-05 NOTE — Assessment & Plan Note (Signed)
 Continues to have dyspepsia, reflux, nausea, and intermittent vomiting. 10/2023 went to ER for mild hematemesis. CT abdomen and pelvis with findings suspicious for esophagitis (wall thickening) and mediastinal changes. Patient is a never smoker of cigarettes, THC products daily. Has had weight loss but has improved recently. Will see Dr. Abran on 12/28/2023, reinforce need for this - Continue PPI - Follow up with GI - Precautionary symptoms to prompt follow up reviewed

## 2023-12-05 NOTE — Assessment & Plan Note (Signed)
 No melena or hematochezia

## 2023-12-05 NOTE — Assessment & Plan Note (Signed)
 Improvement in unintentionall weight loss. Accentuated density along the right posterior esophageal margin of the mediastinum measuring 2.2 by 0.9 cm, potentially from adjacent lymph node or inflammatory fluid, likely esophagitis.  - Follow up GI visit and EGD with [facility] in the upcoming months

## 2023-12-05 NOTE — Assessment & Plan Note (Signed)
 Euvolemic today. No Chest pain, shortness of breath at rest of exertion.

## 2023-12-05 NOTE — Assessment & Plan Note (Signed)
 Lab Results  Component Value Date   HGBA1C 7.7 (A) 12/02/2023   HGBA1C 6.5 (H) 04/08/2023   HGBA1C 5.8 (A) 11/11/2022    Increase in A1c today vs January of this year. Of note, patient has been doing Lantus  20 units ~4x per week since then. The main change has been in his ability to tolerate food, supported by increase in Lbs. His food intake was limited by esophagitis/dysphagia, which is still present but improved from prior.  - Continue Atorvastatin  - Increase frequency of Lantus  20U daily - Monitor cBG on CGM - Referral to ophthalmology - Currently of dialysis

## 2023-12-06 DIAGNOSIS — N186 End stage renal disease: Secondary | ICD-10-CM | POA: Diagnosis not present

## 2023-12-06 DIAGNOSIS — N2581 Secondary hyperparathyroidism of renal origin: Secondary | ICD-10-CM | POA: Diagnosis not present

## 2023-12-06 DIAGNOSIS — Z992 Dependence on renal dialysis: Secondary | ICD-10-CM | POA: Diagnosis not present

## 2023-12-06 DIAGNOSIS — E611 Iron deficiency: Secondary | ICD-10-CM | POA: Diagnosis not present

## 2023-12-06 DIAGNOSIS — D688 Other specified coagulation defects: Secondary | ICD-10-CM | POA: Diagnosis not present

## 2023-12-07 ENCOUNTER — Ambulatory Visit (INDEPENDENT_AMBULATORY_CARE_PROVIDER_SITE_OTHER): Admitting: Podiatry

## 2023-12-07 DIAGNOSIS — M79675 Pain in left toe(s): Secondary | ICD-10-CM | POA: Diagnosis not present

## 2023-12-07 DIAGNOSIS — M79674 Pain in right toe(s): Secondary | ICD-10-CM | POA: Diagnosis not present

## 2023-12-07 DIAGNOSIS — B351 Tinea unguium: Secondary | ICD-10-CM | POA: Diagnosis not present

## 2023-12-07 DIAGNOSIS — E1149 Type 2 diabetes mellitus with other diabetic neurological complication: Secondary | ICD-10-CM | POA: Diagnosis not present

## 2023-12-07 DIAGNOSIS — M14671 Charcot's joint, right ankle and foot: Secondary | ICD-10-CM | POA: Diagnosis not present

## 2023-12-07 DIAGNOSIS — L84 Corns and callosities: Secondary | ICD-10-CM

## 2023-12-07 NOTE — Patient Instructions (Signed)

## 2023-12-07 NOTE — Progress Notes (Unsigned)
 Subjective: Chief Complaint  Patient presents with   Callouses    Rm13 Callouses and RFC    58 year old male presents for above concerns.  Due to other issues he is not able to come into the appointment earlier.  The calluses gotten thick again on the plantar aspect of right foot as well as left medial hallux.  No open lesions or any drainage that he reports.  Left 4th nail came off today. No bleeding or injury.   Last A1c was 7.7 on December 02, 2023  Objective: AAO x3, NAD DP/PT pulses palpable bilaterally, CRT less than 3 seconds Sensation decreased with Semmes Weinstein monofilament Along the medial aspect left hallux is an area of thick hyperkeratotic lesion with lesion also noted on the plantar right midfoot.  There is no underlying ulceration, drainage or signs of infection today. Nails are hypertrophic, dystrophic, brittle, discolored, elongated 10. No surrounding redness or drainage. Tenderness nails 1-5 bilaterally except the left fourth toenail which has already come off. Charcot deformity of the right foot resulting in the preulcerative callus.   No open lesions or pre-ulcerative lesions are identified today. No pain with calf compression, swelling, warmth, erythema  Assessment: Preulcerative calluses with resolved ulceration; symptomatic onychomycosis; history of Charcot right foot  Plan: Preulcerative calluses - Sharp debrided x 2 without any complications or bleeding  Symptomatic onychomycosis -Sharply debrided nails x 9 without any complications or bleeding  History of Charcot right foot - Resultant hyperkeratotic lesion which I debrided today without any complications.  This moisturizer and we discussed offloading as well to prevent further skin breakdown.  Return in about 2 months (around 02/06/2024) for nail/callus trim .  Donnice JONELLE Fees DPM

## 2023-12-07 NOTE — Progress Notes (Signed)
 Internal Medicine Clinic Attending  Case discussed with the resident at the time of the visit.  We reviewed the resident's history and exam and pertinent patient test results.  I agree with the assessment, diagnosis, and plan of care documented in the resident's note.

## 2023-12-08 DIAGNOSIS — N2581 Secondary hyperparathyroidism of renal origin: Secondary | ICD-10-CM | POA: Diagnosis not present

## 2023-12-08 DIAGNOSIS — N186 End stage renal disease: Secondary | ICD-10-CM | POA: Diagnosis not present

## 2023-12-08 DIAGNOSIS — E611 Iron deficiency: Secondary | ICD-10-CM | POA: Diagnosis not present

## 2023-12-08 DIAGNOSIS — Z992 Dependence on renal dialysis: Secondary | ICD-10-CM | POA: Diagnosis not present

## 2023-12-08 DIAGNOSIS — D688 Other specified coagulation defects: Secondary | ICD-10-CM | POA: Diagnosis not present

## 2023-12-09 NOTE — Telephone Encounter (Signed)
 Name: Joseph, [name] [name] MRN: [id number]  Date: 12/15/2023 Status: Sch  Time: 10:45 AM Length: 30  Visit Type: OPEN ESTABLISHED [726] Copay: $0.00  Provider: Harrie Bruckner, DO       Copied from CRM [id number]. Topic: Referral - Question >> Dec 09, 2023 11:34 AM Susanna ORN wrote: Reason for CRM: Patient states that he was just seen by Dr. Elnora on August 28th. States he forgot to ask her about a referral to be seen by a specialist for his right knee. He would prefer somewhere in the [place] area. Patient states if he needs to come back in for an appt that would be fine as well. Please give him a call to advise. CB #: [id number]

## 2023-12-10 DIAGNOSIS — Z992 Dependence on renal dialysis: Secondary | ICD-10-CM | POA: Diagnosis not present

## 2023-12-10 DIAGNOSIS — N2581 Secondary hyperparathyroidism of renal origin: Secondary | ICD-10-CM | POA: Diagnosis not present

## 2023-12-10 DIAGNOSIS — D688 Other specified coagulation defects: Secondary | ICD-10-CM | POA: Diagnosis not present

## 2023-12-10 DIAGNOSIS — N186 End stage renal disease: Secondary | ICD-10-CM | POA: Diagnosis not present

## 2023-12-10 DIAGNOSIS — E611 Iron deficiency: Secondary | ICD-10-CM | POA: Diagnosis not present

## 2023-12-13 DIAGNOSIS — N2581 Secondary hyperparathyroidism of renal origin: Secondary | ICD-10-CM | POA: Diagnosis not present

## 2023-12-13 DIAGNOSIS — E611 Iron deficiency: Secondary | ICD-10-CM | POA: Diagnosis not present

## 2023-12-13 DIAGNOSIS — Z992 Dependence on renal dialysis: Secondary | ICD-10-CM | POA: Diagnosis not present

## 2023-12-13 DIAGNOSIS — N186 End stage renal disease: Secondary | ICD-10-CM | POA: Diagnosis not present

## 2023-12-13 DIAGNOSIS — D688 Other specified coagulation defects: Secondary | ICD-10-CM | POA: Diagnosis not present

## 2023-12-14 ENCOUNTER — Ambulatory Visit (INDEPENDENT_AMBULATORY_CARE_PROVIDER_SITE_OTHER): Admitting: Student

## 2023-12-14 ENCOUNTER — Encounter: Payer: Self-pay | Admitting: Student

## 2023-12-14 VITALS — BP 138/71 | HR 78 | Temp 98.4°F | Ht 71.0 in | Wt 202.2 lb

## 2023-12-14 DIAGNOSIS — M14671 Charcot's joint, right ankle and foot: Secondary | ICD-10-CM

## 2023-12-14 DIAGNOSIS — M25562 Pain in left knee: Secondary | ICD-10-CM

## 2023-12-14 DIAGNOSIS — M25561 Pain in right knee: Secondary | ICD-10-CM

## 2023-12-14 DIAGNOSIS — M2100 Valgus deformity, not elsewhere classified, unspecified site: Secondary | ICD-10-CM

## 2023-12-14 MED ORDER — DICLOFENAC SODIUM 1 % EX GEL: 4.0000 g | Freq: Four times a day (QID) | CUTANEOUS | 3 refills | Status: AC

## 2023-12-14 NOTE — Progress Notes (Signed)
   CC: Pain and swelling in L knee  HPI:  [name] is a 58 y.o. male with a PMH stated below who presents today for acute evaluation.  Please see problem based assessment and plan for additional details.  Past Medical History:  Diagnosis Date   Acute exacerbation of CHF (congestive heart failure) (HCC) 12/31/2021   Anxiety    Panic Atacks   Arthritis    Carpal tunnel syndrome    Chronic kidney disease    Constipation    Depression    Diabetes mellitus    DMII   Dysrhythmia    hx tachy hr   GERD (gastroesophageal reflux disease)    no problem in past few years.   Heart disease    Heart murmur    Hepatitis    Hyperlipidemia    Hypertension    Nausea and vomiting 11/12/2022   Neuropathy associated with endocrine disorder (HCC)    Seizures (HCC)    stress related no more since taking Klonopin  2 years- See Neurologist  [facility]   Shortness of breath dyspnea    with exertion   Sleep apnea    used a cpap when he was 350lb-does not need one now 260lb   Wears contact lenses    Review of Systems: ROS negative except for what is noted on the assessment and plan.  Vitals:   12/14/23 0825  BP: 138/71  Pulse: 78  Temp: 98.4 F (36.9 C)  TempSrc: Oral  SpO2: 98%  Weight: 202 lb 3.2 oz (91.7 kg)  Height: 5' 11 (1.803 m)   Physical Exam: Constitutional: well-appearing and in no acute distress Cardiovascular: regular rate and rhythm, no m/r/g Pulmonary/Chest: normal work of breathing on room air, lungs clear to auscultation bilaterally Abdominal: soft, non-tender, non-distended MSK: normal bulk and tone.  The right knee has mild swelling along the joint line and the medial and lateral surface.  There is not erythema or severe warmth.  Range of motion is intact.  There is a valgus deformity at the knee. Skin: warm and dry Psych: normal mood and behavior  Assessment & Plan:   Patient discussed with Dr. Francesco  Right knee pain 1 week of swelling and  tenderness.  Inciting factor was long walking at a hospital when visiting a friend.  Distant history of a ligament repair in that knee.  No trauma.  Exam and history does not support an infectious or reactive or crystal arthropathy.  He has a Charcot foot at that same side and I can appreciate valgus deformity of the R leg.  Suspect combination of that and underlying osteoarthritis.  He has an orthopedist who attends to his back. He is already taking Norco for chronic back pain and it provides moderate relief to the knee.  Given the short timeframe and relatively good health before then, we opted for a watch and wait approach.  I will send Voltaren  gel for him to use as an adjunct to his Norco.  With his concurrent Charcot foot and valgus deformity I will send to PT.  If ongoing, I suggest that he speak with his orthopedist about injections but we can also place another referral if needed.  XR of R knee would be indicated if this issue persists.  RTC in 3 months for general checkup  Lonni Africa, D.O. [facility], PGY-2 Phone: [phone] Date 12/14/2023 Time 12:38 PM

## 2023-12-14 NOTE — Patient Instructions (Signed)
 Voltaren  gel is a topical NSAID medicine that you can use as needed for pain. There is no limit to how much you can use it.  Please expect a phone call to set up physical therapy.  If your pain persists for several weeks, reach out to your orthopedist to see if they do injections. If they do not, reach out to us  and I can place a referral.

## 2023-12-14 NOTE — Assessment & Plan Note (Addendum)
 1 week of swelling and tenderness.  Inciting factor was long walking at a hospital when visiting a friend.  Distant history of a ligament repair in that knee.  No trauma.  Exam and history does not support an infectious or reactive or crystal arthropathy.  He has a Charcot foot at that same side and I can appreciate valgus deformity of the R leg.  Suspect combination of that and underlying osteoarthritis.  He has an orthopedist who attends to his back. He is already taking Norco for chronic back pain and it provides moderate relief to the knee.  Given the short timeframe and relatively good health before then, we opted for a watch and wait approach.  I will send Voltaren  gel for him to use as an adjunct to his Norco.  With his concurrent Charcot foot and valgus deformity I will send to PT.  If ongoing, I suggest that he speak with his orthopedist about injections but we can also place another referral if needed.  XR of R knee would be indicated if this issue persists.

## 2023-12-15 ENCOUNTER — Encounter: Admitting: Student

## 2023-12-15 DIAGNOSIS — N2581 Secondary hyperparathyroidism of renal origin: Secondary | ICD-10-CM | POA: Diagnosis not present

## 2023-12-15 DIAGNOSIS — Z992 Dependence on renal dialysis: Secondary | ICD-10-CM | POA: Diagnosis not present

## 2023-12-15 DIAGNOSIS — E611 Iron deficiency: Secondary | ICD-10-CM | POA: Diagnosis not present

## 2023-12-15 DIAGNOSIS — D688 Other specified coagulation defects: Secondary | ICD-10-CM | POA: Diagnosis not present

## 2023-12-15 DIAGNOSIS — N186 End stage renal disease: Secondary | ICD-10-CM | POA: Diagnosis not present

## 2023-12-15 NOTE — Progress Notes (Signed)
 Internal Medicine Clinic Attending  Case discussed with the resident at the time of the visit.  We reviewed the resident's history and exam and pertinent patient test results.  I agree with the assessment, diagnosis, and plan of care documented in the resident's note.

## 2023-12-17 DIAGNOSIS — Z992 Dependence on renal dialysis: Secondary | ICD-10-CM | POA: Diagnosis not present

## 2023-12-17 DIAGNOSIS — N186 End stage renal disease: Secondary | ICD-10-CM | POA: Diagnosis not present

## 2023-12-17 DIAGNOSIS — D688 Other specified coagulation defects: Secondary | ICD-10-CM | POA: Diagnosis not present

## 2023-12-17 DIAGNOSIS — E611 Iron deficiency: Secondary | ICD-10-CM | POA: Diagnosis not present

## 2023-12-17 DIAGNOSIS — N2581 Secondary hyperparathyroidism of renal origin: Secondary | ICD-10-CM | POA: Diagnosis not present

## 2023-12-20 DIAGNOSIS — N186 End stage renal disease: Secondary | ICD-10-CM | POA: Diagnosis not present

## 2023-12-20 DIAGNOSIS — Z992 Dependence on renal dialysis: Secondary | ICD-10-CM | POA: Diagnosis not present

## 2023-12-20 DIAGNOSIS — D688 Other specified coagulation defects: Secondary | ICD-10-CM | POA: Diagnosis not present

## 2023-12-20 DIAGNOSIS — N2581 Secondary hyperparathyroidism of renal origin: Secondary | ICD-10-CM | POA: Diagnosis not present

## 2023-12-20 DIAGNOSIS — E611 Iron deficiency: Secondary | ICD-10-CM | POA: Diagnosis not present

## 2023-12-22 DIAGNOSIS — Z992 Dependence on renal dialysis: Secondary | ICD-10-CM | POA: Diagnosis not present

## 2023-12-22 DIAGNOSIS — D688 Other specified coagulation defects: Secondary | ICD-10-CM | POA: Diagnosis not present

## 2023-12-22 DIAGNOSIS — E611 Iron deficiency: Secondary | ICD-10-CM | POA: Diagnosis not present

## 2023-12-22 DIAGNOSIS — N186 End stage renal disease: Secondary | ICD-10-CM | POA: Diagnosis not present

## 2023-12-22 DIAGNOSIS — N2581 Secondary hyperparathyroidism of renal origin: Secondary | ICD-10-CM | POA: Diagnosis not present

## 2023-12-27 DIAGNOSIS — E611 Iron deficiency: Secondary | ICD-10-CM | POA: Diagnosis not present

## 2023-12-27 DIAGNOSIS — N2581 Secondary hyperparathyroidism of renal origin: Secondary | ICD-10-CM | POA: Diagnosis not present

## 2023-12-27 DIAGNOSIS — D688 Other specified coagulation defects: Secondary | ICD-10-CM | POA: Diagnosis not present

## 2023-12-27 DIAGNOSIS — Z992 Dependence on renal dialysis: Secondary | ICD-10-CM | POA: Diagnosis not present

## 2023-12-27 DIAGNOSIS — N186 End stage renal disease: Secondary | ICD-10-CM | POA: Diagnosis not present

## 2023-12-28 ENCOUNTER — Encounter: Payer: Self-pay | Admitting: Internal Medicine

## 2023-12-28 ENCOUNTER — Ambulatory Visit (INDEPENDENT_AMBULATORY_CARE_PROVIDER_SITE_OTHER): Admitting: Internal Medicine

## 2023-12-28 VITALS — BP 140/80 | HR 80 | Ht 71.0 in | Wt 203.0 lb

## 2023-12-28 DIAGNOSIS — R112 Nausea with vomiting, unspecified: Secondary | ICD-10-CM | POA: Diagnosis not present

## 2023-12-28 DIAGNOSIS — K219 Gastro-esophageal reflux disease without esophagitis: Secondary | ICD-10-CM

## 2023-12-28 NOTE — Patient Instructions (Signed)
 You have been scheduled for a gastric emptying scan at [facility] on 01/18/2024 at 7:30am. Please arrive at least 30 minutes prior to your appointment for registration. Please make certain not to have anything to eat or drink after midnight the night before your test. Hold all stomach medications (ex: Zofran , phenergan, Reglan ) 24 hours prior to your test. If you need to reschedule your appointment, please contact radiology scheduling at [phone]. _____________________________________________________________________ A gastric-emptying study measures how long it takes for food to move through your stomach. There are several ways to measure stomach emptying. In the most common test, you eat food that contains a small amount of radioactive material. A scanner that detects the movement of the radioactive material is placed over your abdomen to monitor the rate at which food leaves your stomach. This test normally takes about 4 hours to complete. _____________________________________________________________________   I will call you back to schedule your endoscopy at the hospital.  _______________________________________________________  If your blood pressure at your visit was 140/90 or greater, please contact your primary care physician to follow up on this.  _______________________________________________________  If you are age 15 or older, your body mass index should be between 23-30. Your Body mass index is 28.31 kg/m. If this is out of the aforementioned range listed, please consider follow up with your Primary Care Provider.  If you are age 63 or younger, your body mass index should be between 19-25. Your Body mass index is 28.31 kg/m. If this is out of the aformentioned range listed, please consider follow up with your Primary Care Provider.   ________________________________________________________  The [facility] providers would like to encourage you to use MYCHART to  communicate with providers for non-urgent requests or questions.  Due to long hold times on the telephone, sending your provider a message by [facility] may be a faster and more efficient way to get a response.  Please allow 48 business hours for a response.  Please remember that this is for non-urgent requests.  _______________________________________________________  [facility] is using a team-based approach to care.  Your team is made up of your doctor and two to three APPS. Our APPS (Nurse Practitioners and Physician Assistants) work with your physician to ensure care continuity for you. They are fully qualified to address your health concerns and develop a treatment plan. They communicate directly with your gastroenterologist to care for you. Seeing the Advanced Practice Practitioners on your physician's team can help you by facilitating care more promptly, often allowing for earlier appointments, access to diagnostic testing, procedures, and other specialty referrals.

## 2023-12-28 NOTE — Progress Notes (Unsigned)
 HISTORY OF PRESENT ILLNESS:  [name] is a 58 y.o. male with multiple medical problems who presents himself today with complaints of chronic nausea with vomiting.  Prior patient of Dr. Toribio Cedar.  He has been having problems with nausea and vomiting for 5 years.  He has been on Ozempic during that time.  He is a chronic marijuana user.  He has end-stage renal disease on hemodialysis.  He is on multiple medications.  Gastric emptying scan in 2021 was normal.  He underwent both colonoscopy and upper endoscopy with Dr. Cedar July 17, 2019.  Upper endoscopy was normal.  Colonoscopy was normal.  He tells me that he has dry heaves approximately 5 out of 7 days.  Antiemetics do not help.  Not associated with food.  Marijuana does help.  He does have GERD for which she is on pantoprazole .  This helps classic reflux symptoms.  His last hemoglobin A1c was 6.5.  Last BUN 62 with creatinine 11.9.  He dialyzes 3 times per week.    REVIEW OF SYSTEMS:  All non-GI ROS negative except for anxiety, arthritis, back pain, visual change, depression, fatigue, heart murmur, muscle cramps, shortness of breath, sleeping problems, excessive thirst  Past Medical History:  Diagnosis Date   Acute exacerbation of CHF (congestive heart failure) (HCC) 12/31/2021   Anxiety    Panic Atacks   Arthritis    Carpal tunnel syndrome    Chronic kidney disease    Constipation    Depression    Diabetes mellitus    DMII   Dysrhythmia    hx tachy hr   GERD (gastroesophageal reflux disease)    no problem in past few years.   Heart disease    Heart murmur    Hepatitis    Hyperlipidemia    Hypertension    Nausea and vomiting 11/12/2022   Neuropathy associated with endocrine disorder    Seizures (HCC)    stress related no more since taking Klonopin  2 years- See Neurologist  [facility]   Shortness of breath dyspnea    with exertion   Sleep apnea    used a cpap when he was 350lb-does not need one now 260lb    Wears contact lenses     Past Surgical History:  Procedure Laterality Date   A/V SHUNT INTERVENTION N/A 06/29/2023   Procedure: A/V SHUNT INTERVENTION;  Surgeon: Tobie Gordy POUR, MD;  Location: [facility] INVASIVE CV LAB;  Service: Cardiovascular;  Laterality: N/A;   A/V SHUNT INTERVENTION Left 09/23/2023   Procedure: A/V SHUNT INTERVENTION;  Surgeon: Sheree Penne Bruckner, MD;  Location: [facility] PV LAB;  Service: Cardiovascular;  Laterality: Left;   AV FISTULA PLACEMENT Left 09/11/2022   Procedure: LEFT ARM BRACHIOCEPHALIC ARTERIOVENOUS (AV) FISTULA CREATION;  Surgeon: Sheree Penne Bruckner, MD;  Location: [facility] OR;  Service: Vascular;  Laterality: Left;   CATARACT EXTRACTION Bilateral    COLONOSCOPY     KNEE ARTHROSCOPY W/ MENISCAL REPAIR  04/06/1998   left   LUMBAR LAMINECTOMY  04/06/2000   PROSTATE BIOPSY     ROBOT ASSISTED LAPAROSCOPIC RADICAL PROSTATECTOMY N/A 04/15/2023   Procedure: XI ROBOTIC ASSISTED LAPAROSCOPIC RADICAL PROSTATECTOMY LEVEL 2/BILATERAL PELVIC LYMPHADENECTOMY;  Surgeon: Renda Glance, MD;  Location: [facility] ORS;  Service: Urology;  Laterality: N/A;  210 MINUTES NEEDED FOR CASE   VENOUS ANGIOPLASTY Left 06/29/2023   Procedure: VENOUS ANGIOPLASTY;  Surgeon: Tobie Gordy POUR, MD;  Location: [facility] INVASIVE CV LAB;  Service: Cardiovascular;  Laterality: Left;  cephalic arch   VENOUS  STENT  09/23/2023   Procedure: VENOUS STENT;  Surgeon: Sheree Penne Bruckner, MD;  Location: [facility] LAB;  Service: Cardiovascular;;  cephalic arch    Social History Kase Shughart Azimi  reports that he quit smoking about 15 years ago. His smoking use included cigarettes. He has never used smokeless tobacco. He reports that he does not currently use alcohol. He reports current drug use. Frequency: 7.00 times per week. Drug: Marijuana.  family history includes Cancer in his maternal uncle and mother; Colon cancer in his maternal uncle and maternal uncle; Diabetes in his mother; Hypertension in his brother; Renal cancer  in his brother; Stomach cancer in his maternal uncle.  Allergies  Allergen Reactions   Norvasc  [Amlodipine ] Other (See Comments)    Bilateral leg swelling   Zestril  [Lisinopril ] Swelling and Other (See Comments)    Face and legs swell       PHYSICAL EXAMINATION: Vital signs: BP (!) 140/80   Pulse 80   Ht 5' 11 (1.803 m)   Wt 203 lb (92.1 kg)   BMI 28.31 kg/m   Constitutional: Pleasant, chronically ill-appearing, no acute distress Psychiatric: alert and oriented x3, cooperative Eyes: extraocular movements intact, anicteric, conjunctiva pink Mouth: oral pharynx moist, no lesions Neck: supple no lymphadenopathy Cardiovascular: heart regular rate and rhythm. Lungs: clear to auscultation bilaterally Abdomen: soft, nontender, nondistended, no obvious ascites, no peritoneal signs, normal bowel sounds, no organomegaly Rectal: Extremities: no lower extremity edema bilaterally.  Left AV shunt Skin: no lesions on visible extremities Neuro: No focal deficits.  Cranial nerves intact  ASSESSMENT:  1.  Chronic nausea with vomiting.  Not clear to me that this is a primary GI problem.  He does have GERD which is controlled with PPI.  Prior gastric emptying scan was normal.  Prior upper endoscopy was normal.  We did discuss other possibilities such as medications, being under dialyzed, interval development of gastroparesis, and (most likely) chronic cannabis use.  He is skeptical about the latter 2.  End-stage renal disease on dialysis 3.  Insulin  requiring diabetes mellitus 4.  Normal upper endoscopy April 2021 5.  Normal colonoscopy April 2021  PLAN:  1.  Repeat gastric emptying scan 2.  Advised to discontinue marijuana use.  It may take some time, if this is the cause, for his symptoms to resolve. 3.  Schedule upper endoscopy.  This will need to be done at the hospital on a nondialysis day.  If I have no availability, then we will see if he might be scheduled for diagnostic EGD with my  colleagues.  He is high risk given his comorbidities.The nature of the procedure, as well as the risks, benefits, and alternatives were carefully and thoroughly reviewed with the patient. Ample time for discussion and questions allowed. The patient understood, was satisfied, and agreed to proceed. 4.  If no GI cause for his symptoms found, then return to his primary care providers regarding other possibilities (non-GI) that I outlined above. A total time of 60 minutes was spent preparing to see the patient, reviewing a multitude of various records, studies, and results, obtaining comprehensive history, performing medically appropriate physical examination, counseling and educating the patient regarding the above listed issues, ordering advanced radiology study, ordering endoscopy, and documenting clinical information in the health record

## 2023-12-29 ENCOUNTER — Encounter: Payer: Self-pay | Admitting: Internal Medicine

## 2023-12-29 DIAGNOSIS — N2581 Secondary hyperparathyroidism of renal origin: Secondary | ICD-10-CM | POA: Diagnosis not present

## 2023-12-29 DIAGNOSIS — N186 End stage renal disease: Secondary | ICD-10-CM | POA: Diagnosis not present

## 2023-12-29 DIAGNOSIS — D688 Other specified coagulation defects: Secondary | ICD-10-CM | POA: Diagnosis not present

## 2023-12-29 DIAGNOSIS — Z992 Dependence on renal dialysis: Secondary | ICD-10-CM | POA: Diagnosis not present

## 2023-12-29 DIAGNOSIS — E611 Iron deficiency: Secondary | ICD-10-CM | POA: Diagnosis not present

## 2023-12-30 ENCOUNTER — Ambulatory Visit: Admitting: Physical Therapy

## 2023-12-30 NOTE — Therapy (Incomplete)
 OUTPATIENT PHYSICAL THERAPY LOWER EXTREMITY EVALUATION  Patient Name: [name] MRN: [id number] DOB:03-Mar-1966, 58 y.o., male Today's Date: 12/30/2023    Past Medical History:  Diagnosis Date   Acute exacerbation of CHF (congestive heart failure) (HCC) 12/31/2021   Anxiety    Panic Atacks   Arthritis    Carpal tunnel syndrome    Chronic kidney disease    Constipation    Depression    Diabetes mellitus    DMII   Dysrhythmia    hx tachy hr   GERD (gastroesophageal reflux disease)    no problem in past few years.   Heart disease    Heart murmur    Hepatitis    Hyperlipidemia    Hypertension    Nausea and vomiting 11/12/2022   Neuropathy associated with endocrine disorder    Seizures (HCC)    stress related no more since taking Klonopin  2 years- See Neurologist  [facility]   Shortness of breath dyspnea    with exertion   Sleep apnea    used a cpap when he was 350lb-does not need one now 260lb   Wears contact lenses    Past Surgical History:  Procedure Laterality Date   A/V SHUNT INTERVENTION N/A 06/29/2023   Procedure: A/V SHUNT INTERVENTION;  Surgeon: Tobie Gordy POUR, MD;  Location: [facility] INVASIVE CV LAB;  Service: Cardiovascular;  Laterality: N/A;   A/V SHUNT INTERVENTION Left 09/23/2023   Procedure: A/V SHUNT INTERVENTION;  Surgeon: Sheree Penne Bruckner, MD;  Location: [facility];  Service: Cardiovascular;  Laterality: Left;   AV FISTULA PLACEMENT Left 09/11/2022   Procedure: LEFT ARM BRACHIOCEPHALIC ARTERIOVENOUS (AV) FISTULA CREATION;  Surgeon: Sheree Penne Bruckner, MD;  Location: [facility] OR;  Service: Vascular;  Laterality: Left;   CATARACT EXTRACTION Bilateral    COLONOSCOPY     KNEE ARTHROSCOPY W/ MENISCAL REPAIR  04/06/1998   left   LUMBAR LAMINECTOMY  04/06/2000   PROSTATE BIOPSY     ROBOT ASSISTED LAPAROSCOPIC RADICAL PROSTATECTOMY N/A 04/15/2023   Procedure: XI ROBOTIC ASSISTED LAPAROSCOPIC RADICAL PROSTATECTOMY LEVEL 2/BILATERAL PELVIC  LYMPHADENECTOMY;  Surgeon: Renda Glance, MD;  Location: [facility] ORS;  Service: Urology;  Laterality: N/A;  210 MINUTES NEEDED FOR CASE   VENOUS ANGIOPLASTY Left 06/29/2023   Procedure: VENOUS ANGIOPLASTY;  Surgeon: Tobie Gordy POUR, MD;  Location: [facility] INVASIVE CV LAB;  Service: Cardiovascular;  Laterality: Left;  cephalic arch   VENOUS STENT  3/80/7974   Procedure: VENOUS STENT;  Surgeon: Sheree Penne Bruckner, MD;  Location: [facility];  Service: Cardiovascular;;  cephalic arch   Patient Active Problem List   Diagnosis Date Noted   Right knee pain 12/14/2023   Abnormal CT of the abdomen with potential esophagitis and mediastinal density 12/05/2023   Esophagitis 12/05/2023   Aortic atherosclerosis 12/05/2023   Sigmoid diverticulosis 12/05/2023   Prostate cancer (HCC) 04/15/2023   Malignant neoplasm of prostate (HCC) 02/08/2023   Disc degeneration, lumbar 12/24/2022   Nausea and vomiting 11/12/2022   Liver dysfunction 02/03/2022   OSA (obstructive sleep apnea) 01/01/2022   ESRD (end stage renal disease) on dialysis (HCC) 12/31/2021   (HFpEF) heart failure with preserved ejection fraction (HCC) 12/23/2021   Constipation 10/10/2021   Unintentional weight loss 09/28/2021   Charcot foot due to diabetes mellitus (HCC) 09/05/2020   Depression    Diabetic neuropathy (HCC) 09/19/2019   Hyperlipidemia 05/10/2019   Chronic hepatitis C (HCC) 05/10/2019   Abnormal CT scan of lung 05/10/2019   Type 2 diabetes mellitus (HCC)  01/29/2016   Essential hypertension 01/29/2016   Lumbar facet arthropathy 08/29/2014    PCP: Harrie Bruckner, DO  REFERRING PROVIDER: Lovie Clarity, MD  THERAPY DIAG:  No diagnosis found.  REFERRING DIAG: Acute pain of right knee [M25.561], Acquired valgus deformity of right leg [M21.00], Charcot joint of right foot [M14.671]   Rationale for Evaluation and Treatment:  Rehabilitation  SUBJECTIVE:  PERTINENT PAST HISTORY:  DM, LBP, Hep C, diabetic neruopathy,  dpression, charcot foot, CHF, ESRD,         PRECAUTIONS: {Therapy precautions:24002}  WEIGHT BEARING RESTRICTIONS {Yes ***/No:24003}  FALLS:  Has patient fallen in last 6 months? {yes/no:20286}, Number of falls: ***  MOI/History of condition:  Onset date: ***  SUBJECTIVE STATEMENT  Pt is a 58 y.o. male who presents to clinic with chief complaint of ***.  ***   Red flags:  {has/denies:26543} {kerredflag:26542}  Pain:  Are you having pain? {yes/no:20286} Pain location: *** NPRS scale:  Best: {NUMBERS; 0-10:5044}/10, Worst: {NUMBERS; 0-10:5044}/10 Aggravating factors: *** Relieving factors: *** Pain description: {PAIN DESCRIPTION:21022940}  Occupation: ***  Assistive Device: ***  Hand Dominance: ***  Patient Goals/Specific Activities: ***   OBJECTIVE:   DIAGNOSTIC FINDINGS:  ***  GENERAL OBSERVATION/GAIT: ***  SENSATION: Light touch: {intact/deficits:24005}  PALPATION: ***  MUSCLE LENGTH: Hamstrings: Right {kerminsig:27227} restriction; Left {kerminsig:27227} restriction Hip flexors: Right {kerminsig:27227} restriction; Left {kerminsig:27227} restriction Rec Fem: Right {kerminsig:27227} restriction; Left {kerminsig:27227} restriction  LE MMT:  MMT Right (Eval) Left (Eval)  Hip flexion (L2, L3) *** ***  Knee extension (L3) *** ***  Knee flexion *** ***  Hip abduction *** ***  Hip extension *** ***  Hip external rotation    Hip internal rotation    Hip adduction    Ankle dorsiflexion (L4)    Ankle plantarflexion (S1)    Ankle inversion    Ankle eversion    Great Toe ext (L5)    Grossly     (Blank rows = not tested, score listed is out of 5 possible points.  N = WNL, D = diminished, C = clear for gross weakness with myotome testing, * = concordant pain with testing)  LE ROM:  ROM Right (Eval) Left (Eval)  Hip flexion    Hip extension    Hip abduction    Hip adduction    Hip internal rotation    Hip external rotation    Knee extension     Knee flexion    Ankle dorsiflexion    Ankle plantarflexion    Ankle inversion    Ankle eversion     (Blank rows = not tested, N = WNL, * = concordant pain with testing)  Functional Tests  Eval                                                              SPECIAL TESTS:  ***   PATIENT SURVEYS:  ***   TODAY'S TREATMENT:  Therapeutic Exercise: Creating, reviewing, and completing below HEP   PATIENT EDUCATION ([facility]ul[facility]/HM):  POC, diagnosis, prognosis, HEP, and outcome measures.  Pt educated via explanation, demonstration, and handout (HEP).  Pt confirms understanding verbally.   HOME EXERCISE PROGRAM: ***  Treatment priorities   Eval                                                  ASSESSMENT:  CLINICAL IMPRESSION: [name] is a 58 y.o. male who presents to clinic with signs and sxs consistent with ***.   ***.   [name] will benefit from skilled PT to address relevant deficits and improve ***.   OBJECTIVE IMPAIRMENTS: Pain, ***  ACTIVITY LIMITATIONS: ***  PERSONAL FACTORS: See medical history and pertinent history   REHAB POTENTIAL: Good  CLINICAL DECISION MAKING: Evolving/moderate complexity  EVALUATION COMPLEXITY: Moderate   GOALS:   SHORT TERM GOALS: Target date: ***  [name] will be >75% HEP compliant to improve carryover between sessions and facilitate independent management of condition  Evaluation: ongoing Goal status: INITIAL   LONG TERM GOALS: Target date: ***  [name] will self report >/= 50% decrease in pain from evaluation to improve function in daily tasks  Evaluation/Baseline: ***/10 max pain Goal status: INITIAL   2.  [name] will show a >/= *** pt improvement in LEFS score (MCID is ~11% or 9 pts) as a proxy for functional improvement   Evaluation/Baseline: *** pts Goal status:  INITIAL   3.  [name] will be able to ***, not limited by pain  Evaluation/Baseline: limited Goal status: INITIAL   4.  ***   5.  ***   6.  ***   PLAN: PT FREQUENCY: 1-2x/week  PT DURATION: 8 weeks  PLANNED INTERVENTIONS:  97164- PT Re-evaluation, 97110-Therapeutic exercises, 97530- Therapeutic activity, [id number]- Neuromuscular re-education, 97535- Self Care, 02859- Manual therapy, [id number]- Gait training, [id number]- Aquatic Therapy, [phone]- Electrical stimulation (manual), [id number]- Vasopneumatic device, [id number]- Traction (mechanical), [id number]- Ionotophoresis 4mg /ml Dexamethasone , Taping, Dry Needling, Joint manipulation, and Spinal manipulation.   Lavell Ridings PT, DPT 12/30/2023, 12:42 PM

## 2023-12-31 DIAGNOSIS — N186 End stage renal disease: Secondary | ICD-10-CM | POA: Diagnosis not present

## 2023-12-31 DIAGNOSIS — E611 Iron deficiency: Secondary | ICD-10-CM | POA: Diagnosis not present

## 2023-12-31 DIAGNOSIS — N2581 Secondary hyperparathyroidism of renal origin: Secondary | ICD-10-CM | POA: Diagnosis not present

## 2023-12-31 DIAGNOSIS — Z992 Dependence on renal dialysis: Secondary | ICD-10-CM | POA: Diagnosis not present

## 2023-12-31 DIAGNOSIS — D688 Other specified coagulation defects: Secondary | ICD-10-CM | POA: Diagnosis not present

## 2024-01-04 DIAGNOSIS — E611 Iron deficiency: Secondary | ICD-10-CM | POA: Diagnosis not present

## 2024-01-04 DIAGNOSIS — N2581 Secondary hyperparathyroidism of renal origin: Secondary | ICD-10-CM | POA: Diagnosis not present

## 2024-01-04 DIAGNOSIS — Z992 Dependence on renal dialysis: Secondary | ICD-10-CM | POA: Diagnosis not present

## 2024-01-04 DIAGNOSIS — D688 Other specified coagulation defects: Secondary | ICD-10-CM | POA: Diagnosis not present

## 2024-01-04 DIAGNOSIS — N186 End stage renal disease: Secondary | ICD-10-CM | POA: Diagnosis not present

## 2024-01-04 DIAGNOSIS — E1122 Type 2 diabetes mellitus with diabetic chronic kidney disease: Secondary | ICD-10-CM | POA: Diagnosis not present

## 2024-01-11 DIAGNOSIS — R29818 Other symptoms and signs involving the nervous system: Secondary | ICD-10-CM | POA: Diagnosis not present

## 2024-01-11 DIAGNOSIS — E1122 Type 2 diabetes mellitus with diabetic chronic kidney disease: Secondary | ICD-10-CM | POA: Diagnosis not present

## 2024-01-18 ENCOUNTER — Encounter (HOSPITAL_COMMUNITY): Admission: RE | Admit: 2024-01-18 | Source: Ambulatory Visit

## 2024-01-18 ENCOUNTER — Other Ambulatory Visit: Payer: Self-pay | Admitting: Student

## 2024-01-25 ENCOUNTER — Other Ambulatory Visit: Payer: Self-pay | Admitting: Student

## 2024-01-25 ENCOUNTER — Telehealth: Payer: Self-pay | Admitting: *Deleted

## 2024-01-25 DIAGNOSIS — K209 Esophagitis, unspecified without bleeding: Secondary | ICD-10-CM

## 2024-01-25 NOTE — Telephone Encounter (Signed)
 Copied from CRM [id number]. Topic: General - Other >> Jan 25, 2024 11:42 AM Susanna ORN wrote: Reason for CRM: [place], with [facility] Chronic Conditions dept, called to speak with provider or nurse to get verification of chronic conditions for the patient. She states she will also fax information in regards to the verification to the clinic's fax number as well. She states it's important that this is completed and they can obtain verbal verification over the phone as well. Please give her a call back at [phone].

## 2024-01-25 NOTE — Telephone Encounter (Signed)
 [facility] - received a recording and a reference #. Unable to leave a message.

## 2024-02-01 ENCOUNTER — Encounter (HOSPITAL_COMMUNITY)
Admission: RE | Admit: 2024-02-01 | Discharge: 2024-02-01 | Disposition: A | Discharge status: Home / Self Care | Source: Ambulatory Visit | Attending: Internal Medicine | Admitting: Internal Medicine

## 2024-02-01 ENCOUNTER — Encounter (HOSPITAL_COMMUNITY): Payer: Self-pay

## 2024-02-01 DIAGNOSIS — R112 Nausea with vomiting, unspecified: Secondary | ICD-10-CM | POA: Insufficient documentation

## 2024-02-01 MED ORDER — TECHNETIUM TC 99M SULFUR COLLOID
2.0000 | Freq: Once | INTRAVENOUS | Status: AC
  Administered 2024-02-01: 2 via ORAL

## 2024-02-03 ENCOUNTER — Ambulatory Visit: Payer: Self-pay | Admitting: Internal Medicine

## 2024-02-04 NOTE — Telephone Encounter (Signed)
 Copied from CRM [id number]. Topic: General - Other >> Jan 25, 2024 11:42 AM Susanna ORN wrote: Reason for CRM: [place], with [facility] Chronic Conditions dept, called to speak with provider or nurse to get verification of chronic conditions for the patient. She states she will also fax information in regards to the verification to the clinic's fax number as well. She states it's important that this is completed and they can obtain verbal verification over the phone as well. Please give her a call back at [phone]. >> Feb 04, 2024 11:40 AM Miquel SAILOR wrote: Ivin from [phone]: Calling on updated for paper work for Chronic condition for PT on his Plan verifying.

## 2024-02-04 NOTE — Telephone Encounter (Signed)
 We need ROI form fax to our office. I will ask Hme if she had received the form.

## 2024-02-09 NOTE — Telephone Encounter (Signed)
 I called [facility] back unable to get someone on the phone. No one answer, unable to lvm. I have not received the chronic verification form for this from [facility].

## 2024-02-11 ENCOUNTER — Encounter: Payer: Self-pay | Admitting: *Deleted

## 2024-02-15 ENCOUNTER — Ambulatory Visit: Attending: Internal Medicine | Admitting: Internal Medicine

## 2024-02-22 ENCOUNTER — Other Ambulatory Visit: Payer: Self-pay | Admitting: Student

## 2024-02-22 DIAGNOSIS — E119 Type 2 diabetes mellitus without complications: Secondary | ICD-10-CM

## 2024-02-22 NOTE — Telephone Encounter (Signed)
 Medication sent to pharmacy

## 2024-02-23 ENCOUNTER — Telehealth: Payer: Self-pay | Admitting: Internal Medicine

## 2024-02-23 ENCOUNTER — Ambulatory Visit: Payer: Self-pay

## 2024-02-23 NOTE — Telephone Encounter (Signed)
 RTC to patient stated that he is getting someone now to take him over to the ER.  Patient to call for a follow up appointment after his visit to the ER.

## 2024-02-23 NOTE — Telephone Encounter (Signed)
 Inbound call from patient and Dena from [facility] calling to get patient scheduled for his EGD that was discussed at his last office visit with Dr. Abran. I advised Dena that procedure needed to be done at the hospital per Dr. Nancyann note. She is requesting a call back to discuss getting him scheduled.  Brianna's number is [phone].

## 2024-02-23 NOTE — Telephone Encounter (Signed)
 FYI Only or Action Required?: FYI only for provider: ED advised.  Patient was last seen in primary care on 12/14/2023 by Harrie Bruckner, DO.  Called Nurse Triage reporting Diarrhea.  Symptoms began several days ago.  Interventions attempted: Nothing.  Symptoms are: unchanged.  Triage Disposition: Go to ED Now (or PCP Triage)  Patient/caregiver understands and will follow disposition?: Yes   Reason for Disposition  [1] Drinking very little AND [2] dehydration suspected (e.g., no urine > 12 hours, very dry mouth, very lightheaded)  Answer Assessment - Initial Assessment Questions Advised ED.  Patient reports will have someone to drive to ED.   1. DIARRHEA SEVERITY: How bad is the diarrhea? How many more stools have you had in the past 24 hours than normal?      2x BM, loose and watery, brown 2. ONSET: When did the diarrhea begin?      2 weeks ago 3. STOOL DESCRIPTION:  How loose or watery is the diarrhea? What is the stool color? Is there any blood or mucous in the stool?     Denies blood or mucous 4. VOMITING: Are you also vomiting? If Yes, ask: How many times in the past 24 hours?      Today was able keep down food and drinks; previously unable to Vomited; x5 5. ABDOMEN PAIN: Are you having any abdomen pain? If Yes, ask: What does it feel like? (e.g., crampy, dull, intermittent, constant)      Aching when eating, hard to touch, pain throughout abdomen 6. ABDOMEN PAIN SEVERITY: If present, ask: How bad is the pain?  (e.g., Scale 1-10; mild, moderate, or severe)     6/10 7. ORAL INTAKE: If vomiting, Have you been able to drink liquids? How much liquids have you had in the past 24 hours?     Less than 20 oz 8. HYDRATION: Any signs of dehydration? (e.g., dry mouth [not just dry lips], too weak to stand, dizziness, new weight loss) When did you last urinate?     Tired easily, dizziness intermittent, dry mouth, cramps during dialysis denies  faint 9. EXPOSURE: Have you traveled to a foreign country recently? Have you been exposed to anyone with diarrhea? Could you have eaten any food that was spoiled?     no 10. ANTIBIOTIC USE: Are you taking antibiotics now or have you taken antibiotics in the past 2 months?       no 11. OTHER SYMPTOMS: Do you have any other symptoms? (e.g., fever, blood in stool)    Denies fever, chills,  Missed last Wednesday and Friday; missed 2 Hemodialysis treatment nausea  Protocols used: [facility] Message from [place] [facility] sent at 02/23/2024  3:18 PM EST  Reason for Triage: Patient states he has been having stomach issues, not able to keep food down and severe Diarrhea

## 2024-02-23 NOTE — Telephone Encounter (Signed)
 Pt needs egd at hospital due to pt being on dialysis. He has it on MWF. Trying to find a slot on Tues/thurs. Let Bri know we will call back when we can find a spot.

## 2024-03-07 ENCOUNTER — Ambulatory Visit: Admitting: Student

## 2024-04-07 ENCOUNTER — Other Ambulatory Visit: Payer: Self-pay

## 2024-04-07 DIAGNOSIS — Z1211 Encounter for screening for malignant neoplasm of colon: Secondary | ICD-10-CM

## 2024-04-07 NOTE — Telephone Encounter (Signed)
 Pt scheduled for previsit 05/23/24 at 10am, colon scheduled at [facility] 06/08/24 at 8:30am. Pt will need to arrive at [facility] at 7am. Called to speak with Dena RN at [facility]. Will try again Monday.

## 2024-04-10 NOTE — Telephone Encounter (Signed)
 Spoke with Bri, Nurse at kidney center. She is aware of appts and pt is there now. She will notify him of the appts.

## 2024-04-16 NOTE — Progress Notes (Unsigned)
"   °  °  Cardiology Office Note Date:  04/16/2024  ID:  Trystin Terhune Riojas, DOB 05-22-1965, MRN [id number] PCP:  Harrie Bruckner, DO  Cardiologist:  Joelle VEAR Ren Donley, MD  No chief complaint on file.    Problems Diffuse CAC on CT HFpEF TTE 9/23: 60-65% ESRD on HD Bilateral carotid bruits 8/24: Mild bilateral carotid disease 8/26: Repeat Former tobacco use M: AN40, BE4BID, CL25, Insulin   Visits  1/26: LP, HA1C, GLP1    Discussed the use of AI scribe software for clinical note transcription with the patient, who gave verbal consent to proceed.  History of Present Illness     ROS: Please see the history of present illness. All other systems are reviewed and negative.   PHYSICAL EXAM: VS:  There were no vitals taken for this visit. , BMI There is no height or weight on file to calculate BMI. GEN: Well nourished, well developed, in no acute distress HEENT: normal Neck: no JVD, carotid bruits, or masses Cardiac: ***RRR; no murmurs, rubs, or gallops,no edema  Respiratory:  CTAB bilaterally, normal work of breathing GI: soft, nontender, nondistended, + BS Extremities: No LE edema Skin: warm and dry, no rash Neuro:  Strength and sensation are intact  Recent Labs: Reviewed  Studies: Reviewed  Assessment and Plan Assessment & Plan       Signed, Joelle VEAR Ren Donley, MD  04/16/2024 9:45 AM    [facility] "

## 2024-04-19 ENCOUNTER — Observation Stay (HOSPITAL_COMMUNITY)
Admission: EM | Admit: 2024-04-19 | Discharge: 2024-04-21 | Disposition: A | Discharge status: Home / Self Care | Attending: Infectious Diseases | Admitting: Infectious Diseases

## 2024-04-19 ENCOUNTER — Other Ambulatory Visit: Payer: Self-pay | Admitting: Student

## 2024-04-19 ENCOUNTER — Other Ambulatory Visit: Payer: Self-pay

## 2024-04-19 ENCOUNTER — Emergency Department (HOSPITAL_COMMUNITY)

## 2024-04-19 ENCOUNTER — Encounter (HOSPITAL_COMMUNITY): Payer: Self-pay

## 2024-04-19 DIAGNOSIS — Z8619 Personal history of other infectious and parasitic diseases: Secondary | ICD-10-CM | POA: Insufficient documentation

## 2024-04-19 DIAGNOSIS — I160 Hypertensive urgency: Secondary | ICD-10-CM | POA: Insufficient documentation

## 2024-04-19 DIAGNOSIS — I5032 Chronic diastolic (congestive) heart failure: Secondary | ICD-10-CM | POA: Diagnosis not present

## 2024-04-19 DIAGNOSIS — E785 Hyperlipidemia, unspecified: Secondary | ICD-10-CM | POA: Insufficient documentation

## 2024-04-19 DIAGNOSIS — R7989 Other specified abnormal findings of blood chemistry: Secondary | ICD-10-CM | POA: Diagnosis not present

## 2024-04-19 DIAGNOSIS — Z992 Dependence on renal dialysis: Secondary | ICD-10-CM | POA: Insufficient documentation

## 2024-04-19 DIAGNOSIS — R0602 Shortness of breath: Secondary | ICD-10-CM | POA: Diagnosis not present

## 2024-04-19 DIAGNOSIS — K921 Melena: Secondary | ICD-10-CM | POA: Insufficient documentation

## 2024-04-19 DIAGNOSIS — R079 Chest pain, unspecified: Secondary | ICD-10-CM | POA: Insufficient documentation

## 2024-04-19 DIAGNOSIS — R197 Diarrhea, unspecified: Secondary | ICD-10-CM | POA: Diagnosis not present

## 2024-04-19 DIAGNOSIS — F1290 Cannabis use, unspecified, uncomplicated: Secondary | ICD-10-CM | POA: Insufficient documentation

## 2024-04-19 DIAGNOSIS — E119 Type 2 diabetes mellitus without complications: Secondary | ICD-10-CM | POA: Diagnosis not present

## 2024-04-19 DIAGNOSIS — N186 End stage renal disease: Secondary | ICD-10-CM | POA: Diagnosis not present

## 2024-04-19 DIAGNOSIS — R1116 Cannabis hyperemesis syndrome: Secondary | ICD-10-CM

## 2024-04-19 DIAGNOSIS — G2581 Restless legs syndrome: Secondary | ICD-10-CM | POA: Diagnosis not present

## 2024-04-19 DIAGNOSIS — Z87891 Personal history of nicotine dependence: Secondary | ICD-10-CM | POA: Insufficient documentation

## 2024-04-19 DIAGNOSIS — Z79899 Other long term (current) drug therapy: Secondary | ICD-10-CM | POA: Insufficient documentation

## 2024-04-19 DIAGNOSIS — I5030 Unspecified diastolic (congestive) heart failure: Secondary | ICD-10-CM

## 2024-04-19 DIAGNOSIS — E1122 Type 2 diabetes mellitus with diabetic chronic kidney disease: Secondary | ICD-10-CM | POA: Diagnosis not present

## 2024-04-19 DIAGNOSIS — E872 Acidosis, unspecified: Secondary | ICD-10-CM | POA: Diagnosis not present

## 2024-04-19 DIAGNOSIS — D509 Iron deficiency anemia, unspecified: Secondary | ICD-10-CM | POA: Diagnosis not present

## 2024-04-19 DIAGNOSIS — R112 Nausea with vomiting, unspecified: Secondary | ICD-10-CM | POA: Diagnosis present

## 2024-04-19 DIAGNOSIS — G8929 Other chronic pain: Secondary | ICD-10-CM | POA: Diagnosis not present

## 2024-04-19 DIAGNOSIS — F1210 Cannabis abuse, uncomplicated: Secondary | ICD-10-CM

## 2024-04-19 DIAGNOSIS — I132 Hypertensive heart and chronic kidney disease with heart failure and with stage 5 chronic kidney disease, or end stage renal disease: Secondary | ICD-10-CM | POA: Insufficient documentation

## 2024-04-19 DIAGNOSIS — B182 Chronic viral hepatitis C: Secondary | ICD-10-CM | POA: Diagnosis not present

## 2024-04-19 DIAGNOSIS — I10 Essential (primary) hypertension: Secondary | ICD-10-CM | POA: Diagnosis present

## 2024-04-19 LAB — TROPONIN T, HIGH SENSITIVITY
Troponin T High Sensitivity: 145 ng/L (ref 0–19)
Troponin T High Sensitivity: 147 ng/L (ref 0–19)

## 2024-04-19 LAB — URINALYSIS, ROUTINE W REFLEX MICROSCOPIC
Bacteria, UA: NONE SEEN
Bilirubin Urine: NEGATIVE
Glucose, UA: 50 mg/dL — AB
Ketones, ur: NEGATIVE mg/dL
Leukocytes,Ua: NEGATIVE
Nitrite: NEGATIVE
Protein, ur: >=300 mg/dL — AB
Specific Gravity, Urine: 1.011 (ref 1.005–1.030)
pH: 6 (ref 5.0–8.0)

## 2024-04-19 LAB — CBC
HCT: 32.1 % — ABNORMAL LOW (ref 39.0–52.0)
HCT: 28.2 % — ABNORMAL LOW (ref 39.0–52.0)
Hemoglobin: 10.9 g/dL — ABNORMAL LOW (ref 13.0–17.0)
Hemoglobin: 9.8 g/dL — ABNORMAL LOW (ref 13.0–17.0)
MCH: 31.6 pg (ref 26.0–34.0)
MCH: 31.5 pg (ref 26.0–34.0)
MCHC: 34 g/dL (ref 30.0–36.0)
MCHC: 34.8 g/dL (ref 30.0–36.0)
MCV: 93 fL (ref 80.0–100.0)
MCV: 90.7 fL (ref 80.0–100.0)
Platelets: 266 K/uL (ref 150–400)
Platelets: 231 K/uL (ref 150–400)
RBC: 3.45 MIL/uL — ABNORMAL LOW (ref 4.22–5.81)
RBC: 3.11 MIL/uL — ABNORMAL LOW (ref 4.22–5.81)
RDW: 13.7 % (ref 11.5–15.5)
RDW: 13.6 % (ref 11.5–15.5)
WBC: 7.4 K/uL (ref 4.0–10.5)
WBC: 7.1 K/uL (ref 4.0–10.5)
nRBC: 0 % (ref 0.0–0.2)
nRBC: 0 % (ref 0.0–0.2)

## 2024-04-19 LAB — COMPREHENSIVE METABOLIC PANEL WITH GFR
ALT: 10 U/L (ref 0–44)
AST: 17 U/L (ref 15–41)
Albumin: 3.7 g/dL (ref 3.5–5.0)
Alkaline Phosphatase: 61 U/L (ref 38–126)
Anion gap: 31 — ABNORMAL HIGH (ref 5–15)
BUN: 124 mg/dL — ABNORMAL HIGH (ref 6–20)
CO2: 17 mmol/L — ABNORMAL LOW (ref 22–32)
Calcium: 8 mg/dL — ABNORMAL LOW (ref 8.9–10.3)
Chloride: 89 mmol/L — ABNORMAL LOW (ref 98–111)
Creatinine, Ser: 18.2 mg/dL — ABNORMAL HIGH (ref 0.61–1.24)
GFR, Estimated: 3 mL/min — ABNORMAL LOW
Glucose, Bld: 120 mg/dL — ABNORMAL HIGH (ref 70–99)
Potassium: 4.9 mmol/L (ref 3.5–5.1)
Sodium: 137 mmol/L (ref 135–145)
Total Bilirubin: 0.4 mg/dL (ref 0.0–1.2)
Total Protein: 8.2 g/dL — ABNORMAL HIGH (ref 6.5–8.1)

## 2024-04-19 LAB — CREATININE, SERUM
Creatinine, Ser: 18.2 mg/dL — ABNORMAL HIGH (ref 0.61–1.24)
GFR, Estimated: 3 mL/min — ABNORMAL LOW

## 2024-04-19 LAB — HIV ANTIBODY (ROUTINE TESTING W REFLEX): HIV Screen 4th Generation wRfx: NONREACTIVE

## 2024-04-19 LAB — CBG MONITORING, ED: Glucose-Capillary: 123 mg/dL — ABNORMAL HIGH (ref 70–99)

## 2024-04-19 LAB — LIPASE, BLOOD: Lipase: 109 U/L — ABNORMAL HIGH (ref 11–51)

## 2024-04-19 LAB — I-STAT CG4 LACTIC ACID, ED: Lactic Acid, Venous: 0.6 mmol/L (ref 0.5–1.9)

## 2024-04-19 LAB — HEPATITIS B SURFACE ANTIGEN: Hepatitis B Surface Ag: NONREACTIVE

## 2024-04-19 MED ORDER — GABAPENTIN 100 MG PO CAPS
100.0000 mg | ORAL_CAPSULE | Freq: Every day | ORAL | Status: DC
  Administered 2024-04-20: 100 mg via ORAL
  Filled 2024-04-19 (×2): qty 1

## 2024-04-19 MED ORDER — LIDOCAINE-PRILOCAINE 2.5-2.5 % EX CREA: 1.0000 | TOPICAL_CREAM | CUTANEOUS | Status: DC | PRN

## 2024-04-19 MED ORDER — DULOXETINE HCL 20 MG PO CPEP
20.0000 mg | ORAL_CAPSULE | Freq: Every day | ORAL | Status: DC
  Administered 2024-04-19 – 2024-04-21 (×3): 20 mg via ORAL
  Filled 2024-04-19 (×3): qty 1

## 2024-04-19 MED ORDER — HALOPERIDOL 0.5 MG PO TABS: 0.5000 mg | ORAL_TABLET | Freq: Four times a day (QID) | ORAL | Status: DC | PRN

## 2024-04-19 MED ORDER — ALTEPLASE 2 MG IJ SOLR: 2.0000 mg | Freq: Once | INTRAMUSCULAR | Status: DC | PRN

## 2024-04-19 MED ORDER — HEPARIN SODIUM (PORCINE) 5000 UNIT/ML IJ SOLN
5000.0000 [IU] | Freq: Three times a day (TID) | INTRAMUSCULAR | Status: DC
  Administered 2024-04-19 – 2024-04-21 (×5): 5000 [IU] via SUBCUTANEOUS
  Filled 2024-04-19 (×5): qty 1

## 2024-04-19 MED ORDER — LABETALOL HCL 5 MG/ML IV SOLN
10.0000 mg | Freq: Once | INTRAVENOUS | Status: AC
  Administered 2024-04-19: 10 mg via INTRAVENOUS
  Filled 2024-04-19: qty 4

## 2024-04-19 MED ORDER — HALOPERIDOL LACTATE 5 MG/ML IJ SOLN
1.0000 mg | Freq: Four times a day (QID) | INTRAMUSCULAR | Status: DC | PRN
  Administered 2024-04-20 – 2024-04-21 (×3): 1 mg via INTRAMUSCULAR
  Filled 2024-04-19 (×3): qty 1

## 2024-04-19 MED ORDER — CARVEDILOL 6.25 MG PO TABS
6.2500 mg | ORAL_TABLET | Freq: Two times a day (BID) | ORAL | Status: DC
  Filled 2024-04-19: qty 2

## 2024-04-19 MED ORDER — CALCITRIOL 0.25 MCG PO CAPS
0.2500 ug | ORAL_CAPSULE | Freq: Every day | ORAL | Status: DC
  Administered 2024-04-19 – 2024-04-21 (×3): 0.25 ug via ORAL
  Filled 2024-04-19 (×3): qty 1

## 2024-04-19 MED ORDER — HYDROCODONE-ACETAMINOPHEN 5-325 MG PO TABS
1.0000 | ORAL_TABLET | Freq: Three times a day (TID) | ORAL | Status: DC | PRN
  Administered 2024-04-20 – 2024-04-21 (×2): 1 via ORAL
  Filled 2024-04-19 (×2): qty 1

## 2024-04-19 MED ORDER — CAPSAICIN 0.075 % EX CREA
TOPICAL_CREAM | Freq: Two times a day (BID) | CUTANEOUS | Status: DC
  Filled 2024-04-19: qty 57

## 2024-04-19 MED ORDER — PENTAFLUOROPROP-TETRAFLUOROETH EX AERO: 1.0000 | INHALATION_SPRAY | CUTANEOUS | Status: DC | PRN

## 2024-04-19 MED ORDER — HEPARIN SODIUM (PORCINE) 1000 UNIT/ML IJ SOLN
INTRAMUSCULAR | Status: AC
  Filled 2024-04-19: qty 3

## 2024-04-19 MED ORDER — HYDROMORPHONE HCL 1 MG/ML IJ SOLN
0.5000 mg | Freq: Once | INTRAMUSCULAR | Status: AC
  Administered 2024-04-19: 0.5 mg via INTRAVENOUS
  Filled 2024-04-19: qty 1

## 2024-04-19 MED ORDER — MELATONIN 3 MG PO TABS
3.0000 mg | ORAL_TABLET | Freq: Every day | ORAL | Status: DC
  Administered 2024-04-20: 3 mg via ORAL
  Filled 2024-04-19 (×2): qty 1

## 2024-04-19 MED ORDER — SERTRALINE HCL 100 MG PO TABS
100.0000 mg | ORAL_TABLET | Freq: Every day | ORAL | Status: DC
  Administered 2024-04-20 – 2024-04-21 (×2): 100 mg via ORAL
  Filled 2024-04-19 (×4): qty 1

## 2024-04-19 MED ORDER — PANTOPRAZOLE SODIUM 40 MG PO TBEC
40.0000 mg | DELAYED_RELEASE_TABLET | Freq: Every day | ORAL | Status: DC
  Administered 2024-04-19 – 2024-04-21 (×3): 40 mg via ORAL
  Filled 2024-04-19 (×3): qty 1

## 2024-04-19 MED ORDER — SEVELAMER CARBONATE 800 MG PO TABS
1600.0000 mg | ORAL_TABLET | Freq: Three times a day (TID) | ORAL | Status: DC
  Administered 2024-04-21: 1600 mg via ORAL
  Filled 2024-04-19: qty 3
  Filled 2024-04-19: qty 2
  Filled 2024-04-19: qty 3

## 2024-04-19 MED ORDER — LIDOCAINE HCL (PF) 1 % IJ SOLN: 5.0000 mL | INTRAMUSCULAR | Status: DC | PRN

## 2024-04-19 MED ORDER — HEPARIN SODIUM (PORCINE) 1000 UNIT/ML DIALYSIS
1000.0000 [IU] | INTRAMUSCULAR | Status: DC | PRN
  Administered 2024-04-19: 3000 [IU] via INTRAVENOUS_CENTRAL
  Filled 2024-04-19: qty 1

## 2024-04-19 MED ORDER — PENTAFLUOROPROP-TETRAFLUOROETH EX AERO
INHALATION_SPRAY | CUTANEOUS | Status: AC
  Filled 2024-04-19: qty 30

## 2024-04-19 MED ORDER — CHLORHEXIDINE GLUCONATE CLOTH 2 % EX PADS
6.0000 | MEDICATED_PAD | Freq: Every day | CUTANEOUS | Status: DC
  Administered 2024-04-20: 6 via TOPICAL

## 2024-04-19 MED ORDER — ONDANSETRON HCL 4 MG/2ML IJ SOLN
4.0000 mg | Freq: Once | INTRAMUSCULAR | Status: AC
  Administered 2024-04-19: 4 mg via INTRAVENOUS
  Filled 2024-04-19: qty 2

## 2024-04-19 MED ORDER — ACETAMINOPHEN 500 MG PO TABS
1000.0000 mg | ORAL_TABLET | Freq: Four times a day (QID) | ORAL | Status: DC | PRN
  Administered 2024-04-20: 1000 mg via ORAL
  Filled 2024-04-19: qty 2

## 2024-04-19 MED ORDER — INSULIN ASPART 100 UNIT/ML IJ SOLN
0.0000 [IU] | Freq: Three times a day (TID) | INTRAMUSCULAR | Status: DC
  Administered 2024-04-20: 2 [IU] via SUBCUTANEOUS
  Filled 2024-04-19: qty 2

## 2024-04-19 MED ORDER — ATORVASTATIN CALCIUM 40 MG PO TABS
40.0000 mg | ORAL_TABLET | Freq: Every day | ORAL | Status: DC
  Administered 2024-04-19 – 2024-04-21 (×3): 40 mg via ORAL
  Filled 2024-04-19 (×3): qty 1

## 2024-04-19 MED ORDER — ONDANSETRON HCL 4 MG PO TABS: 4.0000 mg | ORAL_TABLET | Freq: Three times a day (TID) | ORAL | Status: DC | PRN

## 2024-04-19 NOTE — H&P (Signed)
 " Date: 04/19/2024               Patient Name:  [name] MRN: [id number]  DOB: 1966-01-05 Age / Sex: 59 y.o., male   PCP: Harrie Bruckner, DO         Medical Service: Internal Medicine Teaching Service         Attending Physician: Dr. Reyes Fenton      First Contact: Sallyanne Primas, DO    Second Contact: Dr. Bruckner Harrie, DO         Pager Information: First Contact Pager: [phone]   Second Contact Pager: [phone]   SUBJECTIVE   Chief Complaint: Nausea and Vomiting   History of Present Illness: [name] is a 59 y.o. male with PMH of ESRD on HD MWF, type 2 diabetes, hypertension, HFpEF, chronic hep C, depression, degenerative disc disease, malignant neoplasm of prostate (stage IIIc 2024).  Starting last Monday (8 days ago) he experienced nausea midway through dialysis that progressed since then. He has nausea, emesis, and diarrhea and is unable to keep food down. At this point the diarrhea is resolved with most recent episode two days ago but he remains unable to tolerate food. When he was having bowel movements, he thinks his diarrhea included ark and tarry components. He has not tried self-treatment with medicine at home. He has abdominal pain that started in the epigastric region and progressed to the full abdomen due to wretching. He noticed occasional blood in his vomitus but it is not persistent.  He ascribes to occasional chest pain or shortness of breath that he associated with he emesis. He was briefly short of breath in the ER but that resolved very quickly with supplemental oxygen.  During our examination, patient did not require supplemental oxygen.  He says this sort of illness happened before. The most recent time to this extent was 5 years ago and included dry heaving. He denies fevers but has chills and sweats when most ill - on top of recurrent chills for iron deficient anemia. He denies sick contacts. He does not feel like he is swollen. Admits to  smoking weed because it settles his stomach and can't imagine stopping because of the help it provides. He has seen a GI doctor for this and worries that his physicians assume his problem is due to marijuana and don't fully consider other causes. 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 He states that he has many things that he worries about.  He still makes urine and reports no changes. MWF is normal dialysis schedule, last Monday last HD. Recent gastric emptying study normal. Sees GI Dr Abran.  ED Course: Labs significant for  - Lipase: 109 - Anion gap 31, chloride 89, bicarb 17 - Lactic acid 0.6 - Creatinine 18.2 (5 months ago 11.9) - BUN 124 - Hemoglobin 10.9 HCT 32.1, MCV 93 - WBC 7.4 - Urinalysis: >300 protein, negative for ketones or bacteria - Troponin 145-->147 - Hepatitis B surface antibody: Pending Imaging  - Chest x-ray: No active disease Received: Dilaudid  0.5 mg, labetalol  10 mg IV, Zofran  4 mg Consulted: Nephrology  Past Medical History PCP: Harrie Bruckner, DO  ESRD on HD MWF, type 2 diabetes, hypertension, HFpEF, chronic hep C, depression, degenerative disc disease, malignant neoplasm of prostate (stage IIIc 2024).  Meds: Patient reported:  Hydrocodone  5-325 TID (often takes all 3 at night as sleep aid due to pain) Lipitor Carvedilol   Duloxetine  Gabapentin  Insulin  20 u but none since last Monday Pantoprazole  Compazine  Sertraline   Active Medications[1]  Past Surgical History Past Surgical History:  Procedure Laterality Date   A/V SHUNT INTERVENTION N/A 06/29/2023   Procedure: A/V SHUNT INTERVENTION;  Surgeon: Tobie Gordy POUR, MD;  Location: [facility] INVASIVE CV LAB;  Service: Cardiovascular;  Laterality: N/A;   A/V SHUNT INTERVENTION Left 09/23/2023   Procedure: A/V SHUNT INTERVENTION;  Surgeon: Sheree Penne Bruckner, MD;  Location: [facility] PV LAB;  Service: Cardiovascular;  Laterality: Left;    AV FISTULA PLACEMENT Left 09/11/2022   Procedure: LEFT ARM BRACHIOCEPHALIC ARTERIOVENOUS (AV) FISTULA CREATION;  Surgeon: Sheree Penne Bruckner, MD;  Location: [facility] OR;  Service: Vascular;  Laterality: Left;   CATARACT EXTRACTION Bilateral    COLONOSCOPY     KNEE ARTHROSCOPY W/ MENISCAL REPAIR  04/06/1998   left   LUMBAR LAMINECTOMY  04/06/2000   PROSTATE BIOPSY     ROBOT ASSISTED LAPAROSCOPIC RADICAL PROSTATECTOMY N/A 04/15/2023   Procedure: XI ROBOTIC ASSISTED LAPAROSCOPIC RADICAL PROSTATECTOMY LEVEL 2/BILATERAL PELVIC LYMPHADENECTOMY;  Surgeon: Renda Glance, MD;  Location: [facility] ORS;  Service: Urology;  Laterality: N/A;  210 MINUTES NEEDED FOR CASE   VENOUS ANGIOPLASTY Left 06/29/2023   Procedure: VENOUS ANGIOPLASTY;  Surgeon: Tobie Gordy POUR, MD;  Location: [facility] INVASIVE CV LAB;  Service: Cardiovascular;  Laterality: Left;  cephalic arch   VENOUS STENT  3/80/7974   Procedure: VENOUS STENT;  Surgeon: Sheree Penne Bruckner, MD;  Location: [facility] PV LAB;  Service: Cardiovascular;;  cephalic arch    Social:  Lives by self with his dog a 31 year old [employer] system is local friends and family He does not work, is a retired hospital doctor Former cigarette smoker stopped September 24 2008, weed smoking daily  Family History:  Family History  Problem Relation Age of Onset   Cancer Mother        pancres   Diabetes Mother    Hypertension Brother    Renal cancer Brother    Cancer Maternal Uncle    Colon cancer Maternal Uncle    Colon cancer Maternal Uncle    Stomach cancer Maternal Uncle    Esophageal cancer Neg Hx    Rectal cancer Neg Hx      Allergies: Allergies as of 04/19/2024 - Review Complete 04/19/2024  Allergen Reaction Noted   Norvasc  [amlodipine ] Other (See Comments) 11/18/2021   Zestril  [lisinopril ] Swelling and Other (See Comments) 12/17/2021    Review of Systems: A complete ROS was negative except as per HPI.   OBJECTIVE:   Physical Exam: Blood pressure (!)  162/91, pulse 78, temperature 98.5 F (36.9 C), resp. rate 14, SpO2 100%.  Physical Exam Constitutional:      General: He is not in acute distress.    Appearance: He is not ill-appearing, toxic-appearing or diaphoretic.     Comments: Shaking from chills  Cardiovascular:     Rate and Rhythm: Normal rate and regular rhythm.     Heart sounds: Normal heart sounds. No murmur heard.    No friction rub. No gallop.  Pulmonary:     Effort: Pulmonary effort is normal.     Breath sounds: Normal breath sounds. No stridor. No wheezing, rhonchi or rales.  Abdominal:     General: Abdomen is protuberant. Bowel sounds are normal. There is no distension.     Palpations: Abdomen is soft.     Tenderness: There is abdominal tenderness in the epigastric area. There is no guarding or rebound.  Skin:  General: Skin is warm and dry.     Coloration: Skin is not pale.  Neurological:     Mental Status: He is alert.  Psychiatric:        Mood and Affect: Mood normal.        Behavior: Behavior normal.      Labs: CBC    Component Value Date/Time   WBC 7.4 04/19/2024 1451   RBC 3.45 (L) 04/19/2024 1451   HGB 10.9 (L) 04/19/2024 1451   HGB 9.5 (L) 01/08/2022 1501   HCT 32.1 (L) 04/19/2024 1451   HCT 28.6 (L) 01/08/2022 1501   PLT 266 04/19/2024 1451   PLT 131 (L) 01/08/2022 1501   MCV 93.0 04/19/2024 1451   MCV 86 01/08/2022 1501   MCH 31.6 04/19/2024 1451   MCHC 34.0 04/19/2024 1451   RDW 13.7 04/19/2024 1451   RDW 13.2 01/08/2022 1501   LYMPHSABS 3.0 10/31/2023 1205   LYMPHSABS 2.6 05/10/2019 1021   MONOABS 0.7 10/31/2023 1205   EOSABS 0.0 10/31/2023 1205   EOSABS 0.3 05/10/2019 1021   BASOSABS 0.0 10/31/2023 1205   BASOSABS 0.1 05/10/2019 1021     CMP     Component Value Date/Time   NA 137 04/19/2024 1451   NA 139 08/12/2022 1339   K 4.9 04/19/2024 1451   CL 89 (L) 04/19/2024 1451   CO2 17 (L) 04/19/2024 1451   GLUCOSE 120 (H) 04/19/2024 1451   BUN 124 (H) 04/19/2024 1451   BUN  64 (H) 08/12/2022 1339   CREATININE 18.20 (H) 04/19/2024 1451   CREATININE 1.31 11/21/2019 1445   CALCIUM  8.0 (L) 04/19/2024 1451   PROT 8.2 (H) 04/19/2024 1451   PROT 6.9 01/08/2022 1501   ALBUMIN 3.7 04/19/2024 1451   ALBUMIN 3.7 (L) 01/08/2022 1501   AST 17 04/19/2024 1451   ALT 10 04/19/2024 1451   ALT 57 (H) 05/16/2019 0958   ALKPHOS 61 04/19/2024 1451   BILITOT 0.4 04/19/2024 1451   BILITOT <0.2 01/08/2022 1501   GFRNONAA 3 (L) 04/19/2024 1451   GFRNONAA >89 01/29/2016 0925   GFRAA 101 12/06/2019 1357   GFRAA >89 01/29/2016 0925    Imaging: DG Chest Portable 1 View Result Date: 04/19/2024 CLINICAL DATA:  Short of breath, nausea and vomiting EXAM: PORTABLE CHEST 1 VIEW COMPARISON:  10/31/2023 FINDINGS: The heart size and mediastinal contours are within normal limits. Both lungs are clear. The visualized skeletal structures are unremarkable. IMPRESSION: No active disease. Electronically Signed   By: Ozell Daring M.D.   On: 04/19/2024 15:53     EKG: personally reviewed my interpretation is sinus rhythm, borderline prolonged QT (498), no STEMI.  Prior EKG similar  ASSESSMENT & PLAN:   Assessment & Plan by Problem: Active Problems:   Nausea with vomiting   Type 2 diabetes mellitus (HCC)   Essential hypertension   ESRD (end stage renal disease) on dialysis (HCC)   [name] is a 59 y.o. person living with a history of ESRD on HD MWF, type 2 diabetes, hypertension, HFpEF, chronic hep C, depression, degenerative disc disease, malignant neoplasm of prostate (stage IIIc 2024) who presented with nausea and vomiting and admitted for intractable nausea and vomiting on hospital day 0  Nausea/vomiting Diarrhea 2/2 cannabinoid hyperemesis syndrome versus infectious enterocolitis versus uremia Patient reports 8 days of nausea/vomiting followed by diarrhea. Infectious cause unlikely at this time as patient is afebrile, no leukocytosis, negative lactic acid. Pancreatitis unlikely  with only slight elevation in lipase at 109  which was stable from 5 months ago at 106).  Patient is endorsing epigastric pain which is possibly 2/2 retching/dry heaving.  Patient admits to smoking marijuana daily and using it to help settle his stomach.  This is not the first episode of nausea/vomiting/diarrhea with the last episode 5 years ago having occurred without having smoked marijuana.  It is possible that at that time it was infectious.  High suspicion for cannabinoid hyperemesis syndrome given smoking history.  No reported diarrhea for 2 days.  Nausea persisting but improved with antiemesis received in ED. tolerating p.o. Counseled patient on importance of marijuana smoking cessation.  Differential diagnosis also includes uremia, however with symptom onset occurring while having HD and compliant with HD, this feels more unlikely. Will continue to treat symptomatically and additionally hope for resolution after HD.  Patient also currently on sertraline  100 mg daily and duloxetine  20 mg daily.  Can consider transition to tricyclic antidepressant. - Haldol  0.5 mg as needed for nausea - Capsicum cream twice daily ordered - Consider tricyclic antidepressant (caution for borderline QT)  Hematochezia Melena Chronic iron deficiency anemia History of hiatal hernia Patient described several episodes of hematochezia which self resolved followed by some instances of melena.  Patient also has a history of reported hiatal hernia and likely esophagitis (CT abdomen pelvis with findings suspicious for esophagitis such as wall thickening and mediastinal changes.).  Patient reports that this has occurred previously after severe retching/dry heaving.  Melena likely 2/2 hematochezia which is possibly 2/2 Mallory-Weiss tear.  Patient stated that he has an EGD scheduled for February.  Patient also reporting chronic chills which have been worsening past couple days.  He reports that he always feels cold.  Patient seen  visibly shaking during examination which resolved slightly with warm blanket.  Temperature on admission 98.5.  Patient receives iron infusions outpatient.  Hemoglobin appears stable at 10.9. - Encouraged follow-up outpatient with GI - Monitor with CBC - Continue pantoprazole  20 mg daily  Anion gap metabolic acidosis Azotemia ESRD on HD MWF Patient has missed several dialysis sessions due to nausea/vomiting/diarrhea.  Endorsed some instances of chest pain/shortness of breath although this has resolved.  Patient denies orthopnea, confusion, muscle cramps, swelling but is endorsing nausea/vomiting.  Patient is euvolemic on exam.  Chest x-ray unremarkable.  Patient denies nephrology was consulted and patient will be dialyzed tonight.  Will continue to monitor labs for improvement of anion gap metabolic acidosis. - Dialysis 1/14 - Continue binders - Follow-up RFP  Hypertensive urgency Blood pressure 205/102-->162/91.  Due to inability to tolerate p.o. intake, patient has not been able to take blood pressure medications.  Patient denies headache, chest pain, shortness of breath, changes in vision during examination.  No signs of new endorgan damage.  Received labetalol  10 mg in ED. - Resume home blood pressure medication carvedilol  25 mg - Dialysis 1/14  Type 2 diabetes Last A1c 04/08/2023 6.5.  Home medications include 20 units subcu Lantus  daily.  With increasing diet, can order long-acting insulin  daily. 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  AST/ALT WNL. - Continue workup during hospitalization - Hepatitis B surface antibody pending  Chronic pain History of back surgeries Restless leg Patient reports that after several back surgeries several years ago he has now been taking hydrocodone -acetaminophen  5-325 mg nightly for 1 year.  He reports that the prescription is for 3 times  daily as needed, however he is taking all 3 tablets at night to help him sleep.  He states that the pain in his back and legs make it to where he cannot sleep.  Patient also has a history of iron deficiency anemia and restless leg could be contributing.  Counseled patient on the dangers of this habit and the changes that would need to be made during this hospitalization.  Patient stated understanding.  Patient also taking gabapentin  100 mg daily at bedtime. - Melatonin 3 mg ordered - Continue follow-up with iron infusion outpatient - Continue gabapentin  100 mg nightly bedtime - Continue duloxetine  20 mg daily - Hydrocodone -acetaminophen  5-325 mg 1 tablet as needed - Tylenol  as needed  Cannabinoid use disorder Patient reports smoking marijuana daily.  He states that he does this because it settles the stomach but also he has anxiety and is worried about a lot of things.  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  He states that he thinks he can quit again especially if his symptoms are related to his marijuana use.  Patient was counseled extensively on benefits of stopping marijuana smoking especially in setting of cannabinoid hyperemesis syndrome.  Hyperlipidemia - Continue atorvastatin  40 mg  HFpEF Euvolemic.  Chest x-ray unremarkable.  Best practice: Diet: Renal VTE: Heparin  IVF: None,None Code: Full  Disposition planning: Prior to Admission Living Arrangement: Home, living alone Anticipated Discharge Location: Home  Dispo: Admit patient to Observation with expected length of stay less than 2 midnights.  Signed: Benuel Braun, DO Internal Medicine Resident  04/19/2024, 6:07 PM  On Call pager: [phone]     [1]  No outpatient medications have been marked as taking for the 04/19/24 encounter [facility] Encounter).   "

## 2024-04-19 NOTE — Progress Notes (Signed)
 Received patient in bed to unit.  Alert and oriented x4 Informed consent signed and in chart.   TX duration:1.40  Patient dizzy and nauseous wanted to be disconnected depside educating pt. Still requested to be disconnected  Transported back to the room  Alert, without acute distress.  Hand-off given to patient's nurse.   Access used: avf Access issues: none  Total UF removed: 1300 Medication(s) given: heparin  3000 units bolus  Post HD VS: see table below Post HD weight: 92.3kg   04/19/24 2229  Vitals  Temp 99.6 F (37.6 C)  Temp Source Oral  BP (!) 173/66  MAP (mmHg) 92  BP Location Right Arm  BP Method Automatic  Patient Position (if appropriate) Lying  Pulse Rate 97  Pulse Rate Source Monitor  ECG Heart Rate 97  Resp 11  Weight 92.3 kg  Type of Weight Post-Dialysis  Oxygen Therapy  SpO2 99 %  O2 Device Room Air  Patient Activity (if Appropriate) In bed  Pulse Oximetry Type Continuous  During Treatment Monitoring  Blood Flow Rate (mL/min) 0 mL/min  Arterial Pressure (mmHg) -1.41 mmHg  Venous Pressure (mmHg) -2.63 mmHg  TMP (mmHg) -52.12 mmHg  Ultrafiltration Rate (mL/min) 0 mL/min  Dialysate Flow Rate (mL/min) 299 ml/min  Duration of HD Treatment -hour(s) 1.67 hour(s)  Cumulative Fluid Removed (mL) per Treatment  1346.12  Post Treatment  Dialyzer Clearance Clear  Hemodialysis Intake (mL) 200 mL  Liters Processed 40.1  Fluid Removed (mL) 1300 mL  Tolerated HD Treatment No (Comment)  Post-Hemodialysis Comments goal not met'  AVG/AVF Arterial Site Held (minutes) 5 minutes  AVG/AVF Venous Site Held (minutes) 5 minutes  Fistula / Graft Left Upper arm Arteriovenous fistula  Placement Date/Time: 09/11/22 1056   Placed prior to admission: No  Orientation: Left  Access Location: Upper arm  Access Type: Arteriovenous fistula  Site Condition No complications  Fistula / Graft Assessment Thrill;Present;Bruit  Status Deaccessed;Flushed;Patent      Lorrene KANDICE [facility]

## 2024-04-19 NOTE — Telephone Encounter (Signed)
 Called pt to schedule his next appt ; stated he's not feeling well and going to the ER via EMS for problems w/his stomach (EMS just arrived).

## 2024-04-19 NOTE — ED Provider Notes (Signed)
 " [name] Provider Note   CSN: [id number] Arrival date & time: 04/19/24  1437     Patient presents with: Abdominal Pain and Shortness of Breath   [name] is a 59 y.o. male.   The history is provided by the patient and medical records. No language interpreter was used.  Abdominal Pain Pain location:  Generalized Pain quality: cramping   Pain radiates to:  Chest Pain severity:  Moderate Onset quality:  Gradual Duration:  1 week Timing:  Intermittent Progression:  Waxing and waning Context: retching   Relieved by:  Nothing Worsened by:  Nothing Ineffective treatments:  None tried Associated symptoms: diarrhea, fatigue, nausea, shortness of breath and vomiting   Associated symptoms: no chest pain, no chills, no constipation, no cough, no dysuria and no fever   Shortness of Breath Severity:  Moderate Onset quality:  Gradual Duration:  1 week Timing:  Intermittent Progression:  Waxing and waning Chronicity:  New Relieved by:  Nothing Worsened by:  Nothing Associated symptoms: abdominal pain and vomiting   Associated symptoms: no chest pain, no cough, no fever, no headaches, no neck pain, no rash and no wheezing        Prior to Admission medications  Medication Sig Start Date End Date Taking? Authorizing Provider  atorvastatin  (LIPITOR) 40 MG tablet Take 1 tablet (40 mg total) by mouth daily. 11/30/23   Harrie Bruckner, DO  bumetanide  (BUMEX ) 2 MG tablet Take 2 tablets (4 mg total) by mouth 2 (two) times daily. Patient not taking: Reported on 12/28/2023 09/03/22   Sridharan, Sriramkumar, MD  CALCITRIOL  PO Take by mouth. 05/17/23 05/15/24  [provider]  carvedilol  (COREG ) 25 MG tablet Take 25 mg by mouth 2 (two) times daily with a meal.    [provider]  Cinacalcet HCl (SENSIPAR PO) Take by mouth. 06/11/23 06/07/24  [provider]  Continuous Glucose Receiver (FREESTYLE LIBRE 3 READER) DEVI  Use with freestyle libre 3 plus sensors to monitor your blood sugar continuously 06/29/23   Harrie Bruckner, DO  Continuous Glucose Sensor (FREESTYLE LIBRE 3 PLUS SENSOR) MISC Change sensor every 15 days. 06/29/23   Juberg, Ramesh Moan, DO  diclofenac  Sodium (VOLTAREN ) 1 % GEL Apply 4 g topically 4 (four) times daily. 12/14/23   Harrie Bruckner, DO  doxycycline  (VIBRA -TABS) 100 MG tablet Take 1 tablet (100 mg total) by mouth 2 (two) times daily. 07/15/23   Gershon Donnice SAUNDERS, DPM  DULoxetine  (CYMBALTA ) 20 MG capsule TAKE 1 CAPSULE BY MOUTH DAILY 04/19/24   Harrie Bruckner, DO  feeding supplement (BOOST HIGH PROTEIN) LIQD Take 1 Container by mouth daily as needed (Supplement).    [provider]  gabapentin  (NEURONTIN ) 100 MG capsule Take 1 capsule (100 mg total) by mouth at bedtime. 01/18/24   Harrie Bruckner, DO  Insulin  Pen Needle (PEN NEEDLES) 32G X 4 MM MISC 1 Needle by Does not apply route daily at 6 (six) AM. 02/17/22   Jinwala, Sagar H, MD  iron sucrose (VENOFER) 20 MG/ML injection Iron Sucrose (Venofer) 05/12/23 05/10/24  [provider]  LANTUS  SOLOSTAR 100 UNIT/ML Solostar Pen Inject 20 Units into the skin daily. 02/22/24   Harrie Bruckner, DO  pantoprazole  (PROTONIX ) 20 MG tablet Take 2 tablets (40 mg total) by mouth daily. 01/25/24   Harrie Bruckner, DO  prochlorperazine  (COMPAZINE ) 5 MG tablet Take 1 tablet (5 mg total) by mouth every 6 (six) hours as needed for nausea or vomiting. 12/01/23  Harrie Bruckner, DO  sertraline  (ZOLOFT ) 100 MG tablet TAKE 1 TABLET BY MOUTH DAILY 09/08/23   Harrie Bruckner, DO  sevelamer  carbonate (RENVELA ) 800 MG tablet Take 1,600-2,400 mg by mouth 3 (three) times daily with meals. 800 mg -1600 mg with snack    [provider]  silver  sulfADIAZINE  (SILVADENE ) 1 % cream Apply 1 Application topically daily. 07/15/23   Gershon Donnice SAUNDERS, DPM    Allergies: Norvasc  [amlodipine ] and Zestril  [lisinopril ]    Review of  Systems  Constitutional:  Positive for fatigue. Negative for chills and fever.  HENT:  Negative for congestion.   Respiratory:  Positive for chest tightness and shortness of breath. Negative for cough and wheezing.   Cardiovascular:  Negative for chest pain, palpitations and leg swelling.  Gastrointestinal:  Positive for abdominal pain, diarrhea, nausea and vomiting. Negative for constipation.  Genitourinary:  Negative for dysuria and flank pain.  Musculoskeletal:  Negative for back pain, neck pain and neck stiffness.  Skin:  Negative for rash and wound.  Neurological:  Negative for weakness, light-headedness, numbness and headaches.  Psychiatric/Behavioral:  Negative for agitation and confusion.   All other systems reviewed and are negative.   Updated Vital Signs BP (!) 205/102 (BP Location: Right Arm)   Pulse (!) 101   Temp 98.5 F (36.9 C)   Resp 18   SpO2 99%   Physical Exam Vitals and nursing note reviewed.  Constitutional:      General: He is not in acute distress.    Appearance: He is well-developed. He is not ill-appearing, toxic-appearing or diaphoretic.  HENT:     Head: Normocephalic and atraumatic.     Mouth/Throat:     Mouth: Mucous membranes are moist.  Eyes:     Extraocular Movements: Extraocular movements intact.     Conjunctiva/sclera: Conjunctivae normal.     Pupils: Pupils are equal, round, and reactive to light.  Cardiovascular:     Rate and Rhythm: Regular rhythm. Tachycardia present.     Heart sounds: No murmur heard. Pulmonary:     Effort: Pulmonary effort is normal. Tachypnea present. No respiratory distress.     Breath sounds: Rales present.  Chest:     Chest wall: Tenderness present.  Abdominal:     General: Abdomen is flat.     Palpations: Abdomen is soft.     Tenderness: There is no abdominal tenderness.  Musculoskeletal:        General: No swelling.     Cervical back: Neck supple.     Right lower leg: No edema.     Left lower leg: No  edema.  Skin:    General: Skin is warm and dry.     Capillary Refill: Capillary refill takes less than 2 seconds.     Findings: No erythema.  Neurological:     Mental Status: He is alert.  Psychiatric:        Mood and Affect: Mood normal.     (all labs ordered are listed, but only abnormal results are displayed) Labs Reviewed  LIPASE, BLOOD - Abnormal; Notable for the following components:      Result Value   Lipase 109 (*)    All other components within normal limits  COMPREHENSIVE METABOLIC PANEL WITH GFR - Abnormal; Notable for the following components:   Chloride 89 (*)    CO2 17 (*)    Glucose, Bld 120 (*)    BUN 124 (*)    Creatinine, Ser 18.20 (*)    Calcium   8.0 (*)    Total Protein 8.2 (*)    GFR, Estimated 3 (*)    Anion gap 31 (*)    All other components within normal limits  CBC - Abnormal; Notable for the following components:   RBC 3.45 (*)    Hemoglobin 10.9 (*)    HCT 32.1 (*)    All other components within normal limits  URINALYSIS, ROUTINE W REFLEX MICROSCOPIC - Abnormal; Notable for the following components:   Glucose, UA 50 (*)    Hgb urine dipstick SMALL (*)    Protein, ur >=300 (*)    All other components within normal limits  I-STAT CG4 LACTIC ACID, ED  TROPONIN T, HIGH SENSITIVITY    EKG: EKG Interpretation Date/Time:  Wednesday April 19 2024 15:15:58 EST Ventricular Rate:  95 PR Interval:  148 QRS Duration:  90 QT Interval:  396 QTC Calculation: 498 R Axis:   60  Text Interpretation: Sinus rhythm Consider left ventricular hypertrophy ST elevation, consider anterior injury Borderline prolonged QT interval Baseline wander in lead(s) V2 when compared to prior, sharper t waves from last summer ECG No STEMI Confirmed by Ginger Barefoot ([id number]) on 04/19/2024 3:19:42 PM  Radiology: [facility] Chest Portable 1 View Result Date: 04/19/2024 CLINICAL DATA:  Short of breath, nausea and vomiting EXAM: PORTABLE CHEST 1 VIEW COMPARISON:  10/31/2023 FINDINGS:  The heart size and mediastinal contours are within normal limits. Both lungs are clear. The visualized skeletal structures are unremarkable. IMPRESSION: No active disease. Electronically Signed   By: Ozell Daring M.D.   On: 04/19/2024 15:53     Procedures   CRITICAL CARE Performed by: Lonni PARAS Anjeli Casad Total critical care time: 20 minutes Critical care time was exclusive of separately billable procedures and treating other patients. Critical care was necessary to treat or prevent imminent or life-threatening deterioration. Critical care was time spent personally by me on the following activities: development of treatment plan with patient and/or surrogate as well as nursing, discussions with consultants, evaluation of patient's response to treatment, examination of patient, obtaining history from patient or surrogate, ordering and performing treatments and interventions, ordering and review of laboratory studies, ordering and review of radiographic studies, pulse oximetry and re-evaluation of patient's condition.  Medications Ordered in the ED  labetalol  (NORMODYNE ) injection 10 mg (10 mg Intravenous Given 04/19/24 1555)  ondansetron  (ZOFRAN ) injection 4 mg (4 mg Intravenous Given 04/19/24 1554)  HYDROmorphone  (DILAUDID ) injection 0.5 mg (0.5 mg Intravenous Given 04/19/24 1554)                                    Medical Decision Making Amount and/or Complexity of Data Reviewed Labs: ordered. Radiology: ordered.  Risk Prescription drug management. Decision regarding hospitalization.    [name] is a 59 y.o. male with past medical history significant for ESRD on dialysis MWF, hypertension, hyperlipidemia, diabetes, CHF, previous prostate cancer, GERD, seizures, anxiety, and depression who presents with exertional shortness of breath, lightheadedness, chest tightness, abdominal cramping, nausea, vomiting, diarrhea, and missing dialysis.  According to patient, for the last week  and a half or so he has been having GI symptoms with significant nausea, vomiting, and diarrhea that is preventing him from taking his home medications for the last few days and is also prevented him from going to dialysis.  He has missed 4 treatments and he does not want to miss treatments normally.  He reports he  feels fluid overloaded with fluid in his lungs and he has difficulty breathing.  Is exertional and he has lightheadedness.  He reports some abdominal cramping and chest tightness.  He reports it feels like when he has fluid.  He reports no dysuria and his urine has not been different.  Denies trauma.  Is not reporting significant pain in his head or neck or back at this time.  He reports no fevers, chills, congestion, or cough and denies any URI symptoms.  He says he does not want to get fluid in his legs.  On arrival, patient's blood pressure is over 200 systolic and he is tachycardic.  He is afebrile.  He is on 2 L nasal cannula for shortness of breath that he was having and he is feeling better with the oxygen.  Is unclear if he had true hypoxia earlier or not.  On my exam, he has diffuse rales in all lung fields.  I did not hear wheezing or rhonchi initially.  Chest is slightly tender to palpation and abdomen was not focally tender.  I heard active bowels.  Back and flanks nontender.  Legs are not critically edematous.  Has intact pulses.   EKG does not show STEMI but does show sharp T waves concerning for possible hyperkalemia.  Will get labs and chest x-ray given his shortness of breath.  I am concerned about fluid overload in the lungs causing his short of breath.  Will give him some blood pressure medicine, some pain and nausea medicine, and will get his workup started.  Anticipate he will likely need admission and dialysis for further management of his symptoms due to missing dialysis and likely GI losses from a suspected viral infection.  Given his lack of significant tenderness on exam  his abdomen pelvis will hold on CT imaging initially.  He is denying pleuritic chest discomfort and it is more tender on the outside and tightness and burning, doubt PE at this time.   4:10 PM Spoke to nephrology who does say the patient will get dialysis tonight and will need admission to the hospital to medicine service.  When labs have returned we will call medicine for admission.     Final diagnoses:  ESRD needing dialysis (HCC)  Shortness of breath  Nausea vomiting and diarrhea      Clinical Impression: 1. ESRD needing dialysis (HCC)   2. Shortness of breath   3. Nausea vomiting and diarrhea     Disposition: Admit  This note was prepared with assistance of Dragon voice recognition software. Occasional wrong-word or sound-a-like substitutions may have occurred due to the inherent limitations of voice recognition software.      Acacia Latorre, Lonni PARAS, MD 04/19/24 [phone]  "

## 2024-04-19 NOTE — ED Notes (Signed)
Pt transporting to dialysis

## 2024-04-19 NOTE — Hospital Course (Addendum)
 Needs dialysis Getting everything sorted out 1.5 week N/V/D missed 4 HD sessions Called Nephrology For a treatment or two    Starting last Monday (8 days ago) he experienced nausea midway through dialysis that progressed since then. He has nausea, emesis, and diarrhea and is unable to keep food down. At this point the diarrhea is resolved with most recent episode two days ago but he remains unable to tolerate food. When he was having bowel movements, he thinks his diarrhea included ark and tarry components. He has not tried self-treatment with medicine at home. He has abdominal pain that started in the epigastric region and progressed to the full abdomen due to wretching. He noticed occasional blood in his vomitus but it is not persistent.  He ascribes to occasional chest pain or shortness of breath that he associated with he emesis. He was briefly short of breath in the ER but that resolved very quickly with supplemental oxygen.  He says this sort of illness happened before. The most recent time was 5 years ago and included dry heaving.   He denies fevers but has chills and sweats when most ill - on top of recurrent chills for iron deficient anemia. He denies sick contacts. He does not feel like he is swollen.  States admits to smoking weed because it settles his stomach and can't imagine stopping because of the help it provides. He has seen a GI doctor for this and worries that his physicians my assume his problem is due to marijuana and don't fully consider other causes. 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 He states that he has many things that he worries about.  Several episodes dark stool that was tarry.     He still makes urine and reports no changes.  MWF is normal dialysis schedule, last Monday last HD.  Recent gastric emptying study normal. Sees GI Dr Abran Agreste cannabis hyperemesis vs infection vs  uremia (not really) vs pancreatitis, Anxiety, Restless leg/IDA/Chills, chronic pain, HTN, Diabetus, troponinemia    Is interested in alternative to marijuana for his stomach/anx symptoms.  Lives by self with his dog a 57 year old Shitzu yorkie [employer] system is local friends and family He does not work, is a retired hospital doctor Former cigarette smoker stopped September 24 2008, weed smoking daily  No bumex  No doxy  Hydrocodone  5-325 TID (often takes all 3 at night as sleep aid due to pain) Lipitor Carvedilol  Duloxetine  Gabapentin  Insulin  20 u but none since last Monday Pantoprazole  Compazine  Sertraline   FULL CODE

## 2024-04-19 NOTE — ED Triage Notes (Signed)
 Pt arrived via [facility] from home for c/o epigastric pain, N/V/D, [id number]. Pt dialysis pt M, W, Fri. Last dialysis was 1/5 due to the sickness. Pt last took meds on Monday. EMS placed pt on 2L 02 per [facility]

## 2024-04-19 NOTE — Consult Note (Signed)
  [facility] Renal Consultation Note    Indication for Consultation:  Management of ESRD/hemodialysis; anemia, hypertension/volume and secondary hyperparathyroidism  ERE:Glazmh, Lonni, DO  HPI: [name] is a 59 y.o. male with ESRD on HD MWF at [facility]. He has a past medical history significant for T2DM, HTN, and chronic diastolic CHF.  Seen and examined patient at bedside in the ED. He reports feeling unwell lately which has caused him to miss his HD treatments lately. His last HD was on 04/10/24. He reports dealing with N/V, sneezing, coughing, and dry heaves for the past 1.5 weeks. He denies fevers but endorses chills. Currently afebrile and on O2. He reports his breathing is okay right now and not in acute distress. Current troponin is 145. Other labs are stable. Plan for HD tonight.  Past Medical History:  Diagnosis Date   Acute exacerbation of CHF (congestive heart failure) (HCC) 12/31/2021   Anxiety    Panic Atacks   Arthritis    Carpal tunnel syndrome    Chronic kidney disease    Constipation    Depression    Diabetes mellitus    DMII   Dysrhythmia    hx tachy hr   GERD (gastroesophageal reflux disease)    no problem in past few years.   Heart disease    Heart murmur    Hepatitis    Hyperlipidemia    Hypertension    Nausea and vomiting 11/12/2022   Neuropathy associated with endocrine disorder    Seizures (HCC)    stress related no more since taking Klonopin  2 years- See Neurologist  [facility]   Shortness of breath dyspnea    with exertion   Sleep apnea    used a cpap when he was 350lb-does not need one now 260lb   Wears contact lenses    Past Surgical History:  Procedure Laterality Date   A/V SHUNT INTERVENTION N/A 06/29/2023   Procedure: A/V SHUNT INTERVENTION;  Surgeon: Tobie Gordy POUR, MD;  Location: [facility] INVASIVE CV LAB;  Service: Cardiovascular;  Laterality: N/A;   A/V SHUNT INTERVENTION Left 09/23/2023   Procedure:  A/V SHUNT INTERVENTION;  Surgeon: Sheree Penne Lonni, MD;  Location: [facility] PV LAB;  Service: Cardiovascular;  Laterality: Left;   AV FISTULA PLACEMENT Left 09/11/2022   Procedure: LEFT ARM BRACHIOCEPHALIC ARTERIOVENOUS (AV) FISTULA CREATION;  Surgeon: Sheree Penne Lonni, MD;  Location: [facility] OR;  Service: Vascular;  Laterality: Left;   CATARACT EXTRACTION Bilateral    COLONOSCOPY     KNEE ARTHROSCOPY W/ MENISCAL REPAIR  04/06/1998   left   LUMBAR LAMINECTOMY  04/06/2000   PROSTATE BIOPSY     ROBOT ASSISTED LAPAROSCOPIC RADICAL PROSTATECTOMY N/A 04/15/2023   Procedure: XI ROBOTIC ASSISTED LAPAROSCOPIC RADICAL PROSTATECTOMY LEVEL 2/BILATERAL PELVIC LYMPHADENECTOMY;  Surgeon: Renda Glance, MD;  Location: [facility] ORS;  Service: Urology;  Laterality: N/A;  210 MINUTES NEEDED FOR CASE   VENOUS ANGIOPLASTY Left 06/29/2023   Procedure: VENOUS ANGIOPLASTY;  Surgeon: Tobie Gordy POUR, MD;  Location: [facility] INVASIVE CV LAB;  Service: Cardiovascular;  Laterality: Left;  cephalic arch   VENOUS STENT  3/80/7974   Procedure: VENOUS STENT;  Surgeon: Sheree Penne Lonni, MD;  Location: [facility] PV LAB;  Service: Cardiovascular;;  cephalic arch   Family History  Problem Relation Age of Onset   Cancer Mother        pancres   Diabetes Mother    Hypertension Brother    Renal cancer Brother    Cancer Maternal Uncle  Colon cancer Maternal Uncle    Colon cancer Maternal Uncle    Stomach cancer Maternal Uncle    Esophageal cancer Neg Hx    Rectal cancer Neg Hx    Social History:  reports that he quit smoking about 15 years ago. His smoking use included cigarettes. He has never used smokeless tobacco. He reports that he does not currently use alcohol. He reports current drug use. Frequency: 7.00 times per week. Drug: Marijuana. Allergies[1] Prior to Admission medications  Medication Sig Start Date End Date Taking? Authorizing Provider  atorvastatin  (LIPITOR) 40 MG tablet Take 1 tablet (40 mg total) by mouth  daily. 11/30/23   Harrie Bruckner, DO  bumetanide  (BUMEX ) 2 MG tablet Take 2 tablets (4 mg total) by mouth 2 (two) times daily. Patient not taking: Reported on 12/28/2023 09/03/22   Sridharan, Sriramkumar, MD  CALCITRIOL  PO Take by mouth. 05/17/23 05/15/24  [provider]  carvedilol  (COREG ) 25 MG tablet Take 25 mg by mouth 2 (two) times daily with a meal.    [provider]  Cinacalcet HCl (SENSIPAR PO) Take by mouth. 06/11/23 06/07/24  [provider]  Continuous Glucose Receiver (FREESTYLE LIBRE 3 READER) DEVI Use with freestyle libre 3 plus sensors to monitor your blood sugar continuously 06/29/23   Harrie Bruckner, DO  Continuous Glucose Sensor (FREESTYLE LIBRE 3 PLUS SENSOR) MISC Change sensor every 15 days. 06/29/23   Harrie Bruckner, DO  diclofenac  Sodium (VOLTAREN ) 1 % GEL Apply 4 g topically 4 (four) times daily. 12/14/23   Harrie Bruckner, DO  doxycycline  (VIBRA -TABS) 100 MG tablet Take 1 tablet (100 mg total) by mouth 2 (two) times daily. 07/15/23   Gershon Donnice SAUNDERS, DPM  DULoxetine  (CYMBALTA ) 20 MG capsule TAKE 1 CAPSULE BY MOUTH DAILY 04/19/24   Harrie Bruckner, DO  feeding supplement (BOOST HIGH PROTEIN) LIQD Take 1 Container by mouth daily as needed (Supplement).    [provider]  gabapentin  (NEURONTIN ) 100 MG capsule Take 1 capsule (100 mg total) by mouth at bedtime. 01/18/24   Harrie Bruckner, DO  Insulin  Pen Needle (PEN NEEDLES) 32G X 4 MM MISC 1 Needle by Does not apply route daily at 6 (six) AM. 02/17/22   Jinwala, Sagar H, MD  iron sucrose (VENOFER) 20 MG/ML injection Iron Sucrose (Venofer) 05/12/23 05/10/24  [provider]  LANTUS  SOLOSTAR 100 UNIT/ML Solostar Pen Inject 20 Units into the skin daily. 02/22/24   Harrie Bruckner, DO  pantoprazole  (PROTONIX ) 20 MG tablet Take 2 tablets (40 mg total) by mouth daily. 01/25/24   Harrie Bruckner, DO  prochlorperazine  (COMPAZINE ) 5 MG tablet Take 1 tablet (5 mg total) by  mouth every 6 (six) hours as needed for nausea or vomiting. 12/01/23   Harrie Bruckner, DO  sertraline  (ZOLOFT ) 100 MG tablet TAKE 1 TABLET BY MOUTH DAILY 09/08/23   Harrie Bruckner, DO  sevelamer  carbonate (RENVELA ) 800 MG tablet Take 1,600-2,400 mg by mouth 3 (three) times daily with meals. 800 mg -1600 mg with snack    [provider]  silver  sulfADIAZINE  (SILVADENE ) 1 % cream Apply 1 Application topically daily. 07/15/23   Gershon Donnice SAUNDERS, DPM   Current Facility-Administered Medications  Medication Dose Route Frequency Provider Last Rate Last Admin   alteplase  (CATHFLO ACTIVASE ) injection 2 mg  2 mg Intracatheter Once PRN Montzerrat Brunell E, NP       [START ON 04/20/2024] Chlorhexidine  Gluconate Cloth 2 % PADS 6 each  6 each Topical Q0600 Lenon Charmaine BRAVO, NP  heparin  injection 1,000 Units  1,000 Units Dialysis PRN Lenon Charmaine BRAVO, NP       lidocaine  (PF) (XYLOCAINE ) 1 % injection 5 mL  5 mL Intradermal PRN Lenon Charmaine BRAVO, NP       lidocaine -prilocaine  (EMLA ) cream 1 Application  1 Application Topical PRN Lenon Charmaine BRAVO, NP       pentafluoroprop-tetrafluoroeth (GEBAUERS) aerosol 1 Application  1 Application Topical PRN Lenon Charmaine BRAVO, NP       Current Outpatient Medications  Medication Sig Dispense Refill   atorvastatin  (LIPITOR) 40 MG tablet Take 1 tablet (40 mg total) by mouth daily. 90 tablet 2   bumetanide  (BUMEX ) 2 MG tablet Take 2 tablets (4 mg total) by mouth 2 (two) times daily. (Patient not taking: Reported on 12/28/2023) 120 tablet 0   CALCITRIOL  PO Take by mouth.     carvedilol  (COREG ) 25 MG tablet Take 25 mg by mouth 2 (two) times daily with a meal.     Cinacalcet HCl (SENSIPAR PO) Take by mouth.     Continuous Glucose Receiver (FREESTYLE LIBRE 3 READER) DEVI Use with freestyle libre 3 plus sensors to monitor your blood sugar continuously 1 each 1   Continuous Glucose Sensor (FREESTYLE LIBRE 3 PLUS SENSOR) MISC Change sensor every  15 days. 6 each 3   diclofenac  Sodium (VOLTAREN ) 1 % GEL Apply 4 g topically 4 (four) times daily. 150 g 3   doxycycline  (VIBRA -TABS) 100 MG tablet Take 1 tablet (100 mg total) by mouth 2 (two) times daily. 20 tablet 0   DULoxetine  (CYMBALTA ) 20 MG capsule TAKE 1 CAPSULE BY MOUTH DAILY 30 capsule 0   feeding supplement (BOOST HIGH PROTEIN) LIQD Take 1 Container by mouth daily as needed (Supplement).     gabapentin  (NEURONTIN ) 100 MG capsule Take 1 capsule (100 mg total) by mouth at bedtime. 90 capsule 0   Insulin  Pen Needle (PEN NEEDLES) 32G X 4 MM MISC 1 Needle by Does not apply route daily at 6 (six) AM. 200 each 1   iron sucrose (VENOFER) 20 MG/ML injection Iron Sucrose (Venofer)     LANTUS  SOLOSTAR 100 UNIT/ML Solostar Pen Inject 20 Units into the skin daily. 15 mL 1   pantoprazole  (PROTONIX ) 20 MG tablet Take 2 tablets (40 mg total) by mouth daily. 60 tablet 0   prochlorperazine  (COMPAZINE ) 5 MG tablet Take 1 tablet (5 mg total) by mouth every 6 (six) hours as needed for nausea or vomiting. 30 tablet 6   sertraline  (ZOLOFT ) 100 MG tablet TAKE 1 TABLET BY MOUTH DAILY 30 tablet 0   sevelamer  carbonate (RENVELA ) 800 MG tablet Take 1,600-2,400 mg by mouth 3 (three) times daily with meals. 800 mg -1600 mg with snack     silver  sulfADIAZINE  (SILVADENE ) 1 % cream Apply 1 Application topically daily. 50 g 0   Labs: Basic Metabolic Panel: Recent Labs  Lab 04/19/24 1451  NA 137  K 4.9  CL 89*  CO2 17*  GLUCOSE 120*  BUN 124*  CREATININE 18.20*  CALCIUM  8.0*   Liver Function Tests: Recent Labs  Lab 04/19/24 1451  AST 17  ALT 10  ALKPHOS 61  BILITOT 0.4  PROT 8.2*  ALBUMIN 3.7   Recent Labs  Lab 04/19/24 1451  LIPASE 109*   No results for input(s): AMMONIA in the last 168 hours. CBC: Recent Labs  Lab 04/19/24 1451  WBC 7.4  HGB 10.9*  HCT 32.1*  MCV 93.0  PLT 266   Cardiac Enzymes: No results  for input(s): CKTOTAL, CKMB, CKMBINDEX, TROPONINI in the last 168  hours. CBG: No results for input(s): GLUCAP in the last 168 hours. Iron Studies: No results for input(s): IRON, TIBC, TRANSFERRIN, FERRITIN in the last 72 hours. Studies/Results: DG Chest Portable 1 View Result Date: 04/19/2024 CLINICAL DATA:  Short of breath, nausea and vomiting EXAM: PORTABLE CHEST 1 VIEW COMPARISON:  10/31/2023 FINDINGS: The heart size and mediastinal contours are within normal limits. Both lungs are clear. The visualized skeletal structures are unremarkable. IMPRESSION: No active disease. Electronically Signed   By: Ozell Daring M.D.   On: 04/19/2024 15:53    ROS: All others negative except those listed in HPI.   Physical Exam: Vitals:   04/19/24 1447 04/19/24 1530 04/19/24 1545 04/19/24 1615  BP: (!) 205/102 (!) 187/90 (!) 179/89 (!) 162/91  Pulse: (!) 101 96 98 78  Resp: 18 14 (!) 25 14  Temp: 98.5 F (36.9 C)     SpO2: 99% 100% 100% 100%     General: Awake, alert, on O2, NAD, noted mild rigors Head: NCAT sclera not icteric  Lungs: Diminished at bases, clear in uppers. No wheeze, rales or rhonchi. Breathing is unlabored. Heart: RRR. No murmur, rubs or gallops.  Abdomen: soft and non-tender Lower extremities: No LE edema Neuro: AAOx3. Moves all extremities spontaneously. Dialysis Access: L AVF  Dialysis Orders:  MWF - [facility] 4hrs, BFR 450, DFR 800,  EDW 92kg, 2K/ 2.5Ca Heparin  3000 unit bolus with HD Mircera 75 mcg q2wks - dose just raised Calcitriol  2mcg PO qHD - last dose 04/10/24 Sensipar 30mg  with HD - last dose 04/10/24  Hgb 10.9, K 4.9, Ca 8.0, Alb 3.7  Assessment/Plan: URI symptoms - feeling unwell for the past 1.5 weeks causing to miss dialysis (N/V, dry heaves, sneezing, coughing). May warrant getting resp viral panel since flu rates are currently high ESRD - on HD MWF. Last HD 1/5. Plan for HD tonight. UFG set 3-4L Hypertension/volume  - BP up likely 2nd missed HD. Push UF as tolerated Anemia of CKD - Hgb 10.9. No  ESA/Fe needed at this time. Resume ESA if he remains inpatient Secondary Hyperparathyroidism -  Check phos in AM Nutrition - Renal diet with fluid restriction  Charmaine Piety, NP [facility] 04/19/2024, 5:08 PM      [1]  Allergies Allergen Reactions   Norvasc  [Amlodipine ] Other (See Comments)    Bilateral leg swelling   Zestril  [Lisinopril ] Swelling and Other (See Comments)    Face and legs swell

## 2024-04-20 ENCOUNTER — Ambulatory Visit

## 2024-04-20 ENCOUNTER — Encounter (HOSPITAL_COMMUNITY): Payer: Self-pay | Admitting: Infectious Diseases

## 2024-04-20 DIAGNOSIS — R1116 Cannabis hyperemesis syndrome: Secondary | ICD-10-CM | POA: Diagnosis not present

## 2024-04-20 DIAGNOSIS — I10 Essential (primary) hypertension: Secondary | ICD-10-CM

## 2024-04-20 DIAGNOSIS — I2510 Atherosclerotic heart disease of native coronary artery without angina pectoris: Secondary | ICD-10-CM

## 2024-04-20 DIAGNOSIS — D509 Iron deficiency anemia, unspecified: Secondary | ICD-10-CM | POA: Diagnosis not present

## 2024-04-20 DIAGNOSIS — Z992 Dependence on renal dialysis: Secondary | ICD-10-CM

## 2024-04-20 DIAGNOSIS — K921 Melena: Secondary | ICD-10-CM | POA: Diagnosis not present

## 2024-04-20 DIAGNOSIS — I5032 Chronic diastolic (congestive) heart failure: Secondary | ICD-10-CM

## 2024-04-20 DIAGNOSIS — E872 Acidosis, unspecified: Secondary | ICD-10-CM | POA: Diagnosis not present

## 2024-04-20 LAB — RENAL FUNCTION PANEL
Albumin: 3.6 g/dL (ref 3.5–5.0)
Anion gap: 23 — ABNORMAL HIGH (ref 5–15)
BUN: 81 mg/dL — ABNORMAL HIGH (ref 6–20)
CO2: 21 mmol/L — ABNORMAL LOW (ref 22–32)
Calcium: 8.4 mg/dL — ABNORMAL LOW (ref 8.9–10.3)
Chloride: 91 mmol/L — ABNORMAL LOW (ref 98–111)
Creatinine, Ser: 13.2 mg/dL — ABNORMAL HIGH (ref 0.61–1.24)
GFR, Estimated: 4 mL/min — ABNORMAL LOW
Glucose, Bld: 133 mg/dL — ABNORMAL HIGH (ref 70–99)
Phosphorus: 9.6 mg/dL — ABNORMAL HIGH (ref 2.5–4.6)
Potassium: 3.8 mmol/L (ref 3.5–5.1)
Sodium: 136 mmol/L (ref 135–145)

## 2024-04-20 LAB — CBC
HCT: 29.4 % — ABNORMAL LOW (ref 39.0–52.0)
Hemoglobin: 10.2 g/dL — ABNORMAL LOW (ref 13.0–17.0)
MCH: 32.1 pg (ref 26.0–34.0)
MCHC: 34.7 g/dL (ref 30.0–36.0)
MCV: 92.5 fL (ref 80.0–100.0)
Platelets: 229 K/uL (ref 150–400)
RBC: 3.18 MIL/uL — ABNORMAL LOW (ref 4.22–5.81)
RDW: 13.6 % (ref 11.5–15.5)
WBC: 7 K/uL (ref 4.0–10.5)
nRBC: 0 % (ref 0.0–0.2)

## 2024-04-20 LAB — GLUCOSE, CAPILLARY
Glucose-Capillary: 130 mg/dL — ABNORMAL HIGH (ref 70–99)
Glucose-Capillary: 96 mg/dL (ref 70–99)
Glucose-Capillary: 120 mg/dL — ABNORMAL HIGH (ref 70–99)

## 2024-04-20 LAB — HEMOGLOBIN A1C
Hgb A1c MFr Bld: 6.2 % — ABNORMAL HIGH (ref 4.8–5.6)
Mean Plasma Glucose: 131.24 mg/dL

## 2024-04-20 MED ORDER — CHLORHEXIDINE GLUCONATE CLOTH 2 % EX PADS
6.0000 | MEDICATED_PAD | Freq: Every day | CUTANEOUS | Status: DC
  Administered 2024-04-21: 6 via TOPICAL

## 2024-04-20 MED ORDER — CARVEDILOL 25 MG PO TABS
25.0000 mg | ORAL_TABLET | Freq: Two times a day (BID) | ORAL | Status: DC
  Administered 2024-04-20 (×2): 25 mg via ORAL
  Filled 2024-04-20 (×2): qty 1

## 2024-04-20 NOTE — Plan of Care (Signed)
" °  Problem: Education: Goal: Ability to describe self-care measures that may prevent or decrease complications (Diabetes Survival Skills Education) will improve Outcome: Progressing   Problem: Health Behavior/Discharge Planning: Goal: Ability to identify and utilize available resources and services will improve Outcome: Progressing   Problem: Health Behavior/Discharge Planning: Goal: Ability to manage health-related needs will improve Outcome: Progressing   Problem: Clinical Measurements: Goal: Will remain free from infection Outcome: Progressing   Problem: Skin Integrity: Goal: Risk for impaired skin integrity will decrease Outcome: Progressing   "

## 2024-04-20 NOTE — Discharge Instructions (Addendum)
 Thank you for allowing us  to be part of your care. You were hospitalized for nausea and vomiting and missed dialysis. We treated you with dialysis and nausea medications.   See the changes in your medications and management of your chronic conditions below:  *For your Nausea  -We have sent you home with zofran  and capsaicin  cream. I also sent you with haldol  (for nausea) to take every 6 hours as needed if the zofran  does not help until you are seen in clinic. Please still call clinic if your nausea is not well controlled or you are unable to tolerate any food or water  -Stop smoking marijuana -Follow up with the clinic and GI doctor.   *For your diarrhea  - Increase your diet as tolerated  - you can take imodium (over the counter) as needed and follow up with the clinic.   *For your Blood Pressure - Take Carvedilol  25 mg twice daily and follow up in the clinic. It appears you tolerated this well while here, but if you have any dizziness or lightheadedness, go back to your 12.5 mg twice daily and call the clinic.   *For your Chronic Pain  Discuss during your hospital follow up trying to taper down off of the hydrocodone -acetaminophen .   FOLLOW UP APPOINTMENTS: We arranged for you to follow up with the clinic on 1/22  Call [facility] to see if they can see you sooner prior to your endoscopy.   Please call your PCP or our clinic if you have any questions or concerns, we may be able to help and keep you from a long and expensive emergency room wait. Our clinic and after hours phone number is [phone]. The best time to call is Monday through Friday 9 am to 4 pm but there is always someone available 24/7 if you have an emergency. If you need medication refills please notify your pharmacy one week in advance and they will send us  a request.   We are glad you are feeling better,  Sallyanne Primas Internal Medicine Inpatient Teaching Service at [facility]

## 2024-04-20 NOTE — Progress Notes (Signed)
 Pt receives out-pt HD at [facility] on MWF 10:25 am chair time. Will assist as needed.   [name] Dialysis Navigator [phone]

## 2024-04-20 NOTE — Progress Notes (Signed)
 " [facility] Progress Note   Subjective:    Seen and examined patient at bedside. 1.3L was removed with HD last night but noted he was feeling dizzy and nauseous and requested to be taken off. Today, euvolemic on exam and on RA. Next HD 1/16 if he remains inpatient.   Objective Vitals:   04/19/24 2229 04/19/24 2359 04/20/24 0427 04/20/24 0933  BP: (!) 173/66 (!) 179/118 (!) 183/87 (!) 168/95  Pulse: 97  99 95  Resp: 11 19 16    Temp: 99.6 F (37.6 C) 97.7 F (36.5 C) 99.2 F (37.3 C) 99.1 F (37.3 C)  TempSrc: Oral Oral Oral Oral  SpO2: 99% 100% 96% 100%  Weight: 92.3 kg      Physical Exam General: Awake, alert, on RA, NAD Head: NCAT sclera not icteric  Lungs: Diminished at bases, clear in uppers. No wheeze, rales or rhonchi. Breathing is unlabored. Heart: RRR. No murmur, rubs or gallops.  Abdomen: soft and non-tender Lower extremities: No LE edema Neuro: AAOx3. Moves all extremities spontaneously. Dialysis Access: L AVF  Filed Weights   04/19/24 2010 04/19/24 2229  Weight: 93.8 kg 92.3 kg    Intake/Output Summary (Last 24 hours) at 04/20/2024 1032 Last data filed at 04/19/2024 2229 Gross per 24 hour  Intake --  Output 1300 ml  Net -1300 ml    Additional Objective Labs: Basic Metabolic Panel: Recent Labs  Lab 04/19/24 1451 04/19/24 1749 04/20/24 0421  NA 137  --  136  K 4.9  --  3.8  CL 89*  --  91*  CO2 17*  --  21*  GLUCOSE 120*  --  133*  BUN 124*  --  81*  CREATININE 18.20* 18.20* 13.20*  CALCIUM  8.0*  --  8.4*  PHOS  --   --  9.6*   Liver Function Tests: Recent Labs  Lab 04/19/24 1451 04/20/24 0421  AST 17  --   ALT 10  --   ALKPHOS 61  --   BILITOT 0.4  --   PROT 8.2*  --   ALBUMIN 3.7 3.6   Recent Labs  Lab 04/19/24 1451  LIPASE 109*   CBC: Recent Labs  Lab 04/19/24 1451 04/19/24 2027 04/20/24 0421  WBC 7.4 7.1 7.0  HGB 10.9* 9.8* 10.2*  HCT 32.1* 28.2* 29.4*  MCV 93.0 90.7 92.5  PLT 266 231 229   Blood  Culture    Component Value Date/Time   SDES BLOOD RIGHT HAND 10/22/2010 0750   SPECREQUEST BOTTLES DRAWN AEROBIC ONLY 10CC 10/22/2010 0750   CULT NO GROWTH 5 DAYS 10/22/2010 0750   REPTSTATUS 10/28/2010 FINAL 10/22/2010 0750    Cardiac Enzymes: No results for input(s): CKTOTAL, CKMB, CKMBINDEX, TROPONINI in the last 168 hours. CBG: Recent Labs  Lab 04/19/24 1917 04/20/24 0935  GLUCAP 123* 130*   Iron Studies: No results for input(s): IRON, TIBC, TRANSFERRIN, FERRITIN in the last 72 hours. Lab Results  Component Value Date   INR 1.1 01/08/2022   INR 1.5 (H) 01/04/2022   INR 2.2 (H) 01/01/2022   Studies/Results: DG Chest Portable 1 View Result Date: 04/19/2024 CLINICAL DATA:  Short of breath, nausea and vomiting EXAM: PORTABLE CHEST 1 VIEW COMPARISON:  10/31/2023 FINDINGS: The heart size and mediastinal contours are within normal limits. Both lungs are clear. The visualized skeletal structures are unremarkable. IMPRESSION: No active disease. Electronically Signed   By: Ozell Daring M.D.   On: 04/19/2024 15:53    Medications:   atorvastatin   40 mg Oral Daily   calcitRIOL   0.25 mcg Oral Daily   capsicum   Topical BID   carvedilol   25 mg Oral BID WC   Chlorhexidine  Gluconate Cloth  6 each Topical Q0600   DULoxetine   20 mg Oral Daily   gabapentin   100 mg Oral QHS   heparin   5,000 Units Subcutaneous Q8H   insulin  aspart  0-15 Units Subcutaneous TID WC   melatonin  3 mg Oral QHS   pantoprazole   40 mg Oral Daily   sertraline   100 mg Oral Daily   sevelamer  carbonate  1,600-2,400 mg Oral TID WC    Dialysis Orders: MWF - [facility] 4hrs, BFR 450, DFR 800,  EDW 92kg, 2K/ 2.5Ca Heparin  3000 unit bolus with HD Mircera 75 mcg q2wks - dose just raised Calcitriol  2mcg PO qHD - last dose 04/10/24 Sensipar 30mg  with HD - last dose 04/10/24  Assessment/Plan: Epigastric pain/nausea - feeling unwell for the past 1.5 weeks causing to miss dialysis (N/V,  dry heaves, sneezing, coughing). May warrant getting resp viral panel since flu rates are currently high ESRD - on HD MWF. Last HD 1/5. S/p HD 1/14 and 1.3L was removed. He was dizzy and nauseous during treatment. CXR unremarkable and euvolemic on exam. EDW last raised on 1/5 in outpatient. Given recent issues on HD, will plan to raise EDW at discharge. Will run him even here tomorrow only if he remains inpatient. Hypertension/volume  - BP up likely 2nd missed HD. Push UF as tolerated Anemia of CKD - Hgb 10.9. No ESA/Fe needed at this time. Resume ESA if he remains inpatient Secondary Hyperparathyroidism -  Check phos in AM Nutrition - Renal diet with fluid restriction   Charmaine Piety, NP [facility] 04/20/2024,10:32 AM  LOS: 0 days    "

## 2024-04-20 NOTE — TOC Initial Note (Signed)
 Transition of Care (TOC) - Initial/Assessment Note   Spoke to patient at bedside.   Patient from home alone   Confirmed face sheet information.   Prior to admission patient independent , used no DME.   Has transportation to appointments and dialysis.   Patient has no home services currently    Inpatient Care Management Team [facility])   will continue to monitor patient advancement through interdisciplinary progression rounds. If new patient transition needs arise, please place a ICMT consult.   Patient Details  Name: [name] MRN: [id number] Date of Birth: [birth year]  Transition of Care [facility]) CM/SW Contact:    Stephane Powell Jansky, RN Phone Number: 04/20/2024, 2:19 PM  Clinical Narrative:                   Expected Discharge Plan: Home/Self Care Barriers to Discharge: Continued Medical Work up   Patient Goals and CMS Choice Patient states their goals for this hospitalization and ongoing recovery are:: to return to home (to return to home)   Choice offered to / list presented to : NA      Expected Discharge Plan and Services In-house Referral: NA Discharge Planning Services: CM Consult Post Acute Care Choice: NA Living arrangements for the past 2 months: Apartment                 DME Arranged: N/A DME Agency: NA       HH Arranged: NA HH Agency: NA        Prior Living Arrangements/Services Living arrangements for the past 2 months: Apartment Lives with:: Self Patient language and need for interpreter reviewed:: Yes Do you feel safe going back to the place where you live?: Yes      Need for Family Participation in Patient Care: No (Comment) Care giver support system in place?: Yes (comment)   Criminal Activity/Legal Involvement Pertinent to Current Situation/Hospitalization: No - Comment as needed  Activities of Daily Living   ADL Screening (condition at time of admission) Independently performs ADLs?: Yes (appropriate for developmental age) Is the  patient deaf or have difficulty hearing?: No Does the patient have difficulty seeing, even when wearing glasses/contacts?: No Does the patient have difficulty concentrating, remembering, or making decisions?: No  Permission Sought/Granted   Permission granted to share information with : No              Emotional Assessment Appearance:: Appears stated age Attitude/Demeanor/Rapport: Engaged Affect (typically observed): Appropriate Orientation: : Oriented to Self, Oriented to Place, Oriented to  Time, Oriented to Situation Alcohol / Substance Use: Not Applicable Psych Involvement: No (comment)  Admission diagnosis:  Shortness of breath [R06.02] ESRD needing dialysis (HCC) [N18.6, Z99.2] Nausea vomiting and diarrhea [R11.2, R19.7] Refractory nausea and vomiting [R11.2] Patient Active Problem List   Diagnosis Date Noted   Right knee pain 12/14/2023   Abnormal CT of the abdomen with potential esophagitis and mediastinal density 12/05/2023   Esophagitis 12/05/2023   Aortic atherosclerosis 12/05/2023   Sigmoid diverticulosis 12/05/2023   Prostate cancer (HCC) 04/15/2023   Malignant neoplasm of prostate (HCC) 02/08/2023   Disc degeneration, lumbar 12/24/2022   Nausea vomiting and diarrhea 11/12/2022   Liver dysfunction 02/03/2022   OSA (obstructive sleep apnea) 01/01/2022   ESRD needing dialysis (HCC) 12/31/2021   (HFpEF) heart failure with preserved ejection fraction (HCC) 12/23/2021   Constipation 10/10/2021   Unintentional weight loss 09/28/2021   Charcot foot due to diabetes mellitus (HCC) 09/05/2020   Depression    Diabetic  neuropathy (HCC) 09/19/2019   Hyperlipidemia 05/10/2019   Chronic hepatitis C (HCC) 05/10/2019   Abnormal CT scan of lung 05/10/2019   Type 2 diabetes mellitus (HCC) 01/29/2016   Essential hypertension 01/29/2016   Lumbar facet arthropathy 08/29/2014   PCP:  Harrie Bruckner, DO Pharmacy:   [facility] - [place], [place] - [address]  [address] [place] [address] Phone: [phone] Fax: [phone]  [facility] [address] [place] [address] Phone: [phone] Fax: [phone]     Social Drivers of Health (SDOH) Social History: SDOH Screenings   Food Insecurity: No Food Insecurity (04/19/2024)  Housing: Unknown (04/19/2024)  Transportation Needs: No Transportation Needs (04/19/2024)  Utilities: Not At Risk (04/19/2024)  Alcohol Screen: Low Risk (02/09/2023)  Depression (PHQ2-9): Low Risk (02/09/2023)  Recent Concern: Depression (PHQ2-9) - High Risk (12/24/2022)  Social Connections: Unknown (04/19/2024)  Tobacco Use: Medium Risk (04/19/2024)   SDOH Interventions:     Readmission Risk Interventions     No data to display

## 2024-04-20 NOTE — Progress Notes (Signed)
 "  HD#0 SUBJECTIVE:  Patient Summary: [name] is a 59 y.o. with a pertinent PMH of  PMH of ESRD on HD MWF, type 2 diabetes, hypertension, HFpEF, chronic hep C, depression, degenerative disc disease, malignant neoplasm of prostate (stage IIIc 2024), who presented with nausea and vomiting and missed HD and admitted for nausea/vomiting.   Overnight Events: Nausea, received haldol . HD 1/14, patient only tolerated 1.4 hours.   Interim History: Assessed bedside. No complaints of CP, changes in vision, HA, SOB. Endorses epigastric abdominal pain and nausea/vomiting. Thinks nausea improved with haldol  administration last night but is not sure. Could not tolerate dialysis yesterday due to nausea and does not think he could tolerate again based on symptoms currently. Did not sleep well last night. Denies fevers and endorses the chronic chills that have improved since yesterday.   OBJECTIVE:  Vital Signs: Vitals:   04/19/24 2219 04/19/24 2229 04/19/24 2359 04/20/24 0427  BP: (!) 148/61 (!) 173/66 (!) 179/118 (!) 183/87  Pulse: 93 97  99  Resp: (!) 29 11 19 16   Temp:  99.6 F (37.6 C) 97.7 F (36.5 C) 99.2 F (37.3 C)  TempSrc:  Oral Oral Oral  SpO2: 98% 99% 100% 96%  Weight:  92.3 kg     Supplemental O2: Room Air SpO2: 96 %  Filed Weights   04/19/24 2010 04/19/24 2229  Weight: 93.8 kg 92.3 kg     Intake/Output Summary (Last 24 hours) at 04/20/2024 0730 Last data filed at 04/19/2024 2229 Gross per 24 hour  Intake --  Output 1300 ml  Net -1300 ml   Net IO Since Admission: -1,300 mL [04/20/24 0730]  Physical Exam: Physical Exam Constitutional:      General: He is not in acute distress.    Appearance: He is normal weight. He is not ill-appearing, toxic-appearing or diaphoretic.  Cardiovascular:     Rate and Rhythm: Normal rate and regular rhythm.     Heart sounds: Normal heart sounds. No murmur heard.    No friction rub. No gallop.  Pulmonary:     Effort: Pulmonary effort  is normal.     Breath sounds: Normal breath sounds. No stridor. No wheezing, rhonchi or rales.  Abdominal:     General: Bowel sounds are normal. There is no distension.     Palpations: Abdomen is soft.     Tenderness: There is abdominal tenderness in the epigastric area.  Musculoskeletal:     Right lower leg: No edema.     Left lower leg: No edema.  Skin:    General: Skin is warm and dry.  Neurological:     Mental Status: He is alert.     Patient Lines/Drains/Airways Status     Active Line/Drains/Airways     Name Placement date Placement time Site Days   Peripheral IV 04/19/24 20 G Right Antecubital 04/19/24  1452  Antecubital  1   Fistula / Graft Left Upper arm Arteriovenous fistula 09/11/22  1056  Upper arm  587            Pertinent labs and imaging:      Latest Ref Rng & Units 04/20/2024    4:21 AM 04/19/2024    8:27 PM 04/19/2024    2:51 PM  CBC  WBC 4.0 - 10.5 K/uL 7.0  7.1  7.4   Hemoglobin 13.0 - 17.0 g/dL 89.7  9.8  89.0   Hematocrit 39.0 - 52.0 % 29.4  28.2  32.1   Platelets 150 -  400 K/uL 229  231  266        Latest Ref Rng & Units 04/20/2024    4:21 AM 04/19/2024    5:49 PM 04/19/2024    2:51 PM  CMP  Glucose 70 - 99 mg/dL 866   879   BUN 6 - 20 mg/dL 81   875   Creatinine 9.38 - 1.24 mg/dL 86.79  81.79  81.79   Sodium 135 - 145 mmol/L 136   137   Potassium 3.5 - 5.1 mmol/L 3.8   4.9   Chloride 98 - 111 mmol/L 91   89   CO2 22 - 32 mmol/L 21   17   Calcium  8.9 - 10.3 mg/dL 8.4   8.0   Total Protein 6.5 - 8.1 g/dL   8.2   Total Bilirubin 0.0 - 1.2 mg/dL   0.4   Alkaline Phos 38 - 126 U/L   61   AST 15 - 41 U/L   17   ALT 0 - 44 U/L   10     DG Chest Portable 1 View Result Date: 04/19/2024 CLINICAL DATA:  Short of breath, nausea and vomiting EXAM: PORTABLE CHEST 1 VIEW COMPARISON:  10/31/2023 FINDINGS: The heart size and mediastinal contours are within normal limits. Both lungs are clear. The visualized skeletal structures are unremarkable.  IMPRESSION: No active disease. Electronically Signed   By: Ozell Daring M.D.   On: 04/19/2024 15:53    ASSESSMENT/PLAN:  Assessment: Active Problems:   Nausea vomiting and diarrhea   Type 2 diabetes mellitus (HCC)   Essential hypertension   ESRD needing dialysis (HCC)   Plan: Nausea/vomiting Diarrhea, resolved  2/2 cannabinoid hyperemesis syndrome versus infectious enterocolitis versus uremia Patient endorses continued nausea which has not worsened since yesterday. Received haldol  last night but is unsure if helpful. Nauseous at bedside and stated he does not think he would be able to tolerate dialysis. Patient had formed, soft BM this AM without blood/melena.  - Haldol  0.5 mg as needed for nausea - Capsicum cream twice daily ordered - Consider tricyclic antidepressant (caution for borderline QT) - advance diet as tolerated.    *Hematemesis (H&P error: hematochezia)  Melena Chronic iron deficiency anemia History of hiatal hernia Patient described several episodes of hematemesis which self resolved followed by some instances of melena. Currently without hematemesis or melena (last BM 1/15 AM soft, formed). CBC stable 10.2. Endorsing epigastric pain which is also unchanged from yesterday. Current medication management includes pantoprazole  40 mg daily.  - Encouraged follow-up outpatient with GI - pantoprazole  40 mg daily   Anion gap metabolic acidosis Azotemia ESRD on HD MWF Patient has missed several dialysis sessions due to nausea/vomiting/diarrhea.  Patient's only complaint currently is N and dry-heaving. Patient is euvolemic on exam.  Was unable to tolerate full HD session given symptoms. Labs this AM show improved/closing AG 31-->23, CO2 17-->21, Cl 89-->91, Cr 18.20-->13.30, BUN 124-->81, Phosphorus 9.6. Plan to have HD tomorrow in hospital as outpatient HD is Friday at 10:45. Will continue to treat Nausea as stated above.  - Dialysis 1/16 - Continue binders - Monitor with  RFP   Hypertensive urgency Last BP 168/95. Patient continued to deny headache, chest pain, shortness of breath, changes in vision during examination.  - Resume home blood pressure medication carvedilol  25 mg - consider adding hydralazine  50 mg TID (previously stopped outpatient with low BP)   Type 2 diabetes Last A1c 04/08/2023 6.5.  Home medications include 20 units subcu Lantus   daily.  With increasing diet, can order long-acting insulin  daily. - Sliding scale ordered - Follow-up A1c   Chronic Hepatitis C, treated in 2021 History of alcohol use disorder Previous workup in 2023 for cirrhosis with fib 4 score 2.5.  AST/ALT WNL. Hep C treated in 2021 - Hepatitis B surface Ag non-reactive   Chronic pain History of back surgeries Restless leg Patient reports that after several back surgeries several years ago he has now been taking hydrocodone -acetaminophen  5-325 mg x3 nightly for 1 year. Discussed with patient concerns for this regimen. Stated he did not sleep well last night. Stated he thinks there is an insomnia component, discussed cognitive behavioral therapy for outpatient.  - Melatonin 3 mg ordered - Continue follow-up with iron infusion outpatient - Continue gabapentin  100 mg nightly bedtime - Continue duloxetine  20 mg daily - Hydrocodone -acetaminophen  5-325 mg 1 tablet as needed - Tylenol  as needed - CBT outpatient for insomnia    Cannabinoid use disorder Patient reports smoking marijuana daily.  He states that he does this because it settles the stomach but also he has anxiety and is worried about a lot of things.  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  He states that he thinks he can quit again especially if his symptoms are related to his marijuana use.  Patient was counseled extensively on benefits of stopping marijuana smoking especially in setting of cannabinoid hyperemesis syndrome.   Hyperlipidemia - Continue atorvastatin   40 mg   HFpEF Euvolemic.  Prostate Cancer, s/p radical prostatectomy with bilateral lymph node dissection 2025 No complaints of urinary symptoms.   Best Practice: Diet: Renal Diet  VTE: heparin  injection 5,000 Units Start: 04/19/24 1745 Code: Full  Disposition planning: Therapy Recs: none, DME: none DISPO: Anticipated discharge tomorrow to Home pending nausea and finishing HD.  Signature:  Sallyanne Benuel [facility]  7:30 AM, 04/20/2024  On Call pager [phone]  "

## 2024-04-20 NOTE — Progress Notes (Signed)
" °   04/20/24 1410  OTHER  Substance Abuse Education Offered Yes  Substance abuse interventions Patient Counseling;Educational Materials  (CAGE-AID) Substance Abuse Screening Tool  Have You Ever Felt You Ought to Cut Down on Your Drinking or Drug Use? 0  Have People Annoyed You By Critizing Your Drinking Or Drug Use? 1  Have You Felt Bad Or Guilty About Your Drinking Or Drug Use? 0  Have You Ever Had a Drink or Used Drugs First Thing In The Morning to Steady Your Nerves or to Get Rid of a Hangover? 1  CAGE-AID Score 2    "

## 2024-04-21 ENCOUNTER — Other Ambulatory Visit (HOSPITAL_COMMUNITY): Payer: Self-pay

## 2024-04-21 DIAGNOSIS — D509 Iron deficiency anemia, unspecified: Secondary | ICD-10-CM | POA: Diagnosis not present

## 2024-04-21 DIAGNOSIS — R1116 Cannabis hyperemesis syndrome: Secondary | ICD-10-CM | POA: Diagnosis not present

## 2024-04-21 DIAGNOSIS — E872 Acidosis, unspecified: Secondary | ICD-10-CM | POA: Diagnosis not present

## 2024-04-21 DIAGNOSIS — K921 Melena: Secondary | ICD-10-CM | POA: Diagnosis not present

## 2024-04-21 LAB — RENAL FUNCTION PANEL
Albumin: 3.3 g/dL — ABNORMAL LOW (ref 3.5–5.0)
Anion gap: 25 — ABNORMAL HIGH (ref 5–15)
BUN: 88 mg/dL — ABNORMAL HIGH (ref 6–20)
CO2: 20 mmol/L — ABNORMAL LOW (ref 22–32)
Calcium: 8.3 mg/dL — ABNORMAL LOW (ref 8.9–10.3)
Chloride: 90 mmol/L — ABNORMAL LOW (ref 98–111)
Creatinine, Ser: 14.8 mg/dL — ABNORMAL HIGH (ref 0.61–1.24)
GFR, Estimated: 3 mL/min — ABNORMAL LOW
Glucose, Bld: 86 mg/dL (ref 70–99)
Phosphorus: 12.1 mg/dL — ABNORMAL HIGH (ref 2.5–4.6)
Potassium: 4.4 mmol/L (ref 3.5–5.1)
Sodium: 134 mmol/L — ABNORMAL LOW (ref 135–145)

## 2024-04-21 LAB — CBC WITH DIFFERENTIAL/PLATELET
Abs Immature Granulocytes: 0.02 K/uL (ref 0.00–0.07)
Basophils Absolute: 0 K/uL (ref 0.0–0.1)
Basophils Relative: 1 %
Eosinophils Absolute: 0.1 K/uL (ref 0.0–0.5)
Eosinophils Relative: 2 %
HCT: 28.4 % — ABNORMAL LOW (ref 39.0–52.0)
Hemoglobin: 9.7 g/dL — ABNORMAL LOW (ref 13.0–17.0)
Immature Granulocytes: 0 %
Lymphocytes Relative: 40 %
Lymphs Abs: 2 K/uL (ref 0.7–4.0)
MCH: 31.5 pg (ref 26.0–34.0)
MCHC: 34.2 g/dL (ref 30.0–36.0)
MCV: 92.2 fL (ref 80.0–100.0)
Monocytes Absolute: 0.4 K/uL (ref 0.1–1.0)
Monocytes Relative: 9 %
Neutro Abs: 2.4 K/uL (ref 1.7–7.7)
Neutrophils Relative %: 48 %
Platelets: 234 K/uL (ref 150–400)
RBC: 3.08 MIL/uL — ABNORMAL LOW (ref 4.22–5.81)
RDW: 13.7 % (ref 11.5–15.5)
WBC: 4.9 K/uL (ref 4.0–10.5)
nRBC: 0 % (ref 0.0–0.2)

## 2024-04-21 LAB — HEPATITIS B SURFACE ANTIBODY, QUANTITATIVE: Hep B S AB Quant (Post): 618 m[IU]/mL

## 2024-04-21 LAB — GLUCOSE, CAPILLARY
Glucose-Capillary: 117 mg/dL — ABNORMAL HIGH (ref 70–99)
Glucose-Capillary: 79 mg/dL (ref 70–99)

## 2024-04-21 MED ORDER — ONDANSETRON HCL 4 MG/2ML IJ SOLN
INTRAMUSCULAR | Status: AC
  Filled 2024-04-21: qty 2

## 2024-04-21 MED ORDER — CAPSAICIN 0.075 % EX CREA
TOPICAL_CREAM | Freq: Two times a day (BID) | CUTANEOUS | 0 refills | Status: AC
  Filled 2024-04-21: qty 28.3, fill #0

## 2024-04-21 MED ORDER — LIDOCAINE-PRILOCAINE 2.5-2.5 % EX CREA: 1.0000 | TOPICAL_CREAM | CUTANEOUS | Status: DC | PRN

## 2024-04-21 MED ORDER — CARVEDILOL 25 MG PO TABS
25.0000 mg | ORAL_TABLET | Freq: Two times a day (BID) | ORAL | 0 refills | Status: AC
  Filled 2024-04-21: qty 60, 30d supply, fill #0

## 2024-04-21 MED ORDER — ALTEPLASE 2 MG IJ SOLR: 2.0000 mg | Freq: Once | INTRAMUSCULAR | Status: DC | PRN

## 2024-04-21 MED ORDER — HEPARIN SODIUM (PORCINE) 1000 UNIT/ML DIALYSIS: 1000.0000 [IU] | INTRAMUSCULAR | Status: DC | PRN

## 2024-04-21 MED ORDER — HALOPERIDOL 0.5 MG PO TABS
0.5000 mg | ORAL_TABLET | Freq: Four times a day (QID) | ORAL | 0 refills | Status: AC | PRN
  Filled 2024-04-21: qty 8, 2d supply, fill #0

## 2024-04-21 MED ORDER — LIDOCAINE HCL (PF) 1 % IJ SOLN: 5.0000 mL | INTRAMUSCULAR | Status: DC | PRN

## 2024-04-21 MED ORDER — PENTAFLUOROPROP-TETRAFLUOROETH EX AERO: 1.0000 | INHALATION_SPRAY | CUTANEOUS | Status: DC | PRN

## 2024-04-21 MED ORDER — ONDANSETRON HCL 4 MG/2ML IJ SOLN
4.0000 mg | Freq: Once | INTRAMUSCULAR | Status: AC
  Administered 2024-04-21: 4 mg via INTRAVENOUS

## 2024-04-21 MED ORDER — ONDANSETRON HCL 4 MG PO TABS
4.0000 mg | ORAL_TABLET | Freq: Three times a day (TID) | ORAL | 0 refills | Status: AC | PRN
  Filled 2024-04-21: qty 20, 7d supply, fill #0

## 2024-04-21 NOTE — Progress Notes (Signed)
 D/C order noted. Contacted [facility] to be advised of pt's d/c today and that pt should resume care on Monday.   [name] Dialysis Navigator [phone]

## 2024-04-21 NOTE — Procedures (Signed)
 I was present at this dialysis session. I have reviewed the session itself and made appropriate changes.   Filed Weights   04/19/24 2010 04/19/24 2229 04/21/24 0749  Weight: 93.8 kg 92.3 kg 93.6 kg    Recent Labs  Lab 04/21/24 0522  NA 134*  K 4.4  CL 90*  CO2 20*  GLUCOSE 86  BUN 88*  CREATININE 14.80*  CALCIUM  8.3*  PHOS 12.1*    Recent Labs  Lab 04/19/24 2027 04/20/24 0421 04/21/24 0522  WBC 7.1 7.0 4.9  NEUTROABS  --   --  2.4  HGB 9.8* 10.2* 9.7*  HCT 28.2* 29.4* 28.4*  MCV 90.7 92.5 92.2  PLT 231 229 234    Scheduled Meds:  atorvastatin   40 mg Oral Daily   calcitRIOL   0.25 mcg Oral Daily   capsicum   Topical BID   carvedilol   25 mg Oral BID WC   Chlorhexidine  Gluconate Cloth  6 each Topical Q0600   DULoxetine   20 mg Oral Daily   gabapentin   100 mg Oral QHS   heparin   5,000 Units Subcutaneous Q8H   insulin  aspart  0-15 Units Subcutaneous TID WC   melatonin  3 mg Oral QHS   pantoprazole   40 mg Oral Daily   sertraline   100 mg Oral Daily   sevelamer  carbonate  1,600-2,400 mg Oral TID WC   Continuous Infusions: PRN Meds:.acetaminophen , alteplase , haloperidol  **OR** haloperidol  lactate, heparin , HYDROcodone -acetaminophen , lidocaine  (PF), lidocaine -prilocaine , pentafluoroprop-tetrafluoroeth   Ephriam Stank, MD [facility] 04/21/2024, 9:48 AM

## 2024-04-21 NOTE — Progress Notes (Signed)
" °   04/21/24 1146  Vitals  Temp 98.8 F (37.1 C)  Pulse Rate 91  Resp 14  BP (!) 160/76  SpO2 100 %  O2 Device Nasal Cannula  Weight 94 kg  Type of Weight Post-Dialysis  Oxygen Therapy  O2 Flow Rate (L/min) 2 L/min  Patient Activity (if Appropriate) In bed  Pulse Oximetry Type Continuous  Post Treatment  Dialyzer Clearance Lightly streaked  Liters Processed 83  Fluid Removed (mL) 0 mL  Tolerated HD Treatment Yes  AVG/AVF Arterial Site Held (minutes) 7 minutes  AVG/AVF Venous Site Held (minutes) 7 minutes    "

## 2024-04-21 NOTE — Plan of Care (Signed)

## 2024-04-21 NOTE — Progress Notes (Signed)
 " [facility] Progress Note   Subjective:    Seen and examined patient on dialysis. No complaints. Eager to go home today. Didn't want any fluid removed today. Denies any SOB   Objective Vitals:   04/21/24 0802 04/21/24 0807 04/21/24 0850 04/21/24 0905  BP: (!) 156/82 (!) 172/99 (!) 160/83 (!) 158/75  Pulse: 74 81 84 85  Resp: (!) 21 18 13 16   Temp:      TempSrc:      SpO2: 99% 99% 98% 99%  Weight:       Physical Exam General: Awake, alert, on RA, NAD Head: NCAT sclera not icteric  Lungs: unlabored, normal wob, speaking in full sentences Heart: RRR Abdomen: soft and non-tender Lower extremities: No LE edema Neuro: awake, alert Dialysis Access: L AVF  [facility] Weights   04/19/24 2010 04/19/24 2229 04/21/24 0749  Weight: 93.8 kg 92.3 kg 93.6 kg   No intake or output data in the 24 hours ending 04/21/24 0949   Additional Objective Labs: Basic Metabolic Panel: Recent Labs  Lab 04/19/24 1451 04/19/24 1749 04/20/24 0421 04/21/24 0522  NA 137  --  136 134*  K 4.9  --  3.8 4.4  CL 89*  --  91* 90*  CO2 17*  --  21* 20*  GLUCOSE 120*  --  133* 86  BUN 124*  --  81* 88*  CREATININE 18.20* 18.20* 13.20* 14.80*  CALCIUM  8.0*  --  8.4* 8.3*  PHOS  --   --  9.6* 12.1*   Liver Function Tests: Recent Labs  Lab 04/19/24 1451 04/20/24 0421 04/21/24 0522  AST 17  --   --   ALT 10  --   --   ALKPHOS 61  --   --   BILITOT 0.4  --   --   PROT 8.2*  --   --   ALBUMIN 3.7 3.6 3.3*   Recent Labs  Lab 04/19/24 1451  LIPASE 109*   CBC: Recent Labs  Lab 04/19/24 1451 04/19/24 2027 04/20/24 0421 04/21/24 0522  WBC 7.4 7.1 7.0 4.9  NEUTROABS  --   --   --  2.4  HGB 10.9* 9.8* 10.2* 9.7*  HCT 32.1* 28.2* 29.4* 28.4*  MCV 93.0 90.7 92.5 92.2  PLT 266 231 229 234   Blood Culture    Component Value Date/Time   SDES BLOOD RIGHT HAND 10/22/2010 0750   SPECREQUEST BOTTLES DRAWN AEROBIC ONLY 10CC 10/22/2010 0750   CULT NO GROWTH 5 DAYS 10/22/2010 0750    REPTSTATUS 10/28/2010 FINAL 10/22/2010 0750    Cardiac Enzymes: No results for input(s): CKTOTAL, CKMB, CKMBINDEX, TROPONINI in the last 168 hours. CBG: Recent Labs  Lab 04/19/24 1917 04/20/24 0935 04/20/24 1150 04/20/24 1633 04/20/24 2108  GLUCAP 123* 130* 96 120* 117*   Iron Studies: No results for input(s): IRON, TIBC, TRANSFERRIN, FERRITIN in the last 72 hours. Lab Results  Component Value Date   INR 1.1 01/08/2022   INR 1.5 (H) 01/04/2022   INR 2.2 (H) 01/01/2022   Studies/Results: DG Chest Portable 1 View Result Date: 04/19/2024 CLINICAL DATA:  Short of breath, nausea and vomiting EXAM: PORTABLE CHEST 1 VIEW COMPARISON:  10/31/2023 FINDINGS: The heart size and mediastinal contours are within normal limits. Both lungs are clear. The visualized skeletal structures are unremarkable. IMPRESSION: No active disease. Electronically Signed   By: Ozell Daring M.D.   On: 04/19/2024 15:53    Medications:   atorvastatin   40 mg Oral Daily  calcitRIOL   0.25 mcg Oral Daily   capsicum   Topical BID   carvedilol   25 mg Oral BID WC   Chlorhexidine  Gluconate Cloth  6 each Topical Q0600   DULoxetine   20 mg Oral Daily   gabapentin   100 mg Oral QHS   heparin   5,000 Units Subcutaneous Q8H   insulin  aspart  0-15 Units Subcutaneous TID WC   melatonin  3 mg Oral QHS   pantoprazole   40 mg Oral Daily   sertraline   100 mg Oral Daily   sevelamer  carbonate  1,600-2,400 mg Oral TID WC    Dialysis Orders: MWF - [facility] 4hrs, BFR 450, DFR 800,  EDW 92kg, 2K/ 2.5Ca Heparin  3000 unit bolus with HD Mircera 75 mcg q2wks - dose just raised Calcitriol  2mcg PO qHD - last dose 04/10/24 Sensipar  30mg  with HD - last dose 04/10/24  Assessment/Plan: Epigastric pain/nausea - feeling unwell for the past 1.5 weeks causing to miss dialysis (N/V, dry heaves, sneezing, coughing). May warrant getting resp viral panel since flu rates are currently high--per primary ESRD - on  HD MWF. Will need EDW readjusted on discharge, seems like he lost weight recently (has been under EDW as per patient) Hypertension/volume  - BP up likely 2nd missed HD, better currently Anemia of CKD - Hgb 9.7. No ESA/Fe needed at this time. Will resume esa if he remains inpatient otherwise reevaluate as OP. Fe panel ordered Secondary Hyperparathyroidism -  phos extremely high 12.1 due to missed HD, c/w renvela  Nutrition - Renal diet with fluid restriction  Okay for discharge from our end.  Ephriam Stank, MD [facility] 04/21/2024,9:49 AM  LOS: 0 days    "

## 2024-04-21 NOTE — Discharge Summary (Addendum)
 "  Name: [name] MRN: [id number] DOB: 12-23-1965 59 y.o. PCP: Harrie Bruckner, DO  Date of Admission: 04/19/2024  2:37 PM Date of Discharge: 04/21/2024 Attending Physician: Dr. Reyes Fenton  Discharge Diagnosis: 1. Active Problems:   Nausea vomiting and diarrhea   Type 2 diabetes mellitus (HCC)   Essential hypertension   ESRD needing dialysis [facility])   Discharge Medications: Allergies as of 04/21/2024       Reactions   Norvasc  [amlodipine ] Other (See Comments)   Bilateral leg swelling   Zestril  [lisinopril ] Swelling, Other (See Comments)   Face and legs swell        Medication List     PAUSE taking these medications    prochlorperazine  5 MG tablet Wait to take this until your doctor or other care provider tells you to start again. Commonly known as: COMPAZINE  Take 1 tablet (5 mg total) by mouth every 6 (six) hours as needed for nausea or vomiting.       TAKE these medications    atorvastatin  40 MG tablet Commonly known as: LIPITOR Take 1 tablet (40 mg total) by mouth daily.   CALCITRIOL  PO Take by mouth.   capsicum 0.075 % topical cream Commonly known as: ZOSTRIX Apply topically 2 (two) times daily.   carvedilol  25 MG tablet Commonly known as: COREG  Take 1 tablet (25 mg total) by mouth 2 (two) times daily with a meal. What changed:  medication strength how much to take   diclofenac  Sodium 1 % Gel Commonly known as: Voltaren  Apply 4 g topically 4 (four) times daily.   DULoxetine  20 MG capsule Commonly known as: CYMBALTA  TAKE 1 CAPSULE BY MOUTH DAILY   feeding supplement (NEPRO CARB STEADY) Liqd Take 237 mLs by mouth daily as needed.   FreeStyle Libre 3 Plus Sensor Misc Change sensor every 15 days.   FreeStyle Libre 3 Reader [employer] Use with freestyle libre 3 plus sensors to monitor your blood sugar continuously   gabapentin  100 MG capsule Commonly known as: NEURONTIN  Take 1 capsule (100 mg total) by mouth at bedtime.   haloperidol   0.5 MG tablet Commonly known as: HALDOL  Take 1 tablet (0.5 mg total) by mouth every 6 (six) hours as needed (for nausea and vomiting).   HYDROcodone -acetaminophen  5-325 MG tablet Commonly known as: NORCO/VICODIN Take 3 tablets by mouth as needed for severe pain (pain score 7-10) or moderate pain (pain score 4-6).   iron sucrose  20 MG/ML injection Commonly known as: VENOFER  Inject into the vein one time in dialysis.   Lantus  SoloStar 100 UNIT/ML Solostar Pen Generic drug: insulin  glargine Inject 20 Units into the skin daily. What changed: when to take this   ondansetron  4 MG tablet Commonly known as: ZOFRAN  Take 1 tablet (4 mg total) by mouth every 8 (eight) hours as needed for nausea or vomiting.   pantoprazole  20 MG tablet Commonly known as: PROTONIX  Take 2 tablets (40 mg total) by mouth daily.   Pen Needles 32G X 4 MM Misc 1 Needle by Does not apply route daily at 6 (six) AM.   SENSIPAR  PO Take by mouth.   sertraline  100 MG tablet Commonly known as: ZOLOFT  TAKE 1 TABLET BY MOUTH DAILY   sevelamer  carbonate 800 MG tablet Commonly known as: RENVELA  Take 1,600-2,400 mg by mouth 3 (three) times daily with meals. 800 mg -1600 mg with snack        Disposition and follow-up:   Mr.Yigit E Yusko was discharged from [facility] in  Good condition.  At the hospital follow up visit please address:  1.    Nausea/Vomiting: likely 2/2 cannabinoid hyperemesis syndrome, but encouraged reaching out to GI for f/u. Already has endoscopy scheduled for February from before hospitalization. Has history of hiatal hernia and previous workup for esophagitis. Other considerations include PUD (urea  breath test with cessation of PPI?) with abdominal tenderness to epigastric region remaining constant, although this could be due to 8 days of nausea. On protonix  40 mg daily. Discharged with zofran  and capsicum cream, treated with haldol  during hospitalization (did not need  1/16).  Insomnia/Anxiety/marijuana use: consider increase to SSRI or switch to tricyclic antidepressant for help with cannabinoid hyperemesis syndrome. Discuss further cognitive behavioral therapy for insomnia (sleep hygiene), encourage continued cessation of marijuana. Patient stated he was having some anxiety during HD at discharge that presented as some CP and resolved quickly - further discuss and consider PRN anxiety medication if indicated.   HTN: discharged with carvedilol  25 mg BID. Previously on 12.5 mg BID. He has had changes to management in the past (previously stopped others due to hypotension) May need further adjustments, also has BP management during HD.   ESRD and Anion Gap metabolic Acidosis: likely 2/2 uremia. Gap was closing but then couldn't tolerate HD so opened slightly (AG 31-->23-->25). Recheck at f/u. Nephro agreed to discharge. Nephro recommended EDW lowered on discharge. HD MWF.    Iron Deficiency Anemia: episodes of hematemesis and melena resolved during hospitalization. Likely 2/2 mallory weiss. Monitor.   Chronic Pain: taking hydrocodone -acetaminophen  3x 5-325 mg nightly for the past year. Discussed dangers of this during hospitalization. Prescribed 1 tab q 8 hours PRN during hospitalization (no signs of withdrawal). Discharged with home dose. Encouraged further discussion with PCP and orthopedic surgery (perscribers) regarding this medication. Ask regarding signs of withdrawal symptoms.   2.  Labs / imaging needed at time of follow-up: RFP, CBC  3.  Pending labs/ test needing follow-up: iron panel  Follow-up Appointments:  Follow-up Information     Harrie Bruckner, DO. Go on 04/27/2024.   Specialty: Internal Medicine Why: @1 :45 Contact information: [address], [address] [place] [address] [phone]         [facility]. Call in 1 week(s).   Specialty: Gastroenterology Why: call Cloretta Berke to try and be seen for an  earlier appointment time. Contact information: [address] [place]  [address] [phone]                 Hospital Course by problem list: [name] is a 59 y.o. person living with a history of PMH of  PMH of ESRD on HD MWF, type 2 diabetes, hypertension, HFpEF, chronic hep C, depression, degenerative disc disease, malignant neoplasm of prostate (stage IIIc 2024), who presented with nausea and vomiting and missed HD and admitted for nausea/vomiting now being discharged on hospital day 0 with the following pertinent hospital course:  Nausea/vomiting Diarrhea 2/2 cannabinoid hyperemesis syndrome versus infectious enterocolitis versus uremia Patient reports 8 days of nausea/vomiting followed by diarrhea. Infectious cause unlikely: afebrile, no leukocytosis, negative lactic acid. Pancreatitis unlikely with only slight elevation in lipase at 109 which was stable from 5 months ago at 106.  Patient is endorsed epigastric pain which is likely 2/2 retching/dry heaving.  Patient admitted to smoking marijuana daily and using it to help settle his stomach.  This however is not the first episode of nausea/vomiting/diarrhea like this presentation. Last episode was about 5 years  ago having occurred without having smoked marijuana -during this time it could have been infectious. High suspicion for cannabinoid hyperemesis syndrome given smoking history.  No reported diarrhea for 2 days during admission. Patient did have 1 episode of diarrhea prior to discharge but it was without melena.  Nausea improved with haldol  and patient did not require haldol  during 1/16. Tolerating p.o. with jello on discharge. Counseled patient on importance of marijuana smoking cessation.  Differential diagnosis also included uremia, however with symptom onset occurring while having HD and compliant with HD, this feels more unlikely.    Hematemesis Melena Chronic iron deficiency anemia History of  hiatal hernia Patient described several episodes of hematemesis which self resolved followed by some instances of melena.  Patient also has a history of reported hiatal hernia and likely esophagitis (CT abdomen pelvis with findings suspicious for esophagitis such as wall thickening and mediastinal changes.).  Patient reports that this has occurred previously after severe retching/dry heaving.  Melena likely 2/2 hematemesis which is possibly 2/2 Mallory-Weiss tear.  Patient stated that he has an EGD scheduled for February.  Patient also reporting chronic chills which have been worsening past couple days.  He reports that he always feels cold.  Patient seen visibly shaking during initial examination which resolved warm blanket and was not witnessed again.  Temperature on admission 98.5.  Patient receives iron infusions outpatient.  Hemoglobin stable. Continued pantoprazole  and encouraged outpatient GI follow up. Previous gastric emptying 02/01/2024 negative.     Anion gap metabolic acidosis Azotemia ESRD on HD MWF Patient had missed several dialysis sessions due to nausea/vomiting/diarrhea.  Patient denied orthopnea, confusion, muscle cramps, swelling.  Patient remained euvolemic on exam.  Chest x-ray unremarkable. Patient was unable to tolerate first HD session due to nausea. Patient okay from nephrology perspective 1/16, however patient was kept inpatient for further symptom control and since outpatient HD is in AM. Received HD 1/16 and tolerated so was discharged. Nephrology recommending EDW lowered on discharge.    Hypertensive urgency Blood pressure 205/102-->162/91 on admission. Due to inability to tolerate p.o. intake, patient has not been able to take blood pressure medications.  Patient denied headache, chest pain, shortness of breath, changes in vision. Received PRN meds, HD, and when able to tolerate PO, started coreg  25 mg BID. Home dose was coreg  12.5 BID. Discharged with increase dose.  Encouraged f/u outpatient. BP on discharge prior to HD was normotensive.   Chest pain SOB Patient presented to ED with additional complaint of CP and SOB. CXR unremarkable, troponin elevated at 145 --> 147. EKG without STEMI. Low suspicion of PE or ACS. Patient appeared euvolemic although had missed some HD sessions. Chest pain resolved and patient stated he was given Baxter for Sob but did not feel like he needed it. During examination he did not require O2 and has been saturating >98% on RA. On day of discharge at end of HD, patient reported chest pain and SOB. Patient was given 2L [place] but symptoms quickly resolved. When I went to go speak to patient, he stated he was not sure why he got the O2 as he was not really SOB. He thinks the CP is related to anxiety as towards the end of the HD session he started feeling anxious then this occurred. Chest pain was reproducible on physical examination and patient continues to complain of the stable epigastric pain. Chest pain could be 2/2 anxiety, MSK from retching, burning from profuse vomiting. Low suspicion for ACS at the time  of discharge, RFP prior to HD K 4.4. Patient asymptomatic for CP or SOB at the time of my evaluation. Patient agreeable and stable for discharge. Discussed return precautions and to call the clinic with any questions or concerns regarding these symptoms or the N/V.    Chronic pain History of back surgeries Restless leg Patient reports that after several back surgeries several years ago he has now been taking hydrocodone -acetaminophen  5-325 mg nightly for 1 year.  He reports that the prescription is for 3 times daily as needed, however he is taking all 3 tablets at night to help him sleep.  He states that the pain in his back and legs make it to where he cannot sleep.  Patient also has a history of iron deficiency anemia and restless leg could be contributing.  Counseled patient on the dangers of this habit and the changes that would need to be  made during this hospitalization.  Patient stated understanding.  Patient also taking gabapentin  100 mg daily at bedtime and duloxetine  20 mg daily. Prescribed 1 tab q 8 hours PRN during hospitalization (no signs of withdrawal). Discharged with home dose. Encouraged further discussion with PCP and orthopedic surgery (perscribers) regarding this medication. Ask regarding signs of withdrawal symptoms.    Cannabinoid use disorder Patient reports smoking marijuana daily.  He states that he does this because it settles the stomach but also he has anxiety and is worried about a lot of things.  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  He states that he thinks he can quit again especially if his symptoms are related to his marijuana use.  Patient was counseled extensively on benefits of stopping marijuana smoking especially in setting of cannabinoid hyperemesis syndrome.  Type 2 diabetes Last A1c 6.2. sliding scale ordered given low PO intake. Home meds include Lantus  20 units daily.    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  AST/ALT WNL. Hep C treated in 2021. Hepatitis B surface Ag non-reactive     Hyperlipidemia Continued atorvastatin  40 mg   HFpEF Euvolemic.  Chest x-ray unremarkable.     Subjective Patient assessed at HD. Tolerating HD well. Stated nausea had improved. Has not needed haldol  since 1/15. Stated that epigastric abdominal pain was the same. Had bowel movement this AM which was diarrhea and improved his abdominal pain a little. Stated it was still hard for him to eat, but he was tolerating jello. Denied CP or SOB. No new or worsening pain. Overall stated he felt comfortable with outpatient follow up.   Discharge Exam:   BP (!) 160/76   Pulse 91   Temp 98.8 F (37.1 C)   Resp 14   Wt 94 kg   SpO2 100%   BMI 28.90 kg/m  Discharge exam:  Physical  Exam Constitutional:      General: He is not in acute distress.    Appearance: He is not ill-appearing, toxic-appearing or diaphoretic.     Comments: In HD bed  Cardiovascular:     Rate and Rhythm: Normal rate and regular rhythm.     Heart sounds: Normal heart sounds. No murmur heard.    No friction rub. No gallop.     Comments: Chest pain reproducible.  Pulmonary:     Effort: Pulmonary effort is normal.     Breath sounds: Normal breath sounds.  Abdominal:     General:  Abdomen is protuberant. Bowel sounds are normal. There is no distension.     Palpations: Abdomen is soft.     Tenderness: There is abdominal tenderness in the epigastric area.  Musculoskeletal:     Right lower leg: No edema.     Left lower leg: No edema.  Skin:    General: Skin is warm and dry.  Neurological:     Mental Status: He is alert.  Psychiatric:        Mood and Affect: Mood normal.        Behavior: Behavior normal.      Pertinent Labs, Studies, and Procedures:     Latest Ref Rng & Units 04/21/2024    5:22 AM 04/20/2024    4:21 AM 04/19/2024    8:27 PM  CBC  WBC 4.0 - 10.5 K/uL 4.9  7.0  7.1   Hemoglobin 13.0 - 17.0 g/dL 9.7  89.7  9.8   Hematocrit 39.0 - 52.0 % 28.4  29.4  28.2   Platelets 150 - 400 K/uL 234  229  231        Latest Ref Rng & Units 04/21/2024    5:22 AM 04/20/2024    4:21 AM 04/19/2024    5:49 PM  CMP  Glucose 70 - 99 mg/dL 86  866    BUN 6 - 20 mg/dL 88  81    Creatinine 9.38 - 1.24 mg/dL 85.19  86.79  81.79   Sodium 135 - 145 mmol/L 134  136    Potassium 3.5 - 5.1 mmol/L 4.4  3.8    Chloride 98 - 111 mmol/L 90  91    CO2 22 - 32 mmol/L 20  21    Calcium  8.9 - 10.3 mg/dL 8.3  8.4      DG Chest Portable 1 View Result Date: 04/19/2024 CLINICAL DATA:  Short of breath, nausea and vomiting EXAM: PORTABLE CHEST 1 VIEW COMPARISON:  10/31/2023 FINDINGS: The heart size and mediastinal contours are within normal limits. Both lungs are clear. The visualized skeletal structures are  unremarkable. IMPRESSION: No active disease. Electronically Signed   By: Ozell Daring M.D.   On: 04/19/2024 15:53     Discharge Instructions: Discharge Instructions     Call MD for:  difficulty breathing, headache or visual disturbances   Complete by: As directed    Call MD for:  extreme fatigue   Complete by: As directed    Call MD for:  hives   Complete by: As directed    Call MD for:  persistant dizziness or light-headedness   Complete by: As directed    Call MD for:  persistant nausea and vomiting   Complete by: As directed    Call MD for:  severe uncontrolled pain   Complete by: As directed    Call MD for:  temperature >100.4   Complete by: As directed    Discharge instructions   Complete by: As directed    Thank you for allowing us  to be part of your care. You were hospitalized for nausea and vomiting and missed dialysis. We treated you with dialysis and nausea medications.   See the changes in your medications and management of your chronic conditions below:  *For your Nausea  -We have sent you home with zofran  and capsaicin  cream. I also sent you with haldol  (for nausea) to take every 6 hours as needed if the zofran  does not help until you are seen in clinic. Please still call clinic if  your nausea is not well controlled or you are unable to tolerate any food or water  -Stop smoking marijuana -Follow up with the clinic and GI doctor.   *For your diarrhea  - Increase your diet as tolerated  - you can take imodium (over the counter) as needed and follow up with the clinic.   *For your Blood Pressure - Take Carvedilol  25 mg twice daily and follow up in the clinic. It appears you tolerated this well while here, but if you have any dizziness or lightheadedness, go back to your 12.5 mg twice daily and call the clinic.   *For your Chronic Pain  Discuss during your hospital follow up trying to taper down off of the hydrocodone -acetaminophen .   FOLLOW UP APPOINTMENTS: We  arranged for you to follow up with the clinic on 1/22  Call [facility] to see if they can see you sooner prior to your endoscopy.   Please call your PCP or our clinic if you have any questions or concerns, we may be able to help and keep you from a long and expensive emergency room wait. Our clinic and after hours phone number is [phone]. The best time to call is Monday through Friday 9 am to 4 pm but there is always someone available 24/7 if you have an emergency. If you need medication refills please notify your pharmacy one week in advance and they will send us  a request.   We are glad you are feeling better,  Sallyanne Primas Internal Medicine Inpatient Teaching Service at [facility]   Increase activity slowly   Complete by: As directed    No wound care   Complete by: As directed        Signed: Primas Sallyanne, DO 04/21/2024, 2:28 PM     "

## 2024-04-21 NOTE — Progress Notes (Signed)
" °   04/21/24 1141  Vitals  Temp 98.8 F (37.1 C)  Pulse Rate 90  Resp 13  BP (!) 143/74  SpO2 100 %  O2 Device Nasal Cannula  Oxygen Therapy  O2 Flow Rate (L/min) 2 L/min  Patient Activity (if Appropriate) In bed  Pulse Oximetry Type Continuous  During Treatment Monitoring  Blood Flow Rate (mL/min) 300 mL/min  Arterial Pressure (mmHg) -67.67 mmHg  Venous Pressure (mmHg) 301.6 mmHg  TMP (mmHg) 7.07 mmHg  Ultrafiltration Rate (mL/min) 0 mL/min  Dialysate Flow Rate (mL/min) 300 ml/min  Duration of HD Treatment -hour(s) 3.46 hour(s)  Cumulative Fluid Removed (mL) per Treatment  -46.34  HD Safety Checks Performed Yes  Intra-Hemodialysis Comments Progressing as prescribed    "

## 2024-04-21 NOTE — Progress Notes (Signed)
 Pt returned back to 6N22 from HD, A&O X 4 with no complaints of pain. Pt c/o nausea and chest pain in HD (per HD nurse in report), given PRN Zofran  for nausea (per [facility]) and placed on 2 L nasal cannula. Pt still c/o nausea as of now, No complaints of chest pain at this time, O2 removed, pt now on RA and will reassess for need of 02. MD made aware.

## 2024-04-21 NOTE — Progress Notes (Signed)
 Pt. Came in awake and oriented with no complaints Consent verified and on file. Started with no complaints  UF removal: 0ml Tx duration: 3.5 hours  Access used: Left AVF Access issue: None   Almost at the end of tx, pt. Complained of on and off chest discomfort. Put on 02 at 2LPM via nasal cannula.  Tx completed. Pressure dressing applied Endorsed to floor nurse. Transported to room  Estanislao Auther Shope, RN Kid[facility] Care Unit

## 2024-04-21 NOTE — Progress Notes (Signed)
 Patient has been discharged per MD order, IV removed, tolerated well. AVS instructions reviewed and verbalized understanding. TOC medications picked up prior to discharging.

## 2024-04-21 NOTE — Care Management Obs Status (Signed)
 MEDICARE OBSERVATION STATUS NOTIFICATION   Patient Details  Name: [name] MRN: [id number] Date of Birth: 1965/08/11   Medicare Observation Status Notification Given:  Yes    [name] 04/21/2024, 12:37 PM

## 2024-04-22 NOTE — Discharge Planning (Signed)
 [facility] Patient Discharge Orders- [facility]IC: [place]  Patient's name: [name] Admit/DC Dates: 04/19/2024 - 04/21/2024  Discharge Diagnoses: Nausea/vomiting:  2/2 cannabinoid hyperemesis syndrome, has EGD scheduled in Feb, on Protonix  daily Insomnia/anxiety   Aranesp: Given: No    Last Hgb: 9.7 PRBC's Given: No ESA dose for discharge: Resume mircera 75 mcg IV q 2 weeks  IV Iron dose at discharge: N/A  Heparin  change: No  EDW Change: Yes New EDW: Raise EDW further to 93kg. He endorses weight gain. C/o nauseous and dizziness here. CXR (-) and euvolemic on exam.  Bath Change: No  Access intervention/Change: No  Calcitriol  change: No  Discharge Labs: Calcium  8.3  Phosphorus 12.1  Albumin 3.3  K+ 4.4  IV Antibiotics: No  On Coumadin?: No   OTHER/APPTS/LAB ORDERS:    D/C Meds to be reconciled by nurse after every discharge.  Completed By: Charmaine Piety, NP   Reviewed by: MD:______ RN_______

## 2024-04-24 ENCOUNTER — Telehealth: Payer: Self-pay

## 2024-04-24 NOTE — Transitions of Care (Post Inpatient/ED Visit) (Unsigned)
" ° °  04/24/2024  Name: [name] MRN: [id number] DOB: 09/13/1965  Today's TOC FU Call Status: Today's TOC FU Call Status:: Unsuccessful Call (1st Attempt) Unsuccessful Call (1st Attempt) Date: 04/24/24  Attempted to reach the patient regarding the most recent Inpatient/ED visit.  Follow Up Plan: Additional outreach attempts will be made to reach the patient to complete the Transitions of Care (Post Inpatient/ED visit) call.   Signature  Charmaine Bloodgood, LPN [facility] Health Advisor [place] [facility] You Are. We Are. [facility] Direct Dial [phone]  "

## 2024-04-25 NOTE — Transitions of Care (Post Inpatient/ED Visit) (Unsigned)
" ° °  04/25/2024  Name: [name] MRN: [id number] DOB: 1965-08-05  Today's TOC FU Call Status: Today's TOC FU Call Status:: Unsuccessful Call (2nd Attempt) Unsuccessful Call (1st Attempt) Date: 04/24/24 Unsuccessful Call (2nd Attempt) Date: 04/25/24  Attempted to reach the patient regarding the most recent Inpatient/ED visit.  Follow Up Plan: Additional outreach attempts will be made to reach the patient to complete the Transitions of Care (Post Inpatient/ED visit) call.   Signature Julian Lemmings, LPN [facility] Nurse Health Advisor Direct Dial [phone]  "

## 2024-04-26 NOTE — Transitions of Care (Post Inpatient/ED Visit) (Signed)
" ° °  04/26/2024  Name: [name] MRN: [id number] DOB: 09-Jan-1966  Today's TOC FU Call Status: Today's TOC FU Call Status:: Unsuccessful Call (3rd Attempt) Unsuccessful Call (1st Attempt) Date: 04/24/24 Unsuccessful Call (2nd Attempt) Date: 04/25/24 Unsuccessful Call (3rd Attempt) Date: 04/26/24  Attempted to reach the patient regarding the most recent Inpatient/ED visit.  Follow Up Plan: No further outreach attempts will be made at this time. We have been unable to contact the patient.  Signature Julian Lemmings, LPN [facility] Nurse Health Advisor Direct Dial [phone]  "

## 2024-04-27 ENCOUNTER — Ambulatory Visit: Payer: Self-pay | Admitting: Student

## 2024-04-27 NOTE — Progress Notes (Unsigned)
 "  CC: ***  HPI:  [name] a 59 y.o. male with past medical history of HFpEF, hypertension, chronic hepatitis C, type 2 diabetes who presents for hospital follow-up appointment.  Please see assessment and plan for full HPI.  Medications: Arthritis: Voltaren  gel, capsaicin  cream Depression: Zoloft  100 mg daily, Cymbalta  20 mg daily, Haldol  0.5 mg every 6 hours. GERD: Protonix  40 mg daily Hyperlipidemia: Atorvastatin  40 mg daily Diabetes type 2: Lantus  20 dose daily HFpEF: Carvedilol  25 mg twice daily  ESRD: Cinacalcet , sevelamer  1600-2400 mg 3 times daily Neuropathy: Gabapentin  100 mg nightly Pain: Norco 3 tablets as needed 5 mg Iron deficiency: Iron sucrose  Nausea: Zofran  4 mg every 8 hours as needed  Patient recently admitted from 01/14-0 1/16 for nausea and vomiting.  Patient had 8 days of nausea and vomiting.  Did not think this was related to infectious disease.  This was likely related to marijuana and cannabinoid hyperemesis syndrome.  Patient progressed well during hospitalization.  Was given Haldol  and capsaicin  cream.  Patient also had emesis with CT findings suggestive of esophagitis.  Patient already has EGD scheduled for February.  Nausea/vomiting: Assess Insomnia/anxiety: Was able to SSRIs? Hypertension patient discharged with carvedilol  25 mg twice daily, says as needed ESRD: Monitor Patient has chronic pain on Vicodin.  Labs: 01/16 CBC: Hemoglobin 9.7, white count 4.9, platelets 234 RFP: Sodium 134, CO2 20, creatinine 14.8  Past Medical History:  Diagnosis Date   Acute exacerbation of CHF (congestive heart failure) (HCC) 12/31/2021   Anxiety    Panic Atacks   Arthritis    Carpal tunnel syndrome    Chronic kidney disease    Constipation    Depression    Diabetes mellitus    DMII   Dysrhythmia    hx tachy hr   GERD (gastroesophageal reflux disease)    no problem in past few years.   Heart disease    Heart murmur    Hepatitis     Hyperlipidemia    Hypertension    Nausea and vomiting 11/12/2022   Neuropathy associated with endocrine disorder    Seizures (HCC)    stress related no more since taking Klonopin  2 years- See Neurologist  [facility]   Shortness of breath dyspnea    with exertion   Sleep apnea    used a cpap when he was 350lb-does not need one now 260lb   Wears contact lenses     Current Medications[1]  Review of Systems:  ***  Constitutional: Eye: Respiratory: Cardiovascular: GI: MSK: GU: Skin: Neuro: Endocrine:   Physical Exam:  There were no vitals filed for this visit. *** General: Patient is sitting comfortably in the room  Eyes: Pupils equal and reactive to light, EOM intact  Head: Normocephalic, atraumatic  Neck: Supple, nontender, full range of motion, No JVD Cardio: Regular rate and rhythm, no murmurs, rubs or gallops. 2+ pulses to bilateral upper and lower extremities  Chest: No chest tenderness Pulmonary: Clear to ausculation bilaterally with no rales, rhonchi, and crackles  Abdomen: Soft, nontender with normoactive bowel sounds with no rebound or guarding  Neuro: Alert and orientated x3. CN II-XII intact. Sensation intact to upper and lower extremities. 2+ patellar reflex.  Back: No midline tenderness, no step off or deformities noted. No paraspinal muscle tenderness.  Skin: No rashes noted  MSK: 5/5 strength to upper and lower extremities.    Assessment & Plan:   Assessment & Plan      Patient {GC/[id number]::discussed with,seen with}  Dr. {[id number]::Rylw,Ynqqfjw,Ryjfaopdd,Fjryzw,Tpoopjfd,Tpwqmzb}  Libby Blanch, DO Internal Medicine Resident PGY-3    [1]  Current Outpatient Medications:    atorvastatin  (LIPITOR) 40 MG tablet, Take 1 tablet (40 mg total) by mouth daily., Disp: 90 tablet, Rfl: 2   CALCITRIOL  PO, Take by mouth., Disp: , Rfl:    capsicum (ZOSTRIX) 0.075 % topical cream, Apply topically 2 (two) times daily., Disp: 28.3 g, Rfl:  0   carvedilol  (COREG ) 25 MG tablet, Take 1 tablet (25 mg total) by mouth 2 (two) times daily with a meal., Disp: 60 tablet, Rfl: 0   Cinacalcet  HCl (SENSIPAR  PO), Take by mouth., Disp: , Rfl:    Continuous Glucose Receiver (FREESTYLE LIBRE 3 READER) DEVI, Use with freestyle libre 3 plus sensors to monitor your blood sugar continuously, Disp: 1 each, Rfl: 1   Continuous Glucose Sensor (FREESTYLE LIBRE 3 PLUS SENSOR) MISC, Change sensor every 15 days., Disp: 6 each, Rfl: 3   diclofenac  Sodium (VOLTAREN ) 1 % GEL, Apply 4 g topically 4 (four) times daily., Disp: 150 g, Rfl: 3   DULoxetine  (CYMBALTA ) 20 MG capsule, TAKE 1 CAPSULE BY MOUTH DAILY, Disp: 30 capsule, Rfl: 0   gabapentin  (NEURONTIN ) 100 MG capsule, Take 1 capsule (100 mg total) by mouth at bedtime., Disp: 90 capsule, Rfl: 0   haloperidol  (HALDOL ) 0.5 MG tablet, Take 1 tablet (0.5 mg total) by mouth every 6 (six) hours as needed (for nausea and vomiting)., Disp: 8 tablet, Rfl: 0   HYDROcodone -acetaminophen  (NORCO/VICODIN) 5-325 MG tablet, Take 3 tablets by mouth as needed for severe pain (pain score 7-10) or moderate pain (pain score 4-6)., Disp: , Rfl:    Insulin  Pen Needle (PEN NEEDLES) 32G X 4 MM MISC, 1 Needle by Does not apply route daily at 6 (six) AM., Disp: 200 each, Rfl: 1   iron sucrose  (VENOFER ) 20 MG/ML injection, Inject into the vein one time in dialysis., Disp: , Rfl:    LANTUS  SOLOSTAR 100 UNIT/ML Solostar Pen, Inject 20 Units into the skin daily. (Patient taking differently: Inject 20 Units into the skin at bedtime.), Disp: 15 mL, Rfl: 1   Nutritional Supplements (FEEDING SUPPLEMENT, NEPRO CARB STEADY,) LIQD, Take 237 mLs by mouth daily as needed., Disp: , Rfl:    ondansetron  (ZOFRAN ) 4 MG tablet, Take 1 tablet (4 mg total) by mouth every 8 (eight) hours as needed for nausea or vomiting., Disp: 20 tablet, Rfl: 0   pantoprazole  (PROTONIX ) 20 MG tablet, Take 2 tablets (40 mg total) by mouth daily., Disp: 60 tablet, Rfl: 0    [Paused] prochlorperazine  (COMPAZINE ) 5 MG tablet, Take 1 tablet (5 mg total) by mouth every 6 (six) hours as needed for nausea or vomiting., Disp: 30 tablet, Rfl: 6   sertraline  (ZOLOFT ) 100 MG tablet, TAKE 1 TABLET BY MOUTH DAILY, Disp: 30 tablet, Rfl: 0   sevelamer  carbonate (RENVELA ) 800 MG tablet, Take 1,600-2,400 mg by mouth 3 (three) times daily with meals. 800 mg -1600 mg with snack, Disp: , Rfl:   "

## 2024-05-12 ENCOUNTER — Encounter (HOSPITAL_COMMUNITY): Payer: Self-pay | Admitting: Vascular Surgery

## 2024-05-23 ENCOUNTER — Encounter

## 2024-06-08 ENCOUNTER — Ambulatory Visit (HOSPITAL_COMMUNITY): Admit: 2024-06-08 | Admitting: Internal Medicine

## 2024-06-08 ENCOUNTER — Encounter (HOSPITAL_COMMUNITY): Payer: Self-pay

## 2024-06-08 SURGERY — COLONOSCOPY
Anesthesia: Monitor Anesthesia Care
